# Patient Record
Sex: Female | Born: 1985
Health system: Southern US, Community
[De-identification: ages and names within clinical notes are randomized; demographics above are authoritative.]

## PROBLEM LIST (undated history)

## (undated) DIAGNOSIS — Z975 Presence of (intrauterine) contraceptive device: Secondary | ICD-10-CM

## (undated) DIAGNOSIS — F329 Major depressive disorder, single episode, unspecified: Secondary | ICD-10-CM

## (undated) DIAGNOSIS — M25561 Pain in right knee: Secondary | ICD-10-CM

## (undated) DIAGNOSIS — T7840XA Allergy, unspecified, initial encounter: Secondary | ICD-10-CM

## (undated) DIAGNOSIS — T4145XA Adverse effect of unspecified anesthetic, initial encounter: Secondary | ICD-10-CM

## (undated) DIAGNOSIS — K5120 Ulcerative (chronic) proctitis without complications: Secondary | ICD-10-CM

## (undated) DIAGNOSIS — T8859XA Other complications of anesthesia, initial encounter: Secondary | ICD-10-CM

## (undated) DIAGNOSIS — T8489XA Other specified complication of internal orthopedic prosthetic devices, implants and grafts, initial encounter: Secondary | ICD-10-CM

## (undated) DIAGNOSIS — Z8489 Family history of other specified conditions: Secondary | ICD-10-CM

## (undated) DIAGNOSIS — G43909 Migraine, unspecified, not intractable, without status migrainosus: Secondary | ICD-10-CM

## (undated) DIAGNOSIS — F32A Depression, unspecified: Secondary | ICD-10-CM

## (undated) DIAGNOSIS — F419 Anxiety disorder, unspecified: Secondary | ICD-10-CM

## (undated) DIAGNOSIS — T783XXA Angioneurotic edema, initial encounter: Secondary | ICD-10-CM

## (undated) DIAGNOSIS — G478 Other sleep disorders: Secondary | ICD-10-CM

## (undated) HISTORY — DX: Ulcerative (chronic) proctitis without complications: K51.20

## (undated) HISTORY — DX: Migraine, unspecified, not intractable, without status migrainosus: G43.909

## (undated) HISTORY — PX: OTHER SURGICAL HISTORY: SHX169

## (undated) HISTORY — DX: Major depressive disorder, single episode, unspecified: F32.9

## (undated) HISTORY — DX: Anxiety disorder, unspecified: F41.9

## (undated) HISTORY — DX: Angioneurotic edema, initial encounter: T78.3XXA

## (undated) HISTORY — PX: KNEE ARTHROSCOPY W/ ACL RECONSTRUCTION: SHX1858

## (undated) HISTORY — PX: SPINAL FUSION: SHX223

## (undated) HISTORY — DX: Depression, unspecified: F32.A

## (undated) HISTORY — DX: Allergy, unspecified, initial encounter: T78.40XA

---

## 2004-02-27 HISTORY — PX: KNEE ARTHROSCOPY W/ ACL RECONSTRUCTION: SHX1858

## 2005-05-06 ENCOUNTER — Emergency Department: Payer: Self-pay | Admitting: General Practice

## 2010-05-15 ENCOUNTER — Ambulatory Visit (INDEPENDENT_AMBULATORY_CARE_PROVIDER_SITE_OTHER): Payer: Self-pay | Admitting: Internal Medicine

## 2010-05-15 ENCOUNTER — Encounter: Payer: Self-pay | Admitting: Internal Medicine

## 2010-05-15 DIAGNOSIS — G43909 Migraine, unspecified, not intractable, without status migrainosus: Secondary | ICD-10-CM

## 2010-05-15 DIAGNOSIS — B3731 Acute candidiasis of vulva and vagina: Secondary | ICD-10-CM

## 2010-05-15 DIAGNOSIS — B373 Candidiasis of vulva and vagina: Secondary | ICD-10-CM

## 2010-05-15 DIAGNOSIS — J329 Chronic sinusitis, unspecified: Secondary | ICD-10-CM

## 2010-05-15 MED ORDER — METHYLPREDNISOLONE (PAK) 4 MG PO TABS: 4.0000 mg | ORAL_TABLET | Freq: Every day | ORAL | Status: DC

## 2010-05-15 MED ORDER — FLUCONAZOLE 100 MG PO TABS: 100.0000 mg | ORAL_TABLET | Freq: Every day | ORAL | Status: AC

## 2010-05-15 MED ORDER — BROMPHENIRAMINE-PSEUDOEPH 4-60 MG PO TABS: 1.0000 | ORAL_TABLET | Freq: Four times a day (QID) | ORAL | Status: AC | PRN

## 2010-05-15 MED ORDER — LEVOFLOXACIN 500 MG PO TABS: 500.0000 mg | ORAL_TABLET | Freq: Every day | ORAL | Status: AC

## 2010-05-15 NOTE — Progress Notes (Signed)
  Subjective:    Patient ID: [name], female    DOB: 1985/09/26, 25 y.o.   MRN: [id number]  Sinusitis This is a chronic problem. The current episode started more than 1 month ago. The problem has been gradually worsening since onset. The maximum temperature recorded prior to her arrival was 100 - 100.9 F. The fever has been present for 5 days or more. Her pain is at a severity of 8/10. The pain is moderate. Associated symptoms include congestion, coughing, ear pain, headaches, a hoarse voice, neck pain, shortness of breath, sinus pressure, a sore throat and swollen glands. Past treatments include antibiotics, oral decongestants and saline sprays. The treatment provided mild relief.      Review of Systems  Constitutional: Positive for fever and fatigue.  HENT: Positive for ear pain, congestion, sore throat, hoarse voice, neck pain, sinus pressure and tinnitus.   Eyes: Negative.   Respiratory: Positive for cough and shortness of breath.   Musculoskeletal: Negative for myalgias and arthralgias.  Neurological: Positive for headaches.  Hematological: Positive for adenopathy. Does not bruise/bleed easily.      Past Medical History  Diagnosis Date  . Headache   . Allergy   . Migraine headache    Past Surgical History  Procedure Date  . Knee arthroscopy w/ acl reconstruction     reports that she has never smoked. She does not have any smokeless tobacco history on file. She reports that she drinks alcohol. She reports that she does not use illicit drugs. family history includes Goiter in her father. No Known Allergies   Objective:   Physical Exam  Nursing note and vitals reviewed. HENT:  Head: Normocephalic and atraumatic.        The patient's turbinates are purple and swollen she has marked airflow obstruction bilaterally posterior pharynx shows redness and cobblestoning there is marked erythema or drainage occurs in the back of the throat is moderate adenopathy  Eyes:  Conjunctivae are normal. Pupils are equal, round, and reactive to light.  Neck: Normal range of motion.  Cardiovascular: Normal rate, regular rhythm and normal heart sounds.   Pulmonary/Chest: Effort normal and breath sounds normal.          Assessment & Plan:   I believe she has a complication of chronic sinusitis with also chronic allergic rhinitis we will have to treat this with triple therapy.  I believe she should be on an antibiotic for 14 days a different class and she's been on before apparently she has been on the penicillin class twice and should be on a broader spectrum so we will use Levaquin 500 mg by mouth daily for 14 days we will pare this with a steroid burst and taper she'll have a shot of Depo-Medrol today and we will repair this with a powerfull deconjestant tool

## 2010-05-16 ENCOUNTER — Telehealth: Payer: Self-pay | Admitting: Internal Medicine

## 2010-05-16 NOTE — Telephone Encounter (Signed)
Pharmacy called and said that Bromfed 4/60 mg is no longer made by manufacter, have the syrup but it is the DM.   Pls advise.

## 2010-05-16 NOTE — Assessment & Plan Note (Signed)
She in controlled on sumatriptan, she uses between 3-4 a month and the patter has been stable. She cannot related the pattern to her menses. The current flair may be related to the URI The medrol dose pack may aleiviate this flair

## 2010-05-16 NOTE — Telephone Encounter (Signed)
Use allegra d-pharmacy informed

## 2010-08-23 ENCOUNTER — Encounter: Payer: Self-pay | Admitting: Internal Medicine

## 2010-08-23 ENCOUNTER — Ambulatory Visit (INDEPENDENT_AMBULATORY_CARE_PROVIDER_SITE_OTHER): Payer: BC Managed Care – PPO | Admitting: Internal Medicine

## 2010-08-23 VITALS — BP 120/74 | HR 68 | Temp 98.2°F | Resp 14 | Ht 64.0 in | Wt 126.0 lb

## 2010-08-23 DIAGNOSIS — G5600 Carpal tunnel syndrome, unspecified upper limb: Secondary | ICD-10-CM

## 2010-08-23 DIAGNOSIS — G43909 Migraine, unspecified, not intractable, without status migrainosus: Secondary | ICD-10-CM

## 2010-08-23 DIAGNOSIS — G5603 Carpal tunnel syndrome, bilateral upper limbs: Secondary | ICD-10-CM

## 2010-08-23 DIAGNOSIS — R252 Cramp and spasm: Secondary | ICD-10-CM

## 2010-08-23 DIAGNOSIS — R5381 Other malaise: Secondary | ICD-10-CM

## 2010-08-23 DIAGNOSIS — R5383 Other fatigue: Secondary | ICD-10-CM

## 2010-08-23 DIAGNOSIS — M542 Cervicalgia: Secondary | ICD-10-CM

## 2010-08-23 LAB — CBC WITH DIFFERENTIAL/PLATELET
Basophils Absolute: 0 10*3/uL (ref 0.0–0.1)
Basophils Relative: 0.2 % (ref 0.0–3.0)
Eosinophils Absolute: 0.1 10*3/uL (ref 0.0–0.7)
Eosinophils Relative: 0.8 % (ref 0.0–5.0)
HCT: 37.5 % (ref 36.0–46.0)
Hemoglobin: 12.6 g/dL (ref 12.0–15.0)
Lymphocytes Relative: 19.2 % (ref 12.0–46.0)
Lymphs Abs: 1.7 10*3/uL (ref 0.7–4.0)
MCHC: 33.7 g/dL (ref 30.0–36.0)
MCV: 93.1 fl (ref 78.0–100.0)
Monocytes Absolute: 0.8 10*3/uL (ref 0.1–1.0)
Monocytes Relative: 9.3 % (ref 3.0–12.0)
Neutro Abs: 6.4 10*3/uL (ref 1.4–7.7)
Neutrophils Relative %: 70.5 % (ref 43.0–77.0)
Platelets: 300 10*3/uL (ref 150.0–400.0)
RBC: 4.03 Mil/uL (ref 3.87–5.11)
RDW: 12.6 % (ref 11.5–14.6)
WBC: 9.1 10*3/uL (ref 4.5–10.5)

## 2010-08-23 LAB — TSH: TSH: 1.01 u[IU]/mL (ref 0.35–5.50)

## 2010-08-23 MED ORDER — CYCLOBENZAPRINE HCL 10 MG PO TABS: 10.0000 mg | ORAL_TABLET | Freq: Three times a day (TID) | ORAL | Status: AC | PRN

## 2010-08-23 MED ORDER — SUMATRIPTAN SUCCINATE 100 MG PO TABS: 100.0000 mg | ORAL_TABLET | ORAL | Status: DC | PRN

## 2010-08-23 NOTE — Progress Notes (Signed)
Subjective:    Patient ID: [name], female    DOB: 1985-10-07, 25 y.o.   MRN: [id number]  HPI  The patient is a new patient who presented with acute migraines was treated with a prednisone Dosepak and did well her migraine pattern has been relatively stable on Imitrex.  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  A second unrelated problem is carpal tunnel on the right this has been a chronic problem for which she is seeing a chiropractor in the past his 1021 treatments to the wrist these treatments have not helped she has never worn a cockup wrist brace she's never been evaluated by EMG and nerve conduction velocity. A third problem involves extreme fatigue this may be related to her lack of exercise or working 2 jobs and stress but we will monitor a TSH and a CBC today she is demonstrating female it could have anemia  Review of Systems  Constitutional: Negative for activity change, appetite change and fatigue.  HENT: Negative for ear pain, congestion, neck pain, postnasal drip and sinus pressure.   Eyes: Negative for redness and visual disturbance.  Respiratory: Negative for cough, shortness of breath and wheezing.   Gastrointestinal: Negative for abdominal pain and abdominal distention.  Genitourinary: Negative for dysuria, frequency and menstrual problem.  Musculoskeletal: Negative for myalgias, joint swelling and arthralgias.  Skin: Negative for rash and wound.  Neurological: Negative for dizziness, weakness and headaches.  Hematological: Negative for adenopathy. Does not bruise/bleed easily.  Psychiatric/Behavioral: Negative for sleep disturbance and decreased concentration.   Past Medical  History  Diagnosis Date  . Headache   . Allergy   . Migraine headache    Past Surgical History  Procedure Date  . Knee arthroscopy w/ acl reconstruction     reports that she has never smoked. She does not have any smokeless tobacco history on file. She reports that she drinks alcohol. She reports that she does not use illicit drugs. family history includes Goiter in her father. No Known Allergies     Objective:   Physical Exam  Constitutional: She is oriented to person, place, and time. She appears well-developed and well-nourished. No distress.  HENT:  Head: Normocephalic and atraumatic.  Right Ear: External ear normal.  Left Ear: External ear normal.  Nose: Nose normal.  Mouth/Throat: Oropharynx is clear and moist.  Eyes: Conjunctivae and EOM are normal. Pupils are equal, round, and reactive to light.  Neck: Normal range of motion. Neck supple. No JVD present. No tracheal deviation present. No thyromegaly present.  Cardiovascular: Normal rate, regular rhythm, normal heart sounds and intact distal pulses.   No murmur heard. Pulmonary/Chest: Effort normal and breath sounds normal. She has no wheezes. She exhibits no tenderness.  Abdominal: Soft. Bowel sounds are normal.  Musculoskeletal: She exhibits no edema and no tenderness.       Tenderness in the right wrist area with positive Tinel's  Lymphadenopathy:    She has no cervical adenopathy.  Neurological: She is alert and oriented to person, place, and time. She has normal reflexes. No cranial nerve deficit.  Skin: Skin is warm and dry. She is not diaphoretic.  Psychiatric: She has a  normal mood and affect. Her behavior is normal.          Assessment & Plan:   Probable right carpal tunnel we'll refer for EMG and nerve conduction velocity have ordered a right cockup wrist brace.  Treatment options include wrist brace at night consideration for therapeutic injection and referral for surgical evaluation.  These were  discussed with the patient in detail the patient was given a muscle relaxant for her neck and prescribed exercises.  Patient's migraine medications were refilled a CBC and TSH will be drawn today for a workup of fatigue

## 2010-11-27 ENCOUNTER — Ambulatory Visit: Payer: BC Managed Care – PPO | Admitting: Internal Medicine

## 2011-01-30 ENCOUNTER — Telehealth: Payer: Self-pay | Admitting: *Deleted

## 2011-01-30 ENCOUNTER — Other Ambulatory Visit: Payer: Self-pay | Admitting: Internal Medicine

## 2011-01-30 MED ORDER — LEVOFLOXACIN 500 MG PO TABS: 500.0000 mg | ORAL_TABLET | Freq: Every day | ORAL | Status: AC

## 2011-01-30 NOTE — Telephone Encounter (Signed)
Pt is aware med call into pharm. Pt home # updated

## 2011-01-30 NOTE — Telephone Encounter (Signed)
Pt unable to come in due to sprain ankle. Pt was seen in march 2012. Pt has same symptoms chest congestion,cough and ear discomfort. Pt would like levaquin 580m call into [facility] [phone]

## 2011-01-30 NOTE — Telephone Encounter (Signed)
Per dr Lovell Sheehan- may have levaquin 500 qd for 7 days-meds sent in and pt informed

## 2011-01-30 NOTE — Telephone Encounter (Signed)
Unable to contact pt to tell her levaquil had been called in- number we have for her was the wrong number

## 2011-03-09 ENCOUNTER — Telehealth: Payer: Self-pay | Admitting: Internal Medicine

## 2011-03-09 NOTE — Telephone Encounter (Signed)
Pt stated that she had an issue at the pharmacy for her Imitrex that she picked up. She went on a trip and did not realized that her bottle was empty and was never filled. Please advise patient and return call to [phone]. Thanks.    [facility] ----[name].

## 2011-03-12 ENCOUNTER — Other Ambulatory Visit: Payer: Self-pay | Admitting: *Deleted

## 2011-03-12 MED ORDER — SUMATRIPTAN SUCCINATE 100 MG PO TABS: 100.0000 mg | ORAL_TABLET | ORAL | Status: DC | PRN

## 2011-03-12 NOTE — Telephone Encounter (Signed)
Needed refilll-sent in

## 2014-07-23 ENCOUNTER — Other Ambulatory Visit: Payer: Self-pay | Admitting: Orthopedic Surgery

## 2014-07-30 ENCOUNTER — Encounter (HOSPITAL_BASED_OUTPATIENT_CLINIC_OR_DEPARTMENT_OTHER): Payer: Self-pay | Admitting: *Deleted

## 2014-08-02 ENCOUNTER — Encounter (HOSPITAL_BASED_OUTPATIENT_CLINIC_OR_DEPARTMENT_OTHER): Payer: Self-pay | Admitting: *Deleted

## 2014-08-02 NOTE — Progress Notes (Signed)
Pt instructed npo pmn 6/13.  To [facility] 6/14 @ 0945.  Needs hgb, urine hcg on arrival.

## 2014-08-10 ENCOUNTER — Encounter (HOSPITAL_BASED_OUTPATIENT_CLINIC_OR_DEPARTMENT_OTHER)
Admission: RE | Disposition: A | Discharge status: Home / Self Care | Payer: Self-pay | Source: Ambulatory Visit | Attending: Specialist

## 2014-08-10 ENCOUNTER — Ambulatory Visit (HOSPITAL_BASED_OUTPATIENT_CLINIC_OR_DEPARTMENT_OTHER)
Admission: RE | Admit: 2014-08-10 | Discharge: 2014-08-10 | Disposition: A | Discharge status: Home / Self Care | Payer: 59 | Source: Ambulatory Visit | Attending: Specialist | Admitting: Specialist

## 2014-08-10 ENCOUNTER — Encounter (HOSPITAL_BASED_OUTPATIENT_CLINIC_OR_DEPARTMENT_OTHER): Payer: Self-pay | Admitting: Anesthesiology

## 2014-08-10 ENCOUNTER — Ambulatory Visit (HOSPITAL_BASED_OUTPATIENT_CLINIC_OR_DEPARTMENT_OTHER): Payer: 59 | Admitting: Anesthesiology

## 2014-08-10 DIAGNOSIS — T8589XA Other specified complication of internal prosthetic devices, implants and grafts, not elsewhere classified, initial encounter: Secondary | ICD-10-CM | POA: Diagnosis not present

## 2014-08-10 DIAGNOSIS — G43909 Migraine, unspecified, not intractable, without status migrainosus: Secondary | ICD-10-CM | POA: Diagnosis not present

## 2014-08-10 DIAGNOSIS — Y832 Surgical operation with anastomosis, bypass or graft as the cause of abnormal reaction of the patient, or of later complication, without mention of misadventure at the time of the procedure: Secondary | ICD-10-CM | POA: Diagnosis not present

## 2014-08-10 DIAGNOSIS — Y92234 Operating room of hospital as the place of occurrence of the external cause: Secondary | ICD-10-CM | POA: Diagnosis not present

## 2014-08-10 DIAGNOSIS — Z9889 Other specified postprocedural states: Secondary | ICD-10-CM

## 2014-08-10 DIAGNOSIS — T8489XA Other specified complication of internal orthopedic prosthetic devices, implants and grafts, initial encounter: Secondary | ICD-10-CM

## 2014-08-10 HISTORY — DX: Other complications of anesthesia, initial encounter: T88.59XA

## 2014-08-10 HISTORY — DX: Other specified complication of internal orthopedic prosthetic devices, implants and grafts, initial encounter: T84.89XA

## 2014-08-10 HISTORY — DX: Adverse effect of unspecified anesthetic, initial encounter: T41.45XA

## 2014-08-10 HISTORY — DX: Family history of other specified conditions: Z84.89

## 2014-08-10 HISTORY — DX: Other sleep disorders: G47.8

## 2014-08-10 HISTORY — PX: KNEE ARTHROSCOPY: SHX127

## 2014-08-10 HISTORY — DX: Pain in right knee: M25.561

## 2014-08-10 HISTORY — DX: Presence of (intrauterine) contraceptive device: Z97.5

## 2014-08-10 HISTORY — PX: ANTERIOR CRUCIATE LIGAMENT REPAIR: SHX115

## 2014-08-10 LAB — POCT HEMOGLOBIN-HEMACUE: Hemoglobin: 13 g/dL (ref 12.0–15.0)

## 2014-08-10 LAB — POCT PREGNANCY, URINE: Preg Test, Ur: NEGATIVE

## 2014-08-10 SURGERY — ARTHROSCOPY, KNEE
Anesthesia: Regional | Site: Knee | Laterality: Left

## 2014-08-10 MED ORDER — PROPOFOL 10 MG/ML IV BOLUS
INTRAVENOUS | Status: DC | PRN
  Administered 2014-08-10: 150 mg via INTRAVENOUS

## 2014-08-10 MED ORDER — ACETAMINOPHEN 10 MG/ML IV SOLN
INTRAVENOUS | Status: DC | PRN
  Administered 2014-08-10: 1000 mg via INTRAVENOUS

## 2014-08-10 MED ORDER — FENTANYL CITRATE (PF) 100 MCG/2ML IJ SOLN
INTRAMUSCULAR | Status: DC | PRN
  Administered 2014-08-10 (×8): 25 ug via INTRAVENOUS

## 2014-08-10 MED ORDER — MIDAZOLAM HCL 2 MG/2ML IJ SOLN
INTRAMUSCULAR | Status: AC
  Filled 2014-08-10: qty 2

## 2014-08-10 MED ORDER — ASPIRIN EC 325 MG PO TBEC: 325.0000 mg | DELAYED_RELEASE_TABLET | Freq: Two times a day (BID) | ORAL | Status: DC

## 2014-08-10 MED ORDER — OXYCODONE-ACETAMINOPHEN 5-325 MG PO TABS: 1.0000 | ORAL_TABLET | ORAL | Status: DC | PRN

## 2014-08-10 MED ORDER — HYDROMORPHONE HCL 1 MG/ML IJ SOLN
INTRAMUSCULAR | Status: AC
  Filled 2014-08-10: qty 1

## 2014-08-10 MED ORDER — FENTANYL CITRATE (PF) 100 MCG/2ML IJ SOLN
INTRAMUSCULAR | Status: AC
  Filled 2014-08-10: qty 6

## 2014-08-10 MED ORDER — MIDAZOLAM HCL 5 MG/5ML IJ SOLN
INTRAMUSCULAR | Status: DC | PRN
  Administered 2014-08-10 (×2): 1 mg via INTRAVENOUS

## 2014-08-10 MED ORDER — CEFAZOLIN SODIUM-DEXTROSE 2-3 GM-% IV SOLR
2.0000 g | INTRAVENOUS | Status: AC
  Administered 2014-08-10: 2 g via INTRAVENOUS
  Filled 2014-08-10: qty 50

## 2014-08-10 MED ORDER — FENTANYL CITRATE (PF) 100 MCG/2ML IJ SOLN
100.0000 ug | Freq: Once | INTRAMUSCULAR | Status: AC
  Administered 2014-08-10: 100 ug via INTRAVENOUS
  Filled 2014-08-10: qty 2

## 2014-08-10 MED ORDER — MIDAZOLAM HCL 2 MG/2ML IJ SOLN
INTRAMUSCULAR | Status: AC
  Filled 2014-08-10: qty 4

## 2014-08-10 MED ORDER — MORPHINE SULFATE 4 MG/ML IJ SOLN
INTRAMUSCULAR | Status: AC
  Filled 2014-08-10: qty 1

## 2014-08-10 MED ORDER — METHOCARBAMOL 500 MG PO TABS: 500.0000 mg | ORAL_TABLET | Freq: Four times a day (QID) | ORAL | Status: DC | PRN

## 2014-08-10 MED ORDER — FENTANYL CITRATE (PF) 100 MCG/2ML IJ SOLN
INTRAMUSCULAR | Status: AC
  Filled 2014-08-10: qty 2

## 2014-08-10 MED ORDER — SODIUM CHLORIDE 0.9 % IR SOLN
Status: DC | PRN
  Administered 2014-08-10: 6000 mL
  Administered 2014-08-10: 3000 mL
  Administered 2014-08-10: 6000 mL
  Administered 2014-08-10: 3000 mL
  Administered 2014-08-10: 6000 mL
  Administered 2014-08-10: 500 mL

## 2014-08-10 MED ORDER — POVIDONE-IODINE 7.5 % EX SOLN
Freq: Once | CUTANEOUS | Status: DC
  Filled 2014-08-10: qty 118

## 2014-08-10 MED ORDER — KETOROLAC TROMETHAMINE 30 MG/ML IJ SOLN
INTRAMUSCULAR | Status: DC | PRN
  Administered 2014-08-10: 30 mg via INTRAVENOUS

## 2014-08-10 MED ORDER — OXYCODONE HCL 5 MG PO TABS
ORAL_TABLET | ORAL | Status: AC
  Filled 2014-08-10: qty 1

## 2014-08-10 MED ORDER — CEFAZOLIN SODIUM-DEXTROSE 2-3 GM-% IV SOLR
INTRAVENOUS | Status: AC
  Filled 2014-08-10: qty 50

## 2014-08-10 MED ORDER — CEPHALEXIN 500 MG PO CAPS: 500.0000 mg | ORAL_CAPSULE | Freq: Three times a day (TID) | ORAL | Status: DC

## 2014-08-10 MED ORDER — LACTATED RINGERS IV SOLN
INTRAVENOUS | Status: DC
  Administered 2014-08-10 (×4): via INTRAVENOUS
  Filled 2014-08-10: qty 1000

## 2014-08-10 MED ORDER — BUPIVACAINE HCL 0.25 % IJ SOLN
INTRAMUSCULAR | Status: DC | PRN
  Administered 2014-08-10: 20 mL
  Administered 2014-08-10: 10 mL

## 2014-08-10 MED ORDER — DEXAMETHASONE SODIUM PHOSPHATE 10 MG/ML IJ SOLN
INTRAMUSCULAR | Status: DC | PRN
  Administered 2014-08-10: 10 mg via INTRAVENOUS

## 2014-08-10 MED ORDER — HYDROMORPHONE HCL 1 MG/ML IJ SOLN
0.2500 mg | INTRAMUSCULAR | Status: DC | PRN
  Administered 2014-08-10: 0.25 mg via INTRAVENOUS
  Administered 2014-08-10: 0.5 mg via INTRAVENOUS
  Administered 2014-08-10: 0.25 mg via INTRAVENOUS
  Filled 2014-08-10: qty 1

## 2014-08-10 MED ORDER — MIDAZOLAM HCL 2 MG/2ML IJ SOLN
2.0000 mg | Freq: Once | INTRAMUSCULAR | Status: AC
  Administered 2014-08-10: 2 mg via INTRAVENOUS
  Filled 2014-08-10: qty 2

## 2014-08-10 MED ORDER — ROPIVACAINE HCL 5 MG/ML IJ SOLN
INTRAMUSCULAR | Status: DC | PRN
  Administered 2014-08-10: 25 mL via PERINEURAL

## 2014-08-10 MED ORDER — LIDOCAINE HCL (CARDIAC) 20 MG/ML IV SOLN
INTRAVENOUS | Status: DC | PRN
  Administered 2014-08-10: 60 mg via INTRAVENOUS

## 2014-08-10 MED ORDER — MORPHINE SULFATE 4 MG/ML IJ SOLN
INTRAMUSCULAR | Status: DC | PRN
  Administered 2014-08-10: 4 mg via SUBCUTANEOUS

## 2014-08-10 MED ORDER — STERILE WATER FOR IRRIGATION IR SOLN
Status: DC | PRN
  Administered 2014-08-10: 500 mL

## 2014-08-10 MED ORDER — ONDANSETRON HCL 4 MG/2ML IJ SOLN
INTRAMUSCULAR | Status: DC | PRN
  Administered 2014-08-10: 4 mg via INTRAVENOUS

## 2014-08-10 MED ORDER — OXYCODONE HCL 5 MG PO TABS
5.0000 mg | ORAL_TABLET | Freq: Once | ORAL | Status: AC
Start: 2014-08-10 — End: 2014-08-10
  Administered 2014-08-10: 5 mg via ORAL
  Filled 2014-08-10: qty 1

## 2014-08-10 SURGICAL SUPPLY — 74 items
ALLOGRAFT GRAFTLINK CONVENIENCE PACK ×2 IMPLANT
ANCHOR BUTTON TIGHTROPE BTB (Anchor) ×2 IMPLANT
ANCHOR BUTTON TIGHTROPE RN 14 (Anchor) ×2 IMPLANT
ANCHOR PUSHLOCK PEEK 3.5X19.5 (Anchor) ×2 IMPLANT
BANDAGE ESMARK 6X9 LF (GAUZE/BANDAGES/DRESSINGS) ×1 IMPLANT
BLADE 4.2CUDA (BLADE) IMPLANT
BLADE CUDA 5.5 (BLADE) ×2 IMPLANT
BLADE CUDA GRT WHITE 3.5 (BLADE) ×2 IMPLANT
BLADE GREAT WHITE 4.2 (BLADE) IMPLANT
BLADE SURG 10 STRL SS (BLADE) ×2 IMPLANT
BLADE SURG 15 STRL LF DISP TIS (BLADE) ×1 IMPLANT
BLADE SURG 15 STRL SS (BLADE) ×2
BNDG ESMARK 6X9 LF (GAUZE/BANDAGES/DRESSINGS) ×2
BNDG GAUZE ELAST 4 BULKY (GAUZE/BANDAGES/DRESSINGS) ×2 IMPLANT
BONE MATRIX DEMINERALIZED 1CC (Bone Implant) ×8 IMPLANT
BUR OVAL 6.0 (BURR) IMPLANT
BUR VERTEX HOODED 4.5 (BURR) ×2 IMPLANT
CANISTER SUCT LVC 12 LTR MEDI- (MISCELLANEOUS) IMPLANT
CANISTER SUCTION 2500CC (MISCELLANEOUS) IMPLANT
CLOTH BEACON ORANGE TIMEOUT ST (SAFETY) ×2 IMPLANT
COVER BACK TABLE 60X90IN (DRAPES) ×2 IMPLANT
CUFF TOURNIQUET SINGLE 34IN LL (TOURNIQUET CUFF) ×2 IMPLANT
CUTTER FLIP 10.5 (CUTTER) ×2 IMPLANT
DRAPE ARTHROSCOPY W/POUCH 114 (DRAPES) ×2 IMPLANT
DRAPE INCISE IOBAN 66X45 STRL (DRAPES) ×2 IMPLANT
DRAPE LG THREE QUARTER DISP (DRAPES) ×4 IMPLANT
DRAPE OEC MINIVIEW 54X84 (DRAPES) ×2 IMPLANT
DRAPE U-SHAPE 47X51 STRL (DRAPES) ×2 IMPLANT
DRSG PAD ABDOMINAL 8X10 ST (GAUZE/BANDAGES/DRESSINGS) ×2 IMPLANT
DURAPREP 26ML APPLICATOR (WOUND CARE) ×2 IMPLANT
ELECT REM PT RETURN 9FT ADLT (ELECTROSURGICAL) ×2
ELECTRODE REM PT RTRN 9FT ADLT (ELECTROSURGICAL) ×1 IMPLANT
GAUZE XEROFORM 1X8 LF (GAUZE/BANDAGES/DRESSINGS) ×2 IMPLANT
GLOVE BIO SURGEON STRL SZ7.5 (GLOVE) ×2 IMPLANT
GLOVE BIO SURGEON STRL SZ8 (GLOVE) ×4 IMPLANT
GLOVE INDICATOR 8.0 STRL GRN (GLOVE) ×4 IMPLANT
GOWN STRL REUS W/ TWL LRG LVL3 (GOWN DISPOSABLE) IMPLANT
GOWN STRL REUS W/ TWL XL LVL3 (GOWN DISPOSABLE) ×2 IMPLANT
GOWN STRL REUS W/TWL LRG LVL3 (GOWN DISPOSABLE) ×2 IMPLANT
GOWN STRL REUS W/TWL XL LVL3 (GOWN DISPOSABLE) ×4
IV NS IRRIG 3000ML ARTHROMATIC (IV SOLUTION) ×16 IMPLANT
KIT BIOCARTILAGE DEL W/SYRINGE (KITS) ×2 IMPLANT
KNEE WRAP E Z 3 GEL PACK (MISCELLANEOUS) ×2 IMPLANT
MANIFOLD NEPTUNE II (INSTRUMENTS) ×2 IMPLANT
MIXING AND DELIVERY SYSTEM ×2 IMPLANT
NEEDLE HYPO 22GX1.5 SAFETY (NEEDLE) ×2 IMPLANT
PACK ARTHROSCOPY DSU (CUSTOM PROCEDURE TRAY) ×2 IMPLANT
PACK BASIN DAY SURGERY FS (CUSTOM PROCEDURE TRAY) ×2 IMPLANT
PAD ABD 8X10 STRL (GAUZE/BANDAGES/DRESSINGS) ×4 IMPLANT
PAD ARMBOARD 7.5X6 YLW CONV (MISCELLANEOUS) ×6 IMPLANT
PADDING CAST ABS 4INX4YD NS (CAST SUPPLIES)
PADDING CAST ABS COTTON 4X4 ST (CAST SUPPLIES) IMPLANT
PENCIL BUTTON HOLSTER BLD 10FT (ELECTRODE) ×2 IMPLANT
SET ARTHROSCOPY TUBING (MISCELLANEOUS) ×2
SET ARTHROSCOPY TUBING LN (MISCELLANEOUS) ×1 IMPLANT
SET PAD KNEE POSITIONER (MISCELLANEOUS) ×2 IMPLANT
SPONGE GAUZE 4X4 12PLY (GAUZE/BANDAGES/DRESSINGS) ×2 IMPLANT
SPONGE GAUZE 4X4 12PLY STER LF (GAUZE/BANDAGES/DRESSINGS) ×2 IMPLANT
SPONGE LAP 4X18 X RAY DECT (DISPOSABLE) ×2 IMPLANT
STRIP CLOSURE SKIN 1/2X4 (GAUZE/BANDAGES/DRESSINGS) IMPLANT
SUT ETHILON 4 0 PS 2 18 (SUTURE) ×4 IMPLANT
SUT MNCRL AB 3-0 PS2 18 (SUTURE) IMPLANT
SUT VIC AB 0 CT1 36 (SUTURE) IMPLANT
SUT VIC AB 0 CT2 27 (SUTURE) IMPLANT
SUT VIC AB 2-0 CT1 27 (SUTURE) ×2
SUT VIC AB 2-0 CT1 TAPERPNT 27 (SUTURE) ×1 IMPLANT
SUT VIC AB 2-0 PS2 27 (SUTURE) ×4 IMPLANT
SYR CONTROL 10ML LL (SYRINGE) ×2 IMPLANT
TISSUE GRAFTLINK FGL (Tissue) ×2 IMPLANT
TOWEL OR 17X24 6PK STRL BLUE (TOWEL DISPOSABLE) ×4 IMPLANT
TUBE CONNECTING 12X1/4 (SUCTIONS) ×2 IMPLANT
WAND 30 DEG SABER W/CORD (SURGICAL WAND) IMPLANT
WAND 90 DEG TURBOVAC W/CORD (SURGICAL WAND) IMPLANT
WATER STERILE IRR 500ML POUR (IV SOLUTION) ×2 IMPLANT

## 2014-08-10 NOTE — H&P (View-Only) (Signed)
Pt instructed npo pmn 6/13.  To [facility] 6/14 @ 0945.  Needs hgb, urine hcg on arrival.

## 2014-08-10 NOTE — Discharge Instructions (Signed)
° ° °  Regional Anesthesia Blocks ° °1. Numbness or the inability to move the "blocked" extremity may last from 3-48 hours after placement. The length of time depends on the medication injected and your individual response to the medication. If the numbness is not going away after 48 hours, call your surgeon. ° °2. The extremity that is blocked will need to be protected until the numbness is gone and the  Strength has returned. Because you cannot feel it, you will need to take extra care to avoid injury. Because it may be weak, you may have difficulty moving it or using it. You may not know what position it is in without looking at it while the block is in effect. ° °3. For blocks in the legs and feet, returning to weight bearing and walking needs to be done carefully. You will need to wait until the numbness is entirely gone and the strength has returned. You should be able to move your leg and foot normally before you try and bear weight or walk. You will need someone to be with you when you first try to ensure you do not fall and possibly risk injury. ° °4. Bruising and tenderness at the needle site are common side effects and will resolve in a few days. ° °5. Persistent numbness or new problems with movement should be communicated to the surgeon or the [facility] ([phone])/ [facility] ([phone]). ° ° ° °Post Anesthesia Home Care Instructions ° °Activity: °Get plenty of rest for the remainder of the day. A responsible adult should stay with you for 24 hours following the procedure.  °For the next 24 hours, DO NOT: °-Drive a car °-Operate machinery °-Drink alcoholic beverages °-Take any medication unless instructed by your physician °-Make any legal decisions or sign important papers. ° °Meals: °Start with liquid foods such as gelatin or soup. Progress to regular foods as tolerated. Avoid greasy, spicy, heavy foods. If nausea and/or vomiting occur, drink only clear liquids until  the nausea and/or vomiting subsides. Call your physician if vomiting continues. ° °Special Instructions/Symptoms: °Your throat may feel dry or sore from the anesthesia or the breathing tube placed in your throat during surgery. If this causes discomfort, gargle with warm salt water. The discomfort should disappear within 24 hours. ° °If you had a scopolamine patch placed behind your ear for the management of post- operative nausea and/or vomiting: ° °1. The medication in the patch is effective for 72 hours, after which it should be removed.  Wrap patch in a tissue and discard in the trash. Wash hands thoroughly with soap and water. °2. You may remove the patch earlier than 72 hours if you experience unpleasant side effects which may include dry mouth, dizziness or visual disturbances. °3. Avoid touching the patch. Wash your hands with soap and water after contact with the patch. °  ° °

## 2014-08-10 NOTE — Anesthesia Postprocedure Evaluation (Signed)
  Anesthesia Post-op Note  Patient: [name]  Procedure(s) Performed: Procedure(s) (LRB): ARTHROSCOPY KNEE DEBRIDMENT ALLOGRAFT ACL REVISION RECONSTRUCTION PARTIAL MENISCECTOMY VERSES REPAIR (Left) ANTERIOR CRUCIATE LIGAMENT (ACL) REPAIR (Left)  Patient Location: PACU  Anesthesia Type: General  Level of Consciousness: awake and alert   Airway and Oxygen Therapy: Patient Spontanous Breathing  Post-op Pain: mild  Post-op Assessment: Post-op Vital signs reviewed, Patient's Cardiovascular Status Stable, Respiratory Function Stable, Patent Airway and No signs of Nausea or vomiting  Last Vitals:  Filed Vitals:   08/10/14 1612  BP:   Pulse: 107  Temp:   Resp: 20    Post-op Vital Signs: stable   Complications: No apparent anesthesia complications

## 2014-08-10 NOTE — Anesthesia Procedure Notes (Addendum)
Anesthesia Regional Block:  Adductor canal block  Pre-Anesthetic Checklist: ,, timeout performed, Correct Patient, Correct Site, Correct Laterality, Correct Procedure, Correct Position, site marked, Risks and benefits discussed, pre-op evaluation,  At surgeon's request and post-op pain management  Laterality: Left  Prep: Maximum Sterile Barrier Precautions used and chloraprep       Needles:  Injection technique: Single-shot  Needle Type: Echogenic Stimulator Needle     Needle Length: 5cm 5 cm Needle Gauge: 22 and 22 G    Additional Needles:  Procedures: ultrasound guided (picture in chart) Adductor canal block  Nerve Stimulator or Paresthesia:  Response: Patellar respose,   Additional Responses:   Narrative:  Start time: 08/10/2014 12:49 PM End time: 08/10/2014 12:57 PM Injection made incrementally with aspirations every 5 mL. Anesthesiologist: Roderic Palau  Additional Notes: 2% Lidocaine skin wheel.    Procedure Name: LMA Insertion Date/Time: 08/10/2014 1:49 PM Performed by: Justice Rocher Pre-anesthesia Checklist: Patient identified, Emergency Drugs available, Suction available and Patient being monitored Patient Re-evaluated:Patient Re-evaluated prior to inductionOxygen Delivery Method: Circle System Utilized Preoxygenation: Pre-oxygenation with 100% oxygen Intubation Type: IV induction Ventilation: Mask ventilation without difficulty LMA: LMA inserted LMA Size: 4.0 Number of attempts: 1 Airway Equipment and Method: Bite block Placement Confirmation: positive ETCO2 Tube secured with: Tape Dental Injury: Teeth and Oropharynx as per pre-operative assessment

## 2014-08-10 NOTE — Op Note (Signed)
[phone]

## 2014-08-10 NOTE — Anesthesia Preprocedure Evaluation (Addendum)
Anesthesia Evaluation  Patient identified by MRN, date of birth, ID band Patient awake    Reviewed: Allergy & Precautions, H&P , NPO status , Patient's Chart, lab work & pertinent test results  Airway Mallampati: I  TM Distance: >3 FB Neck ROM: Full    Dental no notable dental hx. (+) Teeth Intact, Dental Advisory Given   Pulmonary neg pulmonary ROS,  breath sounds clear to auscultation  Pulmonary exam normal       Cardiovascular negative cardio ROS  Rhythm:Regular Rate:Normal     Neuro/Psych  Headaches, negative psych ROS   GI/Hepatic negative GI ROS, Neg liver ROS,   Endo/Other  negative endocrine ROS  Renal/GU negative Renal ROS  negative genitourinary   Musculoskeletal   Abdominal   Peds  Hematology negative hematology ROS (+)   Anesthesia Other Findings   Reproductive/Obstetrics negative OB ROS                            Anesthesia Physical Anesthesia Plan  ASA: II  Anesthesia Plan: General and Regional   Post-op Pain Management:    Induction: Intravenous  Airway Management Planned: LMA  Additional Equipment:   Intra-op Plan:   Post-operative Plan: Extubation in OR  Informed Consent: I have reviewed the patients History and Physical, chart, labs and discussed the procedure including the risks, benefits and alternatives for the proposed anesthesia with the patient or authorized representative who has indicated his/her understanding and acceptance.   Dental advisory given  Plan Discussed with: CRNA  Anesthesia Plan Comments:         Anesthesia Quick Evaluation

## 2014-08-10 NOTE — H&P (Signed)
[name] is an 29 y.o. female.   Chief Complaint: Left instability HPI: Patient presents with joint discomfort that had been persistent for several weeks now. Despite conservative treatments, her discomfort has not improved. Imaging was obtained. Other conservative and surgical treatments were discussed in detail. Patient wishes to proceed with surgery as consented. Denies SOB, CP, or calf pain. No Fever, chills, or nausea/ vomiting.   Past Medical History  Diagnosis Date  . Migraine headache   . ACL graft tear     left knee  . Complication of anesthesia     woke up during surgery  . Family history of adverse reaction to anesthesia      mom is difficult to put to sleep  . IUD (intrauterine device) in place   . Knee pain, right   . Sleep paralysis     Past Surgical History  Procedure Laterality Date  . Knee arthroscopy w/ acl reconstruction      Family History  Problem Relation Age of Onset  . Goiter Father    Social History:  reports that she has never smoked. She does not have any smokeless tobacco history on file. She reports that she drinks about 2.4 oz of alcohol per week. She reports that she does not use illicit drugs.  Allergies: No Known Allergies  Medications Prior to Admission  Medication Sig Dispense Refill  . beta carotene w/minerals (OCUVITE) tablet Take 1 tablet by mouth daily.    . clonazePAM (KLONOPIN) 0.5 MG tablet Take 0.5 mg by mouth 2 (two) times daily as needed for anxiety.    . cyclobenzaprine (FLEXERIL) 10 MG tablet Take 10 mg by mouth 3 (three) times daily as needed for muscle spasms.    . DULoxetine (CYMBALTA) 60 MG capsule Take 60 mg by mouth at bedtime.    . SUMAtriptan (IMITREX) 100 MG tablet Take 1 tablet (100 mg total) by mouth every 2 (two) hours as needed. 9 tablet 4  . feeding supplement (BOOST HIGH PROTEIN) LIQD Take 1 Container by mouth 3 (three) times daily.      Results for orders placed or performed during the hospital encounter of  08/10/14 (from the past 48 hour(s))  Pregnancy, urine POC     Status: None   Collection Time: 08/10/14 11:35 AM  Result Value Ref Range   Preg Test, Ur NEGATIVE NEGATIVE    Comment:        THE SENSITIVITY OF THIS METHODOLOGY IS >24 mIU/mL   Hemoglobin-hemacue, POC     Status: None   Collection Time: 08/10/14 12:12 PM  Result Value Ref Range   Hemoglobin 13.0 12.0 - 15.0 g/dL   No results found.  Review of Systems  Constitutional: Negative.   HENT: Negative.   Eyes: Negative.   Respiratory: Negative.   Gastrointestinal: Negative.   Genitourinary: Negative.   Musculoskeletal: Positive for joint pain.  Skin: Negative.   Neurological: Negative.   Endo/Heme/Allergies: Negative.   Psychiatric/Behavioral: Negative.     Blood pressure 131/87, pulse 79, temperature 98.7 F (37.1 C), temperature source Oral, resp. rate 16, height 5' 3.5" (1.613 m), weight 56.246 kg (124 lb), SpO2 100 %. Physical Exam  Constitutional: She is oriented to person, place, and time. She appears well-developed.  HENT:  Head: Normocephalic.  Eyes: EOM are normal.  Neck: Normal range of motion.  Cardiovascular: Normal rate, normal heart sounds and intact distal pulses.   Respiratory: Effort normal and breath sounds normal.  GI: Soft. Bowel sounds are normal.  Genitourinary:  Deferred  Musculoskeletal:  Left knee surgical scars well healed. Positive Lachmans. LLE grossly N/V intact.  Neurological: She is alert and oriented to person, place, and time.  Skin: Skin is warm and dry.  Psychiatric: Her behavior is normal.     Assessment/Plan Left knee torn ACL graft: Left ACL revision as consented D/c home today F/u in the office Follow instructions  Hermine Feria L 08/10/2014, 1:20 PM

## 2014-08-10 NOTE — Interval H&P Note (Signed)
History and Physical Interval Note:  08/10/2014 1:39 PM  [name]  has presented today for surgery, with the diagnosis of LEFT KNEE TORN ACL GRAFT  The various methods of treatment have been discussed with the patient and family. After consideration of risks, benefits and other options for treatment, the patient has consented to  Procedure(s): ARTHROSCOPY KNEE DEBRIDMENT ALLOGRAFT ACL REVISION RECONSTRUCTION PARTIAL MENISCECTOMY VERSES REPAIR (Left) ANTERIOR CRUCIATE LIGAMENT (ACL) REPAIR (Left) as a surgical intervention .  The patient's history has been reviewed, patient examined, no change in status, stable for surgery.  I have reviewed the patient's chart and labs.  Questions were answered to the patient's satisfaction.     Taylon Coole ANDREW

## 2014-08-10 NOTE — Transfer of Care (Signed)
Immediate Anesthesia Transfer of Care Note  Patient: [name]  Procedure(s) Performed: Procedure(s) (LRB): ARTHROSCOPY KNEE DEBRIDMENT ALLOGRAFT ACL REVISION RECONSTRUCTION PARTIAL MENISCECTOMY VERSES REPAIR (Left) ANTERIOR CRUCIATE LIGAMENT (ACL) REPAIR (Left)  Patient Location: PACU  Anesthesia Type: General  Level of Consciousness: awake, sedated, patient cooperative and responds to stimulation  Airway & Oxygen Therapy: Patient Spontanous Breathing and Patient connected to face mask oxygen  Post-op Assessment: Report given to PACU RN, Post -op Vital signs reviewed and stable and Patient moving all extremities  Post vital signs: Reviewed and stable  Complications: No apparent anesthesia complications

## 2014-08-11 NOTE — Op Note (Signed)
[name], [name]               ACCOUNT NO.:  [id number]  MEDICAL RECORD NO.:  [id number]  LOCATION:                                 FACILITY:  PHYSICIAN:  Cynda Familia, M.D.DATE OF BIRTH:  1986-01-25  DATE OF PROCEDURE:  08/10/2014 DATE OF DISCHARGE:  08/10/2014                              OPERATIVE REPORT   PREOPERATIVE DIAGNOSES:  Anterior cruciate ligament graft tear of left knee, anterior cruciate ligament deficiency, possible torn meniscus.  POSTOPERATIVE DIAGNOSES:  Complete rupture of left knee, anterior cruciate ligament graft, anterior cruciate ligament deficiency.  PROCEDURE:  Left knee arthroscopic allograft, revision anterior cruciate ligament reconstruction.  SURGEON:  Cynda Familia, M.D.  ASSISTANT:  Jerilynn Mages, PA-C.  ANESTHESIA:  Adductor canal block with general.  ESTIMATED BLOOD LOSS:  Minimal.  DRAINS:  None.  COMPLICATIONS:  None.  TOURNIQUET TIME:  95 minutes at 250 mmHg.  DISPOSITION:  To PACU, stable.  OPERATIVE DETAILS:  The patient was counseled in the holding area. Correct site was identified, marked, and signed appropriately.  Chart was reviewed.  On the way to the operating room, IV Ancef was given. Adductor canal block had been administered by the anesthesiologist.  In the OR, placed in supine position under general anesthesia.  All extremities were well padded and bumped.  Left lower extremity was examined, she had full range of motion.  PCL, posterolateral corner, and collateral ligaments were established.  She had a markedly positive Lachman's, anterior drawer, pivot shift.  She was then elevated.  She was prepped with DuraPrep and draped in a sterile fashion.  Time-out was done to confirm the left side.  Exsanguinated with Esmarch.  Tourniquet was inflated to 250 mmHg.  Arthroscopic portal was established, proximal medial, inferomedial and inferolateral diagnostic arthroscopy of the left ACL graft.  We made  the size appropriate Arthrex flap and allograft was chosen and prepared on the back table by Mr. Shannon Portela, PA-C.  The ACL fiber was debrided.  PCL was protected and notchplasty was performed in standard fashion.  The alternative was found to be superior and anterior.  We confirmed the overtop position on the posterior wall. Old sutures were removed from the previous graft.  Following notchplasty, the patellofemoral joint was inspected, articular cartilage was normal with normal tracking.  Suture passed and the latter gutter was unremarkable.  Medial side was inspected with normal articular cartilage and normal medial meniscus.  Lateral side was inspected, normal articular cartilage and normal lateral meniscus without frank tear.  With femoral guide to an overtop position, we have small stab wound laterally and Arthrex flip cutter with an anatomic ACL footprint, which was a different tunnel, socket, forehand and the retro-socket was performed and FiberWire suture was passed and placed.  The tunnel was then inspected and we had circumferential bony coverage, no compromise or problems.  Patella was rested and debris was removed.  Tibial guide was into the anatomic ACL footprint where ACL would normally be.  Small incision was made anteriorly, which was definitely in the previous incision.  The skin, subcutaneous tissue, periosteum and another retro socket was performed.  Debris was removed from the knee joint. FiberWire  suture was passed and placed on both tunnels.  I checked it and also the tunnel on both sides, and good bony coverage on both tunnels and good socket on both cortices.  At this point of time, tunnel was filled with Arthrex Simplex as an allograft bone filling matrix. The graft was then delivered into the knee joint.  We used size appropriate 10.5-mm graft with ACL tunnel on both sides.  The button was confirmed to be in overtop position both arthroscopically, visual  and palpable radiographically.  Following this, the graft was delivered to the knee joint.  We then put the graft to the tibial socket, 30 degrees of flexion and neutral rotation.  We securely fixed and tight both sides down simultaneously with a button on the tibial and PushLock anchor. Knee was put through range of motion and graft had excellent orientation and tension.  Knee was completely stable without complications or problems.  We confirmed that the buttons did not move.  Wounds were copiously irrigated.  Arthroscopic equipments were removed.  The lateral incision was closed with nylon.  Portals were closed with nylon. Anterior incision was closed with subcu Vicryl.  Skin was closed with nylon.  Another 10 mL of Sensorcaine was placed in the skin and 10 mL Sensorcaine and 4 mg of morphine sulfate was injected in the knee joint at the end of the case.  Sterile compressive dressing was applied along with TED hose, ice pack, and T-ROM brace in full extension.  She had no complications or problems.  She will be discharged to home and follow up in the office on Thursday or Friday.  To help the patient's positioning, prepping, draping, technical and surgical assistance throughout the entire case, wound closure, application of dressing and splint and also intraoperative graft preparation, Mr. Shannon Portela, PA-C's assistance was needed throughout the entire case.          ______________________________ Cynda Familia, M.D.     RAC/MEDQ  D:  08/10/2014  T:  08/11/2014  Job:  [id number]

## 2014-08-13 ENCOUNTER — Encounter (HOSPITAL_BASED_OUTPATIENT_CLINIC_OR_DEPARTMENT_OTHER): Payer: Self-pay | Admitting: Specialist

## 2014-09-17 ENCOUNTER — Ambulatory Visit (INDEPENDENT_AMBULATORY_CARE_PROVIDER_SITE_OTHER): Payer: 59 | Admitting: Internal Medicine

## 2014-09-17 VITALS — BP 127/70 | HR 89 | Temp 98.2°F | Resp 16 | Ht 64.0 in | Wt 127.4 lb

## 2014-09-17 DIAGNOSIS — F411 Generalized anxiety disorder: Secondary | ICD-10-CM | POA: Diagnosis not present

## 2014-09-17 MED ORDER — DULOXETINE HCL 60 MG PO CPEP: 60.0000 mg | ORAL_CAPSULE | Freq: Every day | ORAL | Status: DC

## 2014-09-17 NOTE — Progress Notes (Signed)
   Subjective:  This chart was scribed for Tami Lin, MD by Leandra Kern, Medical Scribe. This patient was seen in Room 13 and the patient's care was started at 3:49 PM.   Patient ID: [name], female    DOB: 1986/01/27, 29 y.o.   MRN: [id number]  HPI HPI Comments: [name] is a 29 y.o. female with a history of anxiety who presents to [facility] for a medication refill for cymbalta.  Pt reports that she ran out of her medication yesterday, therefore she has missed two dosages so far. She states that she starting having symptoms of diaphoresis and night sweats during the night time when she misses her medication. Pt notes that she has tried other medications such as paxil which she believes started her sleeping problems, and Effexor which she found no relief with. Pt is open to making changes to her medications. Pt notes that her anxiety has been consistent for the past few years. She also reports have depression after college as a result of not finding a job, however the symptoms have resolved. Pt reports the possibility of having a family history of anxiety.  Pt indicates that she has had 2 ACL totally replacements in the past, one from a a soccer injury, and the second while being intoxicated. The second surgery was done by Hillery Aldo.    Pt notes that she has recently moved from [place].     Health otherwise stable  Review of Systems  Constitutional: Positive for diaphoresis.  Psychiatric/Behavioral: The patient is nervous/anxious.    remainder of review systems noncontributory    Objective:   Physical Exam  Constitutional: She is oriented to person, place, and time. She appears well-developed and well-nourished. No distress.  HENT:  Head: Normocephalic and atraumatic.  Eyes: EOM are normal. Pupils are equal, round, and reactive to light.  Neck: Neck supple.  Cardiovascular: Normal rate.   Pulmonary/Chest: Effort normal.  Neurological: She  is alert and oriented to person, place, and time. No cranial nerve deficit.  Skin: Skin is warm and dry.  Psychiatric: She has a normal mood and affect. Her behavior is normal.  Nursing note and vitals reviewed.  BP 127/70 mmHg  Pulse 89  Temp(Src) 98.2 F (36.8 C) (Oral)  Resp 16  Ht 5' 4"  (1.626 m)  Wt 127 lb 6.4 oz (57.788 kg)  BMI 21.86 kg/m2  SpO2 98%     Assessment & Plan:  I have completed the patient encounter in its entirety as documented by the scribe, with editing by me where necessary. Robert P. Laney Pastor, M.D.  GAD (generalized anxiety disorder)  Meds ordered this encounter  Medications  . DULoxetine (CYMBALTA) 60 MG capsule    Sig: Take 1 capsule (60 mg total) by mouth daily.    Dispense:  30 capsule    Refill:  5   CBT Overcom anx for dummies F/u new PCP

## 2014-10-19 ENCOUNTER — Telehealth: Payer: Self-pay

## 2014-10-19 NOTE — Telephone Encounter (Signed)
Patient called to request a referral to continue seeing Dr. Theda Sers at [facility] for post-operative care following ACL replacement surgery.  She has [facility], and she listed Dr. Linna Darner as her PCP.  She has been seen here once with Dr. Laney Pastor, but it was for an unrelated issue.  Please advise.  Thank you.

## 2014-10-21 NOTE — Telephone Encounter (Signed)
Referrals: can you help?

## 2014-10-21 NOTE — Telephone Encounter (Signed)
I originally sent this to the clinical pool (as well as referrals) because she has not been seen for this, and I wouldn't have been able to submit the referral because she would not have a diagnosis code.

## 2014-10-23 NOTE — Telephone Encounter (Signed)
Dr Hopper 

## 2014-11-24 ENCOUNTER — Ambulatory Visit (INDEPENDENT_AMBULATORY_CARE_PROVIDER_SITE_OTHER): Payer: 59 | Admitting: Family Medicine

## 2014-11-24 VITALS — BP 112/72 | HR 74 | Temp 98.7°F | Resp 18 | Ht 64.0 in | Wt 133.0 lb

## 2014-11-24 DIAGNOSIS — F411 Generalized anxiety disorder: Secondary | ICD-10-CM | POA: Diagnosis not present

## 2014-11-24 DIAGNOSIS — L298 Other pruritus: Secondary | ICD-10-CM

## 2014-11-24 DIAGNOSIS — R519 Headache, unspecified: Secondary | ICD-10-CM

## 2014-11-24 DIAGNOSIS — J0110 Acute frontal sinusitis, unspecified: Secondary | ICD-10-CM

## 2014-11-24 DIAGNOSIS — R51 Headache: Secondary | ICD-10-CM

## 2014-11-24 DIAGNOSIS — G4700 Insomnia, unspecified: Secondary | ICD-10-CM | POA: Diagnosis not present

## 2014-11-24 DIAGNOSIS — N898 Other specified noninflammatory disorders of vagina: Secondary | ICD-10-CM

## 2014-11-24 LAB — POCT WET + KOH PREP
Trich by wet prep: ABSENT
Yeast by KOH: ABSENT
Yeast by wet prep: ABSENT

## 2014-11-24 MED ORDER — BUTALBITAL-APAP-CAFFEINE 50-325-40 MG PO TABS: 1.0000 | ORAL_TABLET | Freq: Four times a day (QID) | ORAL | Status: DC | PRN

## 2014-11-24 MED ORDER — AZITHROMYCIN 250 MG PO TABS: ORAL_TABLET | ORAL | Status: DC

## 2014-11-24 MED ORDER — DOXEPIN HCL 25 MG PO CAPS: ORAL_CAPSULE | ORAL | Status: DC

## 2014-11-24 NOTE — Progress Notes (Signed)
Patient ID: [name], female    DOB: 04/09/1985  Age: 29 y.o. MRN: [id number]  Chief Complaint  Patient presents with  . Nasal Congestion    allergies   . Referral    allergy testing   . Vaginal Itching    on going issue gyn finds no issue   . Depression    see screen    Subjective:  Patient is here with several complaints.  She has a history of persisting itching around the opening to the vagina.  No pain .  No discharge.  Gyn found nothing.  Has history of depression, takes meds.  Nasal allergies irritate, some sinus congestion, frontal headache and drainage.   Current allergies, medications, problem list, past/family and social histories reviewed.  Objective:  BP 112/72 mmHg  Pulse 74  Temp(Src) 98.7 F (37.1 C) (Oral)  Resp 18  Ht 5' 4"  (1.626 m)  Wt 133 lb (60.328 kg)  BMI 22.82 kg/m2  SpO2 98%  TM's normal.  Nose sl congestion.  Tenderness sinuses.  Headache frontal.  Chest CTA.  Abd. Soft. No external genital rash.  Minimal erythema.  Vagina looks normal.  Wet prep.  Normal bimanuel.   Assessment & Plan:   Assessment: 1. Vaginal itching   2. Subacute frontal sinusitis   3. Nonintractable episodic headache, unspecified headache type   4. Generalized anxiety disorder   5. Insomnia       Plan: Nonspecific itching.  Will try to rx with doxepin.  Otherwise may have to send back to GYN.  Orders Placed This Encounter  Procedures  . POCT Wet + KOH Prep ([facility])    Meds ordered this encounter  Medications  . doxepin (SINEQUAN) 25 MG capsule    Sig: Take 1-2 pills at bedtime for sleep and itching    Dispense:  30 capsule    Refill:  2  . azithromycin (ZITHROMAX) 250 MG tablet    Sig: Take 2 tabs PO x 1 dose, then 1 tab PO QD x 4 days    Dispense:  6 tablet    Refill:  0  . butalbital-acetaminophen-caffeine (FIORICET) 50-325-40 MG tablet    Sig: Take 1-2 tablets by mouth every 6 (six) hours as needed for headache.    Dispense:  20 tablet    Refill:  0         Patient Instructions  Take the doxepin 25 mg 1-2 pills at bedtime for sleep and itching  Take the azithromycin 2 pills initially, then 1 daily for 4 days for sinus infection  Take the generic Fioricet 1 or 2 pills every 6 hours as needed for headache. Cautioned that these contain a little bit of caffeine and I would not take them at bedtime.  Return if not improving  Continue other medications that you are on.  If itching is intolerable use a tiny amount of over-the-counter 1% hydrocortisone cream, but she should not use it for more than a couple of days consecutive     Return if symptoms worsen or fail to improve.   Shannon Wickes, MD 11/28/2014

## 2014-11-24 NOTE — Patient Instructions (Addendum)
Take the doxepin 25 mg 1-2 pills at bedtime for sleep and itching  Take the azithromycin 2 pills initially, then 1 daily for 4 days for sinus infection  Take the generic Fioricet 1 or 2 pills every 6 hours as needed for headache. Cautioned that these contain a little bit of caffeine and I would not take them at bedtime.  Return if not improving  Continue other medications that you are on.  If itching is intolerable use a tiny amount of over-the-counter 1% hydrocortisone cream, but she should not use it for more than a couple of days consecutive

## 2015-01-31 ENCOUNTER — Telehealth: Payer: Self-pay | Admitting: Family Medicine

## 2015-01-31 NOTE — Telephone Encounter (Signed)
Left a message for patient to return call to set up appointment for depression and discuss risk factors of duloxetine per.

## 2015-02-19 ENCOUNTER — Ambulatory Visit (INDEPENDENT_AMBULATORY_CARE_PROVIDER_SITE_OTHER): Payer: 59 | Admitting: Internal Medicine

## 2015-02-19 VITALS — BP 124/86 | HR 80 | Temp 98.0°F | Resp 16 | Ht 64.0 in | Wt 138.0 lb

## 2015-02-19 DIAGNOSIS — F411 Generalized anxiety disorder: Secondary | ICD-10-CM

## 2015-02-19 DIAGNOSIS — G4700 Insomnia, unspecified: Secondary | ICD-10-CM

## 2015-02-19 DIAGNOSIS — L299 Pruritus, unspecified: Secondary | ICD-10-CM | POA: Diagnosis not present

## 2015-02-19 MED ORDER — DOXEPIN HCL 50 MG PO CAPS: ORAL_CAPSULE | ORAL | Status: DC

## 2015-02-19 MED ORDER — SUMATRIPTAN SUCCINATE 100 MG PO TABS: 100.0000 mg | ORAL_TABLET | ORAL | Status: DC | PRN

## 2015-02-19 MED ORDER — CLONAZEPAM 0.5 MG PO TABS: 0.5000 mg | ORAL_TABLET | Freq: Two times a day (BID) | ORAL | Status: DC | PRN

## 2015-02-19 MED ORDER — TRIAMCINOLONE ACETONIDE 0.1 % EX CREA: 1.0000 "application " | TOPICAL_CREAM | Freq: Two times a day (BID) | CUTANEOUS | Status: DC

## 2015-02-19 MED ORDER — DULOXETINE HCL 60 MG PO CPEP: 60.0000 mg | ORAL_CAPSULE | Freq: Every day | ORAL | Status: DC

## 2015-02-19 NOTE — Progress Notes (Addendum)
Subjective:  By signing my name below, I, Rawaa Al Rifaie, attest that this documentation has been prepared under the direction and in the presence of Tami Lin, MD.  Royann Shivers Rifaie, Medical Scribe. 02/19/2015.  12:21 PM.  I have completed the patient encounter in its entirety as documented by the scribe, with editing by me where necessary. Robert P. Laney Pastor, M.D.    Patient ID: [name], female    DOB: Jun 08, 1985, 29 y.o.   MRN: [id number]  Chief Complaint  Patient presents with  . Medication Refill    cymbalta, imitrex, klonopin    HPI HPI Comments: [name] is a 29 y.o. female who presents to [facility] requesting a medication refill for Cymbalta, Imitrex, and klonopin.  Pt had an ACL replacement surgery, she indicates that she had an MRI yesterday that showed possible cartilage damage. She reports that a cortisone shot did not give her much relief.  Vaginal Itching: She indicates that she takes Doxypin for vaginal itching, she states that it has improved the symptoms, however never resolved them. She reports that the itching is only on one spot, and she has a similar invisible spot on her left upper arm that has been present for about 1.5 years. She denies pain to the area, or any urinary issues.   Rash: She states that she has hives on her hips and Buttocks area, gradual onset about 6 months ago, that have worsened within the past few weeks. She states that the hives are becoming increasingly itchy, but reports that today the area has improved in redness mildly. She applies lotion on the area daily. She also reports the presence of bumps on the area that could possibly be ingrown hair according to her.   Migraines: Pt states that she is able to predict when her migraines start due to experiencing pain behind her eyes prior to the onset of the episode, and that is when she takes Imitrex.   Anxiety: Pt takes Klonopin at night time for her  anxiety symptoms. She states that it helps her to sleep due to her inability to shut thoughts off from her brain. She reports having many nightmares and reports that she has never really experienced peaceful sleep since she has been on her medications. Pt states that she is compliant with taking the Cymbalta daily. She reports a family history of anxiety (mother and father).  Pt has recently started a new job, and is motivated to be successful in it. Pt denies obsessive compulsive behavior, however she does reports being very organized. Pt was never on Prozac or Zoloft.   Weight Gain: Pt reports that she has gained about 20 lbs since last year.  She states that she eats very healthy, and does not excessively drink alcohol.    Patient Active Problem List   Diagnosis Date Noted  . GAD (generalized anxiety disorder) 09/17/2014  . ACL graft tear ([place]) 08/10/2014  . S/P ACL reconstruction 08/10/2014  . Migraine headache 05/15/2010   Past Medical History  Diagnosis Date  . Migraine headache   . ACL graft tear ([facility])     left knee  . Complication of anesthesia     woke up during surgery  . Family history of adverse reaction to anesthesia      mom is difficult to put to sleep  . IUD (intrauterine device) in place   . Knee pain, right   . Sleep paralysis   . Anxiety  Past Surgical History  Procedure Laterality Date  . Knee arthroscopy w/ acl reconstruction    . Knee arthroscopy Left 08/10/2014    Procedure: ARTHROSCOPY KNEE DEBRIDEMENT ALLOGRAFT ACL REVISION RECONSTRUCTION;  Surgeon: Sydnee Cabal, MD;  Location: [facility];  Service: Orthopedics;  Laterality: Left;  . Anterior cruciate ligament repair Left 08/10/2014    Procedure: ANTERIOR CRUCIATE LIGAMENT (ACL) REPAIR;  Surgeon: Sydnee Cabal, MD;  Location: [facility];  Service: Orthopedics;  Laterality: Left;   No Known Allergies Prior to Admission medications   Medication Sig Start Date End Date  Taking? Authorizing Provider  clonazePAM (KLONOPIN) 0.5 MG tablet Take 0.5 mg by mouth 2 (two) times daily as needed for anxiety.   Yes Historical Provider, MD  DULoxetine (CYMBALTA) 60 MG capsule Take 1 capsule (60 mg total) by mouth daily. 09/17/14  Yes Leandrew Koyanagi, MD  SUMAtriptan (IMITREX) 100 MG tablet Take 1 tablet (100 mg total) by mouth every 2 (two) hours as needed. 03/12/11  Yes Ricard Dillon, MD  doxepin (SINEQUAN) 25 MG capsule Take 1-2 pills at bedtime for sleep and itching Patient not taking: Reported on 02/19/2015 11/24/14   Posey Boyer, MD  methocarbamol (ROBAXIN) 500 MG tablet Take 1 tablet (500 mg total) by mouth every 6 (six) hours as needed. Patient not taking: Reported on 09/17/2014 08/10/14   Sueanne Margarita Stilwell, PA-C  oxyCODONE-acetaminophen (ROXICET) 5-325 MG per tablet Take 1-2 tablets by mouth every 4 (four) hours as needed. Patient not taking: Reported on 09/17/2014 08/10/14   Lajean Manes, PA-C   Social History   Social History  . Marital Status: Single    Spouse Name: N/A  . Number of Children: N/A  . Years of Education: N/A   Occupational History  . Not on file.   Social History Main Topics  . Smoking status: Never Smoker   . Smokeless tobacco: Never Used  . Alcohol Use: 2.4 oz/week    4 Glasses of wine per week  . Drug Use: No  . Sexual Activity: Yes   Other Topics Concern  . Not on file   Social History Narrative    Review of Systems  Genitourinary: Negative for dysuria, frequency, vaginal discharge and vaginal pain.  Musculoskeletal: Positive for arthralgias.  Skin: Positive for rash.  Neurological: Positive for headaches.  Psychiatric/Behavioral: Positive for dysphoric mood. The patient is nervous/anxious.       Objective:   Physical Exam  Constitutional: She is oriented to person, place, and time. She appears well-developed and well-nourished. No distress.  HENT:  Head: Normocephalic and atraumatic.  Eyes: EOM are normal.  Pupils are equal, round, and reactive to light.  Neck: Neck supple.  Cardiovascular: Normal rate.   Pulmonary/Chest: Effort normal.  Neurological: She is alert and oriented to person, place, and time. No cranial nerve deficit.  Skin: Skin is warm and dry. Rash noted.  Few scattered area of heel folliculitis on the lateral thighs.    Psychiatric: She has a normal mood and affect. Her behavior is normal.  Nursing note and vitals reviewed.   BP 124/86 mmHg  Pulse 80  Temp(Src) 98 F (36.7 C)  Resp 16  Ht 5' 4"  (1.626 m)  Wt 138 lb (62.596 kg)  BMI 23.68 kg/m2  SpO2 98%     Assessment & Plan:  Generalized anxiety disorder --reinforced CBT/she is using OA for D//can't afford couns now --no clear obsess to indicate OCD complicating treatment Insomnia -worries and can't  fall asleep  Plan: doxepin (SINEQUAN) 50 MG capsulestarted by Dr Linna Darner for itch will be increased to help sleep and perhaps anxiety prozac might be a next alternative Uses clonaz already hs  Pruritic condition--without skin findings --also areas with folliculitis-friction type without dry skin changes -also recurrent hivs on lateral thighs ? etio -can't assoc this with new meds  Meds ordered this encounter  Medications  . doxepin (SINEQUAN) 50 MG capsule    Sig: Take 1-2 pills at bedtime for sleep and itching    Dispense:  60 capsule    Refill:  2  . triamcinolone cream (KENALOG) 0.1 %    Sig: Apply 1 application topically 2 (two) times daily.    Dispense:  30 g    Refill:  1  . clonazePAM (KLONOPIN) 0.5 MG tablet    Sig: Take 1 tablet (0.5 mg total) by mouth 2 (two) times daily as needed for anxiety.    Dispense:  30 tablet    Refill:  5  . SUMAtriptan (IMITREX) 100 MG tablet    Sig: Take 1 tablet (100 mg total) by mouth every 2 (two) hours as needed.    Dispense:  9 tablet    Refill:  4  . DULoxetine (CYMBALTA) 60 MG capsule    Sig: Take 1 capsule (60 mg total) by mouth daily.    Dispense:  30  capsule    Refill:  5   Fu 3-4 mos

## 2015-06-10 ENCOUNTER — Ambulatory Visit (INDEPENDENT_AMBULATORY_CARE_PROVIDER_SITE_OTHER): Payer: BLUE CROSS/BLUE SHIELD | Admitting: Internal Medicine

## 2015-06-10 VITALS — BP 132/82 | HR 84 | Temp 98.4°F | Resp 18 | Ht 64.0 in | Wt 137.0 lb

## 2015-06-10 DIAGNOSIS — L689 Hypertrichosis, unspecified: Secondary | ICD-10-CM | POA: Diagnosis not present

## 2015-06-10 DIAGNOSIS — Z3040 Encounter for surveillance of contraceptives, unspecified: Secondary | ICD-10-CM | POA: Diagnosis not present

## 2015-06-10 DIAGNOSIS — R5383 Other fatigue: Secondary | ICD-10-CM | POA: Diagnosis not present

## 2015-06-10 DIAGNOSIS — Z124 Encounter for screening for malignant neoplasm of cervix: Secondary | ICD-10-CM | POA: Diagnosis not present

## 2015-06-10 DIAGNOSIS — B029 Zoster without complications: Secondary | ICD-10-CM

## 2015-06-10 DIAGNOSIS — G4700 Insomnia, unspecified: Secondary | ICD-10-CM | POA: Diagnosis not present

## 2015-06-10 DIAGNOSIS — Z0000 Encounter for general adult medical examination without abnormal findings: Secondary | ICD-10-CM | POA: Diagnosis not present

## 2015-06-10 DIAGNOSIS — F411 Generalized anxiety disorder: Secondary | ICD-10-CM | POA: Diagnosis not present

## 2015-06-10 DIAGNOSIS — Z209 Contact with and (suspected) exposure to unspecified communicable disease: Secondary | ICD-10-CM | POA: Diagnosis not present

## 2015-06-10 LAB — COMPREHENSIVE METABOLIC PANEL
ALT: 13 U/L (ref 6–29)
AST: 16 U/L (ref 10–30)
Albumin: 4.2 g/dL (ref 3.6–5.1)
Alkaline Phosphatase: 76 U/L (ref 33–115)
BUN: 16 mg/dL (ref 7–25)
CO2: 26 mmol/L (ref 20–31)
Calcium: 9.1 mg/dL (ref 8.6–10.2)
Chloride: 104 mmol/L (ref 98–110)
Creat: 0.61 mg/dL (ref 0.50–1.10)
Glucose, Bld: 72 mg/dL (ref 65–99)
Potassium: 4.6 mmol/L (ref 3.5–5.3)
Sodium: 138 mmol/L (ref 135–146)
Total Bilirubin: 0.4 mg/dL (ref 0.2–1.2)
Total Protein: 6.6 g/dL (ref 6.1–8.1)

## 2015-06-10 LAB — POCT CBC
Granulocyte percent: 70.6 %G (ref 37–80)
HCT, POC: 40.6 % (ref 37.7–47.9)
Hemoglobin: 14.4 g/dL (ref 12.2–16.2)
Lymph, poc: 1.2 (ref 0.6–3.4)
MCH, POC: 32.5 pg — AB (ref 27–31.2)
MCHC: 35.4 g/dL (ref 31.8–35.4)
MCV: 92 fL (ref 80–97)
MID (cbc): 0.5 (ref 0–0.9)
MPV: 7.2 fL (ref 0–99.8)
POC Granulocyte: 4 (ref 2–6.9)
POC LYMPH PERCENT: 20.6 %L (ref 10–50)
POC MID %: 8.8 %M (ref 0–12)
Platelet Count, POC: 296 10*3/uL (ref 142–424)
RBC: 4.41 M/uL (ref 4.04–5.48)
RDW, POC: 12.5 %
WBC: 5.7 10*3/uL (ref 4.6–10.2)

## 2015-06-10 LAB — TSH: TSH: 0.88 mIU/L

## 2015-06-10 LAB — POCT URINALYSIS DIP (MANUAL ENTRY)
Bilirubin, UA: NEGATIVE
Blood, UA: NEGATIVE
Glucose, UA: NEGATIVE
Ketones, POC UA: NEGATIVE
Leukocytes, UA: NEGATIVE
Nitrite, UA: NEGATIVE
Protein Ur, POC: 30 — AB
Spec Grav, UA: 1.015
Urobilinogen, UA: 0.2
pH, UA: 7

## 2015-06-10 LAB — HIV ANTIBODY (ROUTINE TESTING W REFLEX): HIV 1&2 Ab, 4th Generation: NONREACTIVE

## 2015-06-10 LAB — LIPID PANEL
Cholesterol: 205 mg/dL — ABNORMAL HIGH (ref 125–200)
HDL: 71 mg/dL (ref >=46)
LDL Cholesterol: 119 mg/dL (ref <130)
Total CHOL/HDL Ratio: 2.9 Ratio (ref <=5.0)
Triglycerides: 75 mg/dL (ref <150)
VLDL: 15 mg/dL (ref <30)

## 2015-06-10 MED ORDER — DOXEPIN HCL 50 MG PO CAPS
ORAL_CAPSULE | ORAL | Status: DC
Start: 2015-06-10 — End: 2016-07-17

## 2015-06-10 MED ORDER — VALACYCLOVIR HCL 1 G PO TABS: 1000.0000 mg | ORAL_TABLET | Freq: Three times a day (TID) | ORAL | Status: DC

## 2015-06-10 MED ORDER — SUMATRIPTAN SUCCINATE 100 MG PO TABS: 100.0000 mg | ORAL_TABLET | ORAL | Status: DC | PRN

## 2015-06-10 MED ORDER — CLONAZEPAM 0.5 MG PO TABS: 0.5000 mg | ORAL_TABLET | Freq: Two times a day (BID) | ORAL | Status: DC | PRN

## 2015-06-10 MED ORDER — DULOXETINE HCL 60 MG PO CPEP: 60.0000 mg | ORAL_CAPSULE | Freq: Every day | ORAL | Status: DC

## 2015-06-10 NOTE — Patient Instructions (Addendum)
Philis Fendt PA-C Sarah Weber PA-C Chelle Jeffery PA-C [name] English PA-C    IF you received an x-ray today, you will receive an invoice from [facility]. Please contact [facility] at [phone] with questions or concerns regarding your invoice.   IF you received labwork today, you will receive an invoice from [employer]. Please contact [facility] at [phone] with questions or concerns regarding your invoice.   Our billing staff will not be able to assist you with questions regarding bills from these companies.  You will be contacted with the lab results as soon as they are available. The fastest way to get your results is to activate your My Chart account. Instructions are located on the last page of this paperwork. If you have not heard from [place] regarding the results in 2 weeks, please contact this office.

## 2015-06-10 NOTE — Progress Notes (Signed)
Subjective:    Patient ID: [name], female    DOB: 05-30-1985, 30 y.o.   MRN: [id number] By signing my name below, I, [name], attest that this documentation has been prepared under the direction and in the presence of Tami Lin, MD. Electronically Signed: Judithe Gonzalez, ER Scribe. 06/10/2015. 3:19 PM.  Chief Complaint  Patient presents with  . Annual Exam    with pap  . Insect Bite    on her left shouder, been there for a week  . STD screening    recently got out of a relationship requesting STD test      HPI HPI Comments: [name] is a 30 y.o. female with a hx of anxiety and migraines who presents to [facility] reporting for a full physical. She would like STI testing. She recently left a long term relationship.  She had surgery to repair her ACL 2nd time,repaired six months ago.    History of generalized anxiety disorder since the age of 7. She describes her childhood as having a "mother from [place]". Even tenderness today she will drink alcohol for visiting for Easter dinner, to be prepared. She states that her relationship with her mother is difficult. Her mother is never pleased by anything she does. She used to have stomach pain every time she was around her mother. Mother and father still married. Gets along well with father. She feels good about her medication. She has been taking less klonopin(now less than daily), has been taking her Cymbalta as prescribed.   She gets some spring allergies.  She is concerned about weight gain. Since her last surgery, She has gained 15-20 pounds since the surgery and cannot understand-She is a healthy eater. There has been less exercise. She has a family hx of thyroid disorder. She feels chronically hot. She has bouts of tachycardia. She is fatigued on a daily basis. She has gained 20 lbs in the last 8-10 months. She is working out less than she has in the past. She also notes that her skin tone has become more opaque.  Her hair has been slightly thinner over the last year.  She is currently being treated at a skin center for excessive hair growth on the face and thighs. They have asked for her to have a complete evaluation for metabolic causes of hirsutism. She never had polycystic ovary disease. Currently no menses because using IUDSkyla  She is also complaining of a spider bite to her left shoulder that initially popped up 5 days ago, and now she has several spots.  In the same area. She has a degree of pain up and down the area adjacent to this.   Patient Active Problem List   Diagnosis Date Noted  . GAD (generalized anxiety disorder) 09/17/2014  . ACL graft tear ([place]) 08/10/2014  . S/P ACL reconstruction 08/10/2014  . Migraine headache 05/15/2010    Past Medical History  Diagnosis Date  . Migraine headache   . ACL graft tear ([facility])     left knee  . Complication of anesthesia     woke up during surgery  . Family history of adverse reaction to anesthesia      mom is difficult to put to sleep  . IUD (intrauterine device) in place   . Knee pain, right   . Sleep paralysis   . Anxiety   . Allergy    No Known Allergies  Current Outpatient Prescriptions on File Prior to Visit  Medication Sig  Dispense Refill  . clonazePAM (KLONOPIN) 0.5 MG tablet Take 1 tablet (0.5 mg total) by mouth 2 (two) times daily as needed for anxiety. 30 tablet 5  . doxepin (SINEQUAN) 50 MG capsule Take 1-2 pills at bedtime for sleep and itching 60 capsule 2  . DULoxetine (CYMBALTA) 60 MG capsule Take 1 capsule (60 mg total) by mouth daily. 30 capsule 5  . SUMAtriptan (IMITREX) 100 MG tablet Take 1 tablet (100 mg total) by mouth every 2 (two) hours as needed. 9 tablet 4   No current facility-administered medications on file prior to visit.    Review of Systems  Constitutional: Positive for activity change, fatigue and unexpected weight change. Negative for fever, chills, diaphoresis and appetite change.  HENT:  Negative for congestion, postnasal drip, rhinorrhea and trouble swallowing.   Eyes: Negative for photophobia and visual disturbance.  Respiratory: Negative for cough, chest tightness and shortness of breath.   Cardiovascular: Negative for chest pain, palpitations and leg swelling.  Gastrointestinal: Negative for nausea, vomiting, diarrhea and constipation.       Feels fat/clothes too tight  Genitourinary: Negative for vaginal discharge, difficulty urinating, genital sores, menstrual problem, pelvic pain and dyspareunia.       Occasional bed wetting--on and off for several years. Occurs approximately once for 5 months with class clear indication why. She never has this during the daytime. No current urinary symptoms  Musculoskeletal: Negative for back pain and gait problem.  Neurological: Positive for headaches.       History of migraines since early childhood. She now manages this with intermittent use of triptan's  Psychiatric/Behavioral: Positive for sleep disturbance. Negative for behavioral problems, dysphoric mood and decreased concentration. The patient is nervous/anxious.        Her insomnia is intermittent and related to anxiety at bedtime but is not enough to affect her sleep cycle overall or cause daytime fatigue. Her fatigue is felt as the day moves on. In general her sleep responds to doxepin whenever needed      Objective:  BP 132/82 mmHg  Pulse 84  Temp(Src) 98.4 F (36.9 C) (Oral)  Resp 18  Ht 5' 4"  (1.626 m)  Wt 137 lb (62.143 kg)  BMI 23.50 kg/m2  SpO2 96%  Physical Exam  Constitutional: She is oriented to person, place, and time. She appears well-developed and well-nourished. No distress.  HENT:  Head: Normocephalic and atraumatic.  Right Ear: External ear normal.  Left Ear: External ear normal.  Nose: Nose normal.  Mouth/Throat: Oropharynx is clear and moist.  Eyes: Conjunctivae and EOM are normal. Pupils are equal, round, and reactive to light.  Neck: Normal  range of motion. Neck supple. No thyromegaly present.  Cardiovascular: Normal rate, regular rhythm, normal heart sounds and intact distal pulses.   No murmur heard. Pulmonary/Chest: Effort normal and breath sounds normal. No respiratory distress. She has no wheezes.  Abdominal: Soft. Bowel sounds are normal. She exhibits no distension and no mass. There is no tenderness. There is no rebound.  Genitourinary:  Introitus clear Os clear with IUD string visible Uterus mid position and nontender No adnexal masses  Musculoskeletal: Normal range of motion. She exhibits no edema or tenderness.  Lymphadenopathy:    She has no cervical adenopathy.  Neurological: She is alert and oriented to person, place, and time. She has normal reflexes. No cranial nerve deficit. Coordination normal.  Skin: Skin is warm and dry. No rash noted. She is not diaphoretic.  Cluster of red papules that on  left shoulder. Arthroscopy scars on right knee. Her skin around these lesions is hypersensitive to touch  Psychiatric: She has a normal mood and affect. Her behavior is normal. Judgment and thought content normal.  Nursing note and vitals reviewed. BP 132/82 mmHg  Pulse 84  Temp(Src) 98.4 F (36.9 C) (Oral)  Resp 18  Ht 5' 4"  (1.626 m)  Wt 137 lb (62.143 kg)  BMI 23.50 kg/m2  SpO2 96%      Assessment & Plan:  Annual physical exam - Plan: Lipid panel  Other fatigue - Plan: Comprehensive metabolic panel, TSH, POCT CBC,   GAD (generalized anxiety disorder) - Plan: No change in medications//she is making good progress in her control of this disorder which is likely both genetic and environmental  Encounter for surveillance of contraceptives - Plan: Pap IG, CT/NG w/ reflex HPV when ASC-U,  -Not currently sexually active and will consider removing or placing IUD as it is near its termination date  Exposure to communicable disease - Plan: RPR, HIV antibody, POCT urinalysis dipstick, ct/ng  Excessive hair growth -  Plan: Testosterone, Estrogens, Total, DHEA,   Insomnia - Plan: doxepin (SINEQUAN) 50 MG capsule,  Shingles--go ahead with Valtrex  Migraine headaches-refill Maxalt  Meds ordered this encounter  Medications  . valACYclovir (VALTREX) 1000 MG tablet    Sig: Take 1 tablet (1,000 mg total) by mouth 3 (three) times daily.    Dispense:  21 tablet    Refill:  0  . DULoxetine (CYMBALTA) 60 MG capsule    Sig: Take 1 capsule (60 mg total) by mouth daily.    Dispense:  90 capsule    Refill:  3  . doxepin (SINEQUAN) 50 MG capsule    Sig: Take 1-2 pills at bedtime for sleep and itching    Dispense:  180 capsule    Refill:  3  . clonazePAM (KLONOPIN) 0.5 MG tablet    Sig: Take 1 tablet (0.5 mg total) by mouth 2 (two) times daily as needed for anxiety.    Dispense:  30 tablet    Refill:  5  . SUMAtriptan (IMITREX) 100 MG tablet    Sig: Take 1 tablet (100 mg total) by mouth every 2 (two) hours as needed.    Dispense:  9 tablet    Refill:  4    We discussed follow-up here with PA-C after i retire///6 mo or sooner if needed   I have completed the patient encounter in its entirety as documented by the scribe, with editing by me where necessary. Skarlett Sedlacek P. Laney Pastor, M.D.

## 2015-06-10 NOTE — Progress Notes (Deleted)
yyy

## 2015-06-11 LAB — RPR

## 2015-06-11 LAB — TESTOSTERONE: Testosterone: 38 ng/dL

## 2015-06-14 ENCOUNTER — Encounter: Payer: Self-pay | Admitting: Internal Medicine

## 2015-06-14 LAB — PAP IG, CT-NG, RFX HPV ASCU
Chlamydia Probe Amp: NOT DETECTED
GC Probe Amp: NOT DETECTED

## 2015-06-14 LAB — DHEA: DHEA: 525 ng/dL (ref 102–1185)

## 2015-06-15 LAB — ESTROGENS, TOTAL: Estrogen: 117.6 pg/mL

## 2015-06-24 ENCOUNTER — Telehealth: Payer: Self-pay

## 2015-06-24 NOTE — Telephone Encounter (Signed)
Msg is for [name], pt states Valtrex doesn't seem to work. She is having new spots appear and wants to know if she can have a refill plus a topical cream to help with this.  Please advise  [phone]

## 2015-06-25 ENCOUNTER — Telehealth: Payer: Self-pay

## 2015-06-25 NOTE — Telephone Encounter (Signed)
IC pt with lab results per Dr. Laney Pastor - no VM available.  Dr. Keturah Barre sent results in mail.  Pt has new telephone message from 4/28 in pool.

## 2015-06-25 NOTE — Telephone Encounter (Signed)
-----   Message from Leandrew Koyanagi, MD sent at 06/19/2015  8:26 PM EDT ----- Reassure all labs normal--copy in the mail

## 2015-06-29 NOTE — Telephone Encounter (Signed)
Called and advised pt of Dr Doolittle's instructions. Pt agreed to RTC and will try to get here on Sat to see Dr Laney Pastor.

## 2015-06-29 NOTE — Telephone Encounter (Signed)
Dr Laney Pastor, I called pt who reported that she is still having new "bumps" of the shingles appear and now it is on her face (cheek) also. She felt like the Valtrex may have helped some why she was taking it, although she did get new outbreaks while still taking it. She doesn't know if she need another round of Valtrex called in, and /or a topical to help with the itching? Please advise. Pt stated that she would rather not have to come back in. Pt advised the rash is not too close to her eye, more on her cheek close to her nose. This pt could not be reached earlier, and her VM was full. She stated she will clear it out and we can LM if needed.

## 2015-06-29 NOTE — Telephone Encounter (Signed)
This rash should not spread from its original location and so these bumps would likely be something else---needs eval before putting anything on it

## 2016-05-11 ENCOUNTER — Telehealth: Payer: Self-pay | Admitting: General Practice

## 2016-05-11 NOTE — Telephone Encounter (Signed)
Pt is needing to talk to someone about her migraine meds she only gets 9 pills and she understands that doolittle has retired and a new provider needs to see patient buy she is planning on coming in April when insurance goes into effect but she is needing this medication   Best number [phone]

## 2016-06-14 ENCOUNTER — Ambulatory Visit (INDEPENDENT_AMBULATORY_CARE_PROVIDER_SITE_OTHER): Payer: BLUE CROSS/BLUE SHIELD

## 2016-06-15 ENCOUNTER — Ambulatory Visit: Payer: BLUE CROSS/BLUE SHIELD | Admitting: Physician Assistant

## 2016-06-15 VITALS — BP 133/82 | HR 80 | Temp 98.5°F | Resp 18 | Ht 64.0 in | Wt 143.8 lb

## 2016-06-15 DIAGNOSIS — F411 Generalized anxiety disorder: Secondary | ICD-10-CM | POA: Diagnosis not present

## 2016-06-15 DIAGNOSIS — R5383 Other fatigue: Secondary | ICD-10-CM | POA: Diagnosis not present

## 2016-06-15 MED ORDER — SUMATRIPTAN SUCCINATE 100 MG PO TABS: 100.0000 mg | ORAL_TABLET | ORAL | 4 refills | Status: DC | PRN

## 2016-06-15 MED ORDER — CLONAZEPAM 0.5 MG PO TABS: 0.5000 mg | ORAL_TABLET | Freq: Every day | ORAL | 2 refills | Status: DC | PRN

## 2016-06-15 MED ORDER — DULOXETINE HCL 60 MG PO CPEP: 60.0000 mg | ORAL_CAPSULE | Freq: Every day | ORAL | 3 refills | Status: DC

## 2016-06-15 NOTE — Patient Instructions (Addendum)
I will contact you with the lab results.  [facility] Policy for Prescribing Controlled Substances (Revised 12/2011) 1. Prescriptions for controlled substances will be filled by ONE provider at [facility] with whom you have established and developed a plan for your care, including follow-up. 2. You are encouraged to schedule an appointment with your prescriber at our appointment center for follow-up visits whenever possible. 3. If you request a prescription for the controlled substance while at [facility] for an acute problem (with someone other than your regular prescriber), you MAY be given a ONE-TIME prescription for a 30-day supply of the controlled substance, to allow time for you to return to see your regular prescriber for additional prescriptions.     IF you received an x-ray today, you will receive an invoice from [facility]. Please contact [facility] at [phone] with questions or concerns regarding your invoice.   IF you received labwork today, you will receive an invoice from [place]. Please contact [facility] at [phone] with questions or concerns regarding your invoice.   Our billing staff will not be able to assist you with questions regarding bills from these companies.  You will be contacted with the lab results as soon as they are available. The fastest way to get your results is to activate your My Chart account. Instructions are located on the last page of this paperwork. If you have not heard from [place] regarding the results in 2 weeks, please contact this office.

## 2016-06-15 NOTE — Progress Notes (Signed)
PRIMARY CARE AT [facility] [address], [place] [address] [phone]  Date:  06/15/2016   Name:  [name]   DOB:  02/08/1986   MRN:  [id number]  PCP:  No PCP Per Patient    History of Present Illness:  [name] is a 31 y.o. female patient who presents to PCP with  Chief Complaint  Patient presents with  . Medication Refill    Clonazepam, Duloxetine HCl, Sumatriptan, flexeril (for pinched nerve)  . Thyroid Problem     Anxiety: She has been followed by Dr. Laney Pastor in this practice until his retirement for many years.  She has noted anxiety and panic attacks, which she took the klonopin.  She can get super tense.  Hx of panic attacks, last flare was 3 weeks ago.  She remained up until 4am.   She has had a great deal of stressors with her work, and lost the house that she thought she was going to get.  Every other day. She currently is not seeing a therapist.   She has a good support system with boyfriend.  Exercises 3 times per week on average.  HIT and weight training.  She practices "clean eating".  1 glass per night of wine.    Migraines: she has had 10 migraines in the last month, which has progressively worsened.  Feels very stressed.  Auras, severe pain behind eyes, and back of neck.  No nausea or vomiting.  No nausea.  She would like a refill of the flexeril for her migraines.  She has tense muscles of her neck and shoulder pain.  10-15 tablets.  She uses this to hopefully ward off the muscle relaxer.   She would like to recheck thyroid.  For the last month, she has dramatic fatigue, weight gain.  She does not have menstrual cycle due to IUD.  She is concerned with family hx of her father having severe thyroid disease and surgery followed in a relatively acute situation.  She is fearful that this could happen to her.  No nausea, excessive thirst, polyuria, palpitations, diarrhea.  Wt Readings from Last 3 Encounters:  06/15/16 143 lb 12.8 oz (65.2 kg)  06/10/15 137 lb  (62.1 kg)  02/19/15 138 lb (62.6 kg)    Patient Active Problem List   Diagnosis Date Noted  . GAD (generalized anxiety disorder) 09/17/2014  . ACL graft tear ([place]) 08/10/2014  . S/P ACL reconstruction 08/10/2014  . Migraine headache 05/15/2010    Past Medical History:  Diagnosis Date  . ACL graft tear ([facility])    left knee  . Allergy   . Anxiety   . Complication of anesthesia    woke up during surgery  . Family history of adverse reaction to anesthesia     mom is difficult to put to sleep  . IUD (intrauterine device) in place   . Knee pain, right   . Migraine headache   . Sleep paralysis     Past Surgical History:  Procedure Laterality Date  . ANTERIOR CRUCIATE LIGAMENT REPAIR Left 08/10/2014   Procedure: ANTERIOR CRUCIATE LIGAMENT (ACL) REPAIR;  Surgeon: Sydnee Cabal, MD;  Location: [place];  Service: Orthopedics;  Laterality: Left;  . KNEE ARTHROSCOPY Left 08/10/2014   Procedure: ARTHROSCOPY KNEE DEBRIDEMENT ALLOGRAFT ACL REVISION RECONSTRUCTION;  Surgeon: Sydnee Cabal, MD;  [facility];  Service: Orthopedics;  Laterality: Left;  . KNEE ARTHROSCOPY W/ ACL RECONSTRUCTION      Social History  Substance  Use Topics  . Smoking status: Never Smoker  . Smokeless tobacco: Never Used  . Alcohol use 2.4 oz/week    4 Glasses of wine per week    Family History  Problem Relation Age of Onset  . Goiter Father   . Hyperlipidemia Father   . Hypertension Maternal Grandmother   . Stroke Maternal Grandfather   . Cancer Maternal Grandfather   . Stroke Paternal Grandmother     No Known Allergies  Medication list has been reviewed and updated.  Current Outpatient Prescriptions on File Prior to Visit  Medication Sig Dispense Refill  . clonazePAM (KLONOPIN) 0.5 MG tablet Take 1 tablet (0.5 mg total) by mouth 2 (two) times daily as needed for anxiety. 30 tablet 5  . DULoxetine (CYMBALTA) 60 MG capsule Take 1 capsule (60 mg total) by  mouth daily. 90 capsule 3  . SUMAtriptan (IMITREX) 100 MG tablet Take 1 tablet (100 mg total) by mouth every 2 (two) hours as needed. 9 tablet 4  . doxepin (SINEQUAN) 50 MG capsule Take 1-2 pills at bedtime for sleep and itching (Patient not taking: Reported on 06/15/2016) 180 capsule 3  . valACYclovir (VALTREX) 1000 MG tablet Take 1 tablet (1,000 mg total) by mouth 3 (three) times daily. (Patient not taking: Reported on 06/15/2016) 21 tablet 0   No current facility-administered medications on file prior to visit.     ROS ROS otherwise unremarkable unless listed above.  Physical Examination: BP 133/82   Pulse 80   Temp 98.5 F (36.9 C) (Oral)   Resp 18   Ht 5' 4" (1.626 m)   Wt 143 lb 12.8 oz (65.2 kg)   SpO2 99%   BMI 24.68 kg/m  Ideal Body Weight: Weight in (lb) to have BMI = 25: 145.3  Physical Exam  Constitutional: She is oriented to person, place, and time. She appears well-developed and well-nourished. No distress.  HENT:  Head: Normocephalic and atraumatic.  Right Ear: External ear normal.  Left Ear: External ear normal.  Eyes: Conjunctivae and EOM are normal. Pupils are equal, round, and reactive to light.  Neck: No thyroid mass and no thyromegaly present.  Cardiovascular: Normal rate and regular rhythm.  Exam reveals no gallop and no friction rub.   No murmur heard. Pulses:      Radial pulses are 2+ on the right side, and 2+ on the left side.       Dorsalis pedis pulses are 2+ on the right side, and 2+ on the left side.  Pulmonary/Chest: Effort normal. No respiratory distress.  Neurological: She is alert and oriented to person, place, and time.  Skin: She is not diaphoretic.  Psychiatric: She has a normal mood and affect. Her behavior is normal.     Assessment and Plan: [name] is a 31 y.o. female who is here today for cc of medication refill.   Other fatigue - Plan: Thyroid Panel With TSH, CMP14+EGFR, SUMAtriptan (IMITREX) 100 MG tablet, DULoxetine  (CYMBALTA) 60 MG capsule, clonazePAM (KLONOPIN) 0.5 MG tablet  Generalized anxiety disorder - Plan: SUMAtriptan (IMITREX) 100 MG tablet, DULoxetine (CYMBALTA) 60 MG capsule, clonazePAM (KLONOPIN) 0.5 MG tablet  Ivar Drape, PA-C [facility] 4/22/20187:52 AM

## 2016-06-16 DIAGNOSIS — R5383 Other fatigue: Secondary | ICD-10-CM

## 2016-06-16 DIAGNOSIS — F411 Generalized anxiety disorder: Secondary | ICD-10-CM

## 2016-06-16 LAB — CMP14+EGFR
ALT: 12 IU/L (ref 0–32)
AST: 13 IU/L (ref 0–40)
Albumin/Globulin Ratio: 1.7 (ref 1.2–2.2)
Albumin: 4.1 g/dL (ref 3.5–5.5)
Alkaline Phosphatase: 86 IU/L (ref 39–117)
BUN/Creatinine Ratio: 21 (ref 9–23)
BUN: 13 mg/dL (ref 6–20)
Bilirubin Total: 0.4 mg/dL (ref 0.0–1.2)
CO2: 25 mmol/L (ref 18–29)
Calcium: 9.1 mg/dL (ref 8.7–10.2)
Chloride: 99 mmol/L (ref 96–106)
Creatinine, Ser: 0.62 mg/dL (ref 0.57–1.00)
GFR calc Af Amer: 138 mL/min/{1.73_m2} (ref >59)
GFR calc non Af Amer: 120 mL/min/{1.73_m2} (ref >59)
Globulin, Total: 2.4 g/dL (ref 1.5–4.5)
Glucose: 94 mg/dL (ref 65–99)
Potassium: 4.3 mmol/L (ref 3.5–5.2)
Sodium: 138 mmol/L (ref 134–144)
Total Protein: 6.5 g/dL (ref 6.0–8.5)

## 2016-06-16 LAB — THYROID PANEL WITH TSH
Free Thyroxine Index: 1.3 (ref 1.2–4.9)
T3 Uptake Ratio: 25 % (ref 24–39)
T4, Total: 5.1 ug/dL (ref 4.5–12.0)
TSH: 2.13 u[IU]/mL (ref 0.450–4.500)

## 2016-07-03 ENCOUNTER — Other Ambulatory Visit: Payer: Self-pay

## 2016-07-03 DIAGNOSIS — R5383 Other fatigue: Secondary | ICD-10-CM

## 2016-07-03 DIAGNOSIS — F411 Generalized anxiety disorder: Secondary | ICD-10-CM

## 2016-07-03 NOTE — Telephone Encounter (Signed)
06/15/16 last rx with 1 refill   lmtcb at pharmacy to clarify and give hx of fills

## 2016-07-06 MED ORDER — CLONAZEPAM 0.5 MG PO TABS: 0.5000 mg | ORAL_TABLET | Freq: Every day | ORAL | 2 refills | Status: DC | PRN

## 2016-07-06 NOTE — Telephone Encounter (Signed)
I gave it to her with 2 refills.  Fine to fill

## 2016-07-06 NOTE — Telephone Encounter (Signed)
Refill req for Clonazepam Do you want pt to continue?

## 2016-07-06 NOTE — Telephone Encounter (Signed)
Pharmacy reminded of 4/25 refill with 2 additional

## 2016-07-16 ENCOUNTER — Telehealth: Payer: Self-pay | Admitting: Physician Assistant

## 2016-07-16 NOTE — Telephone Encounter (Signed)
Error patient is going to come in to be seen on 07/16/16

## 2016-07-17 ENCOUNTER — Ambulatory Visit (INDEPENDENT_AMBULATORY_CARE_PROVIDER_SITE_OTHER): Payer: BLUE CROSS/BLUE SHIELD | Admitting: Physician Assistant

## 2016-07-17 ENCOUNTER — Telehealth: Payer: Self-pay | Admitting: Physician Assistant

## 2016-07-17 ENCOUNTER — Encounter: Payer: Self-pay | Admitting: Physician Assistant

## 2016-07-17 VITALS — BP 138/85 | HR 90 | Temp 98.2°F | Resp 17 | Ht 64.0 in | Wt 143.0 lb

## 2016-07-17 DIAGNOSIS — J0140 Acute pansinusitis, unspecified: Secondary | ICD-10-CM | POA: Diagnosis not present

## 2016-07-17 DIAGNOSIS — M7660 Achilles tendinitis, unspecified leg: Secondary | ICD-10-CM | POA: Diagnosis not present

## 2016-07-17 MED ORDER — GUAIFENESIN ER 1200 MG PO TB12: 1.0000 | ORAL_TABLET | Freq: Two times a day (BID) | ORAL | 1 refills | Status: DC | PRN

## 2016-07-17 MED ORDER — AMOXICILLIN-POT CLAVULANATE 875-125 MG PO TABS: 1.0000 | ORAL_TABLET | Freq: Two times a day (BID) | ORAL | 0 refills | Status: AC

## 2016-07-17 NOTE — Patient Instructions (Addendum)
Please take the antibiotics as prescribed.   I would like you to take the claritin, zyrtec, or allegra that you have at home once per day. Make sure that you are hydrating well with 64 oz of water. I would like you to ice the foot three times per day for 15 minutes Take the mobic once daily.   Please get a heel cup from the pharmacy.  Sinusitis, Adult Sinusitis is soreness and inflammation of your sinuses. Sinuses are hollow spaces in the bones around your face. They are located:  Around your eyes.  In the middle of your forehead.  Behind your nose.  In your cheekbones. Your sinuses and nasal passages are lined with a stringy fluid (mucus). Mucus normally drains out of your sinuses. When your nasal tissues get inflamed or swollen, the mucus can get trapped or blocked so air cannot flow through your sinuses. This lets bacteria, viruses, and funguses grow, and that leads to infection. Follow these instructions at home: Medicines   Take, use, or apply over-the-counter and prescription medicines only as told by your doctor. These may include nasal sprays.  If you were prescribed an antibiotic medicine, take it as told by your doctor. Do not stop taking the antibiotic even if you start to feel better. Hydrate and Humidify   Drink enough water to keep your pee (urine) clear or pale yellow.  Use a cool mist humidifier to keep the humidity level in your home above 50%.  Breathe in steam for 10-15 minutes, 3-4 times a day or as told by your doctor. You can do this in the bathroom while a hot shower is running.  Try not to spend time in cool or dry air. Rest   Rest as much as possible.  Sleep with your head raised (elevated).  Make sure to get enough sleep each night. General instructions   Put a warm, moist washcloth on your face 3-4 times a day or as told by your doctor. This will help with discomfort.  Wash your hands often with soap and water. If there is no soap and water, use  hand sanitizer.  Do not smoke. Avoid being around people who are smoking (secondhand smoke).  Keep all follow-up visits as told by your doctor. This is important. Contact a doctor if:  You have a fever.  Your symptoms get worse.  Your symptoms do not get better within 10 days. Get help right away if:  You have a very bad headache.  You cannot stop throwing up (vomiting).  You have pain or swelling around your face or eyes.  You have trouble seeing.  You feel confused.  Your neck is stiff.  You have trouble breathing. This information is not intended to replace advice given to you by your health care provider. Make sure you discuss any questions you have with your health care provider. Document Released: 08/01/2007 Document Revised: 10/09/2015 Document Reviewed: 12/08/2014 Elsevier Interactive Patient Education  2017 [employer].     IF you received an x-ray today, you will receive an invoice from [facility]. Please contact [facility] at [phone] with questions or concerns regarding your invoice.   IF you received labwork today, you will receive an invoice from [place]. Please contact [facility] at [phone] with questions or concerns regarding your invoice.   Our billing staff will not be able to assist you with questions regarding bills from these companies.  You will be contacted with the lab results as soon as they are  available. The fastest way to get your results is to activate your My Chart account. Instructions are located on the last page of this paperwork. If you have not heard from us regarding the results in 2 weeks, please contact this office.      

## 2016-07-17 NOTE — Telephone Encounter (Signed)
ENGLISH - Pt was seen this morning and she says you discussed a sleep medication, but it was not called in.  She did get the augmentin and mucinex.  Please call her at [phone]

## 2016-07-18 MED ORDER — HYDROXYZINE HCL 25 MG PO TABS: 25.0000 mg | ORAL_TABLET | Freq: Every evening | ORAL | 1 refills | Status: DC | PRN

## 2016-07-18 NOTE — Progress Notes (Signed)
PRIMARY CARE AT [facility] [address], [place] [address] [phone]  Date:  07/17/2016   Name:  [name]   DOB:  Jan 13, 1986   MRN:  [id number]  PCP:  Patient, No Pcp Per    History of Present Illness:  [name] is a 31 y.o. female patient who presents to PCP with  Chief Complaint  Patient presents with  . Sinusitis     Patient states that she has significant sinus pain for over 1 week.  There is sinus pain along her cheeks and forehead.  She feels a general malaise, however no fever.  She has thick mucus.  She may have occasional cough, but more post nasal drip.  Some ear discomfort and mild sore thraot.   Complains of the back of ehr heel pain that has progressively worsened over several weeks.  She notes a swelling there that worsens throughout the day.  No redness, or warmth to the area.   Patient Active Problem List   Diagnosis Date Noted  . GAD (generalized anxiety disorder) 09/17/2014  . ACL graft tear ([place]) 08/10/2014  . S/P ACL reconstruction 08/10/2014  . Migraine headache 05/15/2010    Past Medical History:  Diagnosis Date  . ACL graft tear ([facility])    left knee  . Allergy   . Anxiety   . Complication of anesthesia    woke up during surgery  . Family history of adverse reaction to anesthesia     mom is difficult to put to sleep  . IUD (intrauterine device) in place   . Knee pain, right   . Migraine headache   . Sleep paralysis     Past Surgical History:  Procedure Laterality Date  . ANTERIOR CRUCIATE LIGAMENT REPAIR Left 08/10/2014   Procedure: ANTERIOR CRUCIATE LIGAMENT (ACL) REPAIR;  Surgeon: Sydnee Cabal, MD;  Location: [place];  Service: Orthopedics;  Laterality: Left;  . KNEE ARTHROSCOPY Left 08/10/2014   Procedure: ARTHROSCOPY KNEE DEBRIDEMENT ALLOGRAFT ACL REVISION RECONSTRUCTION;  Surgeon: Sydnee Cabal, MD;  Location: [facility];  Service: Orthopedics;  Laterality: Left;  . KNEE ARTHROSCOPY W/ ACL  RECONSTRUCTION      Social History  Substance Use Topics  . Smoking status: Never Smoker  . Smokeless tobacco: Never Used  . Alcohol use 2.4 oz/week    4 Glasses of wine per week    Family History  Problem Relation Age of Onset  . Goiter Father   . Hyperlipidemia Father   . Hypertension Maternal Grandmother   . Stroke Maternal Grandfather   . Cancer Maternal Grandfather   . Stroke Paternal Grandmother     No Known Allergies  Medication list has been reviewed and updated.  Current Outpatient Prescriptions on File Prior to Visit  Medication Sig Dispense Refill  . clonazePAM (KLONOPIN) 0.5 MG tablet Take 1 tablet (0.5 mg total) by mouth daily as needed for anxiety. 30 tablet 2  . DULoxetine (CYMBALTA) 60 MG capsule Take 1 capsule (60 mg total) by mouth daily. 90 capsule 3  . SUMAtriptan (IMITREX) 100 MG tablet Take 1 tablet (100 mg total) by mouth every 2 (two) hours as needed. Max 262m over 24 hours. 20 tablet 4   No current facility-administered medications on file prior to visit.     ROS ROS otherwise unremarkable unless listed above.  Physical Examination: BP 138/85   Pulse 90   Temp 98.2 F (36.8 C) (Oral)   Resp 17   Ht 5' 4"  (1.626  m)   Wt 143 lb (64.9 kg)   SpO2 98%   BMI 24.55 kg/m  Ideal Body Weight: Weight in (lb) to have BMI = 25: 145.3  Physical Exam  Constitutional: She is oriented to person, place, and time. She appears well-developed and well-nourished. No distress.  HENT:  Head: Normocephalic and atraumatic.  Right Ear: Tympanic membrane, external ear and ear canal normal.  Left Ear: Tympanic membrane, external ear and ear canal normal.  Nose: Mucosal edema and rhinorrhea present. Right sinus exhibits maxillary sinus tenderness. Right sinus exhibits no frontal sinus tenderness. Left sinus exhibits maxillary sinus tenderness. Left sinus exhibits no frontal sinus tenderness.  Mouth/Throat: No uvula swelling. No oropharyngeal exudate, posterior  oropharyngeal edema or posterior oropharyngeal erythema.  Eyes: Conjunctivae and EOM are normal. Pupils are equal, round, and reactive to light.  Cardiovascular: Normal rate and regular rhythm.  Exam reveals no gallop, no distant heart sounds and no friction rub.   No murmur heard. Pulses:      Radial pulses are 2+ on the right side, and 2+ on the left side.  Pulmonary/Chest: Effort normal. No respiratory distress. She has no decreased breath sounds. She has no wheezes. She has no rhonchi.  Musculoskeletal:  Achilles tenderness with prominent mobile swelling, that is mildly tender to touch.  Normal rom.  And normal resisted strength.  Thompson test normal.  Lymphadenopathy:       Head (right side): No submandibular, no tonsillar, no preauricular and no posterior auricular adenopathy present.       Head (left side): No submandibular, no tonsillar, no preauricular and no posterior auricular adenopathy present.  Neurological: She is alert and oriented to person, place, and time.  Skin: She is not diaphoretic.  Psychiatric: She has a normal mood and affect. Her behavior is normal.     Assessment and Plan: [name] is a 31 y.o. female who is here today for sinus and heel pain. Sinusitis treating for bacterial etioloty. Heel pain tendinopathy, vs, heel bursitis. Advised treating with anti-inflammatory, heel cups and icing three times per day.  If still painful, may image after 2 weeks.   Acute non-recurrent pansinusitis - Plan: amoxicillin-clavulanate (AUGMENTIN) 875-125 MG tablet, Guaifenesin (MUCINEX MAXIMUM STRENGTH) 1200 MG TB12  Achilles tendon pain  Shannon Drape, PA-C [facility] 5/24/20182:14 PM

## 2016-07-18 NOTE — Telephone Encounter (Signed)
Medication sent to pharmacy.  Please alert patient

## 2016-07-19 NOTE — Telephone Encounter (Signed)
Pt advised.

## 2016-09-22 ENCOUNTER — Other Ambulatory Visit: Payer: Self-pay | Admitting: Physician Assistant

## 2016-10-23 ENCOUNTER — Encounter: Payer: Self-pay | Admitting: Physician Assistant

## 2016-10-23 ENCOUNTER — Ambulatory Visit (INDEPENDENT_AMBULATORY_CARE_PROVIDER_SITE_OTHER): Payer: BLUE CROSS/BLUE SHIELD | Admitting: Physician Assistant

## 2016-10-23 VITALS — BP 128/88 | HR 78 | Temp 99.0°F | Resp 18 | Ht 64.0 in | Wt 143.0 lb

## 2016-10-23 DIAGNOSIS — F418 Other specified anxiety disorders: Secondary | ICD-10-CM

## 2016-10-23 DIAGNOSIS — M79671 Pain in right foot: Secondary | ICD-10-CM | POA: Diagnosis not present

## 2016-10-23 DIAGNOSIS — J0140 Acute pansinusitis, unspecified: Secondary | ICD-10-CM

## 2016-10-23 DIAGNOSIS — R51 Headache: Secondary | ICD-10-CM | POA: Diagnosis not present

## 2016-10-23 DIAGNOSIS — R519 Headache, unspecified: Secondary | ICD-10-CM

## 2016-10-23 MED ORDER — DULOXETINE HCL 30 MG PO CPEP: 30.0000 mg | ORAL_CAPSULE | Freq: Every day | ORAL | 0 refills | Status: DC

## 2016-10-23 MED ORDER — TIZANIDINE HCL 2 MG PO CAPS: 2.0000 mg | ORAL_CAPSULE | Freq: Three times a day (TID) | ORAL | 1 refills | Status: DC

## 2016-10-23 MED ORDER — PREDNISONE 20 MG PO TABS: ORAL_TABLET | ORAL | 0 refills | Status: AC

## 2016-10-23 MED ORDER — GUAIFENESIN ER 1200 MG PO TB12: 1.0000 | ORAL_TABLET | Freq: Two times a day (BID) | ORAL | 1 refills | Status: AC | PRN

## 2016-10-23 MED ORDER — ESCITALOPRAM OXALATE 10 MG PO TABS: 10.0000 mg | ORAL_TABLET | Freq: Every day | ORAL | 1 refills | Status: DC

## 2016-10-23 NOTE — Patient Instructions (Addendum)
  We are going to decrease the Cymbalta to 30 mg, while we start the lexapro at 10 mg daily.   We will do this for 2 weeks and stop the Cymbalta.  We will then just be on the lexapro.  We may increase this to 20 mg at 3 weeks, or your can stay on 10 mg until we meet in 4-6 weeks.   Take the medication for the sinus at that time.  Follow up with me if no improvement in 1 week.  Please also start taking Flonase  Sinus Headache A sinus headache happens when your sinuses become clogged or swollen. You may feel pain or pressure in your face, forehead, ears, or upper teeth. Sinus headaches can be mild or severe. Follow these instructions at home:  Take medicines only as told by your doctor.  If you were given an antibiotic medicine, finish all of it even if you start to feel better.  Use a nose spray if you feel stuffed up (congested).  If told, apply a warm, moist washcloth to your face to help lessen pain. Contact a doctor if:  You get headaches more than one time each week.  Light or sound bothers you.  You have a fever.  You feel sick to your stomach (nauseous) or you throw up (vomit).  Your headaches do not get better with treatment. Get help right away if:  You have trouble seeing.  You suddenly have very bad pain in your face or head.  You start to twitch or shake (seizure).  You are confused.  You have a stiff neck. This information is not intended to replace advice given to you by your health care provider. Make sure you discuss any questions you have with your health care provider. Document Released: 06/14/2010 Document Revised: 10/09/2015 Document Reviewed: 02/08/2014 Elsevier Interactive Patient Education  2018 [employer].    IF you received an x-ray today, you will receive an invoice from [facility]. Please contact [facility] at [phone] with questions or concerns regarding your invoice.   IF you received labwork today, you will receive  an invoice from [facility]. Please contact [facility] at [phone] with questions or concerns regarding your invoice.   Our billing staff will not be able to assist you with questions regarding bills from these companies.  You will be contacted with the lab results as soon as they are available. The fastest way to get your results is to activate your My Chart account. Instructions are located on the last page of this paperwork. If you have not heard from [place] regarding the results in 2 weeks, please contact this office.

## 2016-10-23 NOTE — Progress Notes (Signed)
PRIMARY CARE AT [facility] [address], [place] [address] [phone]  Date:  10/23/2016   Name:  [name]   DOB:  1985-05-03   MRN:  [id number]  PCP:  Patient, No Pcp Per    History of Present Illness:  [name] is a 31 y.o. female patient who presents to PCP with  Chief Complaint  Patient presents with  . Cough    x1week   . Facial Pain    having some pain in one spot on back of head  . Medication Problem    would like to discuss anxiety and depression meds   (screening was a 7)   . Headache    medication   . Foot Injury    spot on left foot has a bump and it hurts      Woke with pain 1 week ago, with improvement over the last 3 days, but it worsened today.  She is coughing up sputum.  Coughing fits make her urination.  Productive cough of green sputum.  The nasal drainage has been the same color.  Pressure on face, feels pressure at chest.  She has headache and lethargic.  No nose bleeds.  The mucus is somewhat thick.  She has some ear pain and are pruritic.  She has taken mucinex, and benadryl.  The benadryl made her very tired through the day.  No dyspnea and sob.    Anxiety/depression: taking Cymbalta.  She feels like the depression has been difficult.  There is no heavy stressors at this time.  Intermittent moments of sleeping and insomnia.  Racing thoughts.  She will take the klonopin which is helping with her sleep.  No LMP recorded. Patient is not currently having periods (Reason: IUD).  1 year IUD placed.  Has some weight gain.  She is not exercising at this time, because she feels tired.  No si/hi.  Headache: neck pain at the left side that radiates up the left side of here head.  She will fill this head pain that starts her migraine.    Foot: bump on back of feel.  Feels like a knot at the right foot.  Intermittently flares for months.  Hurts with walking down stairs.   She has not done anything for the pain.    Wt Readings from Last 3 Encounters:  10/23/16  143 lb (64.9 kg)  07/17/16 143 lb (64.9 kg)  06/15/16 143 lb 12.8 oz (65.2 kg)    Patient Active Problem List   Diagnosis Date Noted  . GAD (generalized anxiety disorder) 09/17/2014  . ACL graft tear ([place]) 08/10/2014  . S/P ACL reconstruction 08/10/2014  . Migraine headache 05/15/2010    Past Medical History:  Diagnosis Date  . ACL graft tear ([facility])    left knee  . Allergy   . Anxiety   . Complication of anesthesia    woke up during surgery  . Family history of adverse reaction to anesthesia     mom is difficult to put to sleep  . IUD (intrauterine device) in place   . Knee pain, right   . Migraine headache   . Sleep paralysis     Past Surgical History:  Procedure Laterality Date  . ANTERIOR CRUCIATE LIGAMENT REPAIR Left 08/10/2014   Procedure: ANTERIOR CRUCIATE LIGAMENT (ACL) REPAIR;  Surgeon: Sydnee Cabal, MD;  Location: [place];  Service: Orthopedics;  Laterality: Left;  . KNEE ARTHROSCOPY Left 08/10/2014   Procedure: ARTHROSCOPY KNEE DEBRIDEMENT ALLOGRAFT  ACL REVISION RECONSTRUCTION;  Surgeon: Sydnee Cabal, MD;  Location: [facility];  Service: Orthopedics;  Laterality: Left;  . KNEE ARTHROSCOPY W/ ACL RECONSTRUCTION      Social History  Substance Use Topics  . Smoking status: Never Smoker  . Smokeless tobacco: Never Used  . Alcohol use 2.4 oz/week    4 Glasses of wine per week    Family History  Problem Relation Age of Onset  . Goiter Father   . Hyperlipidemia Father   . Hypertension Maternal Grandmother   . Stroke Maternal Grandfather   . Cancer Maternal Grandfather   . Stroke Paternal Grandmother     No Known Allergies  Medication list has been reviewed and updated.  Current Outpatient Prescriptions on File Prior to Visit  Medication Sig Dispense Refill  . clonazePAM (KLONOPIN) 0.5 MG tablet Take 1 tablet (0.5 mg total) by mouth daily as needed for anxiety. 30 tablet 2  . DULoxetine (CYMBALTA) 60 MG capsule Take  1 capsule (60 mg total) by mouth daily. 90 capsule 3  . Guaifenesin (MUCINEX MAXIMUM STRENGTH) 1200 MG TB12 Take 1 tablet (1,200 mg total) by mouth every 12 (twelve) hours as needed. 14 tablet 1  . hydrOXYzine (ATARAX/VISTARIL) 25 MG tablet take 1 to 3 tablets by mouth at bedtime if needed for itching 30 tablet 0  . SUMAtriptan (IMITREX) 100 MG tablet Take 1 tablet (100 mg total) by mouth every 2 (two) hours as needed. Max 200mg  over 24 hours. 20 tablet 4   No current facility-administered medications on file prior to visit.     ROS ROS otherwise unremarkable unless listed above.  Physical Examination: BP 128/88   Pulse 78   Temp 99 F (37.2 C) (Oral)   Resp 18   Ht 5\' 4"  (1.626 m)   Wt 143 lb (64.9 kg)   SpO2 98%   BMI 24.55 kg/m  Ideal Body Weight: Weight in (lb) to have BMI = 25: 145.3  Physical Exam  Constitutional: She is oriented to person, place, and time. She appears well-developed and well-nourished. No distress.  HENT:  Head: Normocephalic and atraumatic.  Right Ear: External ear and ear canal normal. A middle ear effusion is present.  Left Ear: External ear and ear canal normal. A middle ear effusion is present.  Nose: No mucosal edema or rhinorrhea. Right sinus exhibits maxillary sinus tenderness. Right sinus exhibits no frontal sinus tenderness. Left sinus exhibits maxillary sinus tenderness. Left sinus exhibits no frontal sinus tenderness.  Mouth/Throat: No uvula swelling. Posterior oropharyngeal erythema (mild) present. No oropharyngeal exudate or posterior oropharyngeal edema. Tonsils are 0 on the right. Tonsils are 0 on the left.  Clear effusion behind tm. bilaterally  Eyes: Pupils are equal, round, and reactive to light. Conjunctivae and EOM are normal.  Neck:  Normal rom of head and neck.  Slight spasming of muscle palpated atht e lower trapezius area of the left side.  No scalp tenderness.  Cardiovascular: Normal rate and regular rhythm.  Exam reveals no  gallop, no distant heart sounds and no friction rub.   No murmur heard. Pulmonary/Chest: Effort normal. No accessory muscle usage. No apnea. No respiratory distress. She has no decreased breath sounds. She has no wheezes. She has no rhonchi.  Musculoskeletal:       Right ankle: Achilles tendon exhibits normal Thompson's test results.       Right foot: There is tenderness (slight tender nodule at the achilles area--upon palpation.  it is appreciable near where  the bursa may lie.  no erythema or obvious swelling.  normal rom. ). There is normal range of motion.  Lymphadenopathy:       Head (right side): No submandibular, no tonsillar, no preauricular and no posterior auricular adenopathy present.       Head (left side): No submandibular, no tonsillar, no preauricular and no posterior auricular adenopathy present.  Neurological: She is alert and oriented to person, place, and time.  Skin: She is not diaphoretic.  Psychiatric: She has a normal mood and affect. Her behavior is normal.     Assessment and Plan: [name] is a 31 y.o. female who is here today for  Chief Complaint  Patient presents with  . Cough    x1week   . Facial Pain    having some pain in one spot on back of head  . Medication Problem    would like to discuss anxiety and depression meds   (screening was a 7)   . Headache    medication   . Foot Injury    spot on left foot has a bump and it hurts    --we will attempt zanaflex to help in this pain reported, and may help avoid the onset of migrainous symptoms. --likely not infection but inflamed and causing eustachian tube dysfunction and effusion.  Short prednisone taper given.  Advised to also start Flonase at this time.  rtc in 1 week if sxs do not improve. --foot appears to be achilles tendon small calcification probable or bursitis.  Will ice and use NSAID with onsets.  Also advised to try heel cups when this starts to flare to see if this may help her  symptoms. --we will also try lexapro at this time.  Lower dose to Cymbalta 30 mg from 60 mg.  She will do this for 2 weeks, while starting the lexapro 10 mg.  She can do 10 mg only of lexapro for week 3 and may increase following this.  She will then return at 4-5 weeks.  Advised of concerning symptoms of serotonin syndrome.   Acute pansinusitis, recurrence not specified - Plan: predniSONE (DELTASONE) 20 MG tablet, Guaifenesin (MUCINEX MAXIMUM STRENGTH) 1200 MG TB12  Nonintractable episodic headache, unspecified headache type - Plan: tizanidine (ZANAFLEX) 2 MG capsule  Depression with anxiety - Plan: DULoxetine (CYMBALTA) 30 MG capsule, escitalopram (LEXAPRO) 10 MG tablet  Pain of right heel  Ivar Drape, PA-C [facility] 8/30/201810:13 AM  Ivar Drape, PA-C [facility] 8/30/201810:11 AM

## 2016-11-07 ENCOUNTER — Other Ambulatory Visit: Payer: Self-pay | Admitting: Physician Assistant

## 2016-11-13 ENCOUNTER — Telehealth: Payer: Self-pay | Admitting: *Deleted

## 2016-11-13 DIAGNOSIS — F411 Generalized anxiety disorder: Secondary | ICD-10-CM

## 2016-11-13 DIAGNOSIS — R5383 Other fatigue: Secondary | ICD-10-CM

## 2016-11-13 MED ORDER — CLONAZEPAM 0.5 MG PO TABS: 0.5000 mg | ORAL_TABLET | Freq: Every day | ORAL | 2 refills | Status: DC | PRN

## 2016-11-13 NOTE — Telephone Encounter (Signed)
Done

## 2016-11-13 NOTE — Telephone Encounter (Signed)
[facility] sent over a fax requesting Clonazepam 1 mg tablet

## 2016-11-13 NOTE — Telephone Encounter (Signed)
Ordered  please fax

## 2016-11-26 ENCOUNTER — Telehealth: Payer: Self-pay | Admitting: Physician Assistant

## 2016-11-26 DIAGNOSIS — G4459 Other complicated headache syndrome: Secondary | ICD-10-CM

## 2016-11-26 DIAGNOSIS — M542 Cervicalgia: Secondary | ICD-10-CM

## 2016-11-26 NOTE — Telephone Encounter (Signed)
Pt called requesting referral for Neurology to see Dr. Jaynee Eagles. Please advise. Pt can be reached at [phone]. Thanks!

## 2016-11-27 NOTE — Telephone Encounter (Signed)
If this is for migraines, I would recommend the headache clinic.  If not, she would need to come in.

## 2016-11-27 NOTE — Telephone Encounter (Signed)
Left message on voicemail to return office call. Dgaddy, CMA 

## 2016-11-28 NOTE — Telephone Encounter (Signed)
LMOM to call the ofc regarding request for referral.

## 2016-11-29 NOTE — Telephone Encounter (Signed)
Pt called back and said she is still having the pain on the top of her shoulder blade that goes into her head and her dad and uncle have had a history of this as well. She said she did discuss this at her last visit and was wanting a neuro referral to get this taken care of because her pain is getting worse. I told her I would put a message in letting [name] know this and that if we did still need her to come in to be seen someone would contact her. Thanks!

## 2016-11-30 NOTE — Telephone Encounter (Signed)
Yes, this is what may exacerbate her headache.  I will send her to neurology.  Please advise patient.  Referral placed.

## 2016-12-03 NOTE — Telephone Encounter (Signed)
Left pt message stating referral had been sent. Thanks!

## 2016-12-09 ENCOUNTER — Other Ambulatory Visit: Payer: Self-pay | Admitting: Family Medicine

## 2016-12-11 NOTE — Telephone Encounter (Signed)
Please ask If she needs this medication

## 2016-12-13 NOTE — Telephone Encounter (Signed)
Call placed to patient regarding rx request, phone number is not in service at this time. Per chart, rx filled 10/2016 by Dr Tamala Julian

## 2017-01-08 ENCOUNTER — Other Ambulatory Visit: Payer: Self-pay | Admitting: Physician Assistant

## 2017-01-08 DIAGNOSIS — F418 Other specified anxiety disorders: Secondary | ICD-10-CM

## 2017-01-08 NOTE — Telephone Encounter (Signed)
Copied from [place] [phone]. Topic: Inquiry >> Jan 08, 2017  3:27 PM Oliver Pila B wrote: Reason for CRM: PT stated that her pharmacy has sent over a Rx for refill, just wanted to make sure her pcp see's it so that it can get filled, have pharmacy contact pt when Rx is ready

## 2017-01-08 NOTE — Telephone Encounter (Signed)
LOV 10/23/16 with Ivar Drape / Patient asking for refill of Lexapro /

## 2017-02-06 ENCOUNTER — Other Ambulatory Visit: Payer: Self-pay | Admitting: Physician Assistant

## 2017-02-06 DIAGNOSIS — R5383 Other fatigue: Secondary | ICD-10-CM

## 2017-02-06 DIAGNOSIS — F411 Generalized anxiety disorder: Secondary | ICD-10-CM

## 2017-02-06 DIAGNOSIS — R51 Headache: Secondary | ICD-10-CM

## 2017-02-06 DIAGNOSIS — R519 Headache, unspecified: Secondary | ICD-10-CM

## 2017-02-08 NOTE — Telephone Encounter (Signed)
Medications need approval. Last OV 10/23/16.Thanks.

## 2017-03-05 ENCOUNTER — Other Ambulatory Visit: Payer: Self-pay | Admitting: Physician Assistant

## 2017-03-05 DIAGNOSIS — R51 Headache: Principal | ICD-10-CM

## 2017-03-05 DIAGNOSIS — F418 Other specified anxiety disorders: Secondary | ICD-10-CM

## 2017-03-05 DIAGNOSIS — F411 Generalized anxiety disorder: Secondary | ICD-10-CM

## 2017-03-05 DIAGNOSIS — R519 Headache, unspecified: Secondary | ICD-10-CM

## 2017-03-05 DIAGNOSIS — R5383 Other fatigue: Secondary | ICD-10-CM

## 2017-04-03 ENCOUNTER — Encounter: Payer: BLUE CROSS/BLUE SHIELD | Admitting: Physician Assistant

## 2017-04-03 ENCOUNTER — Ambulatory Visit (INDEPENDENT_AMBULATORY_CARE_PROVIDER_SITE_OTHER): Payer: BLUE CROSS/BLUE SHIELD | Admitting: Physician Assistant

## 2017-04-03 ENCOUNTER — Encounter: Payer: Self-pay | Admitting: Physician Assistant

## 2017-04-03 ENCOUNTER — Other Ambulatory Visit: Payer: Self-pay

## 2017-04-03 VITALS — BP 114/72 | HR 70 | Temp 98.2°F | Resp 18 | Ht 64.17 in | Wt 152.0 lb

## 2017-04-03 DIAGNOSIS — R635 Abnormal weight gain: Secondary | ICD-10-CM

## 2017-04-03 DIAGNOSIS — J0140 Acute pansinusitis, unspecified: Secondary | ICD-10-CM

## 2017-04-03 DIAGNOSIS — F3289 Other specified depressive episodes: Secondary | ICD-10-CM | POA: Diagnosis not present

## 2017-04-03 DIAGNOSIS — Z13228 Encounter for screening for other metabolic disorders: Secondary | ICD-10-CM | POA: Diagnosis not present

## 2017-04-03 DIAGNOSIS — Z130 Encounter for screening for diseases of the blood and blood-forming organs and certain disorders involving the immune mechanism: Secondary | ICD-10-CM | POA: Diagnosis not present

## 2017-04-03 DIAGNOSIS — F411 Generalized anxiety disorder: Secondary | ICD-10-CM

## 2017-04-03 DIAGNOSIS — Z1329 Encounter for screening for other suspected endocrine disorder: Secondary | ICD-10-CM

## 2017-04-03 DIAGNOSIS — Z0000 Encounter for general adult medical examination without abnormal findings: Secondary | ICD-10-CM

## 2017-04-03 LAB — POCT URINE PREGNANCY: Preg Test, Ur: NEGATIVE

## 2017-04-03 MED ORDER — IPRATROPIUM BROMIDE 0.03 % NA SOLN: 2.0000 | Freq: Two times a day (BID) | NASAL | 0 refills | Status: DC

## 2017-04-03 MED ORDER — DOXYCYCLINE HYCLATE 100 MG PO CAPS: 100.0000 mg | ORAL_CAPSULE | Freq: Two times a day (BID) | ORAL | 0 refills | Status: AC

## 2017-04-03 MED ORDER — BUPROPION HCL ER (SR) 150 MG PO TB12: 150.0000 mg | ORAL_TABLET | Freq: Two times a day (BID) | ORAL | 1 refills | Status: DC

## 2017-04-03 NOTE — Patient Instructions (Addendum)
Keeping You Healthy  Get These Tests 1. Blood Pressure- Have your blood pressure checked once a year by your health care provider.  Normal blood pressure is 120/80. 2. Weight- Have your body mass index (BMI) calculated to screen for obesity.  BMI is measure of body fat based on height and weight.  You can also calculate your own BMI at GravelBags.it. 3. Cholesterol- Have your cholesterol checked every 5 years starting at age 33 then yearly starting at age 39. 65. Chlamydia, HIV, and other sexually transmitted diseases- Get screened every year until age 55, then within three months of each new sexual provider. 5. Pap Test - Every 1-5 years; discuss with your health care provider. 6. Mammogram- Every 1-2 years starting at age 87--50  Take these medicines  Calcium with Vitamin D-Your body needs 1200 mg of Calcium each day and [phone] IU of Vitamin D daily.  Your body can only absorb 500 mg of Calcium at a time so Calcium must be taken in 2 or 3 divided doses throughout the day.  Multivitamin with folic acid- Once daily if it is possible for you to become pregnant.  Get these Immunizations  Gardasil-Series of three doses; prevents HPV related illness such as genital warts and cervical cancer.  Menactra-Single dose; prevents meningitis.  Tetanus shot- Every 10 years.  Flu shot-Every year.  Take these steps 1. Do not smoke-Your healthcare provider can help you quit.  For tips on how to quit go to www.smokefree.gov or call 1-800 QUITNOW. 2. Be physically active- Exercise 5 days a week for at least 30 minutes.  If you are not already physically active, start slow and gradually work up to 30 minutes of moderate physical activity.  Examples of moderate activity include walking briskly, dancing, swimming, bicycling, etc. 3. Breast Cancer- A self breast exam every month is important for early detection of breast cancer.  For more information and instruction on self breast exams, ask  your healthcare provider or https://www.patel.info/. 4. Eat a healthy diet- Eat a variety of healthy foods such as fruits, vegetables, whole grains, low fat milk, low fat cheeses, yogurt, lean meats, poultry and fish, beans, nuts, tofu, etc.  For more information go to www. Thenutritionsource.org 5. Drink alcohol in moderation- Limit alcohol intake to one drink or less per day. Never drink and drive. 6. Depression- Your emotional health is as important as your physical health.  If you're feeling down or losing interest in things you normally enjoy please talk to your healthcare provider about being screened for depression. 7. Dental visit- Brush and floss your teeth twice daily; visit your dentist twice a year. 8. Eye doctor- Get an eye exam at least every 2 years. 9. Helmet use- Always wear a helmet when riding a bicycle, motorcycle, rollerblading or skateboarding. 92. Safe sex- If you may be exposed to sexually transmitted infections, use a condom. 11. Seat belts- Seat belts can save your live; always wear one. 12. Smoke/Carbon Monoxide detectors- These detectors need to be installed on the appropriate level of your home. Replace batteries at least once a year. 13. Skin cancer- When out in the sun please cover up and use sunscreen 15 SPF or higher. 14. Violence- If anyone is threatening or hurting you, please tell your healthcare provider.   We are going to change the medication to wellbutrin.  Decrease to half tablet of the lexapro for 1 week, then stop this.   I would like to see you in 4 weeks.  If you are feeling added anxiety, you may come back sooner. You can take half tablet twice per day instead of the full tablet of the klonopin.  This should provide just as much anxiety relief.  If you do not use the second dosing, you can take it at bedtime.  I want to make sure this wellbutrin is working, so adding more klonopin is not advantageous at this time. Take the  doxycycline for sinus symptoms.  I would also like you to use a humidifier in your roon and nasal saline spray when you are not use the medicated nasal spray.   Keeping You Healthy  Get These Tests 7. Blood Pressure- Have your blood pressure checked once a year by your health care provider.  Normal blood pressure is 120/80. 8. Weight- Have your body mass index (BMI) calculated to screen for obesity.  BMI is measure of body fat based on height and weight.  You can also calculate your own BMI at GravelBags.it. 9. Cholesterol- Have your cholesterol checked every 5 years starting at age 6 then yearly starting at age 81. 77. Chlamydia, HIV, and other sexually transmitted diseases- Get screened every year until age 63, then within three months of each new sexual provider. 11. Pap Test - Every 1-5 years; discuss with your health care provider. 12. Mammogram- Every 1-2 years starting at age 102--50  Take these medicines  Calcium with Vitamin D-Your body needs 1200 mg of Calcium each day and [phone] IU of Vitamin D daily.  Your body can only absorb 500 mg of Calcium at a time so Calcium must be taken in 2 or 3 divided doses throughout the day.  Multivitamin with folic acid- Once daily if it is possible for you to become pregnant.  Get these Immunizations  Gardasil-Series of three doses; prevents HPV related illness such as genital warts and cervical cancer.  Menactra-Single dose; prevents meningitis.  Tetanus shot- Every 10 years.  Flu shot-Every year.  Take these steps 15. Do not smoke-Your healthcare provider can help you quit.  For tips on how to quit go to www.smokefree.gov or call 1-800 QUITNOW. 16. Be physically active- Exercise 5 days a week for at least 30 minutes.  If you are not already physically active, start slow and gradually work up to 30 minutes of moderate physical activity.  Examples of moderate activity include walking briskly, dancing, swimming, bicycling,  etc. 17. Breast Cancer- A self breast exam every month is important for early detection of breast cancer.  For more information and instruction on self breast exams, ask your healthcare provider or https://www.patel.info/. 18. Eat a healthy diet- Eat a variety of healthy foods such as fruits, vegetables, whole grains, low fat milk, low fat cheeses, yogurt, lean meats, poultry and fish, beans, nuts, tofu, etc.  For more information go to www. Thenutritionsource.org 19. Drink alcohol in moderation- Limit alcohol intake to one drink or less per day. Never drink and drive. 82. Depression- Your emotional health is as important as your physical health.  If you're feeling down or losing interest in things you normally enjoy please talk to your healthcare provider about being screened for depression. 21. Dental visit- Brush and floss your teeth twice daily; visit your dentist twice a year. 71. Eye doctor- Get an eye exam at least every 2 years. 23. Helmet use- Always wear a helmet when riding a bicycle, motorcycle, rollerblading or skateboarding. 24. Safe sex- If you may be exposed to sexually transmitted infections, use a condom.  25. Seat belts- Seat belts can save your live; always wear one. 26. Smoke/Carbon Monoxide detectors- These detectors need to be installed on the appropriate level of your home. Replace batteries at least once a year. 27. Skin cancer- When out in the sun please cover up and use sunscreen 15 SPF or higher. 28. Violence- If anyone is threatening or hurting you, please tell your healthcare provider.          IF you received an x-ray today, you will receive an invoice from [facility]. Please contact [facility] at [phone] with questions or concerns regarding your invoice.   IF you received labwork today, you will receive an invoice from [place]. Please contact [facility] at [phone] with questions or concerns regarding your  invoice.   Our billing staff will not be able to assist you with questions regarding bills from these companies.  You will be contacted with the lab results as soon as they are available. The fastest way to get your results is to activate your My Chart account. Instructions are located on the last page of this paperwork. If you have not heard from [place] regarding the results in 2 weeks, please contact this office.

## 2017-04-03 NOTE — Progress Notes (Signed)
PRIMARY CARE AT [facility] [address] [place] [address] [phone]  Date:  04/03/2017   Name:  [name]   DOB:  05/01/1985   MRN:  [id number]  PCP:  Patient, No Pcp Per    History of Present Illness:  [name] is a 32 y.o. female patient who presents to PCP with  Chief Complaint  Patient presents with  . Annual Exam      DIET: She is eating pretty healthy with chicken [place], vegetables.  Avoiding pastas and sweets.    BM: bowel movements have slowed down, to once per day from twice per day.  No black or bloody stool.    URINATION: no dysuria, hematuria, or frequency  SLEEP: takes a while to get to sleep.  She is taking sleep quil at this time to get to sleep.    SOCIAL ACTIVITY She is not leaving the house much.  She does not want to do much.  Working from home.    Decreased sexual activity which she feels is secondary to her the anti-depressant.   Depression --  Notices increase of anxiety.  She would like to change medication as she has also noticed weight gain, despite attempting to manage her diet.  Patient also complains of sinus pain for a couple weeks.  She has congestion and sinus pressure.  She has done otc medications to no avail.  Patient Active Problem List   Diagnosis Date Noted  . GAD (generalized anxiety disorder) 09/17/2014  . ACL graft tear ([place]) 08/10/2014  . S/P ACL reconstruction 08/10/2014  . Migraine headache 05/15/2010    Past Medical History:  Diagnosis Date  . ACL graft tear ([facility])    left knee  . Allergy   . Anxiety   . Complication of anesthesia    woke up during surgery  . Depression   . Family history of adverse reaction to anesthesia     mom is difficult to put to sleep  . IUD (intrauterine device) in place   . Knee pain, right   . Migraine headache   . Sleep paralysis     Past Surgical History:  Procedure Laterality Date  . ANTERIOR CRUCIATE LIGAMENT REPAIR Left 08/10/2014   Procedure: ANTERIOR CRUCIATE LIGAMENT  (ACL) REPAIR;  Surgeon: Sydnee Cabal, MD;  Location: [place];  Service: Orthopedics;  Laterality: Left;  . KNEE ARTHROSCOPY Left 08/10/2014   Procedure: ARTHROSCOPY KNEE DEBRIDEMENT ALLOGRAFT ACL REVISION RECONSTRUCTION;  Surgeon: Sydnee Cabal, MD;  Location: [facility];  Service: Orthopedics;  Laterality: Left;  . KNEE ARTHROSCOPY W/ ACL RECONSTRUCTION      Social History   Tobacco Use  . Smoking status: Never Smoker  . Smokeless tobacco: Never Used  Substance Use Topics  . Alcohol use: Yes    Alcohol/week: 2.4 oz    Types: 4 Glasses of wine per week  . Drug use: No    Family History  Problem Relation Age of Onset  . Goiter Father   . Hyperlipidemia Father   . Hypertension Maternal Grandmother   . Stroke Maternal Grandfather   . Cancer Maternal Grandfather   . Stroke Paternal Grandmother     No Known Allergies  Medication list has been reviewed and updated.  Current Outpatient Medications on File Prior to Visit  Medication Sig Dispense Refill  . clonazePAM (KLONOPIN) 0.5 MG tablet take 1 tablet by mouth once daily if needed for anxiety 30 tablet 2  . escitalopram (LEXAPRO) 10 MG  tablet take 1 tablet by mouth once daily 30 tablet 1  . hydrOXYzine (ATARAX/VISTARIL) 25 MG tablet take 1 to 3 tablets by mouth at bedtime if needed for itching 30 tablet 0  . SUMAtriptan (IMITREX) 100 MG tablet Take 1 tablet (100 mg total) by mouth every 2 (two) hours as needed for migraine. Office visit needed for refills 20 tablet 0  . tizanidine (ZANAFLEX) 2 MG capsule take 1 capsule by mouth three times a day 30 capsule 0  . DULoxetine (CYMBALTA) 30 MG capsule Take 1 capsule (30 mg total) by mouth daily. (Patient not taking: Reported on 04/03/2017) 14 capsule 0   No current facility-administered medications on file prior to visit.     Review of Systems  Constitutional: Negative for chills and fever.  HENT: Negative for ear discharge, ear pain and sore  throat.   Eyes: Negative for blurred vision and double vision.  Respiratory: Negative for cough, shortness of breath and wheezing.   Cardiovascular: Negative for chest pain, palpitations and leg swelling.  Gastrointestinal: Negative for diarrhea, nausea and vomiting.  Genitourinary: Negative for dysuria, frequency and hematuria.  Skin: Negative for itching and rash.  Neurological: Negative for dizziness and headaches.  Psychiatric/Behavioral: Positive for depression. The patient is nervous/anxious and has insomnia.    ROS otherwise unremarkable unless listed above.  Physical Examination: BP 114/72 (BP Location: Right Arm, Patient Position: Sitting, Cuff Size: Normal)   Pulse 70   Temp 98.2 F (36.8 C) (Oral)   Resp 18   Ht 5' 4.17" (1.63 m)   Wt 152 lb (68.9 kg)   SpO2 96%   BMI 25.95 kg/m  Ideal Body Weight: Weight in (lb) to have BMI = 25: 146.1  Physical Exam  Constitutional: She is oriented to person, place, and time. She appears well-developed and well-nourished. No distress.  HENT:  Head: Normocephalic and atraumatic.  Right Ear: Tympanic membrane, external ear and ear canal normal.  Left Ear: Tympanic membrane, external ear and ear canal normal.  Nose: Mucosal edema present. No rhinorrhea. Right sinus exhibits maxillary sinus tenderness. Right sinus exhibits no frontal sinus tenderness. Left sinus exhibits maxillary sinus tenderness. Left sinus exhibits no frontal sinus tenderness.  Mouth/Throat: Oropharynx is clear and moist. No uvula swelling. No oropharyngeal exudate, posterior oropharyngeal edema or posterior oropharyngeal erythema.  Eyes: Conjunctivae and EOM are normal. Pupils are equal, round, and reactive to light.  Neck: Normal range of motion. Neck supple. No thyromegaly present.  Cardiovascular: Normal rate, regular rhythm, normal heart sounds and intact distal pulses. Exam reveals no gallop, no distant heart sounds and no friction rub.  No murmur  heard. Pulmonary/Chest: Effort normal and breath sounds normal. No respiratory distress. She has no decreased breath sounds. She has no wheezes. She has no rhonchi.  Abdominal: Soft. Bowel sounds are normal. She exhibits no distension and no mass. There is no tenderness.  Musculoskeletal: Normal range of motion. She exhibits no edema or tenderness.  Lymphadenopathy:       Head (right side): No submandibular, no tonsillar, no preauricular and no posterior auricular adenopathy present.       Head (left side): No submandibular, no tonsillar, no preauricular and no posterior auricular adenopathy present.    She has no cervical adenopathy.  Neurological: She is alert and oriented to person, place, and time. No cranial nerve deficit. She exhibits normal muscle tone. Coordination normal.  Skin: Skin is warm and dry. She is not diaphoretic.  Psychiatric: She has a normal mood  and affect. Her behavior is normal.     Results for orders placed or performed in visit on 04/03/17  POCT urine pregnancy  Result Value Ref Range   Preg Test, Ur Negative Negative    Assessment and Plan: Janisse Ghan is a 32 y.o. female who is here today for cc of  Chief Complaint  Patient presents with  . Annual Exam  discussed anxiety, and she would like wellbutrin.  Discussed that this may cause anxiety to increase.  May consider effexor if this is not working.  Annual physical exam - Plan: CBC, CMP14+EGFR, TSH  Weight gain - Plan: POCT urine pregnancy  Screening for metabolic disorder - Plan: CMP14+EGFR  Screening for thyroid disorder - Plan: TSH  Screening for deficiency anemia - Plan: CBC  Acute non-recurrent pansinusitis - Plan: doxycycline (VIBRAMYCIN) 100 MG capsule  GAD (generalized anxiety disorder) - Plan: buPROPion (WELLBUTRIN SR) 150 MG 12 hr tablet  Other depression - Plan: buPROPion (WELLBUTRIN SR) 150 MG 12 hr tablet  Ivar Drape, PA-C [facility] 2/19/20199:07 AM

## 2017-04-04 ENCOUNTER — Other Ambulatory Visit: Payer: Self-pay | Admitting: Physician Assistant

## 2017-04-04 DIAGNOSIS — R5383 Other fatigue: Secondary | ICD-10-CM

## 2017-04-04 DIAGNOSIS — F411 Generalized anxiety disorder: Secondary | ICD-10-CM

## 2017-04-04 LAB — CMP14+EGFR
ALT: 18 IU/L (ref 0–32)
AST: 18 IU/L (ref 0–40)
Albumin/Globulin Ratio: 2.2 (ref 1.2–2.2)
Albumin: 5 g/dL (ref 3.5–5.5)
Alkaline Phosphatase: 96 IU/L (ref 39–117)
BUN/Creatinine Ratio: 19 (ref 9–23)
BUN: 10 mg/dL (ref 6–20)
Bilirubin Total: 0.3 mg/dL (ref 0.0–1.2)
CO2: 22 mmol/L (ref 20–29)
Calcium: 9.6 mg/dL (ref 8.7–10.2)
Chloride: 101 mmol/L (ref 96–106)
Creatinine, Ser: 0.54 mg/dL — ABNORMAL LOW (ref 0.57–1.00)
GFR calc Af Amer: 143 mL/min/{1.73_m2} (ref >59)
GFR calc non Af Amer: 124 mL/min/{1.73_m2} (ref >59)
Globulin, Total: 2.3 g/dL (ref 1.5–4.5)
Glucose: 80 mg/dL (ref 65–99)
Potassium: 4.2 mmol/L (ref 3.5–5.2)
Sodium: 140 mmol/L (ref 134–144)
Total Protein: 7.3 g/dL (ref 6.0–8.5)

## 2017-04-04 LAB — CBC
Hematocrit: 42.9 % (ref 34.0–46.6)
Hemoglobin: 14.6 g/dL (ref 11.1–15.9)
MCH: 30.5 pg (ref 26.6–33.0)
MCHC: 34 g/dL (ref 31.5–35.7)
MCV: 90 fL (ref 79–97)
Platelets: 386 10*3/uL — ABNORMAL HIGH (ref 150–379)
RBC: 4.78 x10E6/uL (ref 3.77–5.28)
RDW: 13.2 % (ref 12.3–15.4)
WBC: 9.1 10*3/uL (ref 3.4–10.8)

## 2017-04-04 LAB — TSH: TSH: 3.07 u[IU]/mL (ref 0.450–4.500)

## 2017-04-05 NOTE — Telephone Encounter (Signed)
Imitrex refill Last OV: 04/03/17 Last Refill:02/11/17 Pharmacy:[facility]

## 2017-04-08 ENCOUNTER — Ambulatory Visit: Payer: BLUE CROSS/BLUE SHIELD | Admitting: Physician Assistant

## 2017-04-09 ENCOUNTER — Encounter: Payer: Self-pay | Admitting: Radiology

## 2017-05-06 ENCOUNTER — Other Ambulatory Visit: Payer: Self-pay | Admitting: Physician Assistant

## 2017-05-06 DIAGNOSIS — R5383 Other fatigue: Secondary | ICD-10-CM

## 2017-05-06 DIAGNOSIS — F411 Generalized anxiety disorder: Secondary | ICD-10-CM

## 2017-05-07 ENCOUNTER — Telehealth: Payer: Self-pay | Admitting: Physician Assistant

## 2017-05-07 NOTE — Telephone Encounter (Signed)
°  Called pt- left VM - to remind them of their appt tomorrow. Advised them of the time, building number, early arrival and late policy.

## 2017-05-08 ENCOUNTER — Ambulatory Visit: Payer: BLUE CROSS/BLUE SHIELD | Admitting: Physician Assistant

## 2017-05-08 NOTE — Telephone Encounter (Signed)
Sumatriptan LOV: 04/03/17 PCP: Ivar Drape Pharmacy: [facility]

## 2017-05-09 NOTE — Telephone Encounter (Signed)
She had an appointment yesterday which she did not attend.  I filled this last month.  If she has used them all, then we likely need to get her on something more prophylactic and she should return.  If she is trying to get a refill just to make sure she has some at this time, it is fine to refill.  Can you return her response to me?

## 2017-05-09 NOTE — Telephone Encounter (Signed)
[name], patient was seen on 2/19 do not see mention of medication.   Can we refill? How many?

## 2017-05-10 NOTE — Telephone Encounter (Signed)
LMOVM for pt to call and make appt with [name] re: migraines and Imitrex refill.

## 2017-05-13 ENCOUNTER — Encounter: Payer: Self-pay | Admitting: Physician Assistant

## 2017-05-13 ENCOUNTER — Ambulatory Visit: Payer: BLUE CROSS/BLUE SHIELD | Admitting: Physician Assistant

## 2017-05-13 ENCOUNTER — Other Ambulatory Visit: Payer: Self-pay

## 2017-05-13 VITALS — BP 115/80 | HR 89 | Temp 98.1°F | Resp 16 | Ht 64.0 in | Wt 149.0 lb

## 2017-05-13 DIAGNOSIS — F411 Generalized anxiety disorder: Secondary | ICD-10-CM

## 2017-05-13 DIAGNOSIS — R5383 Other fatigue: Secondary | ICD-10-CM

## 2017-05-13 DIAGNOSIS — F3289 Other specified depressive episodes: Secondary | ICD-10-CM | POA: Diagnosis not present

## 2017-05-13 DIAGNOSIS — Z9109 Other allergy status, other than to drugs and biological substances: Secondary | ICD-10-CM

## 2017-05-13 MED ORDER — SUMATRIPTAN SUCCINATE 100 MG PO TABS: ORAL_TABLET | ORAL | 0 refills | Status: DC

## 2017-05-13 MED ORDER — BUPROPION HCL ER (SR) 150 MG PO TB12: 150.0000 mg | ORAL_TABLET | Freq: Two times a day (BID) | ORAL | 1 refills | Status: DC

## 2017-05-13 NOTE — Progress Notes (Signed)
PRIMARY CARE AT [facility] [address] [place] [address] [phone]  Date:  05/13/2017   Name:  [name]   DOB:  [birth year]   MRN:  [id number]  PCP:  Patient, No Pcp Per    History of Present Illness:  [name] is a 32 y.o. female patient who presents to PCP with  Chief Complaint  Patient presents with  . Medication Management    wellbutrin, follow up, pt states medication is working well  . Sinusitis    follow up, pt states it wasnt her sinuses that was the problem, mold was found in her house     She has just recognized mold in her household.  She notes that a mold inspector is coming today.  She believes this is the cause of her sinus symptoms.  She feels that her face is inflamed and congested.  No fever.  No coughing.  No dyspnea.  She has cut back on klonopin, which she is taking about three times per week.  She thinks the wellbutrin is working at this time.   She states her anxiety is better. She has need imitrex for headaches about 5 times in the last month.   Patient Active Problem List   Diagnosis Date Noted  . GAD (generalized anxiety disorder) 09/17/2014  . ACL graft tear ([place]) 08/10/2014  . S/P ACL reconstruction 08/10/2014  . Migraine headache 05/15/2010    Past Medical History:  Diagnosis Date  . ACL graft tear ([facility])    left knee  . Allergy   . Anxiety   . Complication of anesthesia    woke up during surgery  . Depression   . Family history of adverse reaction to anesthesia     mom is difficult to put to sleep  . IUD (intrauterine device) in place   . Knee pain, right   . Migraine headache   . Sleep paralysis     Past Surgical History:  Procedure Laterality Date  . ANTERIOR CRUCIATE LIGAMENT REPAIR Left 08/10/2014   Procedure: ANTERIOR CRUCIATE LIGAMENT (ACL) REPAIR;  Surgeon: Sydnee Cabal, MD;  Location: [place];  Service: Orthopedics;  Laterality: Left;  . KNEE ARTHROSCOPY Left 08/10/2014   Procedure:  ARTHROSCOPY KNEE DEBRIDEMENT ALLOGRAFT ACL REVISION RECONSTRUCTION;  Surgeon: Sydnee Cabal, MD;  Location: [facility];  Service: Orthopedics;  Laterality: Left;  . KNEE ARTHROSCOPY W/ ACL RECONSTRUCTION      Social History   Tobacco Use  . Smoking status: Never Smoker  . Smokeless tobacco: Never Used  Substance Use Topics  . Alcohol use: Yes    Alcohol/week: 2.4 oz    Types: 4 Glasses of wine per week  . Drug use: No    Family History  Problem Relation Age of Onset  . Goiter Father   . Hyperlipidemia Father   . Hypertension Maternal Grandmother   . Stroke Maternal Grandfather   . Cancer Maternal Grandfather   . Stroke Paternal Grandmother     Allergies  Allergen Reactions  . Molds & Smuts Swelling    Medication list has been reviewed and updated.  Current Outpatient Medications on File Prior to Visit  Medication Sig Dispense Refill  . buPROPion (WELLBUTRIN SR) 150 MG 12 hr tablet Take 1 tablet (150 mg total) by mouth 2 (two) times daily. Start one daily for 3 days, then increase to twice daily 60 tablet 1  . clonazePAM (KLONOPIN) 0.5 MG tablet take 1 tablet by mouth once daily if  needed for anxiety 30 tablet 2  . SUMAtriptan (IMITREX) 100 MG tablet take 1 tablet by mouth immediately may repeat after 2 hours if needed for MIGRAINE 20 tablet 0  . tizanidine (ZANAFLEX) 2 MG capsule take 1 capsule by mouth three times a day 30 capsule 0  . DULoxetine (CYMBALTA) 30 MG capsule Take 1 capsule (30 mg total) by mouth daily. (Patient not taking: Reported on 04/03/2017) 14 capsule 0  . escitalopram (LEXAPRO) 10 MG tablet take 1 tablet by mouth once daily (Patient not taking: Reported on 05/13/2017) 30 tablet 1  . hydrOXYzine (ATARAX/VISTARIL) 25 MG tablet take 1 to 3 tablets by mouth at bedtime if needed for itching (Patient not taking: Reported on 05/13/2017) 30 tablet 0  . ipratropium (ATROVENT) 0.03 % nasal spray Place 2 sprays into both nostrils 2 (two) times daily.  (Patient not taking: Reported on 05/13/2017) 30 mL 0   No current facility-administered medications on file prior to visit.     ROS ROS otherwise unremarkable unless listed above.  Physical Examination: BP 115/80   Pulse 89   Temp 98.1 F (36.7 C) (Oral)   Resp 16   Ht 5' 4"  (1.626 m)   Wt 149 lb (67.6 kg)   SpO2 97%   BMI 25.58 kg/m  Ideal Body Weight: Weight in (lb) to have BMI = 25: 145.3  Physical Exam  Constitutional: She is oriented to person, place, and time. She appears well-developed and well-nourished. No distress.  HENT:  Head: Normocephalic and atraumatic.  Right Ear: External ear normal.  Left Ear: External ear normal.  Eyes: Conjunctivae and EOM are normal. Pupils are equal, round, and reactive to light.  Cardiovascular: Normal rate.  Pulmonary/Chest: Effort normal. No respiratory distress.  Neurological: She is alert and oriented to person, place, and time.  Skin: She is not diaphoretic.  Psychiatric: She has a normal mood and affect. Her behavior is normal.     Assessment and Plan: [name] is a 32 y.o. female who is here today for cc of  Chief Complaint  Patient presents with  . Medication Management    wellbutrin, follow up, pt states medication is working well  . Sinusitis    follow up, pt states it wasnt her sinuses that was the problem, mold was found in her house  --refilled the wellbutrin.   --with inspection pending, advised her to get an air purifier.  She does not want to take an additional medication to calm her allergies.  She would let me know if she wishes for a referral within the month.   Environmental allergies  GAD (generalized anxiety disorder) - Plan: buPROPion (WELLBUTRIN SR) 150 MG 12 hr tablet  Other depression - Plan: buPROPion (WELLBUTRIN SR) 150 MG 12 hr tablet  Other fatigue - Plan: SUMAtriptan (IMITREX) 100 MG tablet  Generalized anxiety disorder - Plan: SUMAtriptan (IMITREX) 100 MG tablet  Ivar Drape,  PA-C [facility] 3/19/20194:05 PM

## 2017-05-13 NOTE — Patient Instructions (Addendum)
Please use air purifier at this time in the home.   I would like you to use this wherever you are in the house.   You can try nasal saline rinses.     IF you received an x-ray today, you will receive an invoice from [facility]. Please contact [facility] at [phone] with questions or concerns regarding your invoice.   IF you received labwork today, you will receive an invoice from [place]. Please contact [facility] at [phone] with questions or concerns regarding your invoice.   Our billing staff will not be able to assist you with questions regarding bills from these companies.  You will be contacted with the lab results as soon as they are available. The fastest way to get your results is to activate your My Chart account. Instructions are located on the last page of this paperwork. If you have not heard from [place] regarding the results in 2 weeks, please contact this office.

## 2017-05-28 ENCOUNTER — Encounter: Payer: Self-pay | Admitting: Physician Assistant

## 2017-06-05 ENCOUNTER — Other Ambulatory Visit: Payer: Self-pay | Admitting: Physician Assistant

## 2017-06-05 DIAGNOSIS — R5383 Other fatigue: Secondary | ICD-10-CM

## 2017-06-05 DIAGNOSIS — F411 Generalized anxiety disorder: Secondary | ICD-10-CM

## 2017-06-06 NOTE — Telephone Encounter (Signed)
Refill of klonopin  LOV 05/13/17  [address] [id number] [place], [place]

## 2017-06-25 ENCOUNTER — Telehealth: Payer: Self-pay | Admitting: Physician Assistant

## 2017-06-25 DIAGNOSIS — R5383 Other fatigue: Secondary | ICD-10-CM

## 2017-06-25 DIAGNOSIS — F411 Generalized anxiety disorder: Secondary | ICD-10-CM

## 2017-06-25 NOTE — Telephone Encounter (Signed)
Copied from [place] [phone]. Topic: Quick Communication - Rx Refill/Question >> Jun 25, 2017  2:37 PM Boyd Kerbs wrote: Medication:   SUMAtriptan (IMITREX) 100 MG tablet clonazePAM (KLONOPIN) 0.5 MG tablet buPROPion (WELLBUTRIN SR) 150 MG 12 hr tablet  She is wanting to see if there could have refills for several months so she does not have to come back in next month or so Please call pt. If can not do this  Has the patient contacted their pharmacy? Yes.   (Agent: If no, request that the patient contact the pharmacy for the refill.) Preferred Pharmacy (with phone number or street name):   [facility] [place] - [address] [place] [address] Phone: [phone] Fax: [phone]   Agent: Please be advised that RX refills may take up to 3 business days. We ask that you follow-up with your pharmacy.

## 2017-06-25 NOTE — Telephone Encounter (Signed)
Request for refill of Klonopin 0.5mg  tab, last filled on 06/07/17 #30 with one refill., Wellbutrin and Sumatiptan(Imitrex). Pt states when the prescription for Imitirex was filled on 05/13/17 she was only given 4 pills even though the dispense amount on the prescription was #20.    Pt stating  she doesn't want to come in and have to pay more money to be seen by another provider when [name] leaves especially since she has already had her physical. Pt notified that she has refills available at the pharmacy for Wellbutrin and Clonazepam. Pt states she is aware of the refills and wants to know if [name] could add on more refills to the current prescription since she is leaving the practice so that she would not have to come in for another visit for the next couple of months.  LOV: 05/13/17 [name]  [facility] in [place]

## 2017-06-26 NOTE — Telephone Encounter (Signed)
Clonazepam 3 months. Unfortunately, She will need to establish care with a new pcp.

## 2017-07-05 ENCOUNTER — Other Ambulatory Visit: Payer: Self-pay

## 2017-07-05 DIAGNOSIS — R5383 Other fatigue: Secondary | ICD-10-CM

## 2017-07-05 DIAGNOSIS — F411 Generalized anxiety disorder: Secondary | ICD-10-CM

## 2017-07-05 MED ORDER — SUMATRIPTAN SUCCINATE 100 MG PO TABS: ORAL_TABLET | ORAL | 0 refills | Status: DC

## 2017-07-05 NOTE — Telephone Encounter (Signed)
Pt called to office re refill.  States she did not receive any notification that she would need to establish with another PCP and has been waiting on refills.  Reviewed medications.  Will need refills for Klonopin, Wellbutrin and Imitrex but primary concern is Imitrex at this time.  Is leaving for business trip today.  Does not feel that she should need to continue coming to office for refills if she has already had CPE this year.  Reviewed policy for OVs to follow meds such as Klonopin and Welbutrin.  Confirmed pharmacy and phone number.  Advised Dr. Pamella Pert of situation. Refilled Imitrex per Dr. Pamella Pert.  Contacted pt to advise refill was sent.  Will need to establish with another PCP to continue being followed for other meds.  Pt will c/b after business trip to set up appt.

## 2017-07-12 ENCOUNTER — Encounter: Payer: Self-pay | Admitting: Family Medicine

## 2017-07-17 ENCOUNTER — Encounter: Payer: Self-pay | Admitting: Family Medicine

## 2017-08-13 ENCOUNTER — Ambulatory Visit: Payer: BLUE CROSS/BLUE SHIELD | Admitting: Family Medicine

## 2017-08-13 ENCOUNTER — Other Ambulatory Visit: Payer: Self-pay

## 2017-08-13 ENCOUNTER — Encounter: Payer: Self-pay | Admitting: Family Medicine

## 2017-08-13 VITALS — BP 102/70 | HR 91 | Temp 98.7°F | Ht 64.57 in | Wt 137.0 lb

## 2017-08-13 DIAGNOSIS — R5383 Other fatigue: Secondary | ICD-10-CM

## 2017-08-13 DIAGNOSIS — G43109 Migraine with aura, not intractable, without status migrainosus: Secondary | ICD-10-CM | POA: Diagnosis not present

## 2017-08-13 DIAGNOSIS — J3489 Other specified disorders of nose and nasal sinuses: Secondary | ICD-10-CM

## 2017-08-13 DIAGNOSIS — F411 Generalized anxiety disorder: Secondary | ICD-10-CM

## 2017-08-13 MED ORDER — AMITRIPTYLINE HCL 25 MG PO TABS: 25.0000 mg | ORAL_TABLET | Freq: Every day | ORAL | 2 refills | Status: DC

## 2017-08-13 MED ORDER — SUMATRIPTAN SUCCINATE 100 MG PO TABS: ORAL_TABLET | ORAL | 0 refills | Status: DC

## 2017-08-13 MED ORDER — FLUTICASONE PROPIONATE 50 MCG/ACT NA SUSP: 1.0000 | Freq: Two times a day (BID) | NASAL | 6 refills | Status: DC

## 2017-08-13 MED ORDER — BUPROPION HCL ER (SR) 150 MG PO TB12: 150.0000 mg | ORAL_TABLET | Freq: Two times a day (BID) | ORAL | 1 refills | Status: DC

## 2017-08-13 MED ORDER — CLONAZEPAM 0.5 MG PO TABS: 0.5000 mg | ORAL_TABLET | Freq: Every day | ORAL | 0 refills | Status: DC | PRN

## 2017-08-13 NOTE — Patient Instructions (Signed)
     IF you received an x-ray today, you will receive an invoice from [facility]. Please contact [facility] at [phone] with questions or concerns regarding your invoice.   IF you received labwork today, you will receive an invoice from [facility]. Please contact [facility] at [phone] with questions or concerns regarding your invoice.   Our billing staff will not be able to assist you with questions regarding bills from these companies.  You will be contacted with the lab results as soon as they are available. The fastest way to get your results is to activate your My Chart account. Instructions are located on the last page of this paperwork. If you have not heard from us regarding the results in 2 weeks, please contact this office.     

## 2017-08-13 NOTE — Progress Notes (Signed)
6/18/201912:00 PM  [name] [birth year], 32 y.o. female [id number]  Chief Complaint  Patient presents with  . Establish Care    PCP was Ivar Drape    HPI:   Patient is a 32 y.o. female with past medical history significant for anxiety and migrianes who presents today to establish care  Migraines - since age 86, has only been on imitrex. Has h/o occassional auras. Reports unilateral, behind eye, stabbing pain, radiates towards back of his head. + nausea, no vomiting. + photophobia. No vision changes. Triggers are sinus issues and neck pain. Recently sinus issues have been uncontrolled as she bought a house with a mold issue. This has been treated and resolved. She is currently getting about 4 migraines a week.  Sinus issues - became significant since she moved into new home. azelastine not helping. Has not tried anything else. Constant pressure. No drainage. No odd odor  Anxiety - ok on wellbutrin. Takes clonazepam prn, used to previously be on Cymbalta, did well for year and then seemed to stop working. Tried lexapro - made her very angry. TSH normal in feb 2019   csr reviewed, last rx 06/09/17 # 30  Fall Risk  08/13/2017 04/03/2017 10/23/2016 07/17/2016 06/15/2016  Falls in the past year? No No No No No     Depression screen [facility] 2/9 08/13/2017 04/03/2017 10/23/2016  Decreased Interest 0 3 1  Down, Depressed, Hopeless 0 3 1  PHQ - 2 Score 0 6 2  Altered sleeping - 3 3  Tired, decreased energy - 3 2  Change in appetite - 0 0  Feeling bad or failure about yourself  - 1 0  Trouble concentrating - 0 0  Moving slowly or fidgety/restless - 0 0  Suicidal thoughts - 0 0  PHQ-9 Score - 13 7  Difficult doing work/chores - Somewhat difficult -    Allergies  Allergen Reactions  . Molds & Smuts Swelling    Prior to Admission medications   Medication Sig Start Date End Date Taking? Authorizing Provider  buPROPion (WELLBUTRIN SR) 150 MG 12 hr tablet Take 1 tablet (150 mg total)  by mouth 2 (two) times daily. 05/13/17  Yes English, [name] D, PA  clonazePAM (KLONOPIN) 0.5 MG tablet TAKE 1 TABLET BY MOUTH ONCE DAILY FOR ANXIETY 06/07/17  Yes English, Stephanie D, PA  ipratropium (ATROVENT) 0.03 % nasal spray Place 2 sprays into both nostrils 2 (two) times daily. 04/03/17  Yes Ivar Drape D, PA  SUMAtriptan (IMITREX) 100 MG tablet take 1 tablet by mouth immediately may repeat after 2 hours if needed for MIGRAINE 07/05/17  Yes Rutherford Guys, MD    Past Medical History:  Diagnosis Date  . ACL graft tear (HCC)    left knee  . Allergy   . Anxiety   . Complication of anesthesia    woke up during surgery  . Depression   . Family history of adverse reaction to anesthesia     mom is difficult to put to sleep  . IUD (intrauterine device) in place   . Knee pain, right   . Migraine headache   . Sleep paralysis     Past Surgical History:  Procedure Laterality Date  . ANTERIOR CRUCIATE LIGAMENT REPAIR Left 08/10/2014   Procedure: ANTERIOR CRUCIATE LIGAMENT (ACL) REPAIR;  Surgeon: Sydnee Cabal, MD;  Location: [facility];  Service: Orthopedics;  Laterality: Left;  . KNEE ARTHROSCOPY Left 08/10/2014   Procedure: ARTHROSCOPY KNEE DEBRIDEMENT ALLOGRAFT ACL REVISION RECONSTRUCTION;  Surgeon: Sydnee Cabal, MD;  Location: [facility];  Service: Orthopedics;  Laterality: Left;  . KNEE ARTHROSCOPY W/ ACL RECONSTRUCTION      Social History   Tobacco Use  . Smoking status: Never Smoker  . Smokeless tobacco: Never Used  Substance Use Topics  . Alcohol use: Yes    Alcohol/week: 2.4 oz    Types: 4 Glasses of wine per week    Family History  Problem Relation Age of Onset  . Goiter Father   . Hyperlipidemia Father   . Hypertension Maternal Grandmother   . Stroke Maternal Grandfather   . Cancer Maternal Grandfather   . Stroke Paternal Grandmother     Review of Systems  Constitutional: Negative for chills and fever.  HENT: Positive  for congestion and sinus pain.   Eyes: Positive for photophobia.  Respiratory: Negative for cough and shortness of breath.   Gastrointestinal: Positive for nausea. Negative for vomiting.  Neurological: Positive for headaches. Negative for sensory change, speech change and focal weakness.   Per hpi  OBJECTIVE:  Blood pressure 102/70, pulse 91, temperature 98.7 F (37.1 C), temperature source Oral, height 5' 4.57" (1.64 m), weight 137 lb (62.1 kg), SpO2 99 %.  Physical Exam  Constitutional: She is oriented to person, place, and time. She appears well-developed and well-nourished.  HENT:  Head: Normocephalic and atraumatic.  Right Ear: Hearing, tympanic membrane, external ear and ear canal normal.  Left Ear: Hearing, tympanic membrane, external ear and ear canal normal.  Mouth/Throat: Oropharynx is clear and moist and mucous membranes are normal.  Nares pale and boggy, clear discharge noted  Eyes: Pupils are equal, round, and reactive to light. EOM are normal. No scleral icterus.  Neck: Neck supple.  Cardiovascular: Normal rate, regular rhythm and normal heart sounds. Exam reveals no gallop and no friction rub.  No murmur heard. Pulmonary/Chest: Effort normal and breath sounds normal. She has no wheezes. She has no rales.  Lymphadenopathy:    She has no cervical adenopathy.  Neurological: She is alert and oriented to person, place, and time.  Skin: Skin is warm and dry.  Psychiatric: She has a normal mood and affect.  Nursing note and vitals reviewed.   ASSESSMENT and PLAN  1. GAD (generalized anxiety disorder) Overall controlled. Continue current regime. - buPROPion (WELLBUTRIN SR) 150 MG 12 hr tablet; Take 1 tablet (150 mg total) by mouth 2 (two) times daily. - clonazePAM (KLONOPIN) 0.5 MG tablet; Take 1 tablet (0.5 mg total) by mouth daily as needed for anxiety.  2. Migraine with aura and without status migrainosus, not intractable Discussed treatment options. Discussed new  med r/se/b. Continue with imitrex for abortive procedures.  - amitriptyline (ELAVIL) 25 MG tablet; Take 1-2 tablets (25-50 mg total) by mouth at bedtime.   3. Sinus pressure Discussed supportive measures, uses a nasal saline washes. New med r/se/b reviewed.  - fluticasone (FLONASE) 50 MCG/ACT nasal spray; Place 1 spray into both nostrils 2 (two) times daily.   Return in about 1 month (around 09/10/2017).    Rutherford Guys, MD Primary Care at [facility] [place], [place] [address] Ph.  [phone] Fax [phone]

## 2017-09-10 ENCOUNTER — Ambulatory Visit: Payer: BLUE CROSS/BLUE SHIELD | Admitting: Family Medicine

## 2017-09-26 ENCOUNTER — Ambulatory Visit: Payer: Self-pay | Admitting: Family Medicine

## 2017-09-27 ENCOUNTER — Telehealth: Payer: Self-pay | Admitting: Family Medicine

## 2017-09-30 ENCOUNTER — Ambulatory Visit: Payer: Self-pay | Admitting: Family Medicine

## 2017-10-01 ENCOUNTER — Encounter: Payer: Self-pay | Admitting: Family Medicine

## 2017-10-01 ENCOUNTER — Ambulatory Visit (INDEPENDENT_AMBULATORY_CARE_PROVIDER_SITE_OTHER): Payer: BLUE CROSS/BLUE SHIELD

## 2017-10-01 ENCOUNTER — Other Ambulatory Visit: Payer: Self-pay

## 2017-10-01 ENCOUNTER — Ambulatory Visit: Payer: BLUE CROSS/BLUE SHIELD | Admitting: Family Medicine

## 2017-10-01 VITALS — BP 122/84 | HR 74 | Temp 98.9°F | Ht 63.39 in | Wt 135.0 lb

## 2017-10-01 DIAGNOSIS — Z9109 Other allergy status, other than to drugs and biological substances: Secondary | ICD-10-CM | POA: Diagnosis not present

## 2017-10-01 DIAGNOSIS — R202 Paresthesia of skin: Secondary | ICD-10-CM

## 2017-10-01 DIAGNOSIS — G43109 Migraine with aura, not intractable, without status migrainosus: Secondary | ICD-10-CM | POA: Diagnosis not present

## 2017-10-01 DIAGNOSIS — N870 Mild cervical dysplasia: Secondary | ICD-10-CM | POA: Insufficient documentation

## 2017-10-01 DIAGNOSIS — M542 Cervicalgia: Secondary | ICD-10-CM | POA: Diagnosis not present

## 2017-10-01 MED ORDER — CYCLOBENZAPRINE HCL 10 MG PO TABS: 10.0000 mg | ORAL_TABLET | Freq: Every day | ORAL | 0 refills | Status: DC

## 2017-10-01 MED ORDER — CETIRIZINE HCL 10 MG PO TABS: 10.0000 mg | ORAL_TABLET | Freq: Every day | ORAL | 11 refills | Status: DC

## 2017-10-01 MED ORDER — SUMATRIPTAN SUCCINATE 100 MG PO TABS: ORAL_TABLET | ORAL | 3 refills | Status: DC

## 2017-10-01 MED ORDER — TRIAMCINOLONE ACETONIDE 55 MCG/ACT NA AERO: 2.0000 | INHALATION_SPRAY | Freq: Every day | NASAL | 12 refills | Status: DC

## 2017-10-01 MED ORDER — TOPIRAMATE 25 MG PO CPSP: 25.0000 mg | ORAL_CAPSULE | Freq: Every day | ORAL | 1 refills | Status: DC

## 2017-10-01 NOTE — Patient Instructions (Signed)
     IF you received an x-ray today, you will receive an invoice from [facility]. Please contact [facility] at [phone] with questions or concerns regarding your invoice.   IF you received labwork today, you will receive an invoice from [facility]. Please contact [facility] at [phone] with questions or concerns regarding your invoice.   Our billing staff will not be able to assist you with questions regarding bills from these companies.  You will be contacted with the lab results as soon as they are available. The fastest way to get your results is to activate your My Chart account. Instructions are located on the last page of this paperwork. If you have not heard from us regarding the results in 2 weeks, please contact this office.     

## 2017-10-01 NOTE — Progress Notes (Signed)
8/6/20195:20 PM  [name] 1985/03/19, 32 y.o. female [id number]  Chief Complaint  Patient presents with  . Anxiety    Follow up    HPI:   Patient is a 32 y.o. female with past medical history significant for GAD, migrains and recent allergies in response to mold expsoure who presents today for followup.  1. Sinus/allergy issues - not well controlled. Problems started after mold exposure in new home. They have aggressively treated/removed mold. She continues to have constant nasal congestion and feeling of swelling of posterior oropharynx. She states that when she looking in the back of her throat she never sees it actually swollen nor red. Has tried azelastine and flonase. She states that has not helped. She denies any swelling of her lips, SOB or problems swallowing. She has not noticed any pain or growth of her neck.   2. Neck pain - chronic. Always at same level but shifts from right to left. No trauma. States that she is just like her father and her uncle and they both had similar neck issues and needed surgery. She cant afford deep tissue massage and chiropractors almost weekly. She prefers to not take medications. She is stressed and carries her shoulders high. She has never had imagining nor done pt. Sometime she gets shooting pain down her arm into her fingers. She denies any weakness. Requesting referral to ortho  3. Migraines - requesting refill of imitrex, recently having migraines almost every other day. Does respond to imitrex. Not taking amitryptyline - made her too tired the next day, very difficult to wake up  Fall Risk  10/01/2017 08/13/2017 04/03/2017 10/23/2016 07/17/2016  Falls in the past year? No No No No No     Depression screen [facility] 2/9 10/01/2017 08/13/2017 04/03/2017  Decreased Interest 0 0 3  Down, Depressed, Hopeless 0 0 3  PHQ - 2 Score 0 0 6  Altered sleeping - - 3  Tired, decreased energy - - 3  Change in appetite - - 0  Feeling bad or failure about yourself   - - 1  Trouble concentrating - - 0  Moving slowly or fidgety/restless - - 0  Suicidal thoughts - - 0  PHQ-9 Score - - 13  Difficult doing work/chores - - Somewhat difficult    Allergies  Allergen Reactions  . Molds & Smuts Swelling    Prior to Admission medications   Medication Sig Start Date End Date Taking? Authorizing Provider  buPROPion (WELLBUTRIN SR) 150 MG 12 hr tablet Take 1 tablet (150 mg total) by mouth 2 (two) times daily. 08/13/17  Yes Rutherford Guys, MD  clonazePAM (KLONOPIN) 0.5 MG tablet Take 1 tablet (0.5 mg total) by mouth daily as needed for anxiety. 08/13/17  Yes Rutherford Guys, MD  fluticasone (FLONASE) 50 MCG/ACT nasal spray Place 1 spray into both nostrils 2 (two) times daily. 08/13/17  Yes Rutherford Guys, MD  SUMAtriptan (IMITREX) 100 MG tablet take 1 tablet by mouth immediately may repeat after 2 hours if needed for MIGRAINE 08/13/17  Yes Rutherford Guys, MD    Past Medical History:  Diagnosis Date  . ACL graft tear (HCC)    left knee  . Allergy   . Anxiety   . Complication of anesthesia    woke up during surgery  . Depression   . Family history of adverse reaction to anesthesia     mom is difficult to put to sleep  . IUD (intrauterine device) in place   .  Knee pain, right   . Migraine headache   . Sleep paralysis     Past Surgical History:  Procedure Laterality Date  . ANTERIOR CRUCIATE LIGAMENT REPAIR Left 08/10/2014   Procedure: ANTERIOR CRUCIATE LIGAMENT (ACL) REPAIR;  Surgeon: Sydnee Cabal, MD;  Location: [facility];  Service: Orthopedics;  Laterality: Left;  . KNEE ARTHROSCOPY Left 08/10/2014   Procedure: ARTHROSCOPY KNEE DEBRIDEMENT ALLOGRAFT ACL REVISION RECONSTRUCTION;  Surgeon: Sydnee Cabal, MD;  [facility];  Service: Orthopedics;  Laterality: Left;  . KNEE ARTHROSCOPY W/ ACL RECONSTRUCTION      Social History   Tobacco Use  . Smoking status: Never Smoker  . Smokeless tobacco: Never  Used  Substance Use Topics  . Alcohol use: Yes    Alcohol/week: 2.4 oz    Types: 4 Glasses of wine per week    Family History  Problem Relation Age of Onset  . Goiter Father   . Hyperlipidemia Father   . Hypertension Maternal Grandmother   . Stroke Maternal Grandfather   . Cancer Maternal Grandfather   . Stroke Paternal Grandmother     ROS Per hpi  OBJECTIVE:  Blood pressure 122/84, pulse 74, temperature 98.9 F (37.2 C), temperature source Oral, height 5' 3.39" (1.61 m), weight 135 lb (61.2 kg), SpO2 98 %. Body mass index is 23.62 kg/m.   Physical Exam  Constitutional: She is oriented to person, place, and time. She appears well-developed and well-nourished.  HENT:  Head: Normocephalic and atraumatic.  Right Ear: Hearing, tympanic membrane, external ear and ear canal normal.  Left Ear: Hearing, tympanic membrane, external ear and ear canal normal.  Mouth/Throat: Oropharynx is clear and moist.  Eyes: Pupils are equal, round, and reactive to light. EOM are normal.  Neck: Normal range of motion. Neck supple. Muscular tenderness present. No spinous process tenderness present.  Cardiovascular: Normal rate, regular rhythm and normal heart sounds. Exam reveals no gallop and no friction rub.  No murmur heard. Pulmonary/Chest: Effort normal and breath sounds normal. She has no wheezes. She has no rales.  Lymphadenopathy:    She has no cervical adenopathy.  Neurological: She is alert and oriented to person, place, and time. She has normal strength and normal reflexes.  Skin: Skin is warm and dry.  Nursing note and vitals reviewed.    Dg Cervical Spine Complete  Result Date: 10/01/2017 CLINICAL DATA:  Neck pain with paresthesias. EXAM: CERVICAL SPINE - COMPLETE 4+ VIEW COMPARISON:  None. FINDINGS: Reversal of cervical lordosis that is usually positional. Borderline C5-6 disc narrowing. No noted facet spurring. No bony foraminal stenosis. The prevertebral soft tissues are  negative. Mild upper thoracic levocurvature. IMPRESSION: 1. No acute finding or bony foraminal stenosis. 2. Thoracic levocurvature. Electronically Signed   By: Monte Fantasia M.D.   On: 10/01/2017 18:49     ASSESSMENT and PLAN  1. Cervical pain (neck) 2. Paresthesias Not controlled. Xray negative. Referring to PT and ortho as requested. Trial of flexeril at bedtime given evidence of spasms today in clinic. New med r/se/b reviewed. - cyclobenzaprine (FLEXERIL) 10 MG tablet; Take 1 tablet (10 mg total) by mouth at bedtime. - Ambulatory referral to Allergy - Ambulatory referral to Physical Therapy - Ambulatory referral to Orthopedic Surgery - DG Cervical Spine Complete; Future  3. Environmental allergies Not controlled. Adding oral antihistamine and changing nasal steroid. Referring to allergist as patient believes all mold has been removed and therefore their should be no more triggers.  - cetirizine (ZYRTEC) 10  MG tablet; Take 1 tablet (10 mg total) by mouth daily. - triamcinolone (NASACORT) 55 MCG/ACT AERO nasal inhaler; Place 2 sprays into the nose daily. - Ambulatory referral to Allergy  4. Migraine with aura and without status migrainosus, not intractable Not controlled. Most likely being made worse by uncontroled neck pain and allergies. Adding topamax for prophylaxis. New med r/se/b reviewed. Refilled imitrex, consider referral to specialist.  - topiramate (TOPAMAX) 25 MG capsule; Take 1 capsule (25 mg total) by mouth at bedtime. - SUMAtriptan (IMITREX) 100 MG tablet; take 1 tablet by mouth immediately may repeat after 2 hours if needed for MIGRAINE  Return in about 6 weeks (around 11/12/2017).    Rutherford Guys, MD Primary Care at [facility] [place], [place] [address] Ph.  [phone] Fax [phone]

## 2017-10-06 ENCOUNTER — Encounter: Payer: Self-pay | Admitting: Family Medicine

## 2017-10-09 ENCOUNTER — Ambulatory Visit (INDEPENDENT_AMBULATORY_CARE_PROVIDER_SITE_OTHER): Payer: BLUE CROSS/BLUE SHIELD | Admitting: Orthopedic Surgery

## 2017-10-09 ENCOUNTER — Encounter (INDEPENDENT_AMBULATORY_CARE_PROVIDER_SITE_OTHER): Payer: Self-pay | Admitting: Orthopedic Surgery

## 2017-10-09 ENCOUNTER — Ambulatory Visit (INDEPENDENT_AMBULATORY_CARE_PROVIDER_SITE_OTHER): Payer: Self-pay

## 2017-10-09 VITALS — BP 106/76 | HR 71 | Resp 12 | Ht 63.0 in | Wt 135.0 lb

## 2017-10-09 DIAGNOSIS — G8929 Other chronic pain: Secondary | ICD-10-CM

## 2017-10-09 DIAGNOSIS — M545 Low back pain: Secondary | ICD-10-CM | POA: Diagnosis not present

## 2017-10-09 DIAGNOSIS — R1031 Right lower quadrant pain: Secondary | ICD-10-CM | POA: Diagnosis not present

## 2017-10-09 DIAGNOSIS — M533 Sacrococcygeal disorders, not elsewhere classified: Secondary | ICD-10-CM

## 2017-10-09 DIAGNOSIS — M542 Cervicalgia: Secondary | ICD-10-CM

## 2017-10-09 NOTE — Progress Notes (Signed)
Office Visit Note   Patient: [name]           Date of Birth: October 16, 1985           MRN: [id number] Visit Date: 10/09/2017              Requested by: Rutherford Guys, MD [address] [place], [place] [address] PCP: Rutherford Guys, MD   Assessment & Plan: Visit Diagnoses:  1. Chronic right-sided low back pain, with sciatica presence unspecified   2. Chronic right SI joint pain   3. Right groin pain   4. Spine pain, cervical   5. Cervicalgia     Plan:  #1: MRI arthrogram of the right hip to rule out labral tear.  Also coronal T2 fat sat of both SI joints to evaluate the right SI joint for posttraumatic osteoarthritis #2: MRI cervical spine to rule out HNP  Follow-Up Instructions: Return in about 2 weeks (around 10/23/2017).   Orders:  Orders Placed This Encounter  Procedures  . XR Lumbar Spine 2-3 Views  . XR HIP UNILAT W OR W/O PELVIS 2-3 VIEWS RIGHT  . Arthrogram  . MR Hip Right w/o contrast  . MR Cervical Spine w/o contrast   No orders of the defined types were placed in this encounter.     Procedures: No procedures performed   Clinical Data: No additional findings.   Subjective: Chief Complaint  Patient presents with  . Neck - Pain  . Right Hip - Pain  . Neck Pain    neck pain has progressivly become worse 1 year, neck pain 6 years. no injury to neck. pain switches from left to right side., mid neck pain to shoulder blades, with tingleing and numbness, extreme pain causes migraines. family history same problems followed by surgery. has recent x-rays in epic system.  . Hip Pain    right hip pain, right side low back pain with pain radiating to right groin. was dropped on concrete at 32 years old, continues to get worse.     HPI  [name] is a 32 year old white female who presents with 2 problems.  In regards to her cervical spine, she had been seen by her primary care doctor with a history of chronic right and left cervical spine pain with some  radicular type symptoms into the arms.  Apparently her father and her uncle had similar symptoms and had to have appropriate fusions.  She has had deep tissue massage which has been helpful at times.  Today she gives a history of about 6 to 7 years ago waking with pain in the right.. She apparently was moving about when she picked up a towel and started having a significant pain.  She has had pain which will radicular in the C 7 and C8 distribution.  She does also have medial scapular pain.  Planes of tingling down both arms also in the same radicular distribution.  She also has migraine secondary to this where she will actually have pain over the posterior aspect of the occiput and to her.  Flexeril is no help and Imitrex is what she uses for her migraines.  She has no relief with pain meds.  Her father and her uncle has been noted to have similar problems with her neck which required cervical fusions.  She has tried massage which can be helpful for her.  In regards to her right hip and low back pain she relates that when she was 32 years of  age she was the flyer on the cheerleading team and apparently had been dropped fallen onto her buttocks at that time.  She complains of pain in the lumbar spine but more into her right SI area deep in her buttocks pain in her groin to her inner area.  She has difficulty sleeping because of this also.   Review of Systems  Constitutional: Positive for activity change.  HENT: Negative for congestion.   Eyes: Negative for visual disturbance.  Respiratory: Positive for shortness of breath.   Cardiovascular: Negative for leg swelling.  Gastrointestinal: Negative for abdominal pain.  Endocrine: Positive for heat intolerance.  Genitourinary: Positive for pelvic pain.  Musculoskeletal: Negative for back pain and neck pain.  Skin: Negative for rash.  Allergic/Immunologic: Positive for environmental allergies.  Neurological: Negative for headaches.  Hematological:  Bruises/bleeds easily.  Psychiatric/Behavioral: Positive for sleep disturbance.     Objective: Vital Signs: BP 106/76 (BP Location: Left Arm, Patient Position: Sitting, Cuff Size: Normal)   Pulse 71   Resp 12   Physical Exam  Constitutional: She is oriented to person, place, and time. She appears well-developed and well-nourished.  HENT:  Mouth/Throat: Oropharynx is clear and moist.  Eyes: Pupils are equal, round, and reactive to light. EOM are normal.  Pulmonary/Chest: Effort normal.  Neurological: She is alert and oriented to person, place, and time.  Skin: Skin is warm and dry.  Psychiatric: She has a normal mood and affect. Her behavior is normal.    Ortho Exam  Cervical spine today reveals good range of motion.  I do not feel any crepitance.  It does cause her pain when she does a right and left.  Little bit of tenderness more around the see the 6-7 area.  She has excellent strength in the upper extremities.  Deep tendon reflexes are 1+ bilateral symmetric.  Lumbar spine does reveal tenderness more over the SI joint on the right to palpation.  She has really to forward flex to about to 5 inches off the floor.  Backward extension causes her pain and discomfort in the lower lumbar spine.  Her deep tendon reflexes were trace in the knees bilateral and symmetric.  Achilles deep tendon reflexes were 2+ bilateral and symmetric.  Negative straight leg raising.  She had good motion of both hips.  Sensation is intact to light touch.  Specialty Comments:  No specialty comments available.  Imaging: X-ray of the lumbar spine reveals some joint space narrowing at L5S1.  Some facet changes at that area also.  There appears to be sclerosing and degenerative changes in the right SI joint comparison the left.  There is inferior spurring of the SI joint on the right.  Incidental noted of ossicle at the superior lateral portion of the dome of the acetabulum.  She appears to have bilateral acetabular  insufficiency.  X-ray of the pelvis reveals right SI joint irregularity with the inferior spurring noted.  Different than the left.  There is an ossicle at the superior lateral aspect of the dome of the right hip.  Appears to have acetabular insufficiency bilaterally.   X-rays reviewed from PACS system of the cervical spine reveals C5-6 disc space narrowing.  There appears to be retrolisthesis of C5 on C6.  T1 vertebral body appears to have some sclerotic changes in the body itself.  May be some spurring posteriorly superiorly.    PMFS History: Current Outpatient Medications  Medication Sig Dispense Refill  . buPROPion (WELLBUTRIN SR) 150 MG 12 hr  tablet Take 1 tablet (150 mg total) by mouth 2 (two) times daily. 180 tablet 1  . cetirizine (ZYRTEC) 10 MG tablet Take 1 tablet (10 mg total) by mouth daily. 30 tablet 11  . clonazePAM (KLONOPIN) 0.5 MG tablet Take 1 tablet (0.5 mg total) by mouth daily as needed for anxiety. 30 tablet 0  . cyclobenzaprine (FLEXERIL) 10 MG tablet Take 1 tablet (10 mg total) by mouth at bedtime. 30 tablet 0  . SUMAtriptan (IMITREX) 100 MG tablet take 1 tablet by mouth immediately may repeat after 2 hours if needed for MIGRAINE 25 tablet 3  . topiramate (TOPAMAX) 25 MG capsule Take 1 capsule (25 mg total) by mouth at bedtime. 30 capsule 1  . triamcinolone (NASACORT) 55 MCG/ACT AERO nasal inhaler Place 2 sprays into the nose daily. 1 Inhaler 12   No current facility-administered medications for this visit.     Patient Active Problem List   Diagnosis Date Noted  . Cervical intraepithelial neoplasia grade 1 10/01/2017  . GAD (generalized anxiety disorder) 09/17/2014  . ACL graft tear ([facility]) 08/10/2014  . S/P ACL reconstruction 08/10/2014  . Migraine headache 05/15/2010   Past Medical History:  Diagnosis Date  . ACL graft tear ([facility])    left knee  . Allergy   . Anxiety   . Complication of anesthesia    woke up during surgery  . Depression   . Family history  of adverse reaction to anesthesia     mom is difficult to put to sleep  . IUD (intrauterine device) in place   . Knee pain, right   . Migraine headache   . Sleep paralysis     Family History  Problem Relation Age of Onset  . Goiter Father   . Hyperlipidemia Father   . Hypertension Maternal Grandmother   . Stroke Maternal Grandfather   . Cancer Maternal Grandfather   . Stroke Paternal Grandmother     Past Surgical History:  Procedure Laterality Date  . ANTERIOR CRUCIATE LIGAMENT REPAIR Left 08/10/2014   Procedure: ANTERIOR CRUCIATE LIGAMENT (ACL) REPAIR;  Surgeon: Sydnee Cabal, MD;  Location: [facility];  Service: Orthopedics;  Laterality: Left;  . KNEE ARTHROSCOPY Left 08/10/2014   Procedure: ARTHROSCOPY KNEE DEBRIDEMENT ALLOGRAFT ACL REVISION RECONSTRUCTION;  Surgeon: Sydnee Cabal, MD;  Location: [facility];  Service: Orthopedics;  Laterality: Left;  . KNEE ARTHROSCOPY W/ ACL RECONSTRUCTION     Social History   Occupational History  . Not on file  Tobacco Use  . Smoking status: Never Smoker  . Smokeless tobacco: Never Used  Substance and Sexual Activity  . Alcohol use: Yes    Alcohol/week: 4.0 standard drinks    Types: 4 Glasses of wine per week  . Drug use: No  . Sexual activity: Yes

## 2017-10-14 ENCOUNTER — Telehealth: Payer: Self-pay | Admitting: Family Medicine

## 2017-10-14 DIAGNOSIS — G43109 Migraine with aura, not intractable, without status migrainosus: Secondary | ICD-10-CM

## 2017-10-14 NOTE — Telephone Encounter (Signed)
Copied from [place] [id number]. Topic: Quick Communication - See Telephone Encounter >> Oct 14, 2017  2:28 PM [name], NT wrote: CRM for notification. See Telephone encounter for: 10/14/17. Pt states that the pharmacy only filled the SUMAtriptan (IMITREX) 100 MG tablet for 25 tablets every 3 months instead of every month. She is wanting to see if this can be fixed.

## 2017-10-15 NOTE — Telephone Encounter (Signed)
Is this how you wanted the prescription written?

## 2017-10-16 NOTE — Telephone Encounter (Signed)
Can you please call pharmacy and see if indeed insurance would cover 25 tabs every 30 days. It might be that it is not covered. Good rx should work if not covered. Please update patient. thanks

## 2017-10-17 NOTE — Telephone Encounter (Signed)
Spoke with pharmacist she is going to try to run it for 25 every 30 days

## 2017-10-22 ENCOUNTER — Ambulatory Visit (INDEPENDENT_AMBULATORY_CARE_PROVIDER_SITE_OTHER): Payer: BLUE CROSS/BLUE SHIELD | Admitting: Orthopaedic Surgery

## 2017-10-23 ENCOUNTER — Other Ambulatory Visit: Payer: Self-pay

## 2017-10-23 ENCOUNTER — Ambulatory Visit
Admission: RE | Admit: 2017-10-23 | Discharge: 2017-10-23 | Disposition: A | Discharge status: Home / Self Care | Payer: BLUE CROSS/BLUE SHIELD | Source: Ambulatory Visit | Attending: Orthopedic Surgery | Admitting: Orthopedic Surgery

## 2017-10-23 DIAGNOSIS — R1031 Right lower quadrant pain: Secondary | ICD-10-CM

## 2017-10-23 MED ORDER — IOPAMIDOL (ISOVUE-M 200) INJECTION 41%
12.0000 mL | Freq: Once | INTRAMUSCULAR | Status: AC
  Administered 2017-10-23: 12 mL via INTRA_ARTICULAR

## 2017-10-25 ENCOUNTER — Other Ambulatory Visit (INDEPENDENT_AMBULATORY_CARE_PROVIDER_SITE_OTHER): Payer: Self-pay | Admitting: Radiology

## 2017-10-25 DIAGNOSIS — M542 Cervicalgia: Secondary | ICD-10-CM

## 2017-11-07 ENCOUNTER — Other Ambulatory Visit: Payer: Self-pay | Admitting: Family Medicine

## 2017-11-07 DIAGNOSIS — F411 Generalized anxiety disorder: Secondary | ICD-10-CM

## 2017-11-12 ENCOUNTER — Ambulatory Visit: Payer: BLUE CROSS/BLUE SHIELD | Admitting: Family Medicine

## 2017-11-12 ENCOUNTER — Encounter: Payer: Self-pay | Admitting: Allergy and Immunology

## 2017-11-12 ENCOUNTER — Ambulatory Visit: Payer: BLUE CROSS/BLUE SHIELD | Admitting: Allergy and Immunology

## 2017-11-12 VITALS — BP 108/60 | HR 95 | Resp 16 | Ht 63.5 in | Wt 133.0 lb

## 2017-11-12 DIAGNOSIS — J321 Chronic frontal sinusitis: Secondary | ICD-10-CM | POA: Diagnosis not present

## 2017-11-12 DIAGNOSIS — T7840XD Allergy, unspecified, subsequent encounter: Secondary | ICD-10-CM

## 2017-11-12 DIAGNOSIS — J329 Chronic sinusitis, unspecified: Secondary | ICD-10-CM | POA: Insufficient documentation

## 2017-11-12 DIAGNOSIS — T783XXA Angioneurotic edema, initial encounter: Secondary | ICD-10-CM | POA: Insufficient documentation

## 2017-11-12 DIAGNOSIS — J310 Chronic rhinitis: Secondary | ICD-10-CM

## 2017-11-12 DIAGNOSIS — T783XXD Angioneurotic edema, subsequent encounter: Secondary | ICD-10-CM | POA: Diagnosis not present

## 2017-11-12 DIAGNOSIS — T7840XA Allergy, unspecified, initial encounter: Secondary | ICD-10-CM | POA: Insufficient documentation

## 2017-11-12 MED ORDER — AUVI-Q 0.3 MG/0.3ML IJ SOAJ: 0.3000 mg | Freq: Once | INTRAMUSCULAR | 1 refills | Status: AC

## 2017-11-12 MED ORDER — AUVI-Q 0.3 MG/0.3ML IJ SOAJ: 0.3000 mg | Freq: Once | INTRAMUSCULAR | 1 refills | Status: DC

## 2017-11-12 MED ORDER — AZELASTINE HCL 0.1 % NA SOLN: 2.0000 | Freq: Two times a day (BID) | NASAL | 5 refills | Status: DC

## 2017-11-12 NOTE — Assessment & Plan Note (Addendum)
Unclear etiology.  Food allergen skin testing was negative today despite a positive histamine control.  The patient is not taking an ACE inhibitor. NSAIDs may exacerbate angioedema but in this case are not the underlying etiology as demonstrated by the fact that the patient has experienced angioedema in the absence of NSAIDs. Will order labs to rule out hereditary angioedema, acquired angioedema, urticaria associated angioedema, and other potential etiologies.   The following labs have been ordered:  tryptase, C4, C1 esterase inhibitor (quantitative and functional), C1q, factor XII, as well as serum specific IgE against zone to environmental panel and alpha gal panel.    The patient will be notified with further recommendations after lab results have returned.  Should symptoms recur, a  journal is to be kept recording any foods eaten, beverages consumed, medications taken, activities performed, and environmental conditions within a 6 hour period prior to the onset of symptoms. For any symptoms concerning for anaphylaxis, epinephrine is to be administered and 911 is to be called immediately.  A prescription has been provided for epinephrine 0.3 mg autoinjector (Auvi-Q) 2 pack along with instructions for its proper administration.

## 2017-11-12 NOTE — Progress Notes (Signed)
New Patient Note  RE: [name] MRN: [id number] DOB: 1985/09/18 Date of Office Visit: 11/12/2017  Referring provider: Rutherford Guys, MD Primary care provider: Rutherford Guys, MD  Chief Complaint: Sinus Problem; Nasal Congestion; and Angioedema   History of present illness: [name] is a 32 y.o. female seen today in consultation requested by Grant Fontana, MD.  The patient reports that after having moved into a home with mold/water damage in July 2018 she began to experience nasal congestion, "constant" postnasal drainage, irritated throat, frontal sinus pressure/pain, and more frequent migraine headaches.  These symptoms have persisted despite 3 attempts at remediation of the mold/water damage in the home.  She has attempted to control the symptoms with fexofenadine/pseudoephedrine, cetirizine, and over-the-counter allergy/sinus medications without adequate symptom relief.  She has required antibiotics and/or steroids on 2 occasions over the past year for sinus infections.  She also believes that exposure to the mold has anxiety, depression, and symptoms of bipolar disorder.  She first recognized that she had a problem with mold approximately 5 years ago when she moved to home with mold damage.  At that time, she experienced nasal and sinus symptoms which seem to improve when she moved out of home. Finally, she states that over the past 8 months she has sensed mild swelling of the back of her tongue which can last from 10 minutes to a few hours.  It does not hinder her ability to swallow, speak, or breathe.  She denies concomitant urticaria, cardiopulmonary symptoms, or other GI symptoms.  No specific medication, food, skin care product, detergent, soap, or other environmental triggers have been identified.  However, the sensation of tongue swelling does seem to concur with nasal/sinus congestion approximately 80% of the time.    Assessment and plan: Chronic rhinitis All seasonal  and perennial aeroallergen skin tests are negative despite a positive histamine control.  Intranasal steroids, intranasal antihistamines, and first generation antihistamines are effective for symptoms associated with non-allergic rhinitis, whereas second generation antihistamines such as cetirizine (Zyrtec), loratadine (Claritin) and fexofenadine (Allegra) have been found to be ineffective for this condition.  A prescription has been provided for azelastine nasal spray, one spray per nostril 1-2 times daily as needed. Proper nasal spray technique has been discussed and demonstrated.  Nasal saline lavage (NeilMed) has been recommended as needed and prior to medicated nasal sprays along with instructions for proper administration.  For thick post nasal drainage, add guaifenesin 1200 mg (Mucinex Maximum Strength)  twice daily as needed with adequate hydration as discussed.  Chronic sinusitis  Treatment plan as outlined above for allergic rhinitis.  If this problem persists or progresses despite treatment plan as outlined above, otolaryngology evaluation may be warranted.  Angioedema Unclear etiology.  Food allergen skin testing was negative today despite a positive histamine control.  The patient is not taking an ACE inhibitor. NSAIDs may exacerbate angioedema but in this case are not the underlying etiology as demonstrated by the fact that the patient has experienced angioedema in the absence of NSAIDs. Will order labs to rule out hereditary angioedema, acquired angioedema, urticaria associated angioedema, and other potential etiologies.   The following labs have been ordered:  tryptase, C4, C1 esterase inhibitor (quantitative and functional), C1q, factor XII, as well as serum specific IgE against zone to environmental panel and alpha gal panel.    The patient will be notified with further recommendations after lab results have returned.  Should symptoms recur, a  journal is to be kept  recording  any foods eaten, beverages consumed, medications taken, activities performed, and environmental conditions within a 6 hour period prior to the onset of symptoms. For any symptoms concerning for anaphylaxis, epinephrine is to be administered and 911 is to be called immediately.  A prescription has been provided for epinephrine 0.3 mg autoinjector (Auvi-Q) 2 pack along with instructions for its proper administration.   Meds ordered this encounter  Medications  . azelastine (ASTELIN) 0.1 % nasal spray    Sig: Place 2 sprays into both nostrils 2 (two) times daily.    Dispense:  30 mL    Refill:  5  . AUVI-Q 0.3 MG/0.3ML SOAJ injection    Sig: Inject 0.3 mLs (0.3 mg total) into the muscle once for 1 dose.    Dispense:  1 Device    Refill:  1    Diagnostics: Epicutaneous testing: Negative despite a positive histamine control. Intradermal testing: Negative. Food allergen skin testing: Negative despite a positive histamine control.    Physical examination: Blood pressure 108/60, pulse 95, resp. rate 16, height 5' 3.5" (1.613 m), weight 133 lb (60.3 kg), SpO2 97 %.  General: Alert, interactive, in no acute distress. HEENT: TMs pearly gray, turbinates moderately edematous with thick discharge, post-pharynx moderately erythematous. Neck: Supple without lymphadenopathy. Lungs: Clear to auscultation without wheezing, rhonchi or rales. CV: Normal S1, S2 without murmurs. Abdomen: Nondistended, nontender. Skin: Warm and dry, without lesions or rashes. Extremities:  No clubbing, cyanosis or edema. Neuro:   Grossly intact.  Review of systems:  Review of systems negative except as noted in HPI / PMHx or noted below: Review of Systems  Constitutional: Negative.   HENT: Negative.   Eyes: Negative.   Respiratory: Negative.   Cardiovascular: Negative.   Gastrointestinal: Negative.   Genitourinary: Negative.   Musculoskeletal: Negative.   Skin: Negative.   Neurological: Negative.     Endo/Heme/Allergies: Negative.   Psychiatric/Behavioral: Negative.     Past medical history:  Past Medical History:  Diagnosis Date  . ACL graft tear ([facility])    left knee  . Allergy   . Anxiety   . Complication of anesthesia    woke up during surgery  . Depression   . Family history of adverse reaction to anesthesia     mom is difficult to put to sleep  . IUD (intrauterine device) in place   . Knee pain, right   . Migraine headache   . Sleep paralysis     Past surgical history:  Past Surgical History:  Procedure Laterality Date  . ANTERIOR CRUCIATE LIGAMENT REPAIR Left 08/10/2014   Procedure: ANTERIOR CRUCIATE LIGAMENT (ACL) REPAIR;  Surgeon: Sydnee Cabal, MD;  Location: [facility];  Service: Orthopedics;  Laterality: Left;  . KNEE ARTHROSCOPY Left 08/10/2014   Procedure: ARTHROSCOPY KNEE DEBRIDEMENT ALLOGRAFT ACL REVISION RECONSTRUCTION;  Surgeon: Sydnee Cabal, MD;  Location[facility];  Service: Orthopedics;  Laterality: Left;  . KNEE ARTHROSCOPY W/ ACL RECONSTRUCTION      Family history: Family History  Problem Relation Age of Onset  . Goiter Father   . Hyperlipidemia Father   . Hypertension Maternal Grandmother   . Stroke Maternal Grandfather   . Cancer Maternal Grandfather   . Stroke Paternal Grandmother     Social history: Social History   Socioeconomic History  . Marital status: Single    Spouse name: Not on file  . Number of children: 0  . Years of education: Not on file  . Highest education  level: Not on file  Occupational History  . Not on file  Social Needs  . Financial resource strain: Not on file  . Food insecurity:    Worry: Not on file    Inability: Not on file  . Transportation needs:    Medical: Not on file    Non-medical: Not on file  Tobacco Use  . Smoking status: Never Smoker  . Smokeless tobacco: Never Used  Substance and Sexual Activity  . Alcohol use: Yes    Alcohol/week: 4.0 standard drinks     Types: 4 Glasses of wine per week  . Drug use: No  . Sexual activity: Yes  Lifestyle  . Physical activity:    Days per week: Not on file    Minutes per session: Not on file  . Stress: Not on file  Relationships  . Social connections:    Talks on phone: Not on file    Gets together: Not on file    Attends religious service: Not on file    Active member of club or organization: Not on file    Attends meetings of clubs or organizations: Not on file    Relationship status: Not on file  . Intimate partner violence:    Fear of current or ex partner: Not on file    Emotionally abused: Not on file    Physically abused: Not on file    Forced sexual activity: Not on file  Other Topics Concern  . Not on file  Social History Narrative  . Not on file   Environmental History: The patient lives in a 33 year old house with hardwood floors throughout, gas heat, and central air.  The home has had remediation x3 since April 2019 for mold/water damage.  She is a non-smoker.  There are 2 cats in the home which have access to her bedroom.  Allergies as of 11/12/2017      Reactions   Molds & Smuts Swelling      Medication List        Accurate as of 11/12/17 12:56 PM. Always use your most recent med list.          AUVI-Q 0.3 mg/0.3 mL Soaj injection Generic drug:  EPINEPHrine Inject 0.3 mLs (0.3 mg total) into the muscle once for 1 dose.   azelastine 0.1 % nasal spray Commonly known as:  ASTELIN Place 2 sprays into both nostrils 2 (two) times daily.   buPROPion 150 MG 12 hr tablet Commonly known as:  WELLBUTRIN SR TAKE 1 TABLET(150 MG) BY MOUTH TWICE DAILY   cetirizine 10 MG tablet Commonly known as:  ZYRTEC Take 1 tablet (10 mg total) by mouth daily.   clonazePAM 0.5 MG tablet Commonly known as:  KLONOPIN Take 1 tablet (0.5 mg total) by mouth daily as needed for anxiety.   cyclobenzaprine 10 MG tablet Commonly known as:  FLEXERIL Take 1 tablet (10 mg total) by mouth at  bedtime.   SUMAtriptan 100 MG tablet Commonly known as:  IMITREX take 1 tablet by mouth immediately may repeat after 2 hours if needed for MIGRAINE   topiramate 25 MG capsule Commonly known as:  TOPAMAX Take 1 capsule (25 mg total) by mouth at bedtime.   triamcinolone 55 MCG/ACT Aero nasal inhaler Commonly known as:  NASACORT Place 2 sprays into the nose daily.       Known medication allergies: Allergies  Allergen Reactions  . Molds & Smuts Swelling    I appreciate the opportunity to take part  in [place] care. Please do not hesitate to contact me with questions.  Sincerely,   R. Edgar Frisk, MD

## 2017-11-12 NOTE — Assessment & Plan Note (Signed)
   Treatment plan as outlined above for allergic rhinitis.  If this problem persists or progresses despite treatment plan as outlined above, otolaryngology evaluation may be warranted.

## 2017-11-12 NOTE — Addendum Note (Signed)
Addended by: Valere Dross on: 11/12/2017 04:13 PM   Modules accepted: Orders

## 2017-11-12 NOTE — Assessment & Plan Note (Signed)
All seasonal and perennial aeroallergen skin tests are negative despite a positive histamine control.  Intranasal steroids, intranasal antihistamines, and first generation antihistamines are effective for symptoms associated with non-allergic rhinitis, whereas second generation antihistamines such as cetirizine (Zyrtec), loratadine (Claritin) and fexofenadine (Allegra) have been found to be ineffective for this condition.  A prescription has been provided for azelastine nasal spray, one spray per nostril 1-2 times daily as needed. Proper nasal spray technique has been discussed and demonstrated.  Nasal saline lavage (NeilMed) has been recommended as needed and prior to medicated nasal sprays along with instructions for proper administration.  For thick post nasal drainage, add guaifenesin 1200 mg (Mucinex Maximum Strength)  twice daily as needed with adequate hydration as discussed.

## 2017-11-12 NOTE — Patient Instructions (Addendum)
Chronic rhinitis All seasonal and perennial aeroallergen skin tests are negative despite a positive histamine control.  Intranasal steroids, intranasal antihistamines, and first generation antihistamines are effective for symptoms associated with non-allergic rhinitis, whereas second generation antihistamines such as cetirizine (Zyrtec), loratadine (Claritin) and fexofenadine (Allegra) have been found to be ineffective for this condition.  A prescription has been provided for azelastine nasal spray, one spray per nostril 1-2 times daily as needed. Proper nasal spray technique has been discussed and demonstrated.  Nasal saline lavage (NeilMed) has been recommended as needed and prior to medicated nasal sprays along with instructions for proper administration.  For thick post nasal drainage, add guaifenesin 1200 mg (Mucinex Maximum Strength)  twice daily as needed with adequate hydration as discussed.  Chronic sinusitis  Treatment plan as outlined above for allergic rhinitis.  If this problem persists or progresses despite treatment plan as outlined above, otolaryngology evaluation may be warranted.  Angioedema Unclear etiology.  Food allergen skin testing was negative today despite a positive histamine control.  The patient is not taking an ACE inhibitor. NSAIDs may exacerbate angioedema but in this case are not the underlying etiology as demonstrated by the fact that the patient has experienced angioedema in the absence of NSAIDs. Will order labs to rule out hereditary angioedema, acquired angioedema, urticaria associated angioedema, and other potential etiologies.   The following labs have been ordered:  tryptase, C4, C1 esterase inhibitor (quantitative and functional), C1q, factor XII, as well as serum specific IgE against zone to environmental panel and alpha gal panel.    The patient will be notified with further recommendations after lab results have returned.  Should symptoms recur, a   journal is to be kept recording any foods eaten, beverages consumed, medications taken, activities performed, and environmental conditions within a 6 hour period prior to the onset of symptoms. For any symptoms concerning for anaphylaxis, epinephrine is to be administered and 911 is to be called immediately.  A prescription has been provided for epinephrine 0.3 mg autoinjector (Auvi-Q) 2 pack along with instructions for its proper administration.   When lab results have returned the patient will be called with further recommendations and follow up instructions.

## 2017-11-12 NOTE — Assessment & Plan Note (Deleted)
Unclear etiology.  Food allergen skin testing was negative today despite a positive histamine control.  The patient is not taking an ACE inhibitor. NSAIDs may exacerbate angioedema but in this case are not the underlying etiology as demonstrated by the fact that the patient has experienced angioedema in the absence of NSAIDs. Will order labs to rule out hereditary angioedema, acquired angioedema, urticaria associated angioedema, and other potential etiologies.   The following labs have been ordered:  tryptase, C4, C1 esterase inhibitor (quantitative and functional), C1q, factor XII, as well as serum specific IgE against zone to environmental panel and alpha gal panel.    The patient will be notified with further recommendations after lab results have returned.  Should symptoms recur, a  journal is to be kept recording any foods eaten, beverages consumed, medications taken, activities performed, and environmental conditions within a 6 hour period prior to the onset of symptoms. For any symptoms concerning for anaphylaxis, 911 is to be called immediately.

## 2017-12-19 NOTE — Telephone Encounter (Signed)
done

## 2017-12-23 ENCOUNTER — Ambulatory Visit: Payer: BLUE CROSS/BLUE SHIELD | Admitting: Family Medicine

## 2017-12-23 ENCOUNTER — Other Ambulatory Visit: Payer: Self-pay

## 2017-12-23 ENCOUNTER — Encounter: Payer: Self-pay | Admitting: Family Medicine

## 2017-12-23 VITALS — BP 125/85 | HR 71 | Temp 98.5°F | Ht 63.5 in | Wt 134.4 lb

## 2017-12-23 DIAGNOSIS — D75839 Thrombocytosis, unspecified: Secondary | ICD-10-CM

## 2017-12-23 DIAGNOSIS — F411 Generalized anxiety disorder: Secondary | ICD-10-CM | POA: Diagnosis not present

## 2017-12-23 DIAGNOSIS — D473 Essential (hemorrhagic) thrombocythemia: Secondary | ICD-10-CM | POA: Diagnosis not present

## 2017-12-23 DIAGNOSIS — J310 Chronic rhinitis: Secondary | ICD-10-CM

## 2017-12-23 DIAGNOSIS — G43109 Migraine with aura, not intractable, without status migrainosus: Secondary | ICD-10-CM | POA: Diagnosis not present

## 2017-12-23 MED ORDER — SUMATRIPTAN SUCCINATE 6 MG/0.5ML ~~LOC~~ SOAJ: 0.5000 mL | Freq: Every day | SUBCUTANEOUS | 3 refills | Status: DC | PRN

## 2017-12-23 MED ORDER — AZELASTINE HCL 0.1 % NA SOLN: 2.0000 | Freq: Two times a day (BID) | NASAL | 5 refills | Status: DC

## 2017-12-23 MED ORDER — TRIAMCINOLONE ACETONIDE 55 MCG/ACT NA AERO: 2.0000 | INHALATION_SPRAY | Freq: Every day | NASAL | 12 refills | Status: DC

## 2017-12-23 MED ORDER — DULOXETINE HCL 60 MG PO CPEP: 60.0000 mg | ORAL_CAPSULE | Freq: Every day | ORAL | 5 refills | Status: DC

## 2017-12-23 MED ORDER — CLONAZEPAM 0.5 MG PO TABS: 0.5000 mg | ORAL_TABLET | Freq: Every day | ORAL | 0 refills | Status: DC | PRN

## 2017-12-23 MED ORDER — SUMATRIPTAN SUCCINATE 100 MG PO TABS: ORAL_TABLET | ORAL | 5 refills | Status: DC

## 2017-12-23 MED ORDER — DULOXETINE HCL 30 MG PO CPEP: 30.0000 mg | ORAL_CAPSULE | Freq: Every day | ORAL | 0 refills | Status: DC

## 2017-12-23 NOTE — Progress Notes (Signed)
10/28/20193:44 PM  [name] [birth year], 32 y.o. female [id number]  Chief Complaint  Patient presents with  . Migraine    reoccuring since the age of 65 yrs. Worst pain ever this past weekend. wanrs information on the imitrex injections  . Medication Refill    Zrytec, Klonopin, Imitrex    HPI:   Patient is a 32 y.o. female with past medical history significant for GAD, migraines who presents today for several concerns  GAD - takes klonopin prn, last rx June, pmp reviewed Wanting to get back on cymbalta 53m - anxiety was doing much better Anhedonia much improved, being much more active and engaged Now feels over stimulated on wellbutrin  Migraines - topomax, flexeril, sumatriptan. Having fewer migraines, but when she gets them they are worse. Having sinus and neck as trigger, unilateral.  topomax 25 making very very drowsy the next day, down to about 15 migraines a day. Overall sumatriptan PO resolves but this last migraine she did not respond. She has a friend that has IM sumitriptan and gave immediate relief. Requesting prescription.  maxalt has not worked in the past  Neck pain - saw ortho. Has had extensive imagining of her hip. Ortho worried about labral tear, has not had any imaging of neck due to finances Pain overall worsening. Takes flexeril prn  rhinitis - saw allergist, angioedema and chronic rhinitis, negative allergy testing, rx azelastine and epi pen. Azelastine twice a day, helping with drainage but not swelling, it was a bit drying. nasacort has been helping with swelling.   She will need referral for ortho spine and ent in January She will let me know which ortho spine she would like to see  Fall Risk  12/23/2017 10/01/2017 08/13/2017 04/03/2017 10/23/2016  Falls in the past year? No No No No No     [facility] 12/23/2017 10/01/2017 08/13/2017  Decreased Interest 0 0 0  Down, Depressed, Hopeless 0 0 0  PHQ - 2 Score 0 0 0  Altered sleeping - - -    Tired, decreased energy - - -  Change in appetite - - -  Feeling bad or failure about yourself  - - -  Trouble concentrating - - -  Moving slowly or fidgety/restless - - -  Suicidal thoughts - - -  PHQ-9 Score - - -  Difficult doing work/chores - - -    Allergies  Allergen Reactions  . Molds & Smuts Swelling    Prior to Admission medications   Medication Sig Start Date End Date Taking? Authorizing Provider  cetirizine (ZYRTEC) 10 MG tablet Take 1 tablet (10 mg total) by mouth daily. 10/01/17  Yes SRutherford Guys MD  clonazePAM (KLONOPIN) 0.5 MG tablet Take 1 tablet (0.5 mg total) by mouth daily as needed for anxiety. 08/13/17  Yes SRutherford Guys MD  cyclobenzaprine (FLEXERIL) 10 MG tablet Take 1 tablet (10 mg total) by mouth at bedtime. 10/01/17  Yes SRutherford Guys MD  SUMAtriptan (IMITREX) 100 MG tablet take 1 tablet by mouth immediately may repeat after 2 hours if needed for MIGRAINE 10/01/17  Yes SRutherford Guys MD  topiramate (TOPAMAX) 25 MG capsule Take 1 capsule (25 mg total) by mouth at bedtime. 10/01/17  Yes SRutherford Guys MD  azelastine (ASTELIN) 0.1 % nasal spray Place 2 sprays into both nostrils 2 (two) times daily. Patient not taking: Reported on 12/23/2017 11/12/17   BAdelina Mings MD  buPROPion ([facility]) 150 MG 12 hr  tablet TAKE 1 TABLET(150 MG) BY MOUTH TWICE DAILY Patient not taking: Reported on 12/23/2017 11/07/17   Rutherford Guys, MD  triamcinolone (NASACORT) 55 MCG/ACT AERO nasal inhaler Place 2 sprays into the nose daily. Patient not taking: Reported on 12/23/2017 10/01/17   Rutherford Guys, MD    Past Medical History:  Diagnosis Date  . ACL graft tear (HCC)    left knee  . Allergy   . Anxiety   . Complication of anesthesia    woke up during surgery  . Depression   . Family history of adverse reaction to anesthesia     mom is difficult to put to sleep  . IUD (intrauterine device) in place   . Knee pain, right   . Migraine headache   .  Sleep paralysis     Past Surgical History:  Procedure Laterality Date  . ANTERIOR CRUCIATE LIGAMENT REPAIR Left 08/10/2014   Procedure: ANTERIOR CRUCIATE LIGAMENT (ACL) REPAIR;  Surgeon: Sydnee Cabal, MD;  Location: [facility];  Service: Orthopedics;  Laterality: Left;  . KNEE ARTHROSCOPY Left 08/10/2014   Procedure: ARTHROSCOPY KNEE DEBRIDEMENT ALLOGRAFT ACL REVISION RECONSTRUCTION;  Surgeon: Sydnee Cabal, MD;  Location: [facility];  Service: Orthopedics;  Laterality: Left;  . KNEE ARTHROSCOPY W/ ACL RECONSTRUCTION      Social History   Tobacco Use  . Smoking status: Never Smoker  . Smokeless tobacco: Never Used  Substance Use Topics  . Alcohol use: Yes    Alcohol/week: 4.0 standard drinks    Types: 4 Glasses of wine per week    Family History  Problem Relation Age of Onset  . Goiter Father   . Hyperlipidemia Father   . Hypertension Maternal Grandmother   . Stroke Maternal Grandfather   . Cancer Maternal Grandfather   . Stroke Paternal Grandmother     ROS Per hpi  OBJECTIVE:  Blood pressure 125/85, pulse 71, temperature 98.5 F (36.9 C), temperature source Oral, height 5' 3.5" (1.613 m), weight 134 lb 6.4 oz (61 kg), SpO2 99 %. Body mass index is 23.43 kg/m.   Physical Exam  Constitutional: She is oriented to person, place, and time. She appears well-developed and well-nourished.  HENT:  Head: Normocephalic and atraumatic.  Mouth/Throat: Mucous membranes are normal.  Eyes: Pupils are equal, round, and reactive to light. Conjunctivae and EOM are normal. No scleral icterus.  Neck: Neck supple.  Pulmonary/Chest: Effort normal.  Neurological: She is alert and oriented to person, place, and time.  Skin: Skin is warm and dry.  Psychiatric: She has a normal mood and affect.  Nursing note and vitals reviewed.    ASSESSMENT and PLAN  1. Migraine with aura and without status migrainosus, not intractable Not well controlled. Unable  to increase topomax due to side effects. Will do rx for injectable imitrex. Will continue working on triggers. Consider referral to headache clinic - SUMAtriptan (IMITREX) 100 MG tablet; take 1 tablet by mouth immediately may repeat after 2 hours if needed for MIGRAINE  2. Chronic rhinitis Not well controlled. Dc zyrtec. Resume azelastine and flonase. Discussed nasal saline for dryness. ENT referral in January - triamcinolone (NASACORT) 55 MCG/ACT AERO nasal inhaler; Place 2 sprays into the nose daily.  3. GAD (generalized anxiety disorder) Not well controlled. Discussed cross taper from wellbutrin to cymbalta. Goal is to get back on 44m daily. Might also help with neck pain.  - clonazePAM (KLONOPIN) 0.5 MG tablet; Take 1 tablet (0.5 mg total) by mouth daily  as needed for anxiety.  4. Thrombocytosis ([place]) - CBC with Differential/Platelet  Patient will get back with [place] for referrals in January 2020 for ENT and spine surgeon   Other orders - SUMAtriptan 6 MG/0.5ML SOAJ; Inject 0.5 mLs into the skin daily as needed. - azelastine (ASTELIN) 0.1 % nasal spray; Place 2 sprays into both nostrils 2 (two) times daily. - DULoxetine (CYMBALTA) 30 MG capsule; Take 1 capsule (30 mg total) by mouth daily. - DULoxetine (CYMBALTA) 60 MG capsule; Take 1 capsule (60 mg total) by mouth daily.   Return in about 4 months (around 04/25/2018).    Rutherford Guys, MD Primary Care at [facility] [place], [place] [address] Ph.  [phone] Fax [phone]

## 2017-12-23 NOTE — Patient Instructions (Signed)
° ° ° °  If you have lab work done today you will be contacted with your lab results within the next 2 weeks.  If you have not heard from us then please contact us. The fastest way to get your results is to register for My Chart. ° ° °IF you received an x-ray today, you will receive an invoice from [facility]. Please contact  Radiology at [phone] with questions or concerns regarding your invoice.  ° °IF you received labwork today, you will receive an invoice from [facility]. Please contact [facility] at [phone] with questions or concerns regarding your invoice.  ° °Our billing staff will not be able to assist you with questions regarding bills from these companies. ° °You will be contacted with the lab results as soon as they are available. The fastest way to get your results is to activate your My Chart account. Instructions are located on the last page of this paperwork. If you have not heard from us regarding the results in 2 weeks, please contact this office. °  ° ° ° °

## 2017-12-24 LAB — CBC WITH DIFFERENTIAL/PLATELET
Basophils Absolute: 0 10*3/uL (ref 0.0–0.2)
Basos: 1 %
EOS (ABSOLUTE): 0.8 10*3/uL — ABNORMAL HIGH (ref 0.0–0.4)
Eos: 11 %
Hematocrit: 40.7 % (ref 34.0–46.6)
Hemoglobin: 13.7 g/dL (ref 11.1–15.9)
Immature Grans (Abs): 0 10*3/uL (ref 0.0–0.1)
Immature Granulocytes: 0 %
Lymphocytes Absolute: 2 10*3/uL (ref 0.7–3.1)
Lymphs: 29 %
MCH: 30.7 pg (ref 26.6–33.0)
MCHC: 33.7 g/dL (ref 31.5–35.7)
MCV: 91 fL (ref 79–97)
Monocytes Absolute: 0.6 10*3/uL (ref 0.1–0.9)
Monocytes: 8 %
Neutrophils Absolute: 3.4 10*3/uL (ref 1.4–7.0)
Neutrophils: 51 %
Platelets: 387 10*3/uL (ref 150–450)
RBC: 4.46 x10E6/uL (ref 3.77–5.28)
RDW: 11.3 % — ABNORMAL LOW (ref 12.3–15.4)
WBC: 6.8 10*3/uL (ref 3.4–10.8)

## 2017-12-25 ENCOUNTER — Telehealth: Payer: Self-pay

## 2017-12-25 NOTE — Telephone Encounter (Signed)
Copied from [place] [id number]. Topic: General - Inquiry >> Dec 25, 2017  4:44 PM Virl Axe D wrote: Reason for CRM: Pt had OV with Dr. Pamella Pert on 10/28. She is still experiencing extreme drainage and sinus pressure. Would like to have a script for sinus relief sent to her pharmacy. Please advise  [facility] [phone] - [place], [place] - [place] [phone] (Phone) [phone] (Fax)  Message sent to Dr. Pamella Pert

## 2017-12-25 NOTE — Telephone Encounter (Signed)
I did send in a prescription for nasacort. Meds take a couple of weeks for full effect. She is to continue with azelastine and nasacort twice a day. thanks

## 2017-12-26 MED ORDER — AMOXICILLIN-POT CLAVULANATE 875-125 MG PO TABS: 1.0000 | ORAL_TABLET | Freq: Two times a day (BID) | ORAL | 0 refills | Status: DC

## 2017-12-26 NOTE — Telephone Encounter (Signed)
Pt called back in to follow up on message. Pt says that she is getting married tomorrow and would like to be advised / have assistance as soon as possible.

## 2017-12-26 NOTE — Telephone Encounter (Signed)
Please let patient know that I sent a prescription for augmentin twice a day x 10 days. She needs to take probiotics while on this as it can give diarrhea. thanks

## 2017-12-26 NOTE — Addendum Note (Signed)
Addended by: Rutherford Guys on: 12/26/2017 04:17 PM   Modules accepted: Orders

## 2017-12-26 NOTE — Telephone Encounter (Signed)
Spoke with pt.  She states she is using both nasal sprays 2xper day She questioned why she wasn;t prescribed an antibiotic.  Attempted to explain, but pt states that is why she called in.  Symptoms seems worse and she feels she has antibiotic.  Offered to make appt.  Pt wanted phone call put in to Dr. Pamella Pert.

## 2018-01-10 ENCOUNTER — Ambulatory Visit: Payer: Self-pay | Admitting: *Deleted

## 2018-01-10 NOTE — Telephone Encounter (Signed)
Summary: Acute - Clinical Advice   Relation to [id number]  Call back number: [phone] Pharmacy: [facility] [place], [place] [phone] (Phone) [phone] (Fax)    Reason for call:  Patient was seen by Rutherford Guys, MD ([place]) 12/23/17 and states PCP advised if symptoms didn't improve to give her a call. Patient currently experiencing sinus cavity pressure and pain, extreme mucus and drainage. Patient states amoxicillin-clavulanate (AUGMENTIN) 875-125 MG tablet did not help and she has been using the nasal spray everyday as directed, please advise     Patient reports she was recently treated for URI- sinus infection. Patient reports she finished antibiotic 5 days ago and she is using her nasal sprays as directed. Her sinus pain, pressure and drainage has returned. She is waiting until January for referral due to insurance and would like to know if she need retreatment or prednisone with retreatment. Reason for Disposition . [1] Finished taking antibiotics AND [2] symptoms are BETTER but [3] not completely gone    Patient has finished antibiotics 5 days ago- her symptoms are back- sinus pressure, pain and drainage. ( drainage reported as "really bad")  Answer Assessment - Initial Assessment Questions 1. INFECTION: "What infection is the antibiotic being given for?"     URI- sinus infection 2. ANTIBIOTIC: "What antibiotic are you taking" "How many times per day?"     Augmentin- patient has finished treatment- patient did improve-but now getting worse 3. DURATION: "When was the antibiotic started?"     10 day treatment 4. MAIN CONCERN OR SYMPTOM:  "What is your main concern right now?"     Pressure and pain with drainage- patient is using 2 nasal sprays as prescribed 5. BETTER-SAME-WORSE: "Are you getting better, staying the same, or getting worse compared to when you first started the antibiotics?" If  getting worse, ask: "In what way?"      Getting worse now- drainage and pressure 6. FEVER: "Do you have a fever?" If so, ask: "What is your temperature, how was it measured, and when did it start?"     No fever 7. SYMPTOMS: "Are there any other symptoms you're concerned about?" If so, ask: "When did it start?"     Pressure, pain, drainage- alot 8. FOLLOW-UP APPOINTMENT: "Do you have a follow-up appointment with your doctor?"     Referral to be done in January due to insurance  Protocols used: INFECTION ON ANTIBIOTIC FOLLOW-UP CALL-A-AH

## 2018-01-16 NOTE — Telephone Encounter (Signed)
Left message for pt to call the office, may need follow up to re-evaluate symptoms.

## 2018-01-17 ENCOUNTER — Ambulatory Visit: Payer: Self-pay

## 2018-01-17 NOTE — Telephone Encounter (Signed)
Pt. Reports she was told by Dr. Pamella Pert that it was possible she could develop a UTI while on Augmentin for her sinus infection. States this week she has developed burning,frequency and foul smelling urine. Requests Dr. Pamella Pert call "something infor me since she knows this likely to happen." Refuses an afternoon appointment. Please advise pt.  Reason for Disposition . Urinating more frequently than usual (i.e., frequency)  Answer Assessment - Initial Assessment Questions 1. SYMPTOM: "What's the main symptom you're concerned about?" (e.g., frequency, incontinence)     Burning, frequency , foul-smelling urine 2. ONSET: "When did the  Burning  start?"     Started this past week 3. PAIN: "Is there any pain?" If so, ask: "How bad is it?" (Scale: 1-10; mild, moderate, severe)     Stomach - 4 4. CAUSE: "What do you think is causing the symptoms?"     UTI 5. OTHER SYMPTOMS: "Do you have any other symptoms?" (e.g., fever, flank pain, blood in urine, pain with urination)     Small amount of blood 6. PREGNANCY: "Is there any chance you are pregnant?" "When was your last menstrual period?"     No  Protocols used: URINARY [facility]

## 2018-01-19 NOTE — Telephone Encounter (Signed)
Pt message sent to Dr. Pamella Pert re: abx prescribed for UTI

## 2018-01-20 NOTE — Telephone Encounter (Signed)
Please clarify with patient..taking antibiotics is sometimes associated with women developing a vaginal yeast infection NOT a UTI. Yeast infection - vaginal discharge, thick white, itchy. Her symptoms are different. She needs to be evaluated in clinic. Thanks

## 2018-01-24 NOTE — Telephone Encounter (Signed)
L/m for pt that abx may increase likelihood for yeast infection.  Her reported s/s are not consistent with yeast infection but do sound like UTI, which would not be related to abx use.  Advised pt to c/b to schedule appt for urinary symptoms.

## 2018-02-17 ENCOUNTER — Other Ambulatory Visit: Payer: Self-pay | Admitting: Family Medicine

## 2018-02-17 DIAGNOSIS — F411 Generalized anxiety disorder: Secondary | ICD-10-CM

## 2018-02-18 NOTE — Telephone Encounter (Signed)
equested medication (s) are due for refill today: yes  Requested medication (s) are on the active medication list: yes  Last refill:  12/23/17  Future visit scheduled: yes  Notes to clinic:  not delegated    Requested Prescriptions  Pending Prescriptions Disp Refills   clonazePAM (KLONOPIN) 0.5 MG tablet [Pharmacy Med Name: CLONAZEPAM 0.5MG  TABLETS] 30 tablet     Sig: TAKE 1 TABLET(0.5 MG) BY MOUTH DAILY AS NEEDED FOR ANXIETY     Not Delegated - Psychiatry:  Anxiolytics/Hypnotics Failed - 02/17/2018  8:19 PM      Failed - This refill cannot be delegated      Failed - Urine Drug Screen completed in last 360 days.      Failed - Valid encounter within last 6 months    Recent Outpatient Visits          1 month ago Thrombocytosis [facility])   [facility], Lilia Argue, MD   4 months ago Cervical pain (neck)   [facility] at [facility], Lilia Argue, MD   6 months ago GAD (generalized anxiety disorder)   [facility] at [facility], Lilia Argue, MD   9 months ago GAD (generalized anxiety disorder)   Primary Care at [place], [place], [place]   10 months ago Annual physical exam   Primary Care at [place], [place], [place]      Future Appointments            In 2 months Pamella Pert, Lilia Argue, MD Primary Care at [place], [facility]

## 2018-02-20 NOTE — Telephone Encounter (Signed)
pmp reviewed Last rx oct 2019 Med refilled

## 2018-04-22 ENCOUNTER — Ambulatory Visit: Payer: BLUE CROSS/BLUE SHIELD | Admitting: Family Medicine

## 2018-04-22 ENCOUNTER — Telehealth: Payer: Self-pay | Admitting: Family Medicine

## 2018-04-22 DIAGNOSIS — G43109 Migraine with aura, not intractable, without status migrainosus: Secondary | ICD-10-CM

## 2018-04-22 DIAGNOSIS — N870 Mild cervical dysplasia: Secondary | ICD-10-CM

## 2018-04-22 NOTE — Telephone Encounter (Signed)
Copied from [place] [id number]. Topic: Referral - Question >> Apr 22, 2018  8:07 AM Rayann Heman wrote: Reason for CRM: pt called and stated that she discussed having a referral sent to an ENT and someone to help with migraines. Also stated that she would need a referral to Ortho. Pt would like a call back regarding. Please advise

## 2018-04-23 ENCOUNTER — Other Ambulatory Visit: Payer: Self-pay | Admitting: Family Medicine

## 2018-04-23 DIAGNOSIS — F411 Generalized anxiety disorder: Secondary | ICD-10-CM

## 2018-04-23 NOTE — Telephone Encounter (Signed)
Requested medication (s) are due for refill today: yes  Requested medication (s) are on the active medication list: yes    Last refill: 02/20/2018  #30  0 refills  Future visit scheduled yes 05/05/2018 Dr. Pamella Pert  Notes to clinic: not delegated  Requested Prescriptions  Pending Prescriptions Disp Refills   clonazePAM (KLONOPIN) 0.5 MG tablet [Pharmacy Med Name: CLONAZEPAM 0.5MG  TABLETS] 30 tablet     Sig: TAKE 1 TABLET(0.5 MG) BY MOUTH DAILY AS NEEDED FOR ANXIETY     Not Delegated - Psychiatry:  Anxiolytics/Hypnotics Failed - 04/23/2018  4:52 PM      Failed - This refill cannot be delegated      Failed - Urine Drug Screen completed in last 360 days.      Failed - Valid encounter within last 6 months    Recent Outpatient Visits          4 months ago Thrombocytosis [facility])   [facility], Lilia Argue, MD   6 months ago Cervical pain (neck)   [facility] at [facility], Lilia Argue, MD   8 months ago GAD (generalized anxiety disorder)   [facility] at [facility], Lilia Argue, MD   11 months ago GAD (generalized anxiety disorder)   Primary Care at [place], [place], [place]   1 year ago Annual physical exam   Primary Care at [place], [place], [place]      Future Appointments            In 1 week Rutherford Guys, MD Primary Care at [place], [facility]

## 2018-04-27 NOTE — Telephone Encounter (Signed)
pmp reviewed Takes clonazepam prn Has OV with me on 05/05/2018

## 2018-04-30 NOTE — Telephone Encounter (Signed)
Put in referrals but called patient and number was out of service.

## 2018-05-05 ENCOUNTER — Other Ambulatory Visit: Payer: Self-pay

## 2018-05-05 ENCOUNTER — Ambulatory Visit (INDEPENDENT_AMBULATORY_CARE_PROVIDER_SITE_OTHER): Payer: 59 | Admitting: Family Medicine

## 2018-05-05 ENCOUNTER — Encounter: Payer: Self-pay | Admitting: Family Medicine

## 2018-05-05 ENCOUNTER — Ambulatory Visit (INDEPENDENT_AMBULATORY_CARE_PROVIDER_SITE_OTHER): Payer: 59

## 2018-05-05 VITALS — BP 125/88 | HR 63 | Temp 97.7°F | Ht 63.5 in | Wt 133.2 lb

## 2018-05-05 DIAGNOSIS — M25572 Pain in left ankle and joints of left foot: Secondary | ICD-10-CM

## 2018-05-05 DIAGNOSIS — S99912A Unspecified injury of left ankle, initial encounter: Secondary | ICD-10-CM | POA: Diagnosis not present

## 2018-05-05 DIAGNOSIS — F418 Other specified anxiety disorders: Secondary | ICD-10-CM

## 2018-05-05 DIAGNOSIS — M25472 Effusion, left ankle: Secondary | ICD-10-CM

## 2018-05-05 DIAGNOSIS — K921 Melena: Secondary | ICD-10-CM | POA: Diagnosis not present

## 2018-05-05 DIAGNOSIS — K644 Residual hemorrhoidal skin tags: Secondary | ICD-10-CM | POA: Diagnosis not present

## 2018-05-05 DIAGNOSIS — M7989 Other specified soft tissue disorders: Secondary | ICD-10-CM | POA: Diagnosis not present

## 2018-05-05 MED ORDER — HYDROCORTISONE ACETATE 25 MG RE SUPP: 25.0000 mg | Freq: Two times a day (BID) | RECTAL | 0 refills | Status: DC

## 2018-05-05 MED ORDER — HYDROCORTISONE 2.5 % RE CREA: 1.0000 "application " | TOPICAL_CREAM | Freq: Two times a day (BID) | RECTAL | 0 refills | Status: DC

## 2018-05-05 NOTE — Progress Notes (Signed)
3/9/20208:50 AM  [name] 09/05/1985, 33 y.o. female [id number]  Chief Complaint  Patient presents with  . Follow-up    did do mri on hip, has ortho appt for follow up as well.   . Fall    left ankle, slipped on water last month, bruise the ankle with popping noise  . Rectal Bleeding    since January     HPI:   Patient is a 33 y.o. female with past medical history significant for GAD, migraines, DDD of neck, chronic Right hip pain, angioedema, chronic rhinitis who presents today for routine follow up  Last OV oct 2019 Started on cymbalta 54m Will be seeing ENT and ortho MRI shows labral tear of right hip  A month ago slipped in water spraining her left ankle Still having sign pain along both lateral and medial aspects H/o recurrent sprains on this ankle Stills swells, icing and wrapping daily Ortho has been notiified  Anxiety has increased past several weeks  She thinks that is all compounded stress from all ortho issues and still having to plan for weeding ceremony Does not want to change meds at this time  Has been having bright red blood in stool for past several months Goes daily but more pebbly, smaller, mucous and bright red blood Denies any pain with BM No frank abd pain, having more stress related feeling in upper abdomen Denies any known hemorrhoids Does not known about fhx of GI issues Appetite ok, no vomiting  Wt Readings from Last 3 Encounters:  05/05/18 133 lb 3.2 oz (60.4 kg)  12/23/17 134 lb 6.4 oz (61 kg)  11/12/17 133 lb (60.3 kg)       GAD 7 : Generalized Anxiety Score 05/05/2018 04/03/2017  Nervous, Anxious, on Edge 3 3  Control/stop worrying 3 2  Worry too much - different things 3 2  Trouble relaxing 3 3  Restless 1 0  Easily annoyed or irritable 2 3  Afraid - awful might happen 0 0  Total GAD 7 Score 15 13  Anxiety Difficulty Very difficult Not difficult at all     Fall Risk  05/05/2018 12/23/2017 10/01/2017 08/13/2017 04/03/2017    Falls in the past year? 1 No No No No  Number falls in past yr: 0 - - - -  Injury with Fall? 0 - - - -     [facility] 05/05/2018 12/23/2017 10/01/2017  Decreased Interest 0 0 0  Down, Depressed, Hopeless 0 0 0  PHQ - 2 Score 0 0 0  Altered sleeping - - -  Tired, decreased energy - - -  Change in appetite - - -  Feeling bad or failure about yourself  - - -  Trouble concentrating - - -  Moving slowly or fidgety/restless - - -  Suicidal thoughts - - -  PHQ-9 Score - - -  Difficult doing work/chores - - -    Allergies  Allergen Reactions  . Molds & Smuts Swelling    Prior to Admission medications   Medication Sig Start Date End Date Taking? Authorizing Provider  azelastine (ASTELIN) 0.1 % nasal spray Place 2 sprays into both nostrils 2 (two) times daily. 12/23/17  Yes SRutherford Guys MD  clonazePAM (KLONOPIN) 0.5 MG tablet TAKE 1 TABLET(0.5 MG) BY MOUTH DAILY AS NEEDED FOR ANXIETY 04/27/18  Yes SRutherford Guys MD  cyclobenzaprine (FLEXERIL) 10 MG tablet Take 1 tablet (10 mg total) by mouth at bedtime. 10/01/17  Yes SPamella Pert  Lilia Argue, MD  DULoxetine (CYMBALTA) 60 MG capsule Take 1 capsule (60 mg total) by mouth daily. 01/23/18  Yes Rutherford Guys, MD  SUMAtriptan (IMITREX) 100 MG tablet take 1 tablet by mouth immediately may repeat after 2 hours if needed for MIGRAINE 12/23/17  Yes Rutherford Guys, MD  SUMAtriptan 6 MG/0.5ML SOAJ Inject 0.5 mLs into the skin daily as needed. 12/23/17  Yes Rutherford Guys, MD    Past Medical History:  Diagnosis Date  . ACL graft tear (HCC)    left knee  . Allergy   . Anxiety   . Complication of anesthesia    woke up during surgery  . Depression   . Family history of adverse reaction to anesthesia     mom is difficult to put to sleep  . IUD (intrauterine device) in place   . Knee pain, right   . Migraine headache   . Sleep paralysis     Past Surgical History:  Procedure Laterality Date  . ANTERIOR CRUCIATE LIGAMENT  REPAIR Left 08/10/2014   Procedure: ANTERIOR CRUCIATE LIGAMENT (ACL) REPAIR;  Surgeon: Sydnee Cabal, MD;  Location: [place];  Service: Orthopedics;  Laterality: Left;  . KNEE ARTHROSCOPY Left 08/10/2014   Procedure: ARTHROSCOPY KNEE DEBRIDEMENT ALLOGRAFT ACL REVISION RECONSTRUCTION;  Surgeon: Sydnee Cabal, MD;  [facility];  Service: Orthopedics;  Laterality: Left;  . KNEE ARTHROSCOPY W/ ACL RECONSTRUCTION      Social History   Tobacco Use  . Smoking status: Never Smoker  . Smokeless tobacco: Never Used  Substance Use Topics  . Alcohol use: Yes    Alcohol/week: 4.0 standard drinks    Types: 4 Glasses of wine per week    Family History  Problem Relation Age of Onset  . Goiter Father   . Hyperlipidemia Father   . Hypertension Maternal Grandmother   . Stroke Maternal Grandfather   . Cancer Maternal Grandfather   . Stroke Paternal Grandmother     ROS Per hpi  OBJECTIVE:  Blood pressure 125/88, pulse 63, temperature 97.7 F (36.5 C), height 5' 3.5" (1.613 m), weight 133 lb 3.2 oz (60.4 kg), SpO2 98 %. Body mass index is 23.23 kg/m.   Physical Exam Vitals signs and nursing note reviewed. Exam conducted with a chaperone present.  Constitutional:      Appearance: She is well-developed.  HENT:     Head: Normocephalic and atraumatic.  Eyes:     General: No scleral icterus.    Conjunctiva/sclera: Conjunctivae normal.     Pupils: Pupils are equal, round, and reactive to light.  Neck:     Musculoskeletal: Neck supple.  Pulmonary:     Effort: Pulmonary effort is normal.  Genitourinary:    Rectum: External hemorrhoid present. No anal fissure or internal hemorrhoid. Normal anal tone.  Musculoskeletal:     Left ankle: She exhibits swelling. She exhibits normal range of motion and normal pulse. Tenderness.  Skin:    General: Skin is warm and dry.  Neurological:     Mental Status: She is alert and oriented to person, place, and  time.     Dg Ankle Complete Left  Result Date: 05/05/2018 CLINICAL DATA:  Fall and water a month ago with persistent swelling EXAM: LEFT ANKLE COMPLETE - 3+ VIEW COMPARISON:  None. FINDINGS: There is no evidence of fracture, dislocation, or joint effusion. There is no evidence of arthropathy or other focal bone abnormality. Soft tissues are unremarkable. IMPRESSION: Negative. Electronically Signed  By: Monte Fantasia M.D.   On: 05/05/2018 09:19     ASSESSMENT and PLAN  1. External hemorrhoid 2. Bloody stool No red flag signs. Discussed supportive measures and treatment. Consider GI referral if not resolving as expected.   3. Pain and swelling of left ankle Ortho aware, does not need referral. Continue with RICE therapy. - DG Ankle Complete Left; Future  4. Depression with anxiety Not well controlled. Discussed treatment options. For now, she wants to leave meds as is, feels very situational at this time  Other orders - hydrocortisone (ANUSOL-HC) 2.5 % rectal cream; Place 1 application rectally 2 (two) times daily. - hydrocortisone (ANUSOL-HC) 25 MG suppository; Place 1 suppository (25 mg total) rectally 2 (two) times daily.  Return in about 4 weeks (around 06/02/2018).    Rutherford Guys, MD Primary Care at [place], [place] [address] Ph.  [phone] Fax [phone]

## 2018-05-09 ENCOUNTER — Ambulatory Visit (INDEPENDENT_AMBULATORY_CARE_PROVIDER_SITE_OTHER): Payer: 59

## 2018-05-09 ENCOUNTER — Encounter (INDEPENDENT_AMBULATORY_CARE_PROVIDER_SITE_OTHER): Payer: Self-pay | Admitting: Orthopaedic Surgery

## 2018-05-09 ENCOUNTER — Ambulatory Visit (INDEPENDENT_AMBULATORY_CARE_PROVIDER_SITE_OTHER): Payer: 59 | Admitting: Orthopaedic Surgery

## 2018-05-09 ENCOUNTER — Other Ambulatory Visit: Payer: Self-pay

## 2018-05-09 VITALS — BP 126/86 | HR 80 | Ht 63.5 in | Wt 130.0 lb

## 2018-05-09 DIAGNOSIS — M25572 Pain in left ankle and joints of left foot: Secondary | ICD-10-CM

## 2018-05-09 DIAGNOSIS — M542 Cervicalgia: Secondary | ICD-10-CM | POA: Insufficient documentation

## 2018-05-09 DIAGNOSIS — M25552 Pain in left hip: Secondary | ICD-10-CM | POA: Insufficient documentation

## 2018-05-09 DIAGNOSIS — M25551 Pain in right hip: Secondary | ICD-10-CM | POA: Diagnosis not present

## 2018-05-09 NOTE — Progress Notes (Signed)
Office Visit Note   Patient: [name]           Date of Birth: [birth year]           MRN: [id number] Visit Date: 05/09/2018              Requested by: Rutherford Guys, MD [address] [place], [place] [address] PCP: Rutherford Guys, MD   Assessment & Plan: Visit Diagnoses:  1. Pain in left ankle and joints of left foot   2. Pain in right hip   3. Neck pain     Plan: And over nearly 45 minutes regarding all of her problems.  She has had a prior MRI scan of her right hip demonstrating a labral tear.  She still having symptoms in her groin and I think it is worth trying an intra-articular cortisone injection.  We will schedule.  [name] also had a chronic problem with her cervical spine over many years.  She is considerable chiropractor treatments and over-the-counter medicines with little if any permanent relief.  On occasion she does have some numbness and tingling into 1 of the other upper extremity predominantly in the C8 nerve distribution.  I think at this point is worth obtaining an MRI scan.    Also has had recurrent left ankle sprains.  Recent films were negative after an injury with symptoms localized to the lateral ankle.  She has had prior therapy and does have an ankle support.  She was not wearing the support at the time of her injury.  I did obtain stress views of her left ankle did not see a significant tilt of the talus.  I want her to wear the ankle support and work on her exercises.  At this point I would not entertain surgical reconstruction of the lateral ligaments  Follow-Up Instructions: No follow-ups on file.   Orders:  Orders Placed This Encounter  Procedures  . XR Ankle 2 Views Left  . MR Cervical Spine w/o contrast  . Ambulatory referral to Physical Medicine Rehab   No orders of the defined types were placed in this encounter.     Procedures: No procedures performed   Clinical Data: No additional findings.   Subjective: Chief Complaint    Patient presents with  . Right Hip - Follow-up  . Left Ankle - Pain  . Neck Pain    Follow up  Patient presents today for follow up. She was here last August for her neck and right hip pain. She had an MRI in August of last year for her hip. She continues to hurt in her groin and gets worse with movement. Her neck pain switches from side to side. She said that she has radiation down both upper extremities and into her middle fingers. She said that the pain in her neck triggers migraines. Her motion in her neck is limited due to her pain. She also presents today for an ankle sprain. She sprained it 5 weeks ago and has sprained it multiple times before that. She saw her PCP last week and had x-rays. She wraps it daily and ices as needed.  Prior MRI scan of her right hip demonstrated tear of the labrum.  Still having symptoms in the area of her groin with certain motions.  She has been feeling a pop and a click.  Has chronic problems with the cervical spine as previously noted in her office notes.  She has had multiple chiropractic treatments and over-the-counter medicines  with persistent pain in her neck and on occasion referred pain and radiculopathy to 1 of the other upper extremity.  Prior films revealed straightening of the normal lordotic curve with areas of degenerative change.  [name] has a history of recurrent ankle sprains on the left.  Has had prior therapy and does wear an ankle support on occasion.  She had another ankle sprain recently.  Films were negative for fracture  HPI  Review of Systems   Objective: Vital Signs: BP 126/86   Pulse 80   Ht 5' 3.5" (1.613 m)   Wt 130 lb (59 kg)   BMI 22.67 kg/m   Physical Exam Constitutional:      Appearance: She is well-developed.  Eyes:     Pupils: Pupils are equal, round, and reactive to light.  Pulmonary:     Effort: Pulmonary effort is normal.  Skin:    General: Skin is warm and dry.  Neurological:     Mental Status: She  is alert and oriented to person, place, and time.  Psychiatric:        Behavior: Behavior normal.     Ortho Exam awake alert and oriented x3.  Comfortable sitting.  Does have some pain in the center of her right groin with certain motions I could not reproduce a pop or click.  Straight leg raise was negative.  No back pain.  Excellent range of motion of the cervical spine with some discomfort referable to the posterior cervical musculature with certain motions.  I thought she had very minimal loss of movement.  She did experience some tingling into the ulnar 2 digits of her left hand with neck extension. Left ankle with some tenderness over the anterior talofibular fibulocalcaneal ligaments.  I was not sure she had an increased talar tilt but thought she had little bit increased anterior drawer sign compared to the opposite ankle.  No swelling.  No ecchymosis.  Neurologically intact.  Specialty Comments:  No specialty comments available.  Imaging: Xr Ankle 2 Views Left  Result Date: 05/09/2018 Stress views of the left ankle were obtained with very minimal tilting of less than 5 degrees no acute changes    PMFS History: Patient Active Problem List   Diagnosis Date Noted  . Pain in left ankle and joints of left foot 05/09/2018  . Pain in right hip 05/09/2018  . Neck pain 05/09/2018  . Chronic rhinitis 11/12/2017  . Angioedema 11/12/2017  . Allergic reaction 11/12/2017  . Chronic sinusitis 11/12/2017  . Cervical intraepithelial neoplasia grade 1 10/01/2017  . GAD (generalized anxiety disorder) 09/17/2014  . ACL graft tear ([place]) 08/10/2014  . S/P ACL reconstruction 08/10/2014  . Migraine headache 05/15/2010   Past Medical History:  Diagnosis Date  . ACL graft tear (HCC)    left knee  . Allergy   . Anxiety   . Complication of anesthesia    woke up during surgery  . Depression   . Family history of adverse reaction to anesthesia     mom is difficult to put to sleep  . IUD  (intrauterine device) in place   . Knee pain, right   . Migraine headache   . Sleep paralysis     Family History  Problem Relation Age of Onset  . Goiter Father   . Hyperlipidemia Father   . Hypertension Maternal Grandmother   . Stroke Maternal Grandfather   . Cancer Maternal Grandfather   . Stroke Paternal Grandmother     Past  Surgical History:  Procedure Laterality Date  . ANTERIOR CRUCIATE LIGAMENT REPAIR Left 08/10/2014   Procedure: ANTERIOR CRUCIATE LIGAMENT (ACL) REPAIR;  Surgeon: Sydnee Cabal, MD;  Location: [place];  Service: Orthopedics;  Laterality: Left;  . KNEE ARTHROSCOPY Left 08/10/2014   Procedure: ARTHROSCOPY KNEE DEBRIDEMENT ALLOGRAFT ACL REVISION RECONSTRUCTION;  Surgeon: Sydnee Cabal, MD;  [facility];  Service: Orthopedics;  Laterality: Left;  . KNEE ARTHROSCOPY W/ ACL RECONSTRUCTION     Social History   Occupational History  . Not on file  Tobacco Use  . Smoking status: Never Smoker  . Smokeless tobacco: Never Used  Substance and Sexual Activity  . Alcohol use: Yes    Alcohol/week: 4.0 standard drinks    Types: 4 Glasses of wine per week  . Drug use: No  . Sexual activity: Yes

## 2018-05-12 ENCOUNTER — Other Ambulatory Visit: Payer: Self-pay

## 2018-05-12 ENCOUNTER — Encounter (INDEPENDENT_AMBULATORY_CARE_PROVIDER_SITE_OTHER): Payer: Self-pay | Admitting: Physical Medicine and Rehabilitation

## 2018-05-12 ENCOUNTER — Ambulatory Visit (INDEPENDENT_AMBULATORY_CARE_PROVIDER_SITE_OTHER): Payer: 59 | Admitting: Physical Medicine and Rehabilitation

## 2018-05-12 ENCOUNTER — Ambulatory Visit (INDEPENDENT_AMBULATORY_CARE_PROVIDER_SITE_OTHER): Payer: Self-pay

## 2018-05-12 VITALS — Temp 98.4°F

## 2018-05-12 DIAGNOSIS — M25551 Pain in right hip: Secondary | ICD-10-CM | POA: Diagnosis not present

## 2018-05-12 NOTE — Progress Notes (Signed)
Pt states pain in right hip (groin pain). Pt sates pain started a year ago. Pt states certain workouts and squatting makes pain worse, nothing helps with pain.   .Numeric Pain Rating Scale and Functional Assessment Average Pain 8   In the last MONTH (on 0-10 scale) has pain interfered with the following?  1. General activity like being  able to carry out your everyday physical activities such as walking, climbing stairs, carrying groceries, or moving a chair?  Rating(7)    -Dye Allergies.

## 2018-05-12 NOTE — Progress Notes (Signed)
[name] - 33 y.o. female MRN [id number]  Date of birth: 1985-06-21  Office Visit Note: Visit Date: 05/12/2018 PCP: Rutherford Guys, MD Referred by: Rutherford Guys, MD  Subjective: Chief Complaint  Patient presents with  . Right Hip - Pain   HPI: [name] is a 33 y.o. female who comes in today At the request of Joni Fears, MD for diagnostic hopefully therapeutic anesthetic hip arthrogram on the right.  Patient's had prior MRI arthrogram showing annular tear.  She is getting some pain in the right groin particularly when she goes from a deep squat to standing.  She gets a mechanical pinching sensation.  She does demonstrate this for [place] today.  ROS Otherwise per HPI.  Assessment & Plan: Visit Diagnoses:  1. Pain in right hip     Plan: Findings:  Patient did not get much relief during the anesthetic phase of the injection still had this pinching sensation when she went from deep squat to standing.  She is going to write down her symptoms and symptom relief over the next few hours to give to Dr. Durward Fortes.    Meds & Orders: No orders of the defined types were placed in this encounter.   Orders Placed This Encounter  Procedures  . Large Joint Inj: R hip joint  . XR C-ARM NO REPORT    Follow-up: No follow-ups on file.   Procedures: Large Joint Inj: R hip joint on 05/12/2018 2:58 PM Indications: pain and diagnostic evaluation Details: 22 G needle, anterior approach  Arthrogram: Yes  Medications: 3 mL bupivacaine 0.5 %; 80 mg triamcinolone acetonide 40 MG/ML Outcome: tolerated well, no immediate complications  Arthrogram demonstrated excellent flow of contrast throughout the joint surface without extravasation or obvious defect.  The patient did not have relief of symptoms during the anesthetic phase of the injection.  Procedure, treatment alternatives, risks and benefits explained, specific risks discussed. Consent was given by the patient. Immediately prior  to procedure a time out was called to verify the correct patient, procedure, equipment, support staff and site/side marked as required. Patient was prepped and draped in the usual sterile fashion.      No notes on file   Clinical History: MRI OF THE RIGHT HIP WITH CONTRAST (MR ARTHROGRAM)  TECHNIQUE: Multiplanar, multisequence MR imaging was performed following the administration of intra-articular contrast.  CONTRAST:  See injection documentation.  COMPARISON:  Right hip x-rays dated October 09, 2017.  FINDINGS: Bones: There is no evidence of acute fracture, dislocation or avascular necrosis. The visualized bony pelvis appears normal. The visualized sacroiliac joints and symphysis pubis appear normal.  Articular cartilage and labrum  Articular cartilage: No focal chondral defect or subchondral signal abnormality identified.  Labrum: Abnormal contrast signal extending into the anterior labrum. Degeneration and fraying of the anterior superior and superior labrum. No paralabral cyst.  Joint or bursal effusion  Joint effusion: The right hip joint is distended with intra-articular contrast. No left hip joint effusion.  Bursae: No focal periarticular fluid collection.  Muscles and tendons  Muscles and tendons: The visualized sartorius, rectus femoris, gluteus, hamstring and iliopsoas tendons appear normal. No muscle edema or atrophy.  Other findings  Miscellaneous: IUD within the endometrial canal. The visualized internal pelvic contents appear unremarkable.  IMPRESSION: 1. Anterior labral tear. Degeneration and fraying of the anterior superior and superior labrum.   Electronically Signed   By: Titus Dubin M.D.   On: 10/24/2017 09:51   She reports that she has  never smoked. She has never used smokeless tobacco. No results for input(s): HGBA1C, LABURIC in the last 8760 hours.  Objective:  VS:  HT:    WT:   BMI:     BP:   HR: bpm   TEMP:98.4 F (36.9 C)(Oral)  RESP:  Physical Exam  Ortho Exam Imaging: No results found.  Past Medical/Family/Surgical/Social History: Medications & Allergies reviewed per EMR, new medications updated. Patient Active Problem List   Diagnosis Date Noted  . Pain in left ankle and joints of left foot 05/09/2018  . Pain in right hip 05/09/2018  . Neck pain 05/09/2018  . Chronic rhinitis 11/12/2017  . Angioedema 11/12/2017  . Allergic reaction 11/12/2017  . Chronic sinusitis 11/12/2017  . Cervical intraepithelial neoplasia grade 1 10/01/2017  . GAD (generalized anxiety disorder) 09/17/2014  . ACL graft tear ([place]) 08/10/2014  . S/P ACL reconstruction 08/10/2014  . Migraine headache 05/15/2010   Past Medical History:  Diagnosis Date  . ACL graft tear (HCC)    left knee  . Allergy   . Anxiety   . Complication of anesthesia    woke up during surgery  . Depression   . Family history of adverse reaction to anesthesia     mom is difficult to put to sleep  . IUD (intrauterine device) in place   . Knee pain, right   . Migraine headache   . Sleep paralysis    Family History  Problem Relation Age of Onset  . Goiter Father   . Hyperlipidemia Father   . Hypertension Maternal Grandmother   . Stroke Maternal Grandfather   . Cancer Maternal Grandfather   . Stroke Paternal Grandmother    Past Surgical History:  Procedure Laterality Date  . ANTERIOR CRUCIATE LIGAMENT REPAIR Left 08/10/2014   Procedure: ANTERIOR CRUCIATE LIGAMENT (ACL) REPAIR;  Surgeon: Sydnee Cabal, MD;  Location: [place];  Service: Orthopedics;  Laterality: Left;  . KNEE ARTHROSCOPY Left 08/10/2014   Procedure: ARTHROSCOPY KNEE DEBRIDEMENT ALLOGRAFT ACL REVISION RECONSTRUCTION;  Surgeon: Sydnee Cabal, MD;  [facility];  Service: Orthopedics;  Laterality: Left;  . KNEE ARTHROSCOPY W/ ACL RECONSTRUCTION     Social History   Occupational History  . Not on file    Tobacco Use  . Smoking status: Never Smoker  . Smokeless tobacco: Never Used  Substance and Sexual Activity  . Alcohol use: Yes    Alcohol/week: 4.0 standard drinks    Types: 4 Glasses of wine per week  . Drug use: No  . Sexual activity: Yes

## 2018-05-14 MED ORDER — TRIAMCINOLONE ACETONIDE 40 MG/ML IJ SUSP
80.0000 mg | INTRAMUSCULAR | Status: AC | PRN
  Administered 2018-05-12: 80 mg via INTRA_ARTICULAR

## 2018-05-14 MED ORDER — BUPIVACAINE HCL 0.5 % IJ SOLN
3.0000 mL | INTRAMUSCULAR | Status: AC | PRN
  Administered 2018-05-12: 3 mL via INTRA_ARTICULAR

## 2018-05-20 ENCOUNTER — Telehealth (INDEPENDENT_AMBULATORY_CARE_PROVIDER_SITE_OTHER): Payer: Self-pay | Admitting: Orthopaedic Surgery

## 2018-05-20 NOTE — Telephone Encounter (Signed)
Patient called requesting a return call to schedule her MRI at another facility.  Patient states she had a difficult time scheduling an appointment with the office and the person she spoke with on the phone told her she needed to bring $1,000 to her appointment.  Patient was "very unhappy with how she was treated and doesn't want to give them her business."

## 2018-05-20 NOTE — Telephone Encounter (Signed)
Please help.

## 2018-05-23 ENCOUNTER — Ambulatory Visit (INDEPENDENT_AMBULATORY_CARE_PROVIDER_SITE_OTHER): Payer: 59 | Admitting: Orthopaedic Surgery

## 2018-05-23 ENCOUNTER — Other Ambulatory Visit: Payer: Self-pay | Admitting: Family Medicine

## 2018-05-23 DIAGNOSIS — F411 Generalized anxiety disorder: Secondary | ICD-10-CM

## 2018-05-23 NOTE — Telephone Encounter (Signed)
Requested medication (s) are due for refill today: yes  Requested medication (s) are on the active medication list: yes  Last refill:  04/27/18  Future visit scheduled: yes  Notes to clinic:  Medication refill not delegated to NT to refill   Requested Prescriptions  Pending Prescriptions Disp Refills   clonazePAM (KLONOPIN) 0.5 MG tablet [Pharmacy Med Name: CLONAZEPAM 0.5MG  TABLETS] 30 tablet     Sig: TAKE 1 TABLET(0.5 MG) BY MOUTH DAILY AS NEEDED FOR ANXIETY     Not Delegated - Psychiatry:  Anxiolytics/Hypnotics Failed - 05/23/2018  5:21 PM      Failed - This refill cannot be delegated      Failed - Urine Drug Screen completed in last 360 days.      Passed - Valid encounter within last 6 months    Recent Outpatient Visits          2 weeks ago External hemorrhoid   [facility], Lilia Argue, MD   5 months ago Thrombocytosis [facility])   [facility], Lilia Argue, MD   7 months ago Cervical pain (neck)   [facility], Lilia Argue, MD   9 months ago GAD (generalized anxiety disorder)   [facility], Lilia Argue, MD   1 year ago GAD (generalized anxiety disorder)   [facility] at [facility], [place]      Future Appointments            In 1 week Rutherford Guys, MD Primary Care at [place], [facility]

## 2018-05-24 NOTE — Telephone Encounter (Signed)
Please advise on clonazepam refill. Last filled for 30 day supply and last seen 05/05/2018

## 2018-05-29 ENCOUNTER — Telehealth: Payer: Self-pay | Admitting: Family Medicine

## 2018-05-29 NOTE — Telephone Encounter (Signed)
05/29/2018 - PATIENT HAS AN APPOINTMENT TO SEE DR. IRMA ON Thursday 06/05/2018 AT 3:20pm. I LEFT HER A VOICE MAIL TO GET HER CONVERTED TO A TELEMED OR WEBEX VISIT. [facility]

## 2018-06-05 ENCOUNTER — Telehealth (INDEPENDENT_AMBULATORY_CARE_PROVIDER_SITE_OTHER): Payer: 59 | Admitting: Family Medicine

## 2018-06-05 ENCOUNTER — Other Ambulatory Visit: Payer: Self-pay

## 2018-06-05 DIAGNOSIS — M25551 Pain in right hip: Secondary | ICD-10-CM

## 2018-06-05 DIAGNOSIS — K644 Residual hemorrhoidal skin tags: Secondary | ICD-10-CM

## 2018-06-05 DIAGNOSIS — M25572 Pain in left ankle and joints of left foot: Secondary | ICD-10-CM

## 2018-06-05 DIAGNOSIS — F418 Other specified anxiety disorders: Secondary | ICD-10-CM

## 2018-06-05 DIAGNOSIS — M25472 Effusion, left ankle: Secondary | ICD-10-CM

## 2018-06-05 MED ORDER — HYDROCORTISONE 2.5 % RE CREA: 1.0000 "application " | TOPICAL_CREAM | Freq: Two times a day (BID) | RECTAL | 2 refills | Status: DC

## 2018-06-05 NOTE — Progress Notes (Signed)
Chief Complaint : f/u hemorrhoid depression and anxiety screening done PHQ 9 score 13  And GAD7 score 14  1.  Feeling nervous, anxious, or on edge  3 2.  Not being able to stop or control worrying  3 3.  Worrying too much about different things  3 4.  Trouble relaxing  3 5.  Being so restless that it's hard to sit still  0 6.  Becoming easily annoyed or irritable  2 7.  Feeling afraid as if something awful might happen  0

## 2018-06-05 NOTE — Progress Notes (Signed)
Virtual Visit via telephone Note  I connected with patient on 06/05/18 at 430pm by telephone and verified that I am speaking with the correct person using two identifiers. [name] is currently located at home and patient is currently with her during visit. The provider, Rutherford Guys, MD is located in their office at time of visit.  I discussed the limitations, risks, security and privacy concerns of performing an evaluation and management service by telephone and the availability of in person appointments. I also discussed with the patient that there may be a patient responsible charge related to this service. The patient expressed understanding and agreed to proceed.   HPI ? 33 yo F with past medical history GAD, migraines, DDD of neck, chronic Right hip pain from labral tear, angioedema, chronic rhinitis who presents today for routine follow up  Last OV march 2020 Treated for external hemorrhoids, bleeding improved, but not resolved. Still using external hydrocortisone not daily Reports normal BMs  Had hip arteriogram, confirmed labral tear Patient has not gotten much pain relief Next steps would be surgery Stress xrays of ankle was negative Recommended strengthening of ankle  ENT and neck MRI appts have been postponed Left shoulder also having pinched nerve feeling, with radiation down into her axilla down to her 4th and 5th fingers Will be going to a different imaging place for MRI  Anxiety is being challenged given current situations But overall she is doing ok   Fall Risk  06/05/2018 05/05/2018 12/23/2017 10/01/2017 08/13/2017  Falls in the past year? 1 1 No No No  Number falls in past yr: 0 0 - - -  Injury with Fall? 1 0 - - -  Comment L .ankle around Feb 2020 - - - -  Follow up Falls evaluation completed - - - -     Depression screen [facility] 2/9 06/05/2018 05/05/2018 05/05/2018  Decreased Interest 3 1 0  Down, Depressed, Hopeless 3 2 0  PHQ - 2 Score 6 3 0  Altered  sleeping 3 3 -  Tired, decreased energy 3 3 -  Change in appetite 0 1 -  Feeling bad or failure about yourself  0 1 -  Trouble concentrating 1 2 -  Moving slowly or fidgety/restless 0 0 -  Suicidal thoughts 0 0 -  PHQ-9 Score 13 13 -  Difficult doing work/chores Very difficult Extremely dIfficult -  Some recent data might be hidden    Allergies  Allergen Reactions  . Molds & Smuts Swelling    Prior to Admission medications   Medication Sig Start Date End Date Taking? Authorizing Provider  clonazePAM (KLONOPIN) 0.5 MG tablet TAKE 1 TABLET(0.5 MG) BY MOUTH DAILY AS NEEDED FOR ANXIETY 05/24/18  Yes Rutherford Guys, MD  cyclobenzaprine (FLEXERIL) 10 MG tablet Take 1 tablet (10 mg total) by mouth at bedtime. 10/01/17  Yes Rutherford Guys, MD  DULoxetine (CYMBALTA) 60 MG capsule Take 1 capsule (60 mg total) by mouth daily. 01/23/18  Yes Rutherford Guys, MD  hydrocortisone (ANUSOL-HC) 2.5 % rectal cream Place 1 application rectally 2 (two) times daily. 05/05/18  Yes Rutherford Guys, MD  SUMAtriptan (IMITREX) 100 MG tablet take 1 tablet by mouth immediately may repeat after 2 hours if needed for MIGRAINE 12/23/17  Yes Rutherford Guys, MD  SUMAtriptan 6 MG/0.5ML SOAJ Inject 0.5 mLs into the skin daily as needed. 12/23/17  Yes Rutherford Guys, MD  azelastine (ASTELIN) 0.1 % nasal spray Place 2 sprays into  both nostrils 2 (two) times daily. Patient not taking: Reported on 06/05/2018 12/23/17   Rutherford Guys, MD  hydrocortisone (ANUSOL-HC) 25 MG suppository Place 1 suppository (25 mg total) rectally 2 (two) times daily. 05/05/18   Rutherford Guys, MD    Past Medical History:  Diagnosis Date  . ACL graft tear (HCC)    left knee  . Allergy   . Anxiety   . Complication of anesthesia    woke up during surgery  . Depression   . Family history of adverse reaction to anesthesia     mom is difficult to put to sleep  . IUD (intrauterine device) in place   . Knee pain, right   . Migraine  headache   . Sleep paralysis     Past Surgical History:  Procedure Laterality Date  . ANTERIOR CRUCIATE LIGAMENT REPAIR Left 08/10/2014   Procedure: ANTERIOR CRUCIATE LIGAMENT (ACL) REPAIR;  Surgeon: Sydnee Cabal, MD;  Location: [place];  Service: Orthopedics;  Laterality: Left;  . KNEE ARTHROSCOPY Left 08/10/2014   Procedure: ARTHROSCOPY KNEE DEBRIDEMENT ALLOGRAFT ACL REVISION RECONSTRUCTION;  Surgeon: Sydnee Cabal, MD;  Location: [facility];  Service: Orthopedics;  Laterality: Left;  . KNEE ARTHROSCOPY W/ ACL RECONSTRUCTION      Social History   Tobacco Use  . Smoking status: Never Smoker  . Smokeless tobacco: Never Used  Substance Use Topics  . Alcohol use: Yes    Alcohol/week: 4.0 standard drinks    Types: 4 Glasses of wine per week    Family History  Problem Relation Age of Onset  . Goiter Father   . Hyperlipidemia Father   . Hypertension Maternal Grandmother   . Stroke Maternal Grandfather   . Cancer Maternal Grandfather   . Stroke Paternal Grandmother     ROS Per hpi  Objective  Vitals as reported by the patient: none  There were no vitals filed for this visit.  ASSESSMENT and PLAN  1. External hemorrhoid Improved but not resolved. Another 2 weeks of topical hydrocortisone with sitz baths. If not resolved, she will call and I will refer to gen surg  2. Depression with anxiety Controlled. Continue current regime.  She will call when refills needed  3. Pain and swelling of left ankle 4. Pain in right hip Managed by ortho  Other orders - hydrocortisone (ANUSOL-HC) 2.5 % rectal cream; Place 1 application rectally 2 (two) times daily.  FOLLOW-UP: patient will schedule after she gets ortho and ENT workups done   The above assessment and management plan was discussed with the patient. The patient verbalized understanding of and has agreed to the management plan. Patient is aware to call the clinic if symptoms persist or  worsen. Patient is aware when to return to the clinic for a follow-up visit. Patient educated on when it is appropriate to go to the emergency department.    I provided 18 minutes of non-face-to-face time during this encounter.  Rutherford Guys, MD Primary Care at [place], [place] [address] Ph.  [phone] Fax [phone]

## 2018-06-24 ENCOUNTER — Encounter: Payer: Self-pay | Admitting: Neurology

## 2018-07-08 ENCOUNTER — Ambulatory Visit: Payer: 59 | Admitting: Neurology

## 2018-07-14 ENCOUNTER — Other Ambulatory Visit: Payer: Self-pay

## 2018-07-14 ENCOUNTER — Ambulatory Visit: Payer: 59 | Admitting: Neurology

## 2018-07-14 NOTE — Progress Notes (Signed)
No show

## 2018-07-17 ENCOUNTER — Telehealth (INDEPENDENT_AMBULATORY_CARE_PROVIDER_SITE_OTHER): Payer: 59 | Admitting: Family Medicine

## 2018-07-17 DIAGNOSIS — K5900 Constipation, unspecified: Secondary | ICD-10-CM

## 2018-07-17 DIAGNOSIS — K625 Hemorrhage of anus and rectum: Secondary | ICD-10-CM

## 2018-07-17 DIAGNOSIS — M25552 Pain in left hip: Secondary | ICD-10-CM

## 2018-07-17 NOTE — Progress Notes (Signed)
Still having trouble with the stomach and blood in stool with feelings of constipation. Also having trouble with both hips, Did imaging has the tear in the hip. Did cortisone shot which did not help. Needing refills on medication. Anxiety medication is working, no issues there.

## 2018-07-17 NOTE — Progress Notes (Signed)
Virtual Visit Note  I connected with patient on 07/17/18 at 417pm by phone and verified that I am speaking with the correct person using two identifiers. [name] is currently located at home and patient is currently with them during visit. The provider, Rutherford Guys, MD is located in their office at time of visit.  I discussed the limitations, risks, security and privacy concerns of performing an evaluation and management service by telephone and the availability of in person appointments. I also discussed with the patient that there may be a patient responsible charge related to this service. The patient expressed understanding and agreed to proceed.   CC: constipation, red blood in stool  HPI ? 33 yo F with past medical history GAD, migraines, DDD of neck, chronic Right hip pain from labral tear, angioedema, chronic rhinitiswho presents today forfollowup on constipation and hemorrhoids  Continues to suffer from constipation Having bloating, passing lots of air Still having bright blood with stool, hemorrhoid has gone down, no pain with BM Has not been taking anything for constipation She stopped taking vitamins to see if it would help Has occ mild nausea but no vomiting Having occ mild stool urgency, results lots of passing of air, blood and scant stool flecks Eating less but weight stable Having night sweats Denies any abnormal vaginal bleeding or discharge  Had arthrogram and cortisone injection of right hip Now having issues with left hip for past 6 weeks Requesting a referral   Allergies  Allergen Reactions  . Molds & Smuts Swelling    Prior to Admission medications   Medication Sig Start Date End Date Taking? Authorizing Provider  azelastine (ASTELIN) 0.1 % nasal spray Place 2 sprays into both nostrils 2 (two) times daily. Patient not taking: Reported on 06/05/2018 12/23/17   Rutherford Guys, MD  clonazePAM (KLONOPIN) 0.5 MG tablet TAKE 1 TABLET(0.5 MG) BY  MOUTH DAILY AS NEEDED FOR ANXIETY 05/24/18   Rutherford Guys, MD  cyclobenzaprine (FLEXERIL) 10 MG tablet Take 1 tablet (10 mg total) by mouth at bedtime. 10/01/17   Rutherford Guys, MD  DULoxetine (CYMBALTA) 60 MG capsule Take 1 capsule (60 mg total) by mouth daily. 01/23/18   Rutherford Guys, MD  hydrocortisone (ANUSOL-HC) 2.5 % rectal cream Place 1 application rectally 2 (two) times daily. 06/05/18   Rutherford Guys, MD  SUMAtriptan (IMITREX) 100 MG tablet take 1 tablet by mouth immediately may repeat after 2 hours if needed for MIGRAINE 12/23/17   Rutherford Guys, MD  SUMAtriptan 6 MG/0.5ML SOAJ Inject 0.5 mLs into the skin daily as needed. 12/23/17   Rutherford Guys, MD    Past Medical History:  Diagnosis Date  . ACL graft tear (HCC)    left knee  . Allergy   . Anxiety   . Complication of anesthesia    woke up during surgery  . Depression   . Family history of adverse reaction to anesthesia     mom is difficult to put to sleep  . IUD (intrauterine device) in place   . Knee pain, right   . Migraine headache   . Sleep paralysis     Past Surgical History:  Procedure Laterality Date  . ANTERIOR CRUCIATE LIGAMENT REPAIR Left 08/10/2014   Procedure: ANTERIOR CRUCIATE LIGAMENT (ACL) REPAIR;  Surgeon: Sydnee Cabal, MD;  Location: [place];  Service: Orthopedics;  Laterality: Left;  . KNEE ARTHROSCOPY Left 08/10/2014   Procedure: ARTHROSCOPY KNEE DEBRIDEMENT ALLOGRAFT ACL REVISION  RECONSTRUCTION;  Surgeon: Sydnee Cabal, MD;  Location: [facility];  Service: Orthopedics;  Laterality: Left;  . KNEE ARTHROSCOPY W/ ACL RECONSTRUCTION      Social History   Tobacco Use  . Smoking status: Never Smoker  . Smokeless tobacco: Never Used  Substance Use Topics  . Alcohol use: Yes    Alcohol/week: 4.0 standard drinks    Types: 4 Glasses of wine per week    Family History  Problem Relation Age of Onset  . Goiter Father   . Hyperlipidemia Father   .  Hypertension Maternal Grandmother   . Stroke Maternal Grandfather   . Cancer Maternal Grandfather   . Stroke Paternal Grandmother     ROS Per hpi  Objective  Vitals as reported by the patient: none   ASSESSMENT and PLAN  1. Bright red blood per rectum - Ambulatory referral to Gastroenterology  2. Constipation, unspecified constipation type Discussed miralax, high dose. - Ambulatory referral to Gastroenterology  3. Left hip pain  - referral made to [facility], Dr Garnette Czech  FOLLOW-UP: after GI   The above assessment and management plan was discussed with the patient. The patient verbalized understanding of and has agreed to the management plan. Patient is aware to call the clinic if symptoms persist or worsen. Patient is aware when to return to the clinic for a follow-up visit. Patient educated on when it is appropriate to go to the emergency department.    I provided 27 minutes of non-face-to-face time during this encounter.  Rutherford Guys, MD Primary Care at [place], [place] [address] Ph.  [phone] Fax [phone]

## 2018-07-22 ENCOUNTER — Other Ambulatory Visit: Payer: Self-pay

## 2018-07-22 ENCOUNTER — Ambulatory Visit
Admission: RE | Admit: 2018-07-22 | Discharge: 2018-07-22 | Disposition: A | Discharge status: Home / Self Care | Payer: 59 | Source: Ambulatory Visit | Attending: Orthopaedic Surgery | Admitting: Orthopaedic Surgery

## 2018-07-22 DIAGNOSIS — M542 Cervicalgia: Secondary | ICD-10-CM | POA: Diagnosis not present

## 2018-07-23 ENCOUNTER — Telehealth: Payer: Self-pay | Admitting: Neurology

## 2018-07-23 NOTE — Telephone Encounter (Signed)
°  Due to current COVID 19 pandemic, our office is severely reducing in office visits until further notice, in order to minimize the risk to our patients and healthcare providers.    Patient called in to reschedule her Video Visit with Dr. Jaynee Eagles. Patient verbalized understanding of the [email or web address] process and I have sent an e-mail to [email or web address] with link and instructions as well as my name and our office number. Patient understands that they will receive a call from RN to update chart.   Pt understands that although there may be some limitations with this type of visit, we will take all precautions to reduce any security or privacy concerns.  Pt understands that this will be treated like an in office visit and we will file with pt's insurance, and there may be a patient responsible charge related to this service.

## 2018-07-28 ENCOUNTER — Telehealth: Payer: Self-pay | Admitting: Orthopaedic Surgery

## 2018-07-28 NOTE — Telephone Encounter (Signed)
Patient called and scheduled her MRI follow-up appointment on 08/06/18.  Patient is requesting Dr. Durward Fortes also evaluate her left hip at that appointment due to having pain for approximately 2-3 months and is worried she might have a tear.  Patient states her pain is similar to her right hip.

## 2018-08-01 ENCOUNTER — Encounter: Payer: Self-pay | Admitting: Gastroenterology

## 2018-08-01 ENCOUNTER — Other Ambulatory Visit: Payer: Self-pay

## 2018-08-01 ENCOUNTER — Ambulatory Visit (INDEPENDENT_AMBULATORY_CARE_PROVIDER_SITE_OTHER): Payer: 59 | Admitting: Gastroenterology

## 2018-08-01 VITALS — Ht 63.5 in | Wt 128.0 lb

## 2018-08-01 DIAGNOSIS — K625 Hemorrhage of anus and rectum: Secondary | ICD-10-CM | POA: Diagnosis not present

## 2018-08-01 NOTE — Progress Notes (Signed)
TELEHEALTH VISIT  Referring Provider: Rutherford Guys, MD Primary Care Physician:  Rutherford Guys, MD   Tele-visit due to COVID-19 pandemic Patient requested visit virtually, consented to the virtual encounter via video enabled telemedicine application (Zoom) Contact made at: 11:04 08/01/18 Patient verified by name and date of birth Location of patient: Home Location provider: [facility] Names of persons participating: Me, patient, [name] CMA Time spent on telehealth visit: 27 minutes I discussed the limitations of evaluation and management by telemedicine. The patient expressed understanding and agreed to proceed.  Reason for Consultation:  Rectal bleeding   IMPRESSION:  Rectal bleeding not responding to hemorrhoid treatment Change in bowel habits No known family history of colon cancer or polyps  The differential for rectal bleeding is broad.  It includes outlet sources such as fissure or hemorrhoids, as well as polyps, mass, ulcers, and colitis.  Given this differential I am recommending a colonoscopy.  PLAN: High fiber diet recommended, drink at least 1.5-2 liters of water Daily stool bulking agent with psyllium or methylcellulose recommended if you are not using Miralax Colonoscopy  I consented the patient discussing the risks, benefits, and alternatives to endoscopic evaluation. In particular, we discussed the risks that include, but are not limited to, reaction to medication, cardiopulmonary compromise, bleeding requiring blood transfusion, aspiration resulting in pneumonia, perforation requiring surgery, lack of diagnosis, severe illness requiring hospitalization, and even death. We reviewed the risk of missed lesion including polyps or even cancer. The patient acknowledges these risks and asks that we proceed.    PLAN: Miralax as recommended by Dr. Pamella Pert Colonoscopy  I consented the patient discussing the risks, benefits, and alternatives to  endoscopic evaluation. In particular, we discussed the risks that include, but are not limited to, reaction to medication, cardiopulmonary compromise, bleeding requiring blood transfusion, aspiration resulting in pneumonia, perforation requiring surgery, lack of diagnosis, severe illness requiring hospitalization, and even death. We reviewed the risk of missed lesion including polyps or even cancer. The patient acknowledges these risks and asks that we proceed.   HPI: [name] is a 33 y.o. female referred by Dr. Pamella Pert for further evaluation of rectal bleeding. The history is obtained through the patient and her electronic health record. She has depression, migraines, DDD of neck, chronic right hip painfrom labral tear, angioedema, and chronic rhinitis.   Rectal bleeding and altered bowel habits since January. Diagnosed with external hemorrhoids. Associated abdominal discomfort and constipation.  Feels bloated and passing lots of air with defecation. No change in symptoms. No rectal pain. No tear. Feels like there is a fist inside pushing out.  No change in bowel habits. No straining. No sense of incomplete evacuation. No foreign body or rectal trauma. Anusol HC suppositories and external hydrocortisone rectal cream made the hemorrhoids smaller but she is still having bright red blood per rectum. Weight stable. No other associated symptoms. No identified exacerbating or relieving features.   Dr. Ardyth Gal last note from 06/05/18 recommends surgical referral if she is not responding to hydrocortisone and Sitz Baths.   Disrupting sex life. Feels a pressure and her husband hits this area during sex. No vaginal discharge or vaginal bleeding.   No known family history of colon cancer or polyps. No family history of uterine/endometrial cancer, pancreatic cancer or gastric/stomach cancer.  Past Medical History:  Diagnosis Date  . ACL graft tear (HCC)    left knee  . Allergy   . Anxiety   .  Complication of anesthesia  woke up during surgery  . Depression   . Family history of adverse reaction to anesthesia     mom is difficult to put to sleep  . IUD (intrauterine device) in place   . Knee pain, right   . Migraine headache   . Sleep paralysis     Past Surgical History:  Procedure Laterality Date  . ANTERIOR CRUCIATE LIGAMENT REPAIR Left 08/10/2014   Procedure: ANTERIOR CRUCIATE LIGAMENT (ACL) REPAIR;  Surgeon: Sydnee Cabal, MD;  Location: [facility];  Service: Orthopedics;  Laterality: Left;  . KNEE ARTHROSCOPY Left 08/10/2014   Procedure: ARTHROSCOPY KNEE DEBRIDEMENT ALLOGRAFT ACL REVISION RECONSTRUCTION;  Surgeon: Sydnee Cabal, MD;  Location: [facility];  Service: Orthopedics;  Laterality: Left;  . KNEE ARTHROSCOPY W/ ACL RECONSTRUCTION      Current Outpatient Medications  Medication Sig Dispense Refill  . azelastine (ASTELIN) 0.1 % nasal spray Place 2 sprays into both nostrils 2 (two) times daily. 30 mL 5  . clonazePAM (KLONOPIN) 0.5 MG tablet TAKE 1 TABLET(0.5 MG) BY MOUTH DAILY AS NEEDED FOR ANXIETY 30 tablet 2  . cyclobenzaprine (FLEXERIL) 10 MG tablet Take 1 tablet (10 mg total) by mouth at bedtime. 30 tablet 0  . DULoxetine (CYMBALTA) 60 MG capsule Take 1 capsule (60 mg total) by mouth daily. 30 capsule 5  . hydrocortisone (ANUSOL-HC) 2.5 % rectal cream Place 1 application rectally 2 (two) times daily. 30 g 2  . SUMAtriptan (IMITREX) 100 MG tablet take 1 tablet by mouth immediately may repeat after 2 hours if needed for MIGRAINE 25 tablet 5  . SUMAtriptan 6 MG/0.5ML SOAJ Inject 0.5 mLs into the skin daily as needed. 0.5 mL 3   No current facility-administered medications for this visit.     Allergies as of 08/01/2018 - Review Complete 08/01/2018  Allergen Reaction Noted  . Molds & smuts Swelling 05/13/2017    Family History  Problem Relation Age of Onset  . Goiter Father   . Hyperlipidemia Father   . Hypertension  Maternal Grandmother   . Stroke Maternal Grandfather   . Cancer Maternal Grandfather   . Stroke Paternal Grandmother     Social History   Socioeconomic History  . Marital status: Single    Spouse name: Not on file  . Number of children: 0  . Years of education: Not on file  . Highest education level: Not on file  Occupational History  . Not on file  Social Needs  . Financial resource strain: Not on file  . Food insecurity:    Worry: Not on file    Inability: Not on file  . Transportation needs:    Medical: Not on file    Non-medical: Not on file  Tobacco Use  . Smoking status: Never Smoker  . Smokeless tobacco: Never Used  Substance and Sexual Activity  . Alcohol use: Yes    Alcohol/week: 4.0 standard drinks    Types: 4 Glasses of wine per week  . Drug use: No  . Sexual activity: Yes  Lifestyle  . Physical activity:    Days per week: Not on file    Minutes per session: Not on file  . Stress: Not on file  Relationships  . Social connections:    Talks on phone: Not on file    Gets together: Not on file    Attends religious service: Not on file    Active member of club or organization: Not on file    Attends meetings of  clubs or organizations: Not on file    Relationship status: Not on file  . Intimate partner violence:    Fear of current or ex partner: Not on file    Emotionally abused: Not on file    Physically abused: Not on file    Forced sexual activity: Not on file  Other Topics Concern  . Not on file  Social History Narrative  . Not on file    Review of Systems: ALL ROS discussed and all others negative except listed in HPI.  Physical Exam: General: in no acute distress Neuro: Alert and appropriate Psych: Normal affect and normal insight Unable to perform a rectal exam given the telehealth encounter.    Pennye Beeghly L. Tarri Glenn, MD, MPH [facility] 08/01/2018, 11:07 AM

## 2018-08-01 NOTE — Patient Instructions (Signed)
I have recommended a colonoscopy.   I agree with using the Miralax between now and then.  Thank you for your patience with me and our technology today! Please stay home, safe, and healthy. I look forward to meeting you in person in the future.

## 2018-08-06 ENCOUNTER — Ambulatory Visit: Payer: 59 | Admitting: Orthopaedic Surgery

## 2018-08-08 ENCOUNTER — Telehealth: Payer: Self-pay | Admitting: Gastroenterology

## 2018-08-08 MED ORDER — NA SULFATE-K SULFATE-MG SULF 17.5-3.13-1.6 GM/177ML PO SOLN: 1.0000 | ORAL | 0 refills | Status: AC

## 2018-08-08 NOTE — Telephone Encounter (Signed)
Spoke to patient and scheduled her for colonoscopy per 08-03-2018 office note.

## 2018-08-08 NOTE — Telephone Encounter (Signed)
Patient said she is returning your call

## 2018-08-11 ENCOUNTER — Encounter: Payer: Self-pay | Admitting: Gastroenterology

## 2018-08-14 ENCOUNTER — Ambulatory Visit: Payer: 59 | Admitting: Orthopaedic Surgery

## 2018-08-18 ENCOUNTER — Telehealth: Payer: Self-pay | Admitting: *Deleted

## 2018-08-18 ENCOUNTER — Encounter: Payer: Self-pay | Admitting: *Deleted

## 2018-08-18 NOTE — Telephone Encounter (Signed)
Spoke with pt and confirmed pt using 2 identifiers. Chart updated including medications. No changes to history. She understands she will receive a call from the office tomorrow to check-in at 9 AM. She verbalized appreciation for the call.

## 2018-08-19 ENCOUNTER — Other Ambulatory Visit: Payer: Self-pay | Admitting: Family Medicine

## 2018-08-19 ENCOUNTER — Encounter: Payer: Self-pay | Admitting: Orthopaedic Surgery

## 2018-08-19 ENCOUNTER — Ambulatory Visit: Payer: Self-pay

## 2018-08-19 ENCOUNTER — Encounter: Payer: Self-pay | Admitting: Neurology

## 2018-08-19 ENCOUNTER — Ambulatory Visit (INDEPENDENT_AMBULATORY_CARE_PROVIDER_SITE_OTHER): Payer: 59 | Admitting: Orthopaedic Surgery

## 2018-08-19 ENCOUNTER — Other Ambulatory Visit: Payer: Self-pay

## 2018-08-19 ENCOUNTER — Ambulatory Visit (INDEPENDENT_AMBULATORY_CARE_PROVIDER_SITE_OTHER): Payer: 59 | Admitting: Neurology

## 2018-08-19 VITALS — BP 139/89 | HR 78 | Ht 63.5 in | Wt 129.0 lb

## 2018-08-19 DIAGNOSIS — G4484 Primary exertional headache: Secondary | ICD-10-CM

## 2018-08-19 DIAGNOSIS — G4483 Primary cough headache: Secondary | ICD-10-CM | POA: Diagnosis not present

## 2018-08-19 DIAGNOSIS — G8929 Other chronic pain: Secondary | ICD-10-CM | POA: Diagnosis not present

## 2018-08-19 DIAGNOSIS — M542 Cervicalgia: Secondary | ICD-10-CM

## 2018-08-19 DIAGNOSIS — R51 Headache with orthostatic component, not elsewhere classified: Secondary | ICD-10-CM

## 2018-08-19 DIAGNOSIS — H547 Unspecified visual loss: Secondary | ICD-10-CM

## 2018-08-19 DIAGNOSIS — M25552 Pain in left hip: Secondary | ICD-10-CM | POA: Diagnosis not present

## 2018-08-19 DIAGNOSIS — G43709 Chronic migraine without aura, not intractable, without status migrainosus: Secondary | ICD-10-CM | POA: Diagnosis not present

## 2018-08-19 DIAGNOSIS — R519 Headache, unspecified: Secondary | ICD-10-CM

## 2018-08-19 DIAGNOSIS — G43101 Migraine with aura, not intractable, with status migrainosus: Secondary | ICD-10-CM

## 2018-08-19 DIAGNOSIS — M25512 Pain in left shoulder: Secondary | ICD-10-CM | POA: Diagnosis not present

## 2018-08-19 MED ORDER — AJOVY 225 MG/1.5ML ~~LOC~~ SOSY: 225.0000 mg | PREFILLED_SYRINGE | SUBCUTANEOUS | 11 refills | Status: DC

## 2018-08-19 NOTE — Progress Notes (Signed)
[facility]    Provider:  Dr Jaynee Eagles Requesting Provider: Rutherford Guys, MD Primary Care Provider:  Rutherford Guys, MD  CC:  Migraines  Virtual Visit via Video Note  I connected with@ on 08/19/18 at  9:30 AM EDT by a video enabled telemedicine application and verified that I am speaking with the correct person using two identifiers. Patient is at home and physician is in the office.   I discussed the limitations of evaluation and management by telemedicine and the availability of in person appointments. The patient expressed understanding and agreed to proceed.  Melvenia Beam, MD  HPI:  [name] is a 33 y.o. female here as requested by Rutherford Guys, MD for migraines. Her first migraine was in church at the age of 70, she started seeing dark spots. Progressed within 30 minutes to a headache. Father's side of the family has migraines but father does not. She remembers crying it was so severe. She has never vomited from a migraine. She started taking a ton of ibuprofen, she kept taking ibuprofen. Around 16 she was diagnosed and prescribed imitrex. Imitrex works well when she has the migraines, she has tried Recruitment consultant and another triptan and she always goes back to imitrex. It takes about 30 minutes to an hour to kick. She loves the injections.Migraines improved in college. But in the past 3 years since she turned 29 they are worsening. She is seeing an ENT to evaluate her sinuses and look at her sinus cavities. She has a lot of facial pain and pain behind the eyes. She does not always have an aura. Can be unilateral, starts behind one eye, spreads to the face and back to the back of the head and it burns. Throbbing, severe, can't even touch her head, +photo/phonophobia,nausea,movement makes it worse. Pushing on trigger points may help. She has 15 migraine days a month, they can last up to 24 hours untreated but imitrex helps. She has been waking up with headaches for  the last 4 months, feeling very bad every single day, they had their house checked for mold and have tried to figure out why. Her headaches are positional and exertional and can hurt with bending over or valsalva, she is lethargic, not feeling well, sinus and allergies can trigger a migraines. She has chronic neck pain. She has an IUD, not planning on children in the next 3 years, discussed. No other focal neurologic deficits, associated symptoms, inciting events or modifiable factors.  Reviewed notes, labs and imaging from outside physicians, which showed:  TSH nml, cmp/cbc unremarkable  Personally reviewed MRI cervical spine images and agree with the following:  C5-6 shows shallow protrusion of the disc slightly more prominent towards the right. There is narrowing of the ventral subarachnoid space but no compression of the cord. There is mild encroachment upon the proximal foramina, right more than left. There is potential for irritation of either C6 nerve, though distinct compression is not demonstrated. Morphologic findings are slightly worse on the right.  I reviewed Dr. Ardyth Gal notes.  Patient suffers from constipation, bloating, gas.  Last being seen she had bright blood with stool, likely due to hemorrhoid.  Having occasional mild nausea but no vomiting, eating less but weight stable.  Having night sweats.  She had an arthrogram and cortisone injection of the right hip now having problems with the left hip.  She was given Imitrex for acute management.  ENT and MRI appointments were postponed but she did  eventually get the MRI still awaiting the ENT appointment.  Radiation down into her axilla down to her fourth and fifth fingers in the left shoulder.  She has anxiety and has been being treated with Cymbalta.  Review of Systems: Patient complains of symptoms per HPI as well as the following symptoms: headache. Pertinent negatives and positives per HPI. All others negative.   Social History    Socioeconomic History   Marital status: Married    Spouse name: Alex    Number of children: 0   Years of education: Not on file   Highest education level: Not on file  Occupational History   Not on file  Social Needs   Financial resource strain: Not on file   Food insecurity    Worry: Not on file    Inability: Not on file   Transportation needs    Medical: Not on file    Non-medical: Not on file  Tobacco Use   Smoking status: Never Smoker   Smokeless tobacco: Never Used  Substance and Sexual Activity   Alcohol use: Yes    Alcohol/week: 4.0 standard drinks    Types: 4 Glasses of wine per week   Drug use: No   Sexual activity: Yes  Lifestyle   Physical activity    Days per week: Not on file    Minutes per session: Not on file   Stress: Not on file  Relationships   Social connections    Talks on phone: Not on file    Gets together: Not on file    Attends religious service: Not on file    Active member of club or organization: Not on file    Attends meetings of clubs or organizations: Not on file    Relationship status: Not on file   Intimate partner violence    Fear of current or ex partner: Not on file    Emotionally abused: Not on file    Physically abused: Not on file    Forced sexual activity: Not on file  Other Topics Concern   Not on file  Social History Narrative   Lives at home with her husband and pets   Right handed   Caffeine: 1-2 cups daily    Family History  Problem Relation Age of Onset   Goiter Father    Hyperlipidemia Father    Hypertension Maternal Grandmother    Stroke Maternal Grandfather    Cancer Maternal Grandfather    Stroke Paternal Grandmother    Migraines Other        on her father's side    Past Medical History:  Diagnosis Date   ACL graft tear ([place])    left knee   Allergy    Anxiety    Complication of anesthesia    woke up during surgery   Depression    Family history of adverse  reaction to anesthesia     mom is difficult to put to sleep   IUD (intrauterine device) in place    Knee pain, right    Migraine headache    Sleep paralysis     Patient Active Problem List   Diagnosis Date Noted   Chronic migraine without aura without status migrainosus, not intractable 08/19/2018   Migraine with aura and with status migrainosus, not intractable 08/19/2018   Pain in left ankle and joints of left foot 05/09/2018   Pain in right hip 05/09/2018   Neck pain 05/09/2018   Chronic rhinitis 11/12/2017   Angioedema 11/12/2017  Allergic reaction 11/12/2017   Chronic sinusitis 11/12/2017   Cervical intraepithelial neoplasia grade 1 10/01/2017   GAD (generalized anxiety disorder) 09/17/2014   ACL graft tear ([place]) 08/10/2014   S/P ACL reconstruction 08/10/2014   Migraine headache 05/15/2010    Past Surgical History:  Procedure Laterality Date   ANTERIOR CRUCIATE LIGAMENT REPAIR Left 08/10/2014   Procedure: ANTERIOR CRUCIATE LIGAMENT (ACL) REPAIR;  Surgeon: Sydnee Cabal, MD;  Location: [facility];  Service: Orthopedics;  Laterality: Left;   KNEE ARTHROSCOPY Left 08/10/2014   Procedure: ARTHROSCOPY KNEE DEBRIDEMENT ALLOGRAFT ACL REVISION RECONSTRUCTION;  Surgeon: Sydnee Cabal, MD;  Location: [facility];  Service: Orthopedics;  Laterality: Left;   KNEE ARTHROSCOPY W/ ACL RECONSTRUCTION      Current Outpatient Medications  Medication Sig Dispense Refill   azelastine (ASTELIN) 0.1 % nasal spray Place 2 sprays into both nostrils 2 (two) times daily. (Patient not taking: Reported on 08/18/2018) 30 mL 5   clonazePAM (KLONOPIN) 0.5 MG tablet TAKE 1 TABLET(0.5 MG) BY MOUTH DAILY AS NEEDED FOR ANXIETY 30 tablet 2   cyclobenzaprine (FLEXERIL) 10 MG tablet Take 1 tablet (10 mg total) by mouth at bedtime. 30 tablet 0   DULoxetine (CYMBALTA) 60 MG capsule Take 1 capsule (60 mg total) by mouth daily. 30 capsule 5    Fremanezumab-vfrm (AJOVY) 225 MG/1.5ML SOSY Inject 225 mg into the skin every 30 (thirty) days. 1 mL 11   hydrocortisone (ANUSOL-HC) 2.5 % rectal cream Place 1 application rectally 2 (two) times daily. 30 g 2   Na Sulfate-K Sulfate-Mg Sulf 17.5-3.13-1.6 GM/177ML SOLN Take 1 kit by mouth as directed for 30 days. 354 mL 0   SUMAtriptan (IMITREX) 100 MG tablet take 1 tablet by mouth immediately may repeat after 2 hours if needed for MIGRAINE 25 tablet 5   SUMAtriptan 6 MG/0.5ML SOAJ Inject 0.5 mLs into the skin daily as needed. 0.5 mL 3   No current facility-administered medications for this visit.     Allergies as of 08/19/2018 - Review Complete 08/18/2018  Allergen Reaction Noted   Molds & smuts Swelling 05/13/2017    Vitals: There were no vitals taken for this visit. Last Weight:  Wt Readings from Last 1 Encounters:  08/01/18 128 lb (58.1 kg)   Last Height:   Ht Readings from Last 1 Encounters:  08/01/18 5' 3.5" (1.613 m)     Physical exam: Exam: Gen: NAD, conversant      CV: attempted, Could not perform over Web Video. Denies palpitations or chest pain or SOB. VS: Breathing at a normal rate. Weight appears within normal limits. Not febrile. Eyes: Conjunctivae clear without exudates or hemorrhage  Neuro: Detailed Neurologic Exam  Speech:    Speech is normal; fluent and spontaneous with normal comprehension.  Cognition:    The patient is oriented to person, place, and time;     recent and remote memory intact;     language fluent;     normal attention, concentration,     fund of knowledge Cranial Nerves:    The pupils are equal, round, and reactive to light. Attempted, Cannot perform fundoscopic exam. Visual fields are full to finger confrontation. Extraocular movements are intact.  The face is symmetric with normal sensation. The palate elevates in the midline. Hearing intact. Voice is normal. Shoulder shrug is normal. The tongue has normal motion without  fasciculations.   Coordination:    Normal finger to nose  Gait:    Normal native gait  Motor Observation:  no involuntary movements noted. Tone:    Appears normal  Posture:    Posture is normal. normal erect    Strength:    Strength is anti-gravity and symmetric in the upper and lower limbs.      Sensation: intact to LT     Reflex Exam:  DTR's:    Attempted, Could not perform over Web Video   Toes: Attempted Could not perform over Web Video  Clonus:   Attempted, Could not perform over Web Video     Assessment/Plan:  33 year old with chronic migraines with and without aura but given concerning symptoms without ever having any brain imaging, she needs a thorough examination.  MRI brain due to concerning symptoms of morning headaches, positional headaches,vision loss to look for space occupying mass, chiari or intracranial hypertension (pseudotumor), demyelination and other.  Discussed: There is increased risk for stroke in women with migraine with aura and a contraindication for the combined contraceptive pill for use by women who have migraine with aura. The risk for women with migraine without aura is lower. However other risk factors like smoking are far more likely to increase stroke risk than migraine. There is a recommendation for no smoking and for the use of OCPs without estrogen such as progestogen only pills particularly for women with migraine with aura.[name] People who have migraine headaches with auras may be 3 times more likely to have a stroke caused by a blood clot, compared to migraine patients who don't see auras. Women who take hormone-replacement therapy may be 30 percent more likely to suffer a clot-based stroke than women not taking medication containing estrogen. Other risk factors like smoking and high blood pressure may be  much more important."  Acute: Sumatriptan Preventative; Discussed orals such as Topiramate and the new injectables. She would like Ajovy  and will let me know about topiramate if needed.  Discussed: To prevent or relieve headaches, try the following: Cool Compress. Lie down and place a cool compress on your head.  Avoid headache triggers. If certain foods or odors seem to have triggered your migraines in the past, avoid them. A headache diary might help you identify triggers.  Include physical activity in your daily routine. Try a daily walk or other moderate aerobic exercise.  Manage stress. Find healthy ways to cope with the stressors, such as delegating tasks on your to-do list.  Practice relaxation techniques. Try deep breathing, yoga, massage and visualization.  Eat regularly. Eating regularly scheduled meals and maintaining a healthy diet might help prevent headaches. Also, drink plenty of fluids.  Follow a regular sleep schedule. Sleep deprivation might contribute to headaches Consider biofeedback. With this mind-body technique, you learn to control certain bodily functions -- such as muscle tension, heart rate and blood pressure -- to prevent headaches or reduce headache pain.    Proceed to emergency room if you experience new or worsening symptoms or symptoms do not resolve, if you have new neurologic symptoms or if headache is severe, or for any concerning symptom.   Provided education and documentation from American headache Society toolbox including articles on: chronic migraine medication overuse headache, chronic migraines, prevention of migraines, behavioral and other nonpharmacologic treatments for headache.   Follow Up Instructions:    I discussed the assessment and treatment plan with the patient. The patient was provided an opportunity to ask questions and all were answered. The patient agreed with the plan and demonstrated an understanding of the instructions.   The patient was advised  to call back or seek an in-person evaluation if the symptoms worsen or if the condition fails to improve as  anticipated.   Orders Placed This Encounter  Procedures   MR BRAIN W WO CONTRAST   Meds ordered this encounter  Medications   Fremanezumab-vfrm (AJOVY) 225 MG/1.5ML SOSY    Sig: Inject 225 mg into the skin every 30 (thirty) days.    Dispense:  1 mL    Refill:  11    Patient has copay card; she can have medication for $5 regardless of insurance approval or copay amount.    Cc: Rutherford Guys, MD,    Sarina Ill, MD  [facility] [address] [place], [place] [address]  Phone [phone] Fax [phone]

## 2018-08-19 NOTE — Progress Notes (Signed)
Office Visit Note   Patient: [name]           Date of Birth: 10/31/1985           MRN: [id number] Visit Date: 08/19/2018              Requested by: Rutherford Guys, MD [address] [place],  [place] [address] PCP: Rutherford Guys, MD   Assessment & Plan: Visit Diagnoses:  1. Pain in left hip   2. Acute pain of left shoulder   3. Chronic left shoulder pain     Plan: History of chronic pain cervical spine with possible radiculopathy.  MRI scan was performed demonstrating a shallow disc protrusion slightly more prominent the right at C5-6.  Some potential to affect either C6 nerve root but more likely on the right.  Small central disc bulge at C6-7 without stenosis.  Long discussion regarding the above.  I think it is worth having Dr. Ernestina Patches consider a cervical epidural steroid injection.  Also discussed referral to spine surgeon should she continue to have a problem.  Has had therapy in the past  Also seen today for evaluation of left hip pain.  In August 2019 had an MR arthrogram of the right hip with evidence of a labral tear superiorly.  Had a intra-articular cortisone injection with good relief.  Having similar symptoms on the left.  Will order MRI arthrogram left hip.  Has been experiencing some left shoulder pain that may or may not be related to primary shoulder issue or referred from her cervical spine.  Has a remote history of possible left shoulder subluxation about 3 years ago when her friend "had to push it back in place".  No problems since.  Does have impingement symptoms.  Could have possible labral tear.  We will try a course of physical therapy consider MR arthrogram in time.  Follow-Up Instructions: No follow-ups on file.   Orders:  Orders Placed This Encounter  Procedures  . DL FLUORO GUIDED NEEDLE PLC ASPIRATION / INJECTTION/LOC  . MR Hip Left w/ contrast  . XR Shoulder Left   No orders of the defined types were placed in this encounter.      Procedures: No procedures performed   Clinical Data: No additional findings.   Subjective: Chief Complaint  Patient presents with  . Neck - Pain, Follow-up  Patient presents today for a follow up on her neck. She was last seen three months ago. She had an MRI on 07/22/2018 and is here today for those results. She said that her neck is the same as before. No changes.  MRI scan findings are as above.  Has also been experiencing some left groin pain similar to that which she was experiencing on the right.  Had evidence of a superior labral tear in the right hip with an MR arthrogram last year with a successful intra-articular cortisone injection.  No back pain or referred pain to either lower extremity.  Also wanted me to evaluate her left shoulder with recent onset of pain.  Relates one episode of a sensation of her left shoulder "coming out of place" about 3 years ago.  Her friend hers had to "push it back in place".  No similar issues since that time.  Does experience some pain deep in the shoulder but without popping or clicking.  No loss of motion.  She is not sure if this is referred from her cervical spine or her primary problem with the  left shoulder. some pain referred to the left arm  HPI  Review of Systems   Objective: Vital Signs: BP 139/89   Pulse 78   Ht 5' 3.5" (1.613 m)   Wt 129 lb (58.5 kg)   BMI 22.49 kg/m   Physical Exam Constitutional:      Appearance: She is well-developed.  Eyes:     Pupils: Pupils are equal, round, and reactive to light.  Pulmonary:     Effort: Pulmonary effort is normal.  Skin:    General: Skin is warm and dry.  Neurological:     Mental Status: She is alert and oriented to person, place, and time.  Psychiatric:        Behavior: Behavior normal.     Ortho Exam full range of motion of the cervical spine with some tenderness along the posterior cervical chain.  No nodes identified.  Some referred pain to both scapulae but not to either  upper extremity with active flexion extension of the cervical spine.  Some very minimal discomfort to the left shoulder with rotation of her neck to the left.  Has experienced some pain in the left groin with internal and external rotation of her hip.  Had full range of motion no popping or clicking but mild discomfort at the mid groin.  Left shoulder with some pain at rest which might be referred from her cervical spine.  Also had positive impingement on the extreme of rotation and a positive empty can test.  Good strength.  Skin intact.  No localized areas of tenderness along the subacromial region but did have some discomfort in the area of the glenohumeral joint with rotation.  Specialty Comments:  No specialty comments available.  Imaging: No results found.   PMFS History: Patient Active Problem List   Diagnosis Date Noted  . Chronic migraine without aura without status migrainosus, not intractable 08/19/2018  . Migraine with aura and with status migrainosus, not intractable 08/19/2018  . Pain in left shoulder 08/19/2018  . Pain in left ankle and joints of left foot 05/09/2018  . Pain in left hip 05/09/2018  . Cervicalgia 05/09/2018  . Chronic rhinitis 11/12/2017  . Angioedema 11/12/2017  . Allergic reaction 11/12/2017  . Chronic sinusitis 11/12/2017  . Cervical intraepithelial neoplasia grade 1 10/01/2017  . GAD (generalized anxiety disorder) 09/17/2014  . ACL graft tear ([place]) 08/10/2014  . S/P ACL reconstruction 08/10/2014  . Migraine headache 05/15/2010   Past Medical History:  Diagnosis Date  . ACL graft tear ([facility])    left knee  . Allergy   . Anxiety   . Complication of anesthesia    woke up during surgery  . Depression   . Family history of adverse reaction to anesthesia     mom is difficult to put to sleep  . IUD (intrauterine device) in place   . Knee pain, right   . Migraine headache   . Sleep paralysis     Family History  Problem Relation Age of Onset   . Goiter Father   . Hyperlipidemia Father   . Hypertension Maternal Grandmother   . Stroke Maternal Grandfather   . Cancer Maternal Grandfather   . Stroke Paternal Grandmother   . Migraines Other        on her father's side    Past Surgical History:  Procedure Laterality Date  . ANTERIOR CRUCIATE LIGAMENT REPAIR Left 08/10/2014   Procedure: ANTERIOR CRUCIATE LIGAMENT (ACL) REPAIR;  Surgeon: Sydnee Cabal, MD;  Location:  [facility];  Service: Orthopedics;  Laterality: Left;  . KNEE ARTHROSCOPY Left 08/10/2014   Procedure: ARTHROSCOPY KNEE DEBRIDEMENT ALLOGRAFT ACL REVISION RECONSTRUCTION;  Surgeon: Sydnee Cabal, MD;  Location[facility];  Service: Orthopedics;  Laterality: Left;  . KNEE ARTHROSCOPY W/ ACL RECONSTRUCTION     Social History   Occupational History  . Not on file  Tobacco Use  . Smoking status: Never Smoker  . Smokeless tobacco: Never Used  Substance and Sexual Activity  . Alcohol use: Yes    Alcohol/week: 4.0 standard drinks    Types: 4 Glasses of wine per week  . Drug use: No  . Sexual activity: Yes

## 2018-08-19 NOTE — Telephone Encounter (Signed)
Please advise 

## 2018-08-21 ENCOUNTER — Other Ambulatory Visit: Payer: Self-pay | Admitting: Family Medicine

## 2018-08-21 ENCOUNTER — Telehealth: Payer: Self-pay | Admitting: Neurology

## 2018-08-21 DIAGNOSIS — M542 Cervicalgia: Secondary | ICD-10-CM

## 2018-08-21 NOTE — Telephone Encounter (Signed)
Forwarding medication refill to PCP for review. 

## 2018-08-21 NOTE — Telephone Encounter (Signed)
LVM for pt to call back about scheduling mri  BC[employer] Auth: [id number] (exp. 08/20/18 to 10/04/18)

## 2018-08-28 ENCOUNTER — Telehealth: Payer: Self-pay | Admitting: *Deleted

## 2018-08-28 NOTE — Telephone Encounter (Signed)
Called pharmacy and obtained rx benefit information.   [facility] RXBIN: [id number] PCN: [id number] RxGroup: [id number] ID: [id number]  Attempted to complete PA on CMM for Ajovy. KEY: [id number]. Received this message: Please advise the dispensing pharmacy to contact the [facility] at [phone] for assistance.   I called [facility] back and spoke with Alena, pharmacist. She will reach out to the pharmacy help desk to see if this can be resolved.

## 2018-09-01 ENCOUNTER — Ambulatory Visit: Payer: 59

## 2018-09-04 NOTE — Telephone Encounter (Signed)
Paperwork has been signed by MD and faxed back to [facility]. Confirmation fax has been received.

## 2018-09-08 ENCOUNTER — Encounter: Payer: 59 | Admitting: Physical Medicine and Rehabilitation

## 2018-09-11 ENCOUNTER — Telehealth: Payer: Self-pay | Admitting: Gastroenterology

## 2018-09-11 NOTE — Telephone Encounter (Signed)
Spoke with pt. Advised her of Ajovy denial. Discussed she can download and activate the savings card from http://santos.net/. She verbalized appreciation and understanding. She will call us back if this doesn't work.

## 2018-09-11 NOTE — Telephone Encounter (Signed)
Received denial of Ajovy from [facility] d/t not meeting the requirements of tried/failed medication for 8 weeks (divalproex, topiramate, valproate sodium, beta-adrenergic blocking agents such as metoprolol, propranolol, timolol, atenolol, nadolol; antidepressants such as amitriptyline or venlafaxine.  If we should choose to appeal, fax to [phone].  Pt should be able to use the savings card for $5/month copay.

## 2018-09-11 NOTE — Telephone Encounter (Signed)

## 2018-09-12 ENCOUNTER — Encounter: Payer: 59 | Admitting: Gastroenterology

## 2018-09-22 ENCOUNTER — Ambulatory Visit
Admission: RE | Admit: 2018-09-22 | Discharge: 2018-09-22 | Disposition: A | Discharge status: Home / Self Care | Payer: 59 | Source: Ambulatory Visit | Attending: Neurology | Admitting: Neurology

## 2018-09-22 ENCOUNTER — Other Ambulatory Visit: Payer: Self-pay

## 2018-09-22 DIAGNOSIS — H547 Unspecified visual loss: Secondary | ICD-10-CM

## 2018-09-22 DIAGNOSIS — R51 Headache with orthostatic component, not elsewhere classified: Secondary | ICD-10-CM

## 2018-09-22 DIAGNOSIS — R519 Headache, unspecified: Secondary | ICD-10-CM

## 2018-09-22 DIAGNOSIS — G4484 Primary exertional headache: Secondary | ICD-10-CM

## 2018-09-22 MED ORDER — GADOBENATE DIMEGLUMINE 529 MG/ML IV SOLN
12.0000 mL | Freq: Once | INTRAVENOUS | Status: AC | PRN
  Administered 2018-09-22: 12 mL via INTRAVENOUS

## 2018-09-23 ENCOUNTER — Telehealth: Payer: Self-pay

## 2018-09-23 NOTE — Telephone Encounter (Signed)
-----   Message from Melvenia Beam, MD sent at 09/22/2018  4:02 PM EDT ----- MRI of the brain is normal for age thanks

## 2018-09-23 NOTE — Telephone Encounter (Signed)
Unable to get in contact with the patient. I left a voicmail (DPR verified) with her MRI results. Office number was provided in case she has any other questions or concerns.

## 2018-09-30 ENCOUNTER — Telehealth: Payer: Self-pay | Admitting: Gastroenterology

## 2018-09-30 NOTE — Telephone Encounter (Signed)

## 2018-09-30 NOTE — Telephone Encounter (Signed)
I returned pt call and she states all her questions were answered previously by another called from the hospital.

## 2018-10-01 ENCOUNTER — Encounter: Payer: 59 | Admitting: Gastroenterology

## 2018-10-07 ENCOUNTER — Telehealth: Payer: Self-pay | Admitting: Gastroenterology

## 2018-10-07 NOTE — Telephone Encounter (Signed)
Left message on vmail regarding Covid-19 screening questions. Covid-19 Screening Questions:   Do you now or have you had a fever in the last 14 days?    Do you have any respiratory symptoms of shortness of breath or cough now or in the last 14 days?    Do you have any family members or close contacts with diagnosed  or suspected Covid-19 in the past 14 days?    Have you been tested for Covid-19 and found to be positive?

## 2018-10-08 ENCOUNTER — Encounter: Payer: Self-pay | Admitting: Gastroenterology

## 2018-10-08 ENCOUNTER — Ambulatory Visit: Payer: 59 | Admitting: Gastroenterology

## 2018-10-08 ENCOUNTER — Other Ambulatory Visit: Payer: Self-pay

## 2018-10-08 VITALS — BP 125/82 | HR 96 | Temp 98.4°F | Ht 63.5 in | Wt 128.0 lb

## 2018-10-08 DIAGNOSIS — K625 Hemorrhage of anus and rectum: Secondary | ICD-10-CM

## 2018-10-08 MED ORDER — FLEET ENEMA 7-19 GM/118ML RE ENEM
1.0000 | ENEMA | Freq: Once | RECTAL | Status: AC
  Administered 2018-10-08: 1 via RECTAL

## 2018-10-10 ENCOUNTER — Ambulatory Visit
Admission: RE | Admit: 2018-10-10 | Discharge: 2018-10-10 | Disposition: A | Discharge status: Home / Self Care | Payer: 59 | Source: Ambulatory Visit | Attending: Orthopaedic Surgery | Admitting: Orthopaedic Surgery

## 2018-10-10 ENCOUNTER — Other Ambulatory Visit: Payer: Self-pay

## 2018-10-10 DIAGNOSIS — M25552 Pain in left hip: Secondary | ICD-10-CM

## 2018-10-10 MED ORDER — IOPAMIDOL (ISOVUE-M 200) INJECTION 41%
12.0000 mL | Freq: Once | INTRAMUSCULAR | Status: AC
  Administered 2018-10-10: 12 mL via INTRA_ARTICULAR

## 2018-10-13 ENCOUNTER — Other Ambulatory Visit: Payer: Self-pay

## 2018-10-17 ENCOUNTER — Other Ambulatory Visit: Payer: Self-pay | Admitting: Family Medicine

## 2018-10-17 DIAGNOSIS — F411 Generalized anxiety disorder: Secondary | ICD-10-CM

## 2018-10-17 NOTE — Telephone Encounter (Signed)
Requested medication (s) are due for refill today:yes  Requested medication (s) are on the active medication list: yes  Last refill: 08/26/2018  Future visit scheduled: No  Notes to clinic:  Med not delegated    Requested Prescriptions  Pending Prescriptions Disp Refills   clonazePAM (KLONOPIN) 0.5 MG tablet [Pharmacy Med Name: CLONAZEPAM 0.5MG  TABLETS] 30 tablet     Sig: TAKE 1 TABLET(0.5 MG) BY MOUTH DAILY AS NEEDED FOR ANXIETY     Not Delegated - Psychiatry:  Anxiolytics/Hypnotics Failed - 10/17/2018  2:25 PM      Failed - This refill cannot be delegated      Failed - Urine Drug Screen completed in last 360 days.      Passed - Valid encounter within last 6 months    Recent Outpatient Visits          5 months ago External hemorrhoid   [facility], Lilia Argue, MD   9 months ago Thrombocytosis [facility])   [facility], Lilia Argue, MD   1 year ago Cervical pain (neck)   [facility], Lilia Argue, MD   1 year ago GAD (generalized anxiety disorder)   [facility], Lilia Argue, MD   1 year ago GAD (generalized anxiety disorder)   [facility] [place], [place], [place]

## 2018-10-17 NOTE — Telephone Encounter (Signed)
pmp reviewd, appropriate meds refilled

## 2018-10-17 NOTE — Telephone Encounter (Signed)
Please advise on refill  Controlled  Last seen 07/17/18 Tel-med for external hemrroids and bright red blood in rectum  Last Filled 05/24/18 #30 with 2 refills

## 2018-10-22 ENCOUNTER — Ambulatory Visit (AMBULATORY_SURGERY_CENTER): Payer: 59 | Admitting: Gastroenterology

## 2018-10-22 ENCOUNTER — Telehealth: Payer: Self-pay | Admitting: Gastroenterology

## 2018-10-22 ENCOUNTER — Other Ambulatory Visit: Payer: Self-pay

## 2018-10-22 ENCOUNTER — Other Ambulatory Visit: Payer: Self-pay | Admitting: *Deleted

## 2018-10-22 ENCOUNTER — Encounter: Payer: Self-pay | Admitting: Gastroenterology

## 2018-10-22 ENCOUNTER — Encounter: Payer: 59 | Admitting: Gastroenterology

## 2018-10-22 ENCOUNTER — Telehealth: Payer: Self-pay | Admitting: *Deleted

## 2018-10-22 ENCOUNTER — Other Ambulatory Visit (INDEPENDENT_AMBULATORY_CARE_PROVIDER_SITE_OTHER): Payer: 59

## 2018-10-22 VITALS — BP 92/62 | HR 75 | Temp 98.6°F | Resp 16 | Ht 63.5 in | Wt 129.0 lb

## 2018-10-22 DIAGNOSIS — K6289 Other specified diseases of anus and rectum: Secondary | ICD-10-CM

## 2018-10-22 DIAGNOSIS — K5010 Crohn's disease of large intestine without complications: Secondary | ICD-10-CM

## 2018-10-22 DIAGNOSIS — K51219 Ulcerative (chronic) proctitis with unspecified complications: Secondary | ICD-10-CM

## 2018-10-22 DIAGNOSIS — K625 Hemorrhage of anus and rectum: Secondary | ICD-10-CM

## 2018-10-22 LAB — CBC WITH DIFFERENTIAL/PLATELET
Basophils Absolute: 0.1 10*3/uL (ref 0.0–0.1)
Basophils Relative: 1 % (ref 0.0–3.0)
Eosinophils Absolute: 0.2 10*3/uL (ref 0.0–0.7)
Eosinophils Relative: 2.2 % (ref 0.0–5.0)
HCT: 39 % (ref 36.0–46.0)
Hemoglobin: 13.2 g/dL (ref 12.0–15.0)
Lymphocytes Relative: 14.7 % (ref 12.0–46.0)
Lymphs Abs: 1.6 10*3/uL (ref 0.7–4.0)
MCHC: 34 g/dL (ref 30.0–36.0)
MCV: 92.9 fl (ref 78.0–100.0)
Monocytes Absolute: 0.8 10*3/uL (ref 0.1–1.0)
Monocytes Relative: 7.1 % (ref 3.0–12.0)
Neutro Abs: 8 10*3/uL — ABNORMAL HIGH (ref 1.4–7.7)
Neutrophils Relative %: 75 % (ref 43.0–77.0)
Platelets: 338 10*3/uL (ref 150.0–400.0)
RBC: 4.2 Mil/uL (ref 3.87–5.11)
RDW: 11.9 % (ref 11.5–15.5)
WBC: 10.7 10*3/uL — ABNORMAL HIGH (ref 4.0–10.5)

## 2018-10-22 LAB — SEDIMENTATION RATE: Sed Rate: 5 mm/hr (ref 0–20)

## 2018-10-22 LAB — FERRITIN: Ferritin: 45.7 ng/mL (ref 10.0–291.0)

## 2018-10-22 LAB — IRON: Iron: 97 ug/dL (ref 42–145)

## 2018-10-22 LAB — C-REACTIVE PROTEIN: CRP: <1 mg/dL (ref 0.5–20.0)

## 2018-10-22 MED ORDER — MESALAMINE 4 G RE ENEM: 4.0000 g | ENEMA | Freq: Two times a day (BID) | RECTAL | 0 refills | Status: DC

## 2018-10-22 MED ORDER — SODIUM CHLORIDE 0.9 % IV SOLN: 500.0000 mL | Freq: Once | INTRAVENOUS | Status: DC

## 2018-10-22 NOTE — Progress Notes (Signed)
Called to room to assist during endoscopic procedure.  Patient ID and intended procedure confirmed with present staff. Received instructions for my participation in the procedure from the performing physician.  

## 2018-10-22 NOTE — Patient Instructions (Signed)
Continue present medications. Use Rowasa suppositories  1  per rectum twice daily for 8 weeks. Await pathology results. Avoid all NSAIDS. Follow-up appointment in 2-4 weeks with Dr. Tarri Glenn.        YOU HAD AN ENDOSCOPIC PROCEDURE TODAY AT [facility]:   Refer to the procedure report that was given to you for any specific questions about what was found during the examination.  If the procedure report does not answer your questions, please call your gastroenterologist to clarify.  If you requested that your care partner not be given the details of your procedure findings, then the procedure report has been included in a sealed envelope for you to review at your convenience later.  YOU SHOULD EXPECT: Some feelings of bloating in the abdomen. Passage of more gas than usual.  Walking can help get rid of the air that was put into your GI tract during the procedure and reduce the bloating. If you had a lower endoscopy (such as a colonoscopy or flexible sigmoidoscopy) you may notice spotting of blood in your stool or on the toilet paper. If you underwent a bowel prep for your procedure, you may not have a normal bowel movement for a few days.  Please Note:  You might notice some irritation and congestion in your nose or some drainage.  This is from the oxygen used during your procedure.  There is no need for concern and it should clear up in a day or so.  SYMPTOMS TO REPORT IMMEDIATELY:   Following lower endoscopy (colonoscopy or flexible sigmoidoscopy):  Excessive amounts of blood in the stool  Significant tenderness or worsening of abdominal pains  Swelling of the abdomen that is new, acute  Fever of 100F or higher    For urgent or emergent issues, a gastroenterologist can be reached at any hour by calling [phone].   DIET:  We do recommend a small meal at first, but then you may proceed to your regular diet.  Drink plenty of fluids but you should avoid alcoholic  beverages for 24 hours.  ACTIVITY:  You should plan to take it easy for the rest of today and you should NOT DRIVE or use heavy machinery until tomorrow (because of the sedation medicines used during the test).    FOLLOW UP: Our staff will call the number listed on your records 48-72 hours following your procedure to check on you and address any questions or concerns that you may have regarding the information given to you following your procedure. If we do not reach you, we will leave a message.  We will attempt to reach you two times.  During this call, we will ask if you have developed any symptoms of COVID 19. If you develop any symptoms (ie: fever, flu-like symptoms, shortness of breath, cough etc.) before then, please call [phone].  If you test positive for Covid 19 in the 2 weeks post procedure, please call and report this information to [place].    If any biopsies were taken you will be contacted by phone or by letter within the next 1-3 weeks.  Please call us at [phone] if you have not heard about the biopsies in 3 weeks.    SIGNATURES/CONFIDENTIALITY: You and/or your care partner have signed paperwork which will be entered into your electronic medical record.  These signatures attest to the fact that that the information above on your After Visit Summary has been reviewed and is understood.  Full responsibility of the  confidentiality of this discharge information lies with you and/or your care-partner.

## 2018-10-22 NOTE — Progress Notes (Signed)
Report to PACU, RN, vss, BBS= Clear.  

## 2018-10-22 NOTE — Op Note (Addendum)
[facility] Patient Name: [name] Procedure Date: 10/22/2018 1:36 PM MRN: [id number] Endoscopist: Thornton Park MD, MD Age: 33 Referring MD:  Date of Birth: 21-Mar-1985 Gender: Female Account #: [id number] Procedure:                Colonoscopy Indications:              Rectal bleeding, Change in bowel habits Medicines:                See the Anesthesia note for documentation of the                            administered medications Procedure:                Pre-Anesthesia Assessment:                           - Prior to the procedure, a History and Physical                            was performed, and patient medications and                            allergies were reviewed. The patient's tolerance of                            previous anesthesia was also reviewed. The risks                            and benefits of the procedure and the sedation                            options and risks were discussed with the patient.                            All questions were answered, and informed consent                            was obtained. Prior Anticoagulants: The patient has                            taken no previous anticoagulant or antiplatelet                            agents. ASA Grade Assessment: II - A patient with                            mild systemic disease. After reviewing the risks                            and benefits, the patient was deemed in                            satisfactory condition to undergo the procedure.  After obtaining informed consent, the colonoscope                            was passed under direct vision. Throughout the                            procedure, the patient's blood pressure, pulse, and                            oxygen saturations were monitored continuously. The                            Colonoscope was introduced through the anus and                            advanced to the the  terminal ileum, with                            identification of the appendiceal orifice and IC                            valve. A second forward view of the right colon was                            performed. The colonoscopy was performed without                            difficulty. The patient tolerated the procedure                            well. The quality of the bowel preparation was                            adequate to identify polyps 6 mm and larger in                            size. The terminal ileum, ileocecal valve,                            appendiceal orifice, and rectum were photographed. Scope In: 1:43:39 PM Scope Out: 2:00:07 PM Scope Withdrawal Time: 0 hours 12 minutes 35 seconds  Total Procedure Duration: 0 hours 16 minutes 28 seconds  Findings:                 The perianal and digital rectal examinations were                            normal.                           Diffuse mild inflammation characterized by                            erythema, friability and granularity was found in  the rectum extending to 16cm from the anal verge.                            Biopsies were taken with a cold forceps for                            histology. Estimated blood loss was minimal.                           The remainder of the examined colon appeared                            normal. Biopsies were taken from the left colon,                            transverse colon, and right colon with a cold                            forceps for histology. Estimated blood loss was                            minimal.                           The terminal ileum appeared normal. Biopsies were                            taken with a cold forceps for histology. Estimated                            blood loss was minimal.                           The exam was otherwise without abnormality on                            direct and retroflexion  views. Complications:            No immediate complications. Estimated blood loss:                            Minimal. Estimated Blood Loss:     Estimated blood loss was minimal. Impression:               - Diffuse mild inflammation was found in the rectum                            likely due to proctitis ulcerative colitis.                            Biopsied.                           - The entire examined colon is normal. Biopsied.                           -  The examined portion of the ileum was normal.                            Biopsied.                           - The examination was otherwise normal on direct                            and retroflexion views.                           - The quality of this prep was inadequate to                            evaluate for small polyps and this procedure should                            not be used as a substitute for a screening                            colonoscopy. Recommendation:           - Patient has a contact number available for                            emergencies. The signs and symptoms of potential                            delayed complications were discussed with the                            patient. Return to normal activities tomorrow.                            Written discharge instructions were provided to the                            patient.                           - Resume previous diet today.                           - Continue present medications.                           - Use Rowasa suppositories 1 per rectum BID for 8                            weeks.                           - Await pathology results.                           - Avoid all NSAIDs.                           -  Please go to the lab for the following studies:                            fecal calprotectin, GI pathogen panel. ESR, CRP,                            CBC, Ferritin, and iron                           - Follow-up  appointment with me in 2-4 weeks.                           - Use a different colon purgative for future                            colonoscopies. Thornton Park MD, MD 10/22/2018 2:08:01 PM This report has been signed electronically.

## 2018-10-22 NOTE — Telephone Encounter (Signed)
Returned pts call.  Explained to her that procedure was cancelled because she did not complete previsit.  She had completed the plenvu sample she was given that day following the instructions that came with it.  She states that she is completely cleaned out with only clear liquid stools.  I encouraged her to drink clear liquids until 1030. Spoke with Dr. Tarri Glenn and we will schedule her at 130 today.

## 2018-10-22 NOTE — Addendum Note (Signed)
Addended by: Levonne Spiller on: 10/22/2018 11:29 AM   Modules accepted: Orders

## 2018-10-22 NOTE — Progress Notes (Signed)
[name] - temp [name] - VS   

## 2018-10-22 NOTE — Telephone Encounter (Signed)
Order for Rowasa enemas BID x 8 weeks placed in Epic per Dr. Tarri Glenn verbal order by this RN.  Orders for fecal calprotectin, GI path panel, ESR, CRP, CBC, Ferritin, Iron already placed in Epic by Marlon Pel CMA.

## 2018-10-22 NOTE — Progress Notes (Signed)
Per Mliss Sax in [facility] Dr. Tarri Glenn wants lab work done with Dx of ulcerative proctitis.

## 2018-10-24 ENCOUNTER — Telehealth: Payer: Self-pay

## 2018-10-24 NOTE — Telephone Encounter (Signed)
  Follow up Call-  Call back number 10/22/2018 10/08/2018  Post procedure Call Back phone  # [phone] cell [phone]  Permission to leave phone message Yes Yes  Some recent data might be hidden     Patient questions:  Do you have a fever, pain , or abdominal swelling? Yes.   Pain Score  2 *  Have you tolerated food without any problems? Yes.    Have you been able to return to your normal activities? Yes.    Do you have any questions about your discharge instructions: Diet   No. Medications  No. Follow up visit  No.  Do you have questions or concerns about your Care? No.  Actions: * If pain score is 4 or above: No action needed, pain <4.  Pt said she feels bloated and rated abdominal discomfort 2/10.  She is still having the same amount of rectal bleeding.  Pt said she has not had a real BM yet.  I advised her hopefully, when she has a BM it will help with the bloating and abdominal discomfort.  Pt has not started the ROWASA supp yet either.  She is picking them up today and will begin with them.  If pt's sx are getting worse or no improvement, she is to call us back. Maw   1. Have you developed a fever since your procedure? no  2.   Have you had an respiratory symptoms (SOB or cough) since your procedure? no  3.   Have you tested positive for COVID 19 since your procedure no  4.   Have you had any family members/close contacts diagnosed with the COVID 19 since your procedure?  no   If yes to any of these questions please route to Joylene John, RN and Alphonsa Gin, Therapist, sports.

## 2018-10-28 ENCOUNTER — Encounter: Payer: Self-pay | Admitting: *Deleted

## 2018-10-30 ENCOUNTER — Encounter: Payer: Self-pay | Admitting: *Deleted

## 2018-11-11 ENCOUNTER — Other Ambulatory Visit: Payer: 59

## 2018-11-11 DIAGNOSIS — K51219 Ulcerative (chronic) proctitis with unspecified complications: Secondary | ICD-10-CM

## 2018-11-12 LAB — GASTROINTESTINAL PATHOGEN PANEL PCR
C. difficile Tox A/B, PCR: NOT DETECTED
Campylobacter, PCR: NOT DETECTED
Cryptosporidium, PCR: NOT DETECTED
E coli (ETEC) LT/ST PCR: NOT DETECTED
E coli (STEC) stx1/stx2, PCR: NOT DETECTED
E coli 0157, PCR: NOT DETECTED
Giardia lamblia, PCR: NOT DETECTED
Norovirus, PCR: NOT DETECTED
Rotavirus A, PCR: NOT DETECTED
Salmonella, PCR: NOT DETECTED
Shigella, PCR: NOT DETECTED

## 2018-11-18 LAB — CALPROTECTIN, FECAL: Calprotectin, Fecal: 340 ug/g — ABNORMAL HIGH (ref 0–120)

## 2018-11-28 ENCOUNTER — Ambulatory Visit (INDEPENDENT_AMBULATORY_CARE_PROVIDER_SITE_OTHER): Payer: 59 | Admitting: Gastroenterology

## 2018-11-28 ENCOUNTER — Encounter: Payer: Self-pay | Admitting: Gastroenterology

## 2018-11-28 VITALS — BP 106/68 | HR 84 | Temp 98.1°F | Ht 63.5 in | Wt 134.2 lb

## 2018-11-28 DIAGNOSIS — K51211 Ulcerative (chronic) proctitis with rectal bleeding: Secondary | ICD-10-CM | POA: Diagnosis not present

## 2018-11-28 MED ORDER — MESALAMINE 4 G RE ENEM: 4.0000 g | ENEMA | Freq: Two times a day (BID) | RECTAL | 0 refills | Status: DC

## 2018-11-28 NOTE — Patient Instructions (Signed)
I have recommended some labs today.  I recommend that you use sunscreen and sun-protective clothing whenever you are outside.  Please see your gynecologist annually for cervical cancer screening  Keeping your bones healthy is important. I recommend that you take daily calcium and Vitamin D supplements. Weight bearing exercise is also good for your bones.   I recommend that you go to the [employer] website for additional information about achieving good health.   Let's plan to check in 6 weeks from now.  I would also like to repeat your fecal calprotectin one week before your next visit.   Please call with any questions or concerns prior to that time.

## 2018-11-28 NOTE — Progress Notes (Signed)
[place]   Referring Provider: Rutherford Guys, MD Primary Care Physician:  Rutherford Guys, MD  Reason for Consultation:  Rectal bleeding   IMPRESSION:  Distal ulcerative colitis presenting with rectal bleeding    - diagnosed on colonoscopy 10/22/18    - fecal calprotectin 340 11/11/18 No known family history of colon cancer or polyps  New diagnosis of ulcerative proctitis. Diagnosis, natural history, and treatment reviewed.  I recommend mesalamine suppositories 1 gram daily. If not improving after two weeks, increase to 1 gram PR BID for at least 4 weeks and until remission. When remission is achieved, will reduce the dose to daily.  Will use high dose, once daily oral 5-ASA if local therapy is not tolerated.  I recommend avoiding all  nonsteroidal anti-inflammatory drugs should be avoided due to their potential to exacerbate inflammatory bowel disease.  Annual seasonal flu vaccine recommended.    It is unclear if there is an increased risk of colon cancer in the setting of ulcerative proctitis. I recommend initial screening colonoscopy at eight years after disease onset in the setting of isolated proctitis.     PLAN: Vit D, B6, hepatic function  Fecal protectin one week before next office week Rowasa 4g PR BID x 8 weeks Seasonal flu vaccine recommended Return in 6 weeks, or earlier as needed Encouraged Crohn's and Binger website for further information   HPI: [name] is a 33 y.o. female referred by Dr. Pamella Pert for further evaluation of rectal bleeding.  She was seen in initial consultation 08/01/2018.  She had a colonoscopy 10/22/2018.  She returns in follow-up.  The interval history is obtained through the patient and her electronic health record. She has depression, migraines, DDD of neck, chronic right hip painfrom labral tear, angioedema, and chronic rhinitis.   Developed rectal bleeding and altered bowel habits with associated abdominal discomfort, bloating,  and constipation in January 2020. Diagnosed with external hemorrhoids. Anusol HC suppositories, external hydrocortisone rectal cream, and sitz bath's made the hemorrhoids smaller but she continued to have bright red blood per rectum.  Disrupting sex life. Feels a pressure and her husband hits this area during sex. No vaginal discharge or vaginal bleeding.   Colonoscopy 10/22/18: - The perianal and digital rectal examinations were normal. - Diffuse mild inflammation characterized by erythema, friability and granularity was found in the rectum extending to 16cm from the anal verge. Biopsies were taken with a cold forceps for histology. Estimated blood loss was minimal. - The remainder of the examined colon appeared normal. Biopsies were taken from the left colon, transverse colon, and right colon with a cold forceps for histology. Estimated blood loss was minimal. - The terminal ileum appeared normal. Biopsies were taken with a cold forceps for histology. Estimated blood loss was minimal. - The exam was otherwise without abnormality on direct and retroflexion views.  1. Surgical [P], small bowel, terminal ileum - ILEAL MUCOSA WITH BENIGN LYMPHOID AGGREGATE. - NO ACTIVE INFLAMMATION, CHRONIC CHANGES OR GRANULOMAS. 2. Surgical [P], right colon BXs - COLONIC MUCOSA WITH BENIGN LYMPHOID AGGREGATES. - NEGATIVE FOR DYSPLASIA. - NO ACTIVE INFLAMMATION. 3. Surgical [P], colon, transverse - COLONIC MUCOSA WITH NO SIGNIFICANT PATHOLOGIC CHANGES. - NEGATIVE FOR DYSPLASIA. - NO ACTIVE INFLAMMATION. 4. Surgical [P], left colon BXs - COLONIC MUCOSA WITH NO SIGNIFICANT PATHOLOGIC CHANGES. - NEGATIVE FOR DYSPLASIA. - NO ACTIVE INFLAMMATION. 5. Surgical [P], colon, rectum - MODERATELY ACTIVE CHRONIC COLITIS CONSISTENT WITH INFLAMMATORY BOWEL DISEASE. - NEGATIVE FOR DYSPLASIA.  Labs 04/03/17: ALT 18,  AST 18, alk phos 96, TB 0.3 Labs 10/22/18: iron 97, ferritin 45.7, WBC 10.7, hgb 13.2, platelets 338 Labs  11/11/18: fecal calprotectin 340, GI pathogen panel negative  Using Rowasa for 8 days. Did not have more than that.  Not feeling much different. Symptoms are essentially unchanged. No extra-GI manifestations of IBD.   Health Maintenance:  - DEXA: N/A - Vaccinations:      - Annual Flu Vaccine  recommend, has not had one the last 2 years      - Pneumococcal Vaccine: N/A  - Micronutrient eval:       - Annual Vit D, B6, iron panel: Ordered today - Annual Pap (if immunosuppressed): By Dr. Philis Pique 02/11/2018 - Surveillance labs for immunomodulators: N/A - Annual depression screening: None  Past Medical History:  Diagnosis Date  . ACL graft tear (HCC)    left knee  . Allergy   . Anxiety   . Complication of anesthesia    woke up during surgery  . Depression   . Family history of adverse reaction to anesthesia     mom is difficult to put to sleep  . IUD (intrauterine device) in place   . Knee pain, right   . Migraine headache   . Sleep paralysis     Past Surgical History:  Procedure Laterality Date  . ANTERIOR CRUCIATE LIGAMENT REPAIR Left 08/10/2014   Procedure: ANTERIOR CRUCIATE LIGAMENT (ACL) REPAIR;  Surgeon: Sydnee Cabal, MD;  Location: [facility];  Service: Orthopedics;  Laterality: Left;  . KNEE ARTHROSCOPY Left 08/10/2014   Procedure: ARTHROSCOPY KNEE DEBRIDEMENT ALLOGRAFT ACL REVISION RECONSTRUCTION;  Surgeon: Sydnee Cabal, MD;  Location: [facility];  Service: Orthopedics;  Laterality: Left;  . KNEE ARTHROSCOPY W/ ACL RECONSTRUCTION    . widom tooth extraction     only had 1 wisdom tooth     Current Outpatient Medications  Medication Sig Dispense Refill  . azelastine (ASTELIN) 0.1 % nasal spray Place 2 sprays into both nostrils 2 (two) times daily. 30 mL 5  . clonazePAM (KLONOPIN) 0.5 MG tablet TAKE 1 TABLET(0.5 MG) BY MOUTH DAILY AS NEEDED FOR ANXIETY 30 tablet 2  . cyclobenzaprine (FLEXERIL) 10 MG tablet TAKE 1 TABLET(10 MG) BY MOUTH  AT BEDTIME 30 tablet 3  . DULoxetine (CYMBALTA) 60 MG capsule TAKE 1 CAPSULE(60 MG) BY MOUTH DAILY 30 capsule 5  . Fremanezumab-vfrm (AJOVY) 225 MG/1.5ML SOSY Inject 225 mg into the skin every 30 (thirty) days. (Patient not taking: Reported on 10/08/2018) 1 mL 11  . hydrocortisone (ANUSOL-HC) 2.5 % rectal cream Place 1 application rectally 2 (two) times daily. 30 g 2  . mesalamine (ROWASA) 4 g enema Place 60 mLs (4 g total) rectally 2 (two) times daily. 3600 mL 0  . SUMAtriptan (IMITREX) 100 MG tablet take 1 tablet by mouth immediately may repeat after 2 hours if needed for MIGRAINE 25 tablet 5  . SUMAtriptan 6 MG/0.5ML SOAJ Inject 0.5 mLs into the skin daily as needed. (Patient not taking: Reported on 10/08/2018) 0.5 mL 3   No current facility-administered medications for this visit.     Allergies as of 11/28/2018 - Review Complete 10/22/2018  Allergen Reaction Noted  . Molds & smuts Swelling 05/13/2017    Family History  Problem Relation Age of Onset  . Goiter Father   . Hyperlipidemia Father   . Hypertension Maternal Grandmother   . Stroke Maternal Grandfather   . Cancer Maternal Grandfather   . Stroke Paternal Grandmother   .  Migraines Other        on her father's side  . Colon cancer Neg Hx   . Esophageal cancer Neg Hx   . Rectal cancer Neg Hx   . Stomach cancer Neg Hx     Social History   Socioeconomic History  . Marital status: Married    Spouse name: Cristie Hem   . Number of children: 0  . Years of education: Not on file  . Highest education level: Not on file  Occupational History  . Not on file  Social Needs  . Financial resource strain: Not on file  . Food insecurity    Worry: Not on file    Inability: Not on file  . Transportation needs    Medical: Not on file    Non-medical: Not on file  Tobacco Use  . Smoking status: Never Smoker  . Smokeless tobacco: Never Used  Substance and Sexual Activity  . Alcohol use: Yes    Alcohol/week: 4.0 standard drinks     Types: 4 Glasses of wine per week  . Drug use: No  . Sexual activity: Yes    Birth control/protection: I.U.D.  Lifestyle  . Physical activity    Days per week: Not on file    Minutes per session: Not on file  . Stress: Not on file  Relationships  . Social Herbalist on phone: Not on file    Gets together: Not on file    Attends religious service: Not on file    Active member of club or organization: Not on file    Attends meetings of clubs or organizations: Not on file    Relationship status: Not on file  . Intimate partner violence    Fear of current or ex partner: Not on file    Emotionally abused: Not on file    Physically abused: Not on file    Forced sexual activity: Not on file  Other Topics Concern  . Not on file  Social History Narrative   Lives at home with her husband and pets   Right handed   Caffeine: 1-2 cups daily    Physical Exam: General:   Alert,  well-nourished, pleasant and cooperative in NAD.  Eyes are open. Head:  Normocephalic and atraumatic. Eyes:  Sclera clear, no icterus.   Conjunctiva pink. Ears:  Normal auditory acuity. Nose:  No deformity, discharge,  or lesions. Mouth:  No deformity or lesions.   Neck:  Supple; no masses or thyromegaly. Lungs:  Clear throughout to auscultation.   No wheezes. Heart:  Regular rate and rhythm; no murmurs. Abdomen:  Soft,nontender, nondistended, normal bowel sounds, no rebound or guarding. No hepatosplenomegaly.  No obvious ascites.  No abdominal wall hernias.  No succession splash. Rectal:  Deferred  Msk:  Symmetrical. No boney deformities LAD: No inguinal or umbilical LAD Extremities:  No clubbing or edema. Neurologic:  Alert and  oriented x4;  grossly nonfocal Skin:  Intact without significant lesions or rashes. Psych:  Alert and cooperative. Normal mood and affect.    Dymin Dingledine L. Tarri Glenn, MD, MPH [facility] 11/28/2018, 3:52 PM

## 2018-11-30 ENCOUNTER — Other Ambulatory Visit: Payer: Self-pay | Admitting: Gastroenterology

## 2019-01-06 ENCOUNTER — Telehealth: Payer: Self-pay | Admitting: Orthopaedic Surgery

## 2019-01-06 NOTE — Telephone Encounter (Signed)
Received vm from patient requesting records be sent to outside office. IC,lmvm advised pt she need to complete and sign records release form.

## 2019-01-16 ENCOUNTER — Other Ambulatory Visit: Payer: Self-pay | Admitting: Gastroenterology

## 2019-01-19 ENCOUNTER — Ambulatory Visit: Payer: 59 | Admitting: Gastroenterology

## 2019-02-16 ENCOUNTER — Other Ambulatory Visit: Payer: Self-pay | Admitting: Family Medicine

## 2019-02-16 MED ORDER — DULOXETINE HCL 60 MG PO CPEP: ORAL_CAPSULE | ORAL | 0 refills | Status: DC

## 2019-02-16 NOTE — Telephone Encounter (Signed)
Medication Refill - Medication: DULoxetine (CYMBALTA) 60 MG capsule    Preferred Pharmacy (with phone number or street name):  [id number] - [place], [place] Phone:  [phone]  Fax:  [phone]       Agent: Please be advised that RX refills may take up to 3 business days. We ask that you follow-up with your pharmacy.

## 2019-03-07 ENCOUNTER — Other Ambulatory Visit: Payer: Self-pay | Admitting: Family Medicine

## 2019-03-07 DIAGNOSIS — F411 Generalized anxiety disorder: Secondary | ICD-10-CM

## 2019-03-07 DIAGNOSIS — G43109 Migraine with aura, not intractable, without status migrainosus: Secondary | ICD-10-CM

## 2019-03-07 NOTE — Telephone Encounter (Signed)
Requested medication (s) are due for refill today: yes  Requested medication (s) are on the active medication list:  yes  Last refill:  02/16/2019  Future visit scheduled:no  Notes to clinic:  no valid encounter within last 6 months Review for refill   Requested Prescriptions  Pending Prescriptions Disp Refills   DULoxetine (CYMBALTA) 60 MG capsule [Pharmacy Med Name: DULOXETINE DR 60MG CAPSULES] 30 capsule 0    Sig: TAKE 1 CAPSULE(60 MG) BY MOUTH DAILY      Psychiatry: Antidepressants - SNRI Failed - 03/07/2019  3:27 PM      Failed - Valid encounter within last 6 months    Recent Outpatient Visits           7 months ago Bright red blood per rectum   [facility], Lilia Argue, MD   9 months ago External hemorrhoid   [facility], Lilia Argue, MD   10 months ago External hemorrhoid   [facility], Lilia Argue, MD   1 year ago Thrombocytosis [facility])   [facility], Lilia Argue, MD   1 year ago Cervical pain (neck)   [facility], Lilia Argue, MD              Passed - Last BP in normal range    BP Readings from Last 1 Encounters:  11/28/18 106/68            clonazePAM (KLONOPIN) 0.5 MG tablet [Pharmacy Med Name: CLONAZEPAM 0.5MG TABLETS] 30 tablet     Sig: TAKE 1 TABLET(0.5 MG) BY MOUTH DAILY AS NEEDED FOR ANXIETY      Not Delegated - Psychiatry:  Anxiolytics/Hypnotics Failed - 03/07/2019  3:27 PM      Failed - This refill cannot be delegated      Failed - Urine Drug Screen completed in last 360 days.      Failed - Valid encounter within last 6 months    Recent Outpatient Visits           7 months ago Bright red blood per rectum   [facility], Lilia Argue, MD   9 months ago External hemorrhoid   [facility], Lilia Argue, MD   10 months ago External hemorrhoid   [facility], Lilia Argue, MD   1 year ago Thrombocytosis [facility])   [facility], Lilia Argue, MD   1 year ago Cervical pain (neck)   [facility], Lilia Argue, MD                SUMAtriptan (IMITREX) 100 MG tablet [Pharmacy Med Name: SUMATRIPTAN 100MG TABLETS] 25 tablet 5    Sig: TAKE 1 TABLET BY MOUTH IMMEDIATELY MAY REPEAT AFTER 2 HOURS AS NEEDED FOR MIGRAINE      Neurology:  Migraine Therapy - Triptan Passed - 03/07/2019  3:27 PM      Passed - Last BP in normal range    BP Readings from Last 1 Encounters:  11/28/18 106/68          Passed - Valid encounter within last 12 months    Recent Outpatient Visits           7 months ago Bright red blood per rectum   [facility], Lilia Argue, MD   9 months ago External hemorrhoid   [facility],  Lilia Argue, MD   10 months ago External hemorrhoid   [facility], Lilia Argue, MD   1 year ago Thrombocytosis [facility])   [facility], Lilia Argue, MD   1 year ago Cervical pain (neck)   [facility], Lilia Argue, MD

## 2019-03-09 NOTE — Telephone Encounter (Signed)
Please schedule appt to be seen before any further refills

## 2019-03-09 NOTE — Telephone Encounter (Signed)
Pt scheduled  

## 2019-03-12 ENCOUNTER — Telehealth (INDEPENDENT_AMBULATORY_CARE_PROVIDER_SITE_OTHER): Payer: 59 | Admitting: Family Medicine

## 2019-03-12 DIAGNOSIS — K51211 Ulcerative (chronic) proctitis with rectal bleeding: Secondary | ICD-10-CM | POA: Diagnosis not present

## 2019-03-12 DIAGNOSIS — G43109 Migraine with aura, not intractable, without status migrainosus: Secondary | ICD-10-CM

## 2019-03-12 DIAGNOSIS — F411 Generalized anxiety disorder: Secondary | ICD-10-CM

## 2019-03-12 DIAGNOSIS — S73191S Other sprain of right hip, sequela: Secondary | ICD-10-CM

## 2019-03-12 DIAGNOSIS — M542 Cervicalgia: Secondary | ICD-10-CM | POA: Diagnosis not present

## 2019-03-12 DIAGNOSIS — S73191A Other sprain of right hip, initial encounter: Secondary | ICD-10-CM

## 2019-03-12 MED ORDER — DULOXETINE HCL 60 MG PO CPEP: ORAL_CAPSULE | ORAL | 1 refills | Status: DC

## 2019-03-12 MED ORDER — CYCLOBENZAPRINE HCL 10 MG PO TABS: ORAL_TABLET | ORAL | 3 refills | Status: DC

## 2019-03-12 MED ORDER — CLONAZEPAM 0.5 MG PO TABS: ORAL_TABLET | ORAL | 2 refills | Status: DC

## 2019-03-12 MED ORDER — SUMATRIPTAN SUCCINATE 100 MG PO TABS: ORAL_TABLET | ORAL | 5 refills | Status: DC

## 2019-03-12 NOTE — Progress Notes (Signed)
refills needed on pended medication, no other medical concerns day. Gad 7 completed score 10

## 2019-03-12 NOTE — Progress Notes (Signed)
Virtual Visit Note  I connected with patient on 03/12/19 at 521pm by video doximity and verified that I am speaking with the correct person using two identifiers. [name] is currently located at home and patient is currently with them during visit. The provider, Rutherford Guys, MD is located in their office at time of visit.  I discussed the limitations, risks, security and privacy concerns of performing an evaluation and management service by telephone and the availability of in person appointments. I also discussed with the patient that there may be a patient responsible charge related to this service. The patient expressed understanding and agreed to proceed.   CC: routine followup  HPI ? Since last OV: 1. Diagnosed with UC - cont to see GI, overall doing better, did have minor flare up as had to stop meds for surgery, but now back on them and getting better 2. Had cervical fusion, dec 2020, ant approach, migraines sign improved in intensity and frequency, imitrex for abortive, down to about 4 a month 3. Has been recommended to see specialist at [facility] for right hip labral tear, daily hip/sciatica pain, uses flexeril prn 4. Started seeing a therapist, several deaths this year, overall stable on current meds, duloxetine daily, clonazepam prn 5. Got married  Allergies  Allergen Reactions  . Molds & Smuts Swelling    Prior to Admission medications   Medication Sig Start Date End Date Taking? Authorizing Provider  azelastine (ASTELIN) 0.1 % nasal spray Place 2 sprays into both nostrils 2 (two) times daily. 12/23/17  Yes Rutherford Guys, MD  clonazePAM (KLONOPIN) 0.5 MG tablet TAKE 1 TABLET(0.5 MG) BY MOUTH DAILY AS NEEDED FOR ANXIETY 10/17/18  Yes Rutherford Guys, MD  cyclobenzaprine (FLEXERIL) 10 MG tablet TAKE 1 TABLET(10 MG) BY MOUTH AT BEDTIME 08/22/18  Yes Rutherford Guys, MD  DULoxetine (CYMBALTA) 60 MG capsule TAKE 1 CAPSULE(60 MG) BY MOUTH DAILY 02/16/19  Yes Rutherford Guys, MD  Fremanezumab-vfrm (AJOVY) 225 MG/1.5ML SOSY Inject 225 mg into the skin every 30 (thirty) days. 08/19/18  Yes Melvenia Beam, MD  hydrocortisone (ANUSOL-HC) 2.5 % rectal cream Place 1 application rectally 2 (two) times daily. 06/05/18  Yes Rutherford Guys, MD  mesalamine (ROWASA) 4 g enema PLACE 1 ENEMA RECTALLY TWICE DAILY 01/16/19  Yes [place], MD  SUMAtriptan (IMITREX) 100 MG tablet take 1 tablet by mouth immediately may repeat after 2 hours if needed for MIGRAINE 12/23/17  Yes Rutherford Guys, MD  SUMAtriptan 6 MG/0.5ML SOAJ Inject 0.5 mLs into the skin daily as needed. 12/23/17  Yes Rutherford Guys, MD    Past Medical History:  Diagnosis Date  . ACL graft tear (HCC)    left knee  . Allergy   . Anxiety   . Complication of anesthesia    woke up during surgery  . Depression   . Family history of adverse reaction to anesthesia     mom is difficult to put to sleep  . IUD (intrauterine device) in place   . Knee pain, right   . Migraine headache   . Sleep paralysis   . Ulcerative proctitis [facility])     Past Surgical History:  Procedure Laterality Date  . ANTERIOR CRUCIATE LIGAMENT REPAIR Left 08/10/2014   Procedure: ANTERIOR CRUCIATE LIGAMENT (ACL) REPAIR;  Surgeon: Sydnee Cabal, MD;  Location: [facility];  Service: Orthopedics;  Laterality: Left;  . KNEE ARTHROSCOPY Left 08/10/2014   Procedure: ARTHROSCOPY KNEE DEBRIDEMENT ALLOGRAFT  ACL REVISION RECONSTRUCTION;  Surgeon: Sydnee Cabal, MD;  Location: [facility];  Service: Orthopedics;  Laterality: Left;  . KNEE ARTHROSCOPY W/ ACL RECONSTRUCTION Left 2006  . widom tooth extraction     only had 1 wisdom tooth     Social History   Tobacco Use  . Smoking status: Never Smoker  . Smokeless tobacco: Never Used  Substance Use Topics  . Alcohol use: Yes    Alcohol/week: 4.0 standard drinks    Types: 4 Glasses of wine per week    Family History  Problem Relation Age of Onset    . Goiter Father   . Hyperlipidemia Father   . Arthritis Mother   . Hypertension Maternal Grandmother   . Alzheimer's disease Maternal Grandmother   . Arthritis Maternal Grandmother   . Stroke Maternal Grandfather   . Hypertension Maternal Grandfather   . Stroke Paternal Grandmother   . Breast cancer Paternal Grandmother   . Migraines Other        on her father's side  . Colon cancer Neg Hx   . Esophageal cancer Neg Hx   . Rectal cancer Neg Hx   . Stomach cancer Neg Hx     ROS Per hpi  Objective  Vitals as reported by the patient: none Gen: AAOx3, NAD Breathing comfortably, speaking in full sentences   ASSESSMENT and PLAN  1. GAD (generalized anxiety disorder) Controlled. Continue current regime. pmp reviewed - clonazePAM (KLONOPIN) 0.5 MG tablet; TAKE 1 TABLET(0.5 MG) BY MOUTH DAILY AS NEEDED FOR ANXIETY  2. Cervical pain (neck) S/p fusion, under neurosurg care - cyclobenzaprine (FLEXERIL) 10 MG tablet; TAKE 1 TABLET(10 MG) BY MOUTH AT BEDTIME  3. Migraine with aura and without status migrainosus, not intractable Much improved since cervical spine fusion  - SUMAtriptan (IMITREX) 100 MG tablet; take 1 tablet by mouth immediately may repeat after 2 hours if needed for MIGRAINE  4. Ulcerative proctitis with rectal bleeding (HCC) Under GI care, overall stable  5. Tear of right acetabular labrum, sequela Will be following with specialist  Other orders - DULoxetine (CYMBALTA) 60 MG capsule; TAKE 1 CAPSULE(60 MG) BY MOUTH DAILY  FOLLOW-UP: after she sees specialist at [facility]   The above assessment and management plan was discussed with the patient. The patient verbalized understanding of and has agreed to the management plan. Patient is aware to call the clinic if symptoms persist or worsen. Patient is aware when to return to the clinic for a follow-up visit. Patient educated on when it is appropriate to go to the emergency department.    I provided 21 minutes of  non-face-to-face time during this encounter.  Rutherford Guys, MD Primary Care at [facility] [place], [place] [address] Ph.  [phone] Fax [phone]

## 2019-03-27 ENCOUNTER — Telehealth: Payer: Self-pay | Admitting: Family Medicine

## 2019-03-27 NOTE — Telephone Encounter (Signed)
Pt called and is requesting to have her depression medication changed. The pt states that the cymbalta is not helping and she is requesting to be changed back to the medication she was on two years ago (wellbutrin). Pt is requesting to speak with a nurse regarding the medications that did not work in the past.Please advise.     [facility] [phone] - [place], [place] - [address]  [place] [address]  Phone: [phone] Fax: [phone]  Not a 24 hour pharmacy; exact hours not known.

## 2019-03-30 MED ORDER — BUPROPION HCL ER (XL) 150 MG PO TB24: 150.0000 mg | ORAL_TABLET | Freq: Every day | ORAL | 1 refills | Status: DC

## 2019-03-30 MED ORDER — DULOXETINE HCL 30 MG PO CPEP: ORAL_CAPSULE | ORAL | 1 refills | Status: DC

## 2019-03-30 NOTE — Telephone Encounter (Signed)
Left VM explaining need to wean off duloxetine meds sent followup in 4 weeks

## 2019-03-30 NOTE — Telephone Encounter (Signed)
Please advise 

## 2019-04-10 ENCOUNTER — Ambulatory Visit: Payer: 59 | Attending: Internal Medicine

## 2019-04-10 DIAGNOSIS — Z20822 Contact with and (suspected) exposure to covid-19: Secondary | ICD-10-CM

## 2019-04-11 LAB — NOVEL CORONAVIRUS, NAA: SARS-CoV-2, NAA: NOT DETECTED

## 2019-04-17 ENCOUNTER — Ambulatory Visit (INDEPENDENT_AMBULATORY_CARE_PROVIDER_SITE_OTHER): Payer: 59 | Admitting: Gastroenterology

## 2019-04-17 DIAGNOSIS — K529 Noninfective gastroenteritis and colitis, unspecified: Secondary | ICD-10-CM

## 2019-04-17 DIAGNOSIS — G8929 Other chronic pain: Secondary | ICD-10-CM | POA: Diagnosis not present

## 2019-04-17 DIAGNOSIS — K51211 Ulcerative (chronic) proctitis with rectal bleeding: Secondary | ICD-10-CM

## 2019-04-17 DIAGNOSIS — R1031 Right lower quadrant pain: Secondary | ICD-10-CM | POA: Diagnosis not present

## 2019-04-17 MED ORDER — MESALAMINE 4 G RE ENEM: ENEMA | RECTAL | 1 refills | Status: DC

## 2019-04-17 NOTE — Patient Instructions (Signed)
Your provider has requested that you go to the basement level for lab work before leaving today. Press "B" on the elevator. The lab is located at the first door on the left as you exit the elevator.   Repeat stool test one week before your next appointment.   We have sent the following medications to your pharmacy for you to pick up at your convenience: Rowasa 4 gram twice a day  Your provider recommends you get a flu vaccine.   You have been scheduled for a CT scan of the abdomen and pelvis at [facility], 1st floor Radiology.  You are scheduled on 04-23-19 at 8:00 am. You should arrive 15 minutes prior to your appointment time for registration. Please follow the written instructions below on the day of your exam:  WARNING: IF YOU ARE ALLERGIC TO IODINE/X-RAY DYE, PLEASE NOTIFY RADIOLOGY IMMEDIATELY AT [phone]! YOU WILL BE GIVEN A 13 HOUR PREMEDICATION PREP.  1) Do not eat anything after 4:00 am (4 hours prior to your test) 2) You have been given 2 bottles of oral contrast to drink. The solution may taste better if refrigerated, but do NOT add ice or any other liquid to this solution. Shake well before drinking.    Drink 1 bottle of contrast @ 6:00 am (2 hours prior to your exam)  Drink 1 bottle of contrast @ 7:00 am (1 hour prior to your exam)  You may take any medications as prescribed with a small amount of water, if necessary. If you take any of the following medications: METFORMIN, GLUCOPHAGE, GLUCOVANCE, AVANDAMET, RIOMET, FORTAMET, [place] MET, JANUMET, GLUMETZA or METAGLIP, you MAY be asked to HOLD this medication 48 hours AFTER the exam.  The purpose of you drinking the oral contrast is to aid in the visualization of your intestinal tract. The contrast solution may cause some diarrhea. Depending on your individual set of symptoms, you may also receive an intravenous injection of x-ray contrast/dye. Plan on being at [facility] for 30 minutes or longer, depending on the  type of exam you are having performed.  This test typically takes 30-45 minutes to complete.  If you have any questions regarding your exam or if you need to reschedule, you may call [phone] between the hours of 8:00 am and 5:00 pm, Monday-Friday.  ________________________________________________________________________

## 2019-04-17 NOTE — Progress Notes (Signed)
[place]   TELEHEALTH VISIT  Referring Provider: Rutherford Guys, MD Primary Care Physician:  Rutherford Guys, MD  Tele-visit due to COVID-19 pandemic Patient requested visit virtually, consented to the virtual encounter via video enabled telemedicine application (Doximity) Contact made at: 04/17/19 09:10 Patient verified by name and date of birth Location of patient: Home Location provider: Home office Names of persons participating: Me, patient, [name] CMA Time spent on telehealth visit: 28 minutes on the video visit, additional time spent performing the visit I discussed the limitations of evaluation and management by telemedicine. The patient expressed understanding and agreed to proceed.  Chief complaint:  Ulcerative colitis   IMPRESSION:  Right lower quadrant with concerns for appendicitis Distal ulcerative colitis presenting with rectal bleeding    - diagnosed on colonoscopy 10/22/18    - fecal calprotectin 340 11/11/18 Psoas/sciatica pain No known family history of colon cancer or polyps  New right lower quadrant pain may be related to psoas injury.  Given patient concern for appendicitis we'll proceed with a CT of the abdomen and pelvis with contrast.  This will also give [place] some additional information about colitis activity.  He was instructed to go to the emergency room with any escalating symptoms or those associated with systemic complaints such as fevers, chills, or night sweats.  Recent back pain may be an extra GI manifestation of inflammatory bowel disease.  In such cases symptoms are generally unrelated to IBD activity.  Would have a low threshold to consider referral to a rheumatologist.  She has a relatively new diagnosis of ulcerative proctitis and stopped therapy for the last 2 months with her recent cervical spine fusion.  In addition, she did not have the screening labs recommended at the time of her initial consultation.  Will obtain those now including fecal  calprotectin.  We discussed using oral 5 ASA therapy to control her proctitis particularly as she has been unable to use her suppositories during this time.  However, she prefers to use suppositories and declined a trial of oral mesalamine.  Therefore, will use Rowasa enemas 1 gram PR BID for at least 4 weeks and until remission. When remission is achieved, will reduce the dose to daily.  Avoiding NSAIDs remains important.  No iron deficiency noted on labs from the fall.  She should obtain the Covid vaccine when available.    PLAN: CT abd/pelvis with contrast: evaluate for appendicitis and colitis Vit D, B6, hepatic function, ESR, CRP, CBC, BMP (preparation for CT)  Fecal calprotectin now and one week before next office week Avoid all NSAIDs Rowasa 4g PR BID x 8 weeks Covid vaccine recommended when available Return in 6 weeks, or earlier as needed   I spent 40 minutes of time, including in depth chart review, independent review of results as outlined above, communicating results with the patient directly, face-to-face time with the patient, coordinating care, ordering studies and medications as appropriate, and documentation.  HPI: [name] is a 34 y.o. female with a new diagnosis of ulcerative colitis.  She was seen in initial consultation 08/01/2018 after a 10-monthhistory of rectal bleeding and altered bowel habits.  She had a colonoscopy 10/22/2018 that showed proctitis involving the distal 16 cm of the rectum.  Biopsies confirmed inflammatory bowel disease in the rectum.  Biopsies throughout the remainder of the colon were normal.  She was started on Rowasa enemas.    She is seen today in routine follow-up.  The interval history is obtained through  the patient and her electronic health record. She has depression, migraines, DDD of neck, chronic right hip painfrom labral tear, angioedema, and chronic rhinitis. She had cervical fusion 01/2019 with improvement in her migraines.   Didn't  use the enemas for 2 months after her spinal fusion. Her bleeding returned. Having a bowel movement twice weekly, otherwise it's blood or diarrhea blobs.  Appetite is good.  Weight is stable.  She is very bothered by sciatica and psoas pain. Pain in her right back that radiates around to the right stomach. She is concerned this is related to her appendix. Some intermittent but severe right abdominal pain. No nausea, fevers, or chills. Going to [facility] treatment of the psoas injury.   Endoscopic history: Colonoscopy 10/22/18: - Diffuse mild inflammation characterized by erythema, friability and granularity was found in the rectum extending to 16cm from the anal verge.  The remainder of the colon appeared normal. -Rectal biopsies showed moderately active chronic colitis consistent with inflammatory bowel disease.  Other biopsies taken throughout the colon and terminal ileum were normal.  Labs 04/03/17: ALT 18, AST 18, alk phos 96, TB 0.3 Labs 10/22/18: iron 97, ferritin 45.7, WBC 10.7, hgb 13.2, platelets 338 Labs 11/11/18: fecal calprotectin 340, GI pathogen panel negative  Health Maintenance:  - DEXA: N/A - Vaccinations:      - Annual Flu Vaccine  recommend, has not had one the last 2 years      - Pneumococcal Vaccine: N/A  - Micronutrient eval:       - Annual Vit D, B6, iron panel: Ordered on last visit, not obtained - Annual Pap (if immunosuppressed): By Dr. Philis Pique 02/11/2018 - Surveillance labs for immunomodulators: N/A - Annual depression screening: None  Past Medical History:  Diagnosis Date  . ACL graft tear (HCC)    left knee  . Allergy   . Anxiety   . Complication of anesthesia    woke up during surgery  . Depression   . Family history of adverse reaction to anesthesia     mom is difficult to put to sleep  . IUD (intrauterine device) in place   . Knee pain, right   . Migraine headache   . Sleep paralysis   . Ulcerative proctitis [facility])     Past Surgical  History:  Procedure Laterality Date  . ANTERIOR CRUCIATE LIGAMENT REPAIR Left 08/10/2014   Procedure: ANTERIOR CRUCIATE LIGAMENT (ACL) REPAIR;  Surgeon: Sydnee Cabal, MD;  Location: [place];  Service: Orthopedics;  Laterality: Left;  . KNEE ARTHROSCOPY Left 08/10/2014   Procedure: ARTHROSCOPY KNEE DEBRIDEMENT ALLOGRAFT ACL REVISION RECONSTRUCTION;  Surgeon: Sydnee Cabal, MD;  Location: [facility];  Service: Orthopedics;  Laterality: Left;  . KNEE ARTHROSCOPY W/ ACL RECONSTRUCTION Left 2006  . widom tooth extraction     only had 1 wisdom tooth     Current Outpatient Medications  Medication Sig Dispense Refill  . azelastine (ASTELIN) 0.1 % nasal spray Place 2 sprays into both nostrils 2 (two) times daily. 30 mL 5  . buPROPion (WELLBUTRIN XL) 150 MG 24 hr tablet Take 1 tablet (150 mg total) by mouth daily. 30 tablet 1  . clonazePAM (KLONOPIN) 0.5 MG tablet TAKE 1 TABLET(0.5 MG) BY MOUTH DAILY AS NEEDED FOR ANXIETY 30 tablet 2  . cyclobenzaprine (FLEXERIL) 10 MG tablet TAKE 1 TABLET(10 MG) BY MOUTH AT BEDTIME 30 tablet 3  . DULoxetine (CYMBALTA) 30 MG capsule TAKE 1 CAPSULE(60 MG) BY MOUTH DAILY 30 capsule 1  .  Fremanezumab-vfrm (AJOVY) 225 MG/1.5ML SOSY Inject 225 mg into the skin every 30 (thirty) days. 1 mL 11  . hydrocortisone (ANUSOL-HC) 2.5 % rectal cream Place 1 application rectally 2 (two) times daily. 30 g 2  . mesalamine (ROWASA) 4 g enema PLACE 1 ENEMA RECTALLY TWICE DAILY 60 enema 1  . SUMAtriptan (IMITREX) 100 MG tablet take 1 tablet by mouth immediately may repeat after 2 hours if needed for MIGRAINE 25 tablet 5  . SUMAtriptan 6 MG/0.5ML SOAJ Inject 0.5 mLs into the skin daily as needed. 0.5 mL 3   No current facility-administered medications for this visit.    Allergies as of 04/17/2019 - Review Complete 03/12/2019  Allergen Reaction Noted  . Molds & smuts Swelling 05/13/2017    Family History  Problem Relation Age of Onset  . Goiter  Father   . Hyperlipidemia Father   . Arthritis Mother   . Hypertension Maternal Grandmother   . Alzheimer's disease Maternal Grandmother   . Arthritis Maternal Grandmother   . Stroke Maternal Grandfather   . Hypertension Maternal Grandfather   . Stroke Paternal Grandmother   . Breast cancer Paternal Grandmother   . Migraines Other        on her father's side  . Colon cancer Neg Hx   . Esophageal cancer Neg Hx   . Rectal cancer Neg Hx   . Stomach cancer Neg Hx     Social History   Socioeconomic History  . Marital status: Married    Spouse name: Cristie Hem   . Number of children: 0  . Years of education: Not on file  . Highest education level: Not on file  Occupational History  . Not on file  Tobacco Use  . Smoking status: Never Smoker  . Smokeless tobacco: Never Used  Substance and Sexual Activity  . Alcohol use: Yes    Alcohol/week: 4.0 standard drinks    Types: 4 Glasses of wine per week  . Drug use: No  . Sexual activity: Yes    Birth control/protection: I.U.D.  Other Topics Concern  . Not on file  Social History Narrative   Lives at home with her husband and pets   Right handed   Caffeine: 1-2 cups daily   Social Determinants of Health   Financial Resource Strain:   . Difficulty of Paying Living Expenses: Not on file  Food Insecurity:   . Worried About Charity fundraiser in the Last Year: Not on file  . Ran Out of Food in the Last Year: Not on file  Transportation Needs:   . Lack of Transportation (Medical): Not on file  . Lack of Transportation (Non-Medical): Not on file  Physical Activity:   . Days of Exercise per Week: Not on file  . Minutes of Exercise per Session: Not on file  Stress:   . Feeling of Stress : Not on file  Social Connections:   . Frequency of Communication with Friends and Family: Not on file  . Frequency of Social Gatherings with Friends and Family: Not on file  . Attends Religious Services: Not on file  . Active Member of Clubs or  Organizations: Not on file  . Attends Archivist Meetings: Not on file  . Marital Status: Not on file  Intimate Partner Violence:   . Fear of Current or Ex-Partner: Not on file  . Emotionally Abused: Not on file  . Physically Abused: Not on file  . Sexually Abused: Not on file   PHYSICAL  EXAM: Complete physical exam not performed due to the limits inherent in a telehealth encounter.  Gen: Awake, alert, and oriented, and well communicative. HEENT: EOMI, non-icteric sclera, NCAT, MMM  Neck: Normal movement of head and neck  Pulm: No labored breathing, speaking in full sentences without conversational dyspnea  Derm: No apparent lesions or bruising in visible field  MS: Moves all visible extremities without noticeable abnormality  Psych: Pleasant, cooperative, normal speech, thought processing seemingly intact     Dax Murguia L. Tarri Glenn, MD, MPH [facility] 04/17/2019, 8:31 AM

## 2019-04-20 NOTE — Addendum Note (Signed)
Addended by: Wyline Beady on: 04/20/2019 08:29 AM   Modules accepted: Orders

## 2019-04-23 ENCOUNTER — Ambulatory Visit (HOSPITAL_COMMUNITY): Admission: RE | Admit: 2019-04-23 | Payer: 59 | Source: Ambulatory Visit

## 2019-04-24 ENCOUNTER — Other Ambulatory Visit: Payer: Self-pay

## 2019-04-24 ENCOUNTER — Ambulatory Visit (HOSPITAL_COMMUNITY)
Admission: RE | Admit: 2019-04-24 | Discharge: 2019-04-24 | Disposition: A | Discharge status: Home / Self Care | Payer: 59 | Source: Ambulatory Visit | Attending: Gastroenterology | Admitting: Gastroenterology

## 2019-04-24 DIAGNOSIS — K529 Noninfective gastroenteritis and colitis, unspecified: Secondary | ICD-10-CM | POA: Diagnosis present

## 2019-04-24 MED ORDER — SODIUM CHLORIDE (PF) 0.9 % IJ SOLN
INTRAMUSCULAR | Status: AC
  Filled 2019-04-24: qty 50

## 2019-04-24 MED ORDER — IOHEXOL 300 MG/ML  SOLN
100.0000 mL | Freq: Once | INTRAMUSCULAR | Status: AC | PRN
  Administered 2019-04-24: 100 mL via INTRAVENOUS

## 2019-04-27 ENCOUNTER — Encounter: Payer: Self-pay | Admitting: *Deleted

## 2019-04-28 ENCOUNTER — Other Ambulatory Visit (HOSPITAL_COMMUNITY): Payer: 59

## 2019-05-08 ENCOUNTER — Telehealth: Payer: Self-pay

## 2019-05-08 NOTE — Telephone Encounter (Signed)
Pt called, phone line is busy

## 2019-06-02 ENCOUNTER — Other Ambulatory Visit: Payer: Self-pay | Admitting: Family Medicine

## 2019-06-02 NOTE — Telephone Encounter (Signed)
Requested Prescriptions  Pending Prescriptions Disp Refills  . buPROPion (WELLBUTRIN XL) 150 MG 24 hr tablet [Pharmacy Med Name: BUPROPION XL 150MG  TABLETS (24 H)] 90 tablet 1    Sig: TAKE 1 TABLET(150 MG) BY MOUTH DAILY     Psychiatry: Antidepressants - bupropion Passed - 06/02/2019  4:05 AM      Passed - Last BP in normal range    BP Readings from Last 1 Encounters:  11/28/18 106/68         Passed - Valid encounter within last 6 months    Recent Outpatient Visits          2 months ago GAD (generalized anxiety disorder)   [facility] Lilia Argue, MD   10 months ago Bright red blood per rectum   [facility], Lilia Argue, MD   12 months ago External hemorrhoid   [facility], Lilia Argue, MD   1 year ago External hemorrhoid   [facility], Lilia Argue, MD   1 year ago Thrombocytosis [facility])   [facility], Lilia Argue, MD

## 2019-06-02 NOTE — Telephone Encounter (Signed)
Requested Prescriptions  Pending Prescriptions Disp Refills  . DULoxetine (CYMBALTA) 30 MG capsule [Pharmacy Med Name: DULOXETINE DR 30MG CAPSULES] 30 capsule 1    Sig: TAKE 1 CAPSULE(60MG) BY MOUTH DAILY.     Psychiatry: Antidepressants - SNRI Passed - 06/02/2019  9:04 AM      Passed - Last BP in normal range    BP Readings from Last 1 Encounters:  11/28/18 106/68         Passed - Valid encounter within last 6 months    Recent Outpatient Visits          2 months ago GAD (generalized anxiety disorder)   [facility], Lilia Argue, MD   10 months ago Bright red blood per rectum   [facility], Lilia Argue, MD   12 months ago External hemorrhoid   [facility], Lilia Argue, MD   1 year ago External hemorrhoid   [facility], Lilia Argue, MD   1 year ago Thrombocytosis [facility])   [facility] Lilia Argue, MD

## 2019-06-09 ENCOUNTER — Other Ambulatory Visit (INDEPENDENT_AMBULATORY_CARE_PROVIDER_SITE_OTHER): Payer: 59

## 2019-06-09 DIAGNOSIS — R1031 Right lower quadrant pain: Secondary | ICD-10-CM | POA: Diagnosis not present

## 2019-06-09 DIAGNOSIS — G8929 Other chronic pain: Secondary | ICD-10-CM | POA: Diagnosis not present

## 2019-06-09 DIAGNOSIS — K51211 Ulcerative (chronic) proctitis with rectal bleeding: Secondary | ICD-10-CM | POA: Diagnosis not present

## 2019-06-09 DIAGNOSIS — K529 Noninfective gastroenteritis and colitis, unspecified: Secondary | ICD-10-CM | POA: Diagnosis not present

## 2019-06-09 LAB — HEPATIC FUNCTION PANEL
ALT: 18 U/L (ref 0–35)
AST: 17 U/L (ref 0–37)
Albumin: 4.6 g/dL (ref 3.5–5.2)
Alkaline Phosphatase: 81 U/L (ref 39–117)
Bilirubin, Direct: 0.1 mg/dL (ref 0.0–0.3)
Total Bilirubin: 0.5 mg/dL (ref 0.2–1.2)
Total Protein: 7.1 g/dL (ref 6.0–8.3)

## 2019-06-09 LAB — BASIC METABOLIC PANEL
BUN: 13 mg/dL (ref 6–23)
CO2: 28 mEq/L (ref 19–32)
Calcium: 9.3 mg/dL (ref 8.4–10.5)
Chloride: 102 mEq/L (ref 96–112)
Creatinine, Ser: 0.62 mg/dL (ref 0.40–1.20)
GFR: 109.41 mL/min (ref >60.00)
Glucose, Bld: 87 mg/dL (ref 70–99)
Potassium: 3.8 mEq/L (ref 3.5–5.1)
Sodium: 137 mEq/L (ref 135–145)

## 2019-06-09 LAB — CBC
HCT: 39.1 % (ref 36.0–46.0)
Hemoglobin: 13.5 g/dL (ref 12.0–15.0)
MCHC: 34.4 g/dL (ref 30.0–36.0)
MCV: 91.8 fl (ref 78.0–100.0)
Platelets: 396 10*3/uL (ref 150.0–400.0)
RBC: 4.26 Mil/uL (ref 3.87–5.11)
RDW: 12.2 % (ref 11.5–15.5)
WBC: 6 10*3/uL (ref 4.0–10.5)

## 2019-06-09 LAB — SEDIMENTATION RATE: Sed Rate: 6 mm/h (ref 0–20)

## 2019-06-09 LAB — C-REACTIVE PROTEIN: CRP: <1 mg/dL (ref 0.5–20.0)

## 2019-06-11 ENCOUNTER — Ambulatory Visit (INDEPENDENT_AMBULATORY_CARE_PROVIDER_SITE_OTHER): Payer: 59 | Admitting: Gastroenterology

## 2019-06-11 DIAGNOSIS — K51211 Ulcerative (chronic) proctitis with rectal bleeding: Secondary | ICD-10-CM | POA: Diagnosis not present

## 2019-06-11 DIAGNOSIS — R1031 Right lower quadrant pain: Secondary | ICD-10-CM

## 2019-06-11 NOTE — Progress Notes (Signed)
[place]   TELEHEALTH VISIT  Referring Provider: Rutherford Guys, MD Primary Care Physician:  Rutherford Guys, MD  Tele-visit due to COVID-19 pandemic Patient requested visit virtually, consented to the virtual encounter via video enabled telemedicine application (Doximity) Contact made at: 06/11/19 11:45 Patient verified by name and date of birth Location of patient: Home Location provider: Home office Names of persons participating: Me, patient, [name] CMA Time spent on telehealth visit: 30 minutes on the video visit, additional time spent performing the visit I discussed the limitations of evaluation and management by telemedicine. The patient expressed understanding and agreed to proceed.  Chief complaint:  Ulcerative colitis   IMPRESSION:  Distal ulcerative colitis presenting with rectal bleeding    - diagnosed on colonoscopy 10/22/18    - fecal calprotectin 340 11/11/18 Right lower quadrant with concerns for appendicitis, now resolved    - no source identified on CTAP 04/24/19 Psoas/sciatica pain No known family history of colon cancer or polyps  Clinical response to resumption of Rowasa enemas although she does not seem to be in clinical remission given the intermittent bleedinging. Reviewed the need to use treatment BID every day.  Screening labs reviewed with the patient as noted below.  B6, Vit D, and fecal calprotectin are pending.   Consider treatment with oral 5 ASA therapy if she is not responding to Rowasa.  When remission is achieved, will reduce the dose to daily.  Avoiding NSAIDs remains important.  She should obtain the Covid vaccine when available.    PLAN: Fecal calprotectin now and one week before next office week Avoid all NSAIDs Continue Rowasa 4g PR BID (refilled today) Covid vaccine recommended Return in 6 weeks, or earlier as needed   HPI: [name] is a 34 y.o. female with distal ulcerative colitis.  She was seen in initial consultation  08/01/2018 after a 35-monthhistory of rectal bleeding and altered bowel habits.  She had a colonoscopy 10/22/2018 that showed proctitis involving the distal 16 cm of the rectum.  Biopsies confirmed inflammatory bowel disease in the rectum.  Biopsies throughout the remainder of the colon were normal.  She was started on Rowasa enemas with improved in her bleeding and normalization of her bowel frequency.  She was seen in follow-up 04/17/19 when she had stopped Rowasa for 2 months after her spinal fusion when she had difficulty inserting the enemas. She had recurrent bleding and altered bowel habits as well as some pain that had her concerned about appendicitis.  Fecal calprotectin was recommended but not performed. CT abd/pelvis with contrast 04/24/19 was normal except for moderate stool throughout the colon.  She resumed Rowasa enemas. She is seen today in scheduled follow-up.   The interval history is obtained through the patient and review of her electronic health record. She has depression, migraines, DDD of neck, chronic right hip painfrom labral tear, angioedema, and chronic rhinitis. She had cervical fusion 01/2019 with improvement in her migraines. [facility]  Has recently gained 15 pounds due to immobility.   Has resumed Rowasa BID.  Less bleeding with enemas. Some increase in bowel frequency. Normally 2-3 BM daily. Now having QD but has increased since her symptoms when we first met. She is satisfied with the response.  No longer having abdominal pain.   Significant stress in her life that may be exacerbating her symptoms. Good appetite. Unintentional weight gain. No extra-GI manifestations of IBD except for possible hip pain.   Going on  vacation to [place] with her girlfriend next week. She plans to work remotely while she is there.  She has not had the Covid vaccine. She is waiting but plans to get it in the future.    No new complaints or concerns.    Endoscopic history: Colonoscopy 10/22/18: - Diffuse mild inflammation characterized by erythema, friability and granularity was found in the rectum extending to 16cm from the anal verge.  The remainder of the colon appeared normal. -Rectal biopsies showed moderately active chronic colitis consistent with inflammatory bowel disease.  Other biopsies taken throughout the colon and terminal ileum were normal.  Labs 04/03/17: ALT 18, AST 18, alk phos 96, TB 0.3 Labs 10/22/18: iron 97, ferritin 45.7, WBC 10.7, hgb 13.2, platelets 338 Labs 11/11/18: fecal calprotectin 340, GI pathogen panel negative Labs 06/09/19: normal CMP including ALT 18, AST 17, CRP <1, normal CBC, ESR 6  Health Maintenance:  - DEXA: N/A - Vaccinations:      - Annual Flu Vaccine  recommend, has not had one the last 2 years      - Pneumococcal Vaccine: N/A      - Covid Vaccine: declines   - Micronutrient eval:       - Annual Vit D, B6, iron panel: Ordered on last visit, not obtained - Annual Pap (if immunosuppressed): By Dr. Philis Pique 02/11/2018 - Surveillance labs for immunomodulators: N/A - Annual depression screening: None  Past Medical History:  Diagnosis Date  . ACL graft tear (HCC)    left knee  . Allergy   . Anxiety   . Complication of anesthesia    woke up during surgery  . Depression   . Family history of adverse reaction to anesthesia     mom is difficult to put to sleep  . IUD (intrauterine device) in place   . Knee pain, right   . Migraine headache   . Sleep paralysis   . Ulcerative proctitis [facility])     Past Surgical History:  Procedure Laterality Date  . ANTERIOR CRUCIATE LIGAMENT REPAIR Left 08/10/2014   Procedure: ANTERIOR CRUCIATE LIGAMENT (ACL) REPAIR;  Surgeon: Sydnee Cabal, MD;  Location: [place];  Service: Orthopedics;  Laterality: Left;  . KNEE ARTHROSCOPY Left 08/10/2014   Procedure: ARTHROSCOPY KNEE DEBRIDEMENT ALLOGRAFT ACL REVISION RECONSTRUCTION;  Surgeon: Sydnee Cabal, MD;  [facility];  Service: Orthopedics;  Laterality: Left;  . KNEE ARTHROSCOPY W/ ACL RECONSTRUCTION Left 2006  . widom tooth extraction     only had 1 wisdom tooth     Current Outpatient Medications  Medication Sig Dispense Refill  . azelastine (ASTELIN) 0.1 % nasal spray Place 2 sprays into both nostrils 2 (two) times daily. 30 mL 5  . buPROPion (WELLBUTRIN XL) 150 MG 24 hr tablet TAKE 1 TABLET(150 MG) BY MOUTH DAILY 90 tablet 1  . clonazePAM (KLONOPIN) 0.5 MG tablet TAKE 1 TABLET(0.5 MG) BY MOUTH DAILY AS NEEDED FOR ANXIETY 30 tablet 2  . cyclobenzaprine (FLEXERIL) 10 MG tablet TAKE 1 TABLET(10 MG) BY MOUTH AT BEDTIME 30 tablet 3  . DULoxetine (CYMBALTA) 30 MG capsule TAKE 1 CAPSULE(60MG) BY MOUTH DAILY. 30 capsule 1  . Fremanezumab-vfrm (AJOVY) 225 MG/1.5ML SOSY Inject 225 mg into the skin every 30 (thirty) days. 1 mL 11  . hydrocortisone (ANUSOL-HC) 2.5 % rectal cream Place 1 application rectally 2 (two) times daily. 30 g 2  . mesalamine (ROWASA) 4 g enema PLACE 1 ENEMA RECTALLY TWICE DAILY 60 enema 1  .  SUMAtriptan (IMITREX) 100 MG tablet take 1 tablet by mouth immediately may repeat after 2 hours if needed for MIGRAINE 25 tablet 5  . SUMAtriptan 6 MG/0.5ML SOAJ Inject 0.5 mLs into the skin daily as needed. 0.5 mL 3   No current facility-administered medications for this visit.    Allergies as of 06/11/2019 - Review Complete 03/12/2019  Allergen Reaction Noted  . Molds & smuts Swelling 05/13/2017    Family History  Problem Relation Age of Onset  . Goiter Father   . Hyperlipidemia Father   . Arthritis Mother   . Hypertension Maternal Grandmother   . Alzheimer's disease Maternal Grandmother   . Arthritis Maternal Grandmother   . Stroke Maternal Grandfather   . Hypertension Maternal Grandfather   . Stroke Paternal Grandmother   . Breast cancer Paternal Grandmother   . Migraines Other        on her father's side  . Colon cancer Neg Hx   .  Esophageal cancer Neg Hx   . Rectal cancer Neg Hx   . Stomach cancer Neg Hx     Social History   Socioeconomic History  . Marital status: Married    Spouse name: Cristie Hem   . Number of children: 0  . Years of education: Not on file  . Highest education level: Not on file  Occupational History  . Not on file  Tobacco Use  . Smoking status: Never Smoker  . Smokeless tobacco: Never Used  Substance and Sexual Activity  . Alcohol use: Yes    Alcohol/week: 4.0 standard drinks    Types: 4 Glasses of wine per week  . Drug use: No  . Sexual activity: Yes    Birth control/protection: I.U.D.  Other Topics Concern  . Not on file  Social History Narrative   Lives at home with her husband and pets   Right handed   Caffeine: 1-2 cups daily   Social Determinants of Health   Financial Resource Strain:   . Difficulty of Paying Living Expenses:   Food Insecurity:   . Worried About Charity fundraiser in the Last Year:   . Arboriculturist in the Last Year:   Transportation Needs:   . Film/video editor (Medical):   [name] Lack of Transportation (Non-Medical):   Physical Activity:   . Days of Exercise per Week:   . Minutes of Exercise per Session:   Stress:   . Feeling of Stress :   Social Connections:   . Frequency of Communication with Friends and Family:   . Frequency of Social Gatherings with Friends and Family:   . Attends Religious Services:   . Active Member of Clubs or Organizations:   . Attends Archivist Meetings:   [name] Marital Status:   Intimate Partner Violence:   . Fear of Current or Ex-Partner:   . Emotionally Abused:   [name] Physically Abused:   . Sexually Abused:    PHYSICAL EXAM: Complete physical exam not performed due to the limits inherent in a telehealth encounter.  Gen: Awake, alert, and oriented, and well communicative. HEENT: EOMI, non-icteric sclera, NCAT, MMM  Neck: Normal movement of head and neck  Pulm: No labored breathing, speaking in full  sentences without conversational dyspnea  Derm: No apparent lesions or bruising in visible field  MS: Moves all visible extremities without noticeable abnormality  Psych: Pleasant, cooperative, normal speech, thought processing seemingly intact     Janique Hoefer L. Tarri Glenn, MD, MPH [facility] 06/11/2019,  11:42 AM

## 2019-06-12 ENCOUNTER — Other Ambulatory Visit: Payer: 59

## 2019-06-13 LAB — VITAMIN D 1,25 DIHYDROXY
Vitamin D 1, 25 (OH)2 Total: 72 pg/mL (ref 18–72)
Vitamin D2 1, 25 (OH)2: <8 pg/mL
Vitamin D3 1, 25 (OH)2: 72 pg/mL

## 2019-06-13 LAB — VITAMIN B6: Vitamin B6: 16.1 ng/mL (ref 2.1–21.7)

## 2019-08-04 ENCOUNTER — Other Ambulatory Visit: Payer: Self-pay | Admitting: Family Medicine

## 2019-08-05 ENCOUNTER — Other Ambulatory Visit: Payer: 59

## 2019-08-05 DIAGNOSIS — R1031 Right lower quadrant pain: Secondary | ICD-10-CM

## 2019-08-08 LAB — CALPROTECTIN, FECAL: Calprotectin, Fecal: 246 ug/g — ABNORMAL HIGH (ref 0–120)

## 2019-08-12 ENCOUNTER — Telehealth: Payer: Self-pay | Admitting: Gastroenterology

## 2019-08-12 ENCOUNTER — Ambulatory Visit: Payer: 59 | Admitting: Gastroenterology

## 2019-08-12 NOTE — Telephone Encounter (Signed)
That is fine with me as long as her insurance will cover the virtual visit.

## 2019-08-12 NOTE — Telephone Encounter (Signed)
Dr. Tarri Glenn,  This patient came in late to her appointment today. She thought her appointment was at 2:10pm(she apologized for being late). I rescheduled her appointment to 09/22/19. She is being seen for f/u on abd pain. Patient states that she will be having major hip surgery in a week and is wanting to know if that follow up appointment with you can be virtual? She says that she has not been having any abd pain.  Thanks, [name]

## 2019-08-12 NOTE — Telephone Encounter (Signed)
I spoke to patient and she states that she will contact her insurance company to verify if they cover virtual visits.

## 2019-08-19 ENCOUNTER — Telehealth (INDEPENDENT_AMBULATORY_CARE_PROVIDER_SITE_OTHER): Payer: 59 | Admitting: Registered Nurse

## 2019-08-19 ENCOUNTER — Other Ambulatory Visit: Payer: Self-pay

## 2019-08-19 DIAGNOSIS — J069 Acute upper respiratory infection, unspecified: Secondary | ICD-10-CM | POA: Diagnosis not present

## 2019-08-19 MED ORDER — FLUTICASONE PROPIONATE 50 MCG/ACT NA SUSP: 2.0000 | Freq: Every day | NASAL | 6 refills | Status: DC

## 2019-08-19 MED ORDER — AMOXICILLIN-POT CLAVULANATE 875-125 MG PO TABS: 1.0000 | ORAL_TABLET | Freq: Two times a day (BID) | ORAL | 0 refills | Status: DC

## 2019-08-19 NOTE — Progress Notes (Signed)
Telemedicine Encounter- SOAP NOTE Established Patient  This telephone encounter was conducted with the patient's (or proxy's) verbal consent via audio telecommunications: yes  Patient was instructed to have this encounter in a suitably private space; and to only have persons present to whom they give permission to participate. In addition, patient identity was confirmed by use of name plus two identifiers (DOB and address).  I discussed the limitations, risks, security and privacy concerns of performing an evaluation and management service by telephone and the availability of in person appointments. I also discussed with the patient that there may be a patient responsible charge related to this service. The patient expressed understanding and agreed to proceed.  I spent a total of 11 minutes talking with the patient or their proxy.  Chief Complaint  Patient presents with  . sinus congestion    x 3 days , has been taking mucinex and dayquil     Subjective   [name] is a 34 y.o. established patient. Telephone visit today for upper respiratory infection  HPI Has been ongoing around one week. Noted sinus congestion around 3-4 days ago. Having Pnd, mild headache, slight cough, rhinorrhea, nasal congestion, sinus pressure and pain.  Her largest concern is an upcoming hip surgery on Monday, wants to be treated before then  Denies shob, chest pain, nvd, fevers, chills, fatigue. No sick contacts. Does note that she has an upcoming COVID test for preop  Patient Active Problem List   Diagnosis Date Noted  . Chronic migraine without aura without status migrainosus, not intractable 08/19/2018  . Migraine with aura and with status migrainosus, not intractable 08/19/2018  . Pain in left shoulder 08/19/2018  . Pain in left ankle and joints of left foot 05/09/2018  . Pain in left hip 05/09/2018  . Cervicalgia 05/09/2018  . Chronic rhinitis 11/12/2017  . Angioedema 11/12/2017  . Allergic  reaction 11/12/2017  . Chronic sinusitis 11/12/2017  . Cervical intraepithelial neoplasia grade 1 10/01/2017  . GAD (generalized anxiety disorder) 09/17/2014  . ACL graft tear ([place]) 08/10/2014  . S/P ACL reconstruction 08/10/2014  . Migraine headache 05/15/2010    Past Medical History:  Diagnosis Date  . ACL graft tear (H[facility])    left knee  . Allergy   . Anxiety   . Complication of anesthesia    woke up during surgery  . Depression   . Family history of adverse reaction to anesthesia     mom is difficult to put to sleep  . IUD (intrauterine device) in place   . Knee pain, right   . Migraine headache   . Sleep paralysis   . Ulcerative proctitis ([facility])     Current Outpatient Medications  Medication Sig Dispense Refill  . azelastine (ASTELIN) 0.1 % nasal spray Place 2 sprays into both nostrils 2 (two) times daily. 30 mL 5  . buPROPion (WELLBUTRIN XL) 150 MG 24 hr tablet TAKE 1 TABLET(150 MG) BY MOUTH DAILY 90 tablet 1  . clonazePAM (KLONOPIN) 0.5 MG tablet TAKE 1 TABLET(0.5 MG) BY MOUTH DAILY AS NEEDED FOR ANXIETY 30 tablet 2  . cyclobenzaprine (FLEXERIL) 10 MG tablet TAKE 1 TABLET(10 MG) BY MOUTH AT BEDTIME 30 tablet 3  . DULoxetine (CYMBALTA) 30 MG capsule TAKE 1 CAPSULE(60MG) BY MOUTH DAILY. 30 capsule 1  . Fremanezumab-vfrm (AJOVY) 225 MG/1.5ML SOSY Inject 225 mg into the skin every 30 (thirty) days. 1 mL 11  . guaiFENesin (MUCINEX) 600 MG 12 hr tablet Take by mouth 2 (two)  times daily.    . hydrocortisone (ANUSOL-HC) 2.5 % rectal cream Place 1 application rectally 2 (two) times daily. 30 g 2  . mesalamine (ROWASA) 4 g enema PLACE 1 ENEMA RECTALLY TWICE DAILY 60 enema 1  . Pseudoephedrine-APAP-DM (DAYQUIL PO) Take by mouth.    . SUMAtriptan (IMITREX) 100 MG tablet take 1 tablet by mouth immediately may repeat after 2 hours if needed for MIGRAINE 25 tablet 5  . SUMAtriptan 6 MG/0.5ML SOAJ Inject 0.5 mLs into the skin daily as needed. 0.5 mL 3  . amoxicillin-clavulanate  (AUGMENTIN) 875-125 MG tablet Take 1 tablet by mouth 2 (two) times daily. 14 tablet 0  . fluticasone (FLONASE) 50 MCG/ACT nasal spray Place 2 sprays into both nostrils daily. 16 g 6   No current facility-administered medications for this visit.    Allergies  Allergen Reactions  . Molds & Smuts Swelling    Social History   Socioeconomic History  . Marital status: Married    Spouse name: Cristie Hem   . Number of children: 0  . Years of education: Not on file  . Highest education level: Not on file  Occupational History  . Not on file  Tobacco Use  . Smoking status: Never Smoker  . Smokeless tobacco: Never Used  Vaping Use  . Vaping Use: Never used  Substance and Sexual Activity  . Alcohol use: Yes    Alcohol/week: 4.0 standard drinks    Types: 4 Glasses of wine per week  . Drug use: No  . Sexual activity: Yes    Birth control/protection: I.U.D.  Other Topics Concern  . Not on file  Social History Narrative   Lives at home with her husband and pets   Right handed   Caffeine: 1-2 cups daily   Social Determinants of Health   Financial Resource Strain:   . Difficulty of Paying Living Expenses:   Food Insecurity:   . Worried About Charity fundraiser in the Last Year:   . Arboriculturist in the Last Year:   Transportation Needs:   . Film/video editor (Medical):   [name] Lack of Transportation (Non-Medical):   Physical Activity:   . Days of Exercise per Week:   . Minutes of Exercise per Session:   Stress:   . Feeling of Stress :   Social Connections:   . Frequency of Communication with Friends and Family:   . Frequency of Social Gatherings with Friends and Family:   . Attends Religious Services:   . Active Member of Clubs or Organizations:   . Attends Archivist Meetings:   [name] Marital Status:   Intimate Partner Violence:   . Fear of Current or Ex-Partner:   . Emotionally Abused:   [name] Physically Abused:   . Sexually Abused:     ROS Per hpi  Objective     Vitals as reported by the patient: There were no vitals filed for this visit.  [name] was seen today for sinus congestion.  Diagnoses and all orders for this visit:  Acute upper respiratory infection -     amoxicillin-clavulanate (AUGMENTIN) 875-125 MG tablet; Take 1 tablet by mouth 2 (two) times daily. -     fluticasone (FLONASE) 50 MCG/ACT nasal spray; Place 2 sprays into both nostrils daily.   PLAN  augmentin po bid for 7 days  flonase 2 sprays each nostril once daily  If no improvement by Friday, call  Patient encouraged to call clinic with any questions, comments, or  concerns.  I discussed the assessment and treatment plan with the patient. The patient was provided an opportunity to ask questions and all were answered. The patient agreed with the plan and demonstrated an understanding of the instructions.   The patient was advised to call back or seek an in-person evaluation if the symptoms worsen or if the condition fails to improve as anticipated.  I provided 11 minutes of non-face-to-face time during this encounter.  Maximiano Coss, NP  Primary [facility] at [facility]

## 2019-08-19 NOTE — Patient Instructions (Signed)
° ° ° °  If you have lab work done today you will be contacted with your lab results within the next 2 weeks.  If you have not heard from us then please contact us. The fastest way to get your results is to register for My Chart. ° ° °IF you received an x-ray today, you will receive an invoice from [facility]. Please contact [facility] at [phone] with questions or concerns regarding your invoice.  ° °IF you received labwork today, you will receive an invoice from [facility]. Please contact [facility] at [phone] with questions or concerns regarding your invoice.  ° °Our billing staff will not be able to assist you with questions regarding bills from these companies. ° °You will be contacted with the lab results as soon as they are available. The fastest way to get your results is to activate your My Chart account. Instructions are located on the last page of this paperwork. If you have not heard from us regarding the results in 2 weeks, please contact this office. °  ° ° ° °

## 2019-08-25 HISTORY — PX: HIP SURGERY: SHX245

## 2019-09-04 ENCOUNTER — Telehealth: Payer: Self-pay | Admitting: Family Medicine

## 2019-09-04 NOTE — Telephone Encounter (Signed)
OT called for a Verbal order for 1X1 for 2 weeks OT for more inf called susan @ [phone] please Advice

## 2019-09-04 NOTE — Telephone Encounter (Signed)
This number is not in service call could not be completed unsure what this message is regarding.

## 2019-09-10 NOTE — Telephone Encounter (Signed)
Occupational Therapy called and is needing a verbal order for 1 a week for 2 weeks. Call back number is [phone] this number had a confidentiall vm. Please advise.

## 2019-09-10 NOTE — Telephone Encounter (Signed)
[name] with Occupational Therapy was given Verbal.

## 2019-09-22 ENCOUNTER — Encounter: Payer: Self-pay | Admitting: Gastroenterology

## 2019-09-22 ENCOUNTER — Ambulatory Visit (INDEPENDENT_AMBULATORY_CARE_PROVIDER_SITE_OTHER): Payer: 59 | Admitting: Gastroenterology

## 2019-09-22 ENCOUNTER — Other Ambulatory Visit: Payer: Self-pay

## 2019-09-22 ENCOUNTER — Other Ambulatory Visit (INDEPENDENT_AMBULATORY_CARE_PROVIDER_SITE_OTHER): Payer: 59

## 2019-09-22 VITALS — BP 106/68 | HR 82 | Ht 63.5 in | Wt 148.1 lb

## 2019-09-22 DIAGNOSIS — K921 Melena: Secondary | ICD-10-CM | POA: Diagnosis not present

## 2019-09-22 DIAGNOSIS — K51211 Ulcerative (chronic) proctitis with rectal bleeding: Secondary | ICD-10-CM

## 2019-09-22 LAB — CBC WITH DIFFERENTIAL/PLATELET
Basophils Absolute: 0.1 10*3/uL (ref 0.0–0.1)
Basophils Relative: 0.9 % (ref 0.0–3.0)
Eosinophils Absolute: 0.4 10*3/uL (ref 0.0–0.7)
Eosinophils Relative: 6.6 % — ABNORMAL HIGH (ref 0.0–5.0)
HCT: 37 % (ref 36.0–46.0)
Hemoglobin: 12.6 g/dL (ref 12.0–15.0)
Lymphocytes Relative: 27.8 % (ref 12.0–46.0)
Lymphs Abs: 1.6 10*3/uL (ref 0.7–4.0)
MCHC: 34.1 g/dL (ref 30.0–36.0)
MCV: 94.1 fl (ref 78.0–100.0)
Monocytes Absolute: 0.6 10*3/uL (ref 0.1–1.0)
Monocytes Relative: 10 % (ref 3.0–12.0)
Neutro Abs: 3.2 10*3/uL (ref 1.4–7.7)
Neutrophils Relative %: 54.7 % (ref 43.0–77.0)
Platelets: 401 10*3/uL — ABNORMAL HIGH (ref 150.0–400.0)
RBC: 3.93 Mil/uL (ref 3.87–5.11)
RDW: 13.1 % (ref 11.5–15.5)
WBC: 5.9 10*3/uL (ref 4.0–10.5)

## 2019-09-22 LAB — FERRITIN: Ferritin: 49.7 ng/mL (ref 10.0–291.0)

## 2019-09-22 LAB — IRON: Iron: 120 ug/dL (ref 42–145)

## 2019-09-22 MED ORDER — MESALAMINE 1.2 G PO TBEC
4.8000 g | DELAYED_RELEASE_TABLET | Freq: Every day | ORAL | 3 refills | Status: DC
Start: 2019-09-22 — End: 2020-10-14

## 2019-09-22 NOTE — Patient Instructions (Addendum)
We need to control your colitis! Please start Lialda 4.8 grams daily. You may take all four pills together each day.  You may use the Rowasa enemas as needed for additional symptoms.  Please stop by the lab today so that we can check your iron levels given your ongoing bleeding.   I recommend that you use sunscreen and sun-protective clothing whenever you are outside.  Please see your gynecologist annually for cervical cancer screening.   Keeping your bones healthy is important. I recommend that you take daily calcium and Vitamin D supplements. Weight bearing exercise is also good for your bones.   I recommend that you go to the [facility] for additional information about colitis and achieving good health. It can be a Microbiologist. And also provide information about other medications that we may need to use in the future if the Lialda doesn't get you into remission.   Dr Tarri Glenn recommends you get the Covid vaccine.  Call us to set up a follow up appointment.  I appreciate the opportunity to care for you. [place], MD, MPH

## 2019-09-22 NOTE — Progress Notes (Signed)
[facility]   Referring Provider: Rutherford Guys, MD Primary Care Physician:  Rutherford Guys, MD  Chief complaint:  Ulcerative colitis   IMPRESSION:  Distal ulcerative colitis presenting with rectal bleeding    - diagnosed on colonoscopy 10/22/18    - fecal calprotectin 340 11/11/18, 246 08/05/19 Right lower quadrant with concerns for appendicitis, now resolved    - no source identified on CTAP 04/24/19 Psoas/sciatica pain No known family history of colon cancer or polyps  Ongoing symptoms despite Rowasa enemas. She has clearly not achieved clinical remission given the intermittent bleedinging. Fecal calprotectin has improved but remains elevated at 246. Discussed treatment options. Will start oral 5ASA therapy. Avoiding NSAIDs remains important. Covid vaccine recommended. I stressed the importance for close follow-up.   Screening labs reviewed with the patient as noted below including labs for iron deficiency given the ongoing bleeding.     PLAN: - Fecal calprotectin one week before next office week - Avoid all NSAIDs because these can trigger your colitis - Lialda 4.8g daily (3 month supply with 3 refills) - Rowasa enema PR BID PRN as needed while awaiting clinical response to oral mesalamine - Iron, ferritin, CBC - Crohns and Colitis Foundation website encouraged for additional information - Covid vaccine recommended - Return in 6 weeks, or earlier as needed - Plan colonoscopy when symptoms have controlled  HPI: [name] is a 34 y.o. female with distal ulcerative colitis. Her husband accompanies her to this appointment. She was seen in initial consultation 08/01/2018 after a 40-monthhistory of rectal bleeding and altered bowel habits.  She had a colonoscopy 10/22/2018 that showed proctitis involving the distal 16 cm of the rectum.  Biopsies confirmed inflammatory bowel disease in the rectum.  Biopsies throughout the remainder of the colon were normal.  She was started on Rowasa enemas  with improved in her bleeding and normalization of her bowel frequency.  She was seen in follow-up 04/17/19 when she had stopped Rowasa for 2 months after her spinal fusion when she had difficulty inserting the enemas. She had recurrent bleding and altered bowel habits as well as some pain that had her concerned about appendicitis.  Fecal calprotectin was recommended but not performed. CT abd/pelvis with contrast 04/24/19 was normal except for moderate stool throughout the colon.  She resumed Rowasa enemas. She had cervical fusion 01/2019 with improvement in her migraines. Had difficulty using enemas postoperatively.  [facility]  Has resumed Rowasa BID.  Having 2 good bowel movements daily. Blood in every bowel movement. The volume has decreased but it is in the stool and on the toilet paper with every bowel movement. Appetite good. Unintentional weight gain.  No extra-GI manifestations of IBD except for possible hip pain. Normally bowel frequency is 2-3 BM daily.   She was given a steroid taper after her hip surgery, but didn't notice any change in GI symptoms while on treatment. She is finishing Diclofenac and may then switch to Mobic.   Endoscopic history: Colonoscopy 10/22/18: - Diffuse mild inflammation characterized by erythema, friability and granularity was found in the rectum extending to 16cm from the anal verge.  The remainder of the colon appeared normal. -Rectal biopsies showed moderately active chronic colitis consistent with inflammatory bowel disease.  Other biopsies taken throughout the colon and terminal ileum were normal.  Labs 04/03/17: ALT 18, AST 18, alk phos 96, TB 0.3 Labs 10/22/18: iron 97, ferritin 45.7, WBC 10.7, hgb 13.2, platelets 338  Labs 11/11/18: fecal calprotectin 340, GI pathogen panel negative Labs 06/09/19: normal CMP including ALT 18, AST 17, CRP <1, normal CBC, ESR 6  Health Maintenance:  - DEXA: N/A - Vaccinations:      -  Annual Flu Vaccine  recommend, has not had one the last 2 years      - Pneumococcal Vaccine: N/A      - Covid Vaccine: declines   - Micronutrient eval:       - Annual Vit D, B6, iron panel: Ordered on last visit, not obtained - Annual Pap (if immunosuppressed): By Dr. Philis Pique 02/11/2018 - Surveillance labs for immunomodulators: N/A - Annual depression screening: None  Past Medical History:  Diagnosis Date  . ACL graft tear ([facility]CC)    left knee  . Allergy   . Anxiety   . Complication of anesthesia    woke up during surgery  . Depression   . Family history of adverse reaction to anesthesia     mom is difficult to put to sleep  . IUD (intrauterine device) in place   . Knee pain, right   . Migraine headache   . Sleep paralysis   . Ulcerative proctitis [facility])     Past Surgical History:  Procedure Laterality Date  . ANTERIOR CRUCIATE LIGAMENT REPAIR Left 08/10/2014   Procedure: ANTERIOR CRUCIATE LIGAMENT (ACL) REPAIR;  Surgeon: Sydnee Cabal, MD;  Location: [place];  Service: Orthopedics;  Laterality: Left;  . HIP SURGERY Right 08/25/2019   PAO surgery [facility]  . KNEE ARTHROSCOPY Left 08/10/2014   Procedure: ARTHROSCOPY KNEE DEBRIDEMENT ALLOGRAFT ACL REVISION RECONSTRUCTION;  Surgeon: Sydnee Cabal, MD;  [facility];  Service: Orthopedics;  Laterality: Left;  . KNEE ARTHROSCOPY W/ ACL RECONSTRUCTION Left 2006  . widom tooth extraction     only had 1 wisdom tooth     Current Outpatient Medications  Medication Sig Dispense Refill  . buPROPion (WELLBUTRIN XL) 150 MG 24 hr tablet TAKE 1 TABLET(150 MG) BY MOUTH DAILY 90 tablet 1  . clonazePAM (KLONOPIN) 0.5 MG tablet TAKE 1 TABLET(0.5 MG) BY MOUTH DAILY AS NEEDED FOR ANXIETY 30 tablet 2  . cyclobenzaprine (FLEXERIL) 10 MG tablet TAKE 1 TABLET(10 MG) BY MOUTH AT BEDTIME 30 tablet 3  . DULoxetine (CYMBALTA) 30 MG capsule TAKE 1 CAPSULE(60MG) BY MOUTH DAILY. 30 capsule 1  .  gabapentin (NEURONTIN) 600 MG tablet Take 600 mg by mouth 3 (three) times daily as needed.    . mesalamine (ROWASA) 4 g enema PLACE 1 ENEMA RECTALLY TWICE DAILY 60 enema 1  . SUMAtriptan (IMITREX) 100 MG tablet take 1 tablet by mouth immediately may repeat after 2 hours if needed for MIGRAINE 25 tablet 5  . tiZANidine (ZANAFLEX) 4 MG tablet Take 4 mg by mouth 3 (three) times daily as needed.     No current facility-administered medications for this visit.    Allergies as of 09/22/2019 - Review Complete 09/22/2019  Allergen Reaction Noted  . Molds & smuts Swelling 05/13/2017    Family History  Problem Relation Age of Onset  . Goiter Father   . Hyperlipidemia Father   . Arthritis Mother   . Hypertension Maternal Grandmother   . Alzheimer's disease Maternal Grandmother   . Arthritis Maternal Grandmother   . Stroke Maternal Grandfather   . Hypertension Maternal Grandfather   . Stroke Paternal Grandmother   . Breast cancer Paternal Grandmother   . Migraines Other  on her father's side  . Colon cancer Neg Hx   . Esophageal cancer Neg Hx   . Rectal cancer Neg Hx   . Stomach cancer Neg Hx     Social History   Socioeconomic History  . Marital status: Married    Spouse name: Cristie Hem   . Number of children: 0  . Years of education: Not on file  . Highest education level: Not on file  Occupational History  . Not on file  Tobacco Use  . Smoking status: Never Smoker  . Smokeless tobacco: Never Used  Vaping Use  . Vaping Use: Never used  Substance and Sexual Activity  . Alcohol use: Yes    Alcohol/week: 4.0 standard drinks    Types: 4 Glasses of wine per week  . Drug use: No  . Sexual activity: Yes    Birth control/protection: I.U.D.  Other Topics Concern  . Not on file  Social History Narrative   Lives at home with her husband and pets   Right handed   Caffeine: 1-2 cups daily   Social Determinants of Health   Financial Resource Strain:   . Difficulty of Paying  Living Expenses:   Food Insecurity:   . Worried About Charity fundraiser in the Last Year:   . Arboriculturist in the Last Year:   Transportation Needs:   . Film/video editor (Medical):   [name] Lack of Transportation (Non-Medical):   Physical Activity:   . Days of Exercise per Week:   . Minutes of Exercise per Session:   Stress:   . Feeling of Stress :   Social Connections:   . Frequency of Communication with Friends and Family:   . Frequency of Social Gatherings with Friends and Family:   . Attends Religious Services:   . Active Member of Clubs or Organizations:   . Attends Archivist Meetings:   [name] Marital Status:   Intimate Partner Violence:   . Fear of Current or Ex-Partner:   . Emotionally Abused:   [name] Physically Abused:   . Sexually Abused:    PHYSICAL EXAM: Gen: Awake, alert, and oriented, and well communicative. HEENT: EOMI, non-icteric sclera, NCAT, MMM  Neck: Normal movement of head and neck  Abd: Soft, NT, non-distended, no rebound or guarding, no tympany, normal bowel sounds Pulm: No labored breathing, speaking in full sentences without conversational dyspnea  Derm: No apparent lesions or bruising in visible field  MS: Left leg in a brace. Psych: Pleasant, cooperative, normal speech, thought processing seemingly intact     Arlo Butt L. Tarri Glenn, MD, MPH [facility] 09/22/2019, 11:35 AM

## 2019-09-24 ENCOUNTER — Encounter: Payer: Self-pay | Admitting: Gastroenterology

## 2019-10-07 ENCOUNTER — Other Ambulatory Visit: Payer: Self-pay | Admitting: Family Medicine

## 2019-10-12 ENCOUNTER — Other Ambulatory Visit: Payer: Self-pay | Admitting: Family Medicine

## 2019-10-12 DIAGNOSIS — F411 Generalized anxiety disorder: Secondary | ICD-10-CM

## 2019-10-12 NOTE — Telephone Encounter (Signed)
Requested medication (s) are due for refill today: yes  Requested medication (s) are on the active medication list: yes  Last refill:  03/12/19  Future visit scheduled: no  Notes to clinic:  med not delegated to NT to RF   Requested Prescriptions  Pending Prescriptions Disp Refills   clonazePAM (KLONOPIN) 0.5 MG tablet [Pharmacy Med Name: CLONAZEPAM 0.5MG TABLETS] 30 tablet     Sig: TAKE 1 TABLET(0.5 MG) BY MOUTH DAILY AS NEEDED FOR ANXIETY      Not Delegated - Psychiatry:  Anxiolytics/Hypnotics Failed - 10/12/2019  9:10 AM      Failed - This refill cannot be delegated      Failed - Urine Drug Screen completed in last 360 days.      Passed - Valid encounter within last 6 months    Recent Outpatient Visits           1 month ago Acute upper respiratory infection   [facility] [place], NP   7 months ago GAD (generalized anxiety disorder)   [facility], Lilia Argue, MD   1 year ago Bright red blood per rectum   [facility], Lilia Argue, MD   1 year ago External hemorrhoid   [facility], Lilia Argue, MD   1 year ago External hemorrhoid   [facility], Lilia Argue, MD

## 2019-10-12 NOTE — Telephone Encounter (Signed)
Please schedule appt for f/u on anxiety have not been seen since 03/12/19

## 2019-10-13 NOTE — Telephone Encounter (Signed)
LVMTCB to sch med refill for anxiety

## 2019-10-23 ENCOUNTER — Telehealth: Payer: Self-pay | Admitting: Gastroenterology

## 2019-10-23 ENCOUNTER — Other Ambulatory Visit: Payer: Self-pay | Admitting: Family Medicine

## 2019-10-23 DIAGNOSIS — F411 Generalized anxiety disorder: Secondary | ICD-10-CM

## 2019-10-23 NOTE — Telephone Encounter (Signed)
Requested medication (s) are due for refill today: yes  Requested medication (s) are on the active medication list:yes  Last refill: 03/12/19  Future visit scheduled:No last OV 03/12/19  Notes to clinic:  Not delegated patient needs appointment    Requested Prescriptions  Pending Prescriptions Disp Refills   clonazePAM (KLONOPIN) 0.5 MG tablet [Pharmacy Med Name: CLONAZEPAM 0.5MG TABLETS] 30 tablet     Sig: TAKE 1 TABLET(0.5 MG) BY MOUTH DAILY AS NEEDED FOR ANXIETY      Not Delegated - Psychiatry:  Anxiolytics/Hypnotics Failed - 10/23/2019  4:44 PM      Failed - This refill cannot be delegated      Failed - Urine Drug Screen completed in last 360 days.      Passed - Valid encounter within last 6 months    Recent Outpatient Visits           2 months ago Acute upper respiratory infection   [facility] [place], NP   7 months ago GAD (generalized anxiety disorder)   [facility], Lilia Argue, MD   1 year ago Bright red blood per rectum   [facility], Lilia Argue, MD   1 year ago External hemorrhoid   [facility], Lilia Argue, MD   1 year ago External hemorrhoid   [facility], Lilia Argue, MD

## 2019-10-23 NOTE — Telephone Encounter (Signed)
Patient is requesting a refill of the following medications: Requested Prescriptions   Pending Prescriptions Disp Refills   clonazePAM (KLONOPIN) 0.5 MG tablet [Pharmacy Med Name: CLONAZEPAM 0.5MG TABLETS] 30 tablet     Sig: TAKE 1 TABLET(0.5 MG) BY MOUTH DAILY AS NEEDED FOR ANXIETY    Date of patient request: 10/23/2019 Last office visit: 08/19/2019 Date of last refill: 03/12/2019 Last refill amount: 30

## 2019-10-23 NOTE — Telephone Encounter (Signed)
See note below and advise. 

## 2019-10-23 NOTE — Telephone Encounter (Signed)
Getting the vaccine is recommended. However, we do not have any data to support one vaccine over the other in the setting of ulcerative colitis. Thank you.

## 2019-10-26 NOTE — Telephone Encounter (Signed)
PT needs to have appt for additional refills.

## 2019-10-26 NOTE — Telephone Encounter (Signed)
pmp reviewd, appropriate meds refilled

## 2019-10-26 NOTE — Telephone Encounter (Signed)
Patient last seen in June 2021

## 2019-10-26 NOTE — Telephone Encounter (Signed)
Called pt. W/VM to call back for appt necessary for refill

## 2019-10-26 NOTE — Telephone Encounter (Signed)
Left detailed message for pt on voicemail.

## 2019-11-10 ENCOUNTER — Other Ambulatory Visit: Payer: Self-pay | Admitting: Family Medicine

## 2020-01-13 IMAGING — MR MR HIP*R* W/CM
6 series · 38 of 40 positions shown · IV contrast (agent unspecified)
Comparison: Right hip x-rays dated October 09, 2017.

CLINICAL DATA: Chronic right groin pain.  Evaluate for labral tear.

EXAM:
MRI OF THE RIGHT HIP WITH CONTRAST (MR ARTHROGRAM)
TECHNIQUE: Multiplanar, multisequence MR imaging was performed following the
administration of intra-articular contrast.
CONTRAST:  See injection documentation.

[Series 2: T2 fat-sat · coronal · 4.0mm · 1.12mm/px · 6 of 18 slices shown (1 of 2)]
[im 1/18]
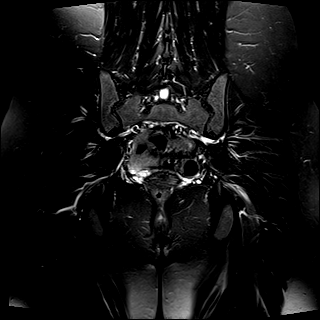
[im 4/18]
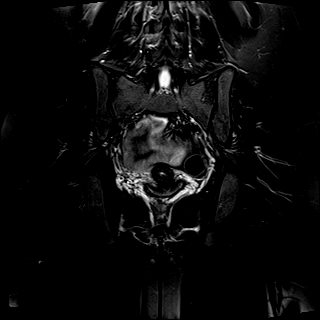
[im 7/18]
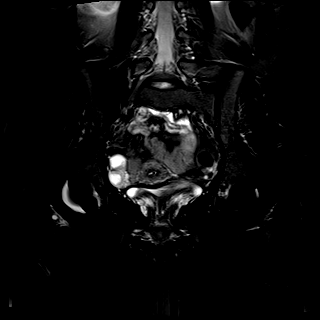
[im 11/18]
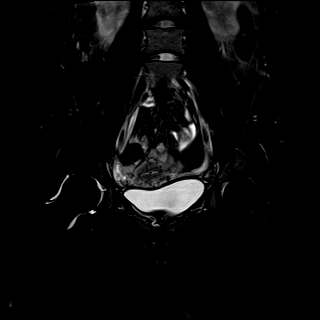
[im 14/18]
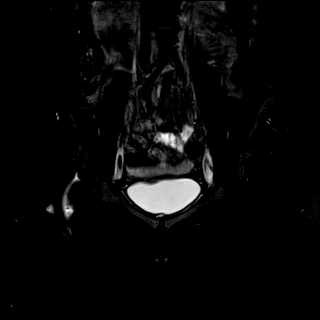
[im 18/18]
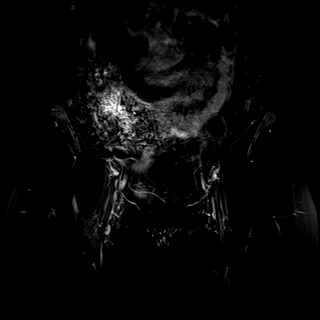

[Series 3: T1 · coronal · 4.0mm · 1.12mm/px · 6 of 18 slices shown]
[im 1/18]
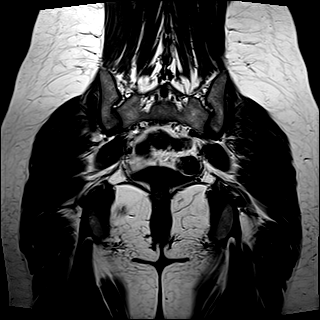
[im 4/18]
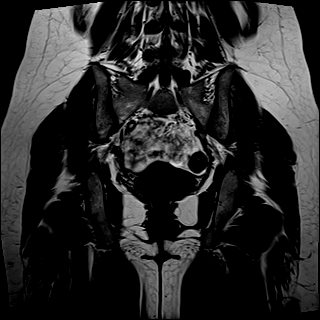
[im 7/18]
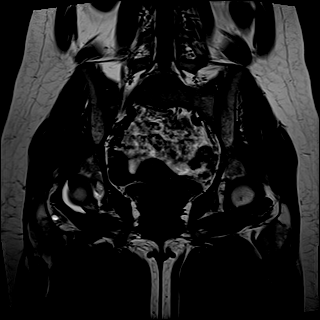
[im 11/18]
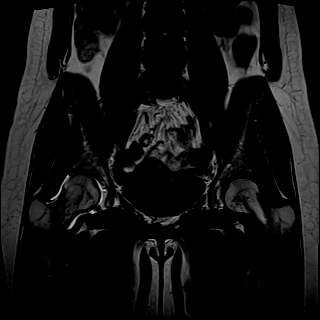
[im 14/18]
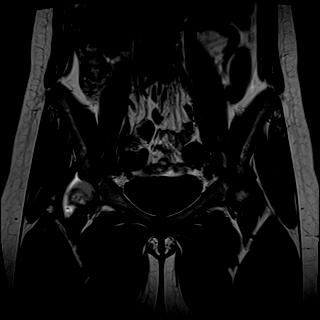
[im 18/18]
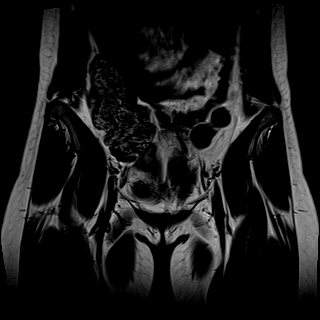

[Series 4: T1 fat-sat · oblique · 4.0mm · 0.70mm/px · 6 of 18 slices shown (1 of 3)]
[im 1/18]
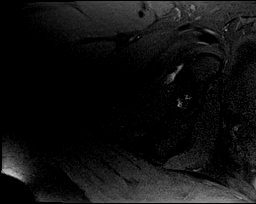
[im 4/18]
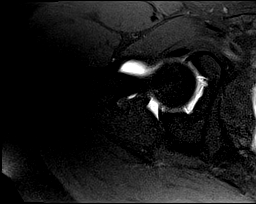
[im 7/18]
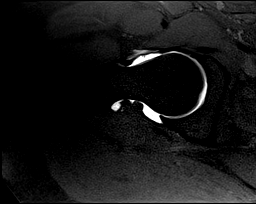
[im 11/18]
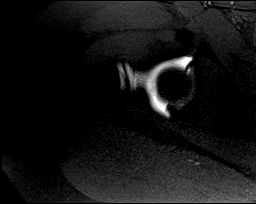
[im 14/18]
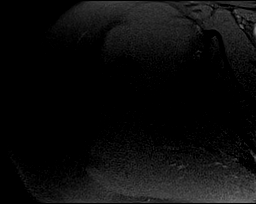
[im 18/18]
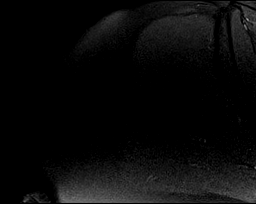

[Series 5: T1 fat-sat · coronal · 4.0mm · 0.56mm/px · 6 of 18 slices shown (2 of 3)]
[im 1/18]
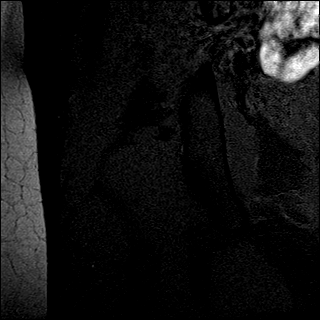
[im 4/18]
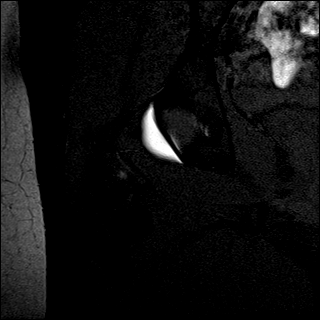
[im 7/18]
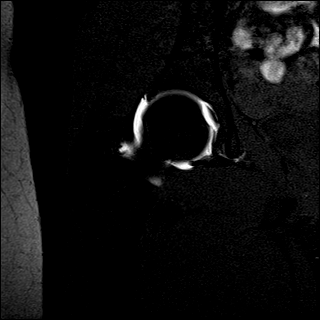
[im 11/18]
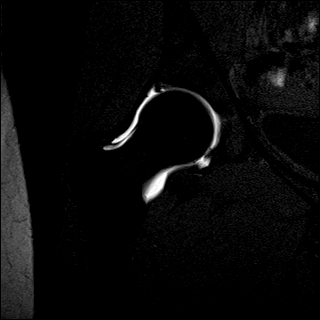
[im 14/18]
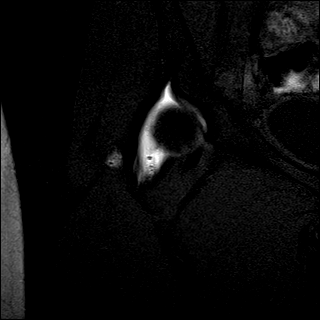
[im 18/18]
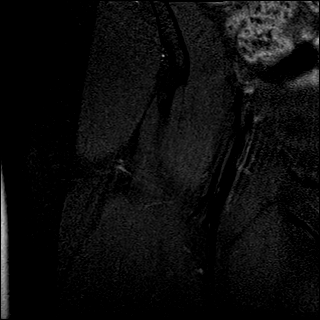

[Series 6: T2 fat-sat · coronal · 5.0mm · 1.12mm/px · 7 of 23 slices shown (2 of 2)]
[im 1/23]
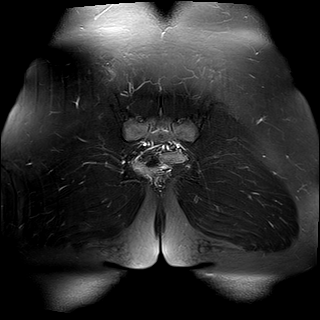
[im 4/23]
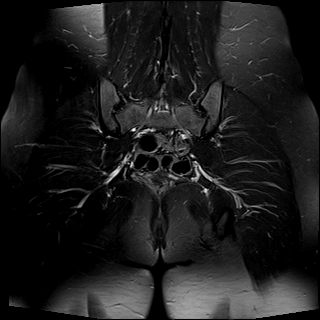
[im 8/23]
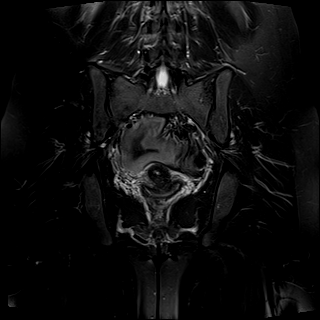
[im 12/23]
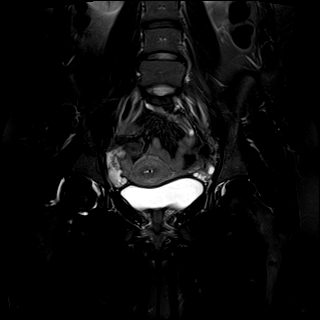
[im 15/23]
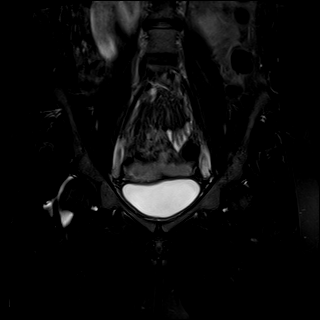
[im 19/23]
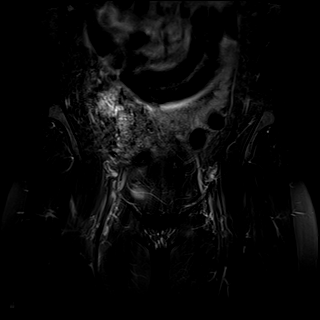
[im 23/23]
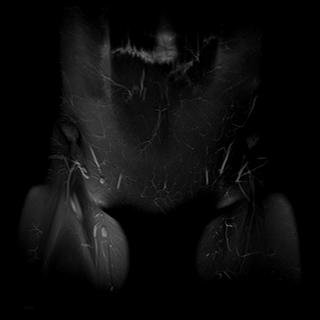

[Series 7: T1 fat-sat · sagittal · 4.0mm · 0.35mm/px · 7 of 28 slices shown (3 of 3)]
[im 1/28]
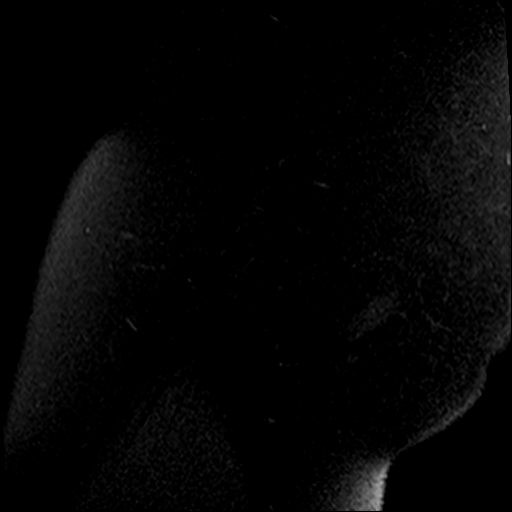
[im 4/28]
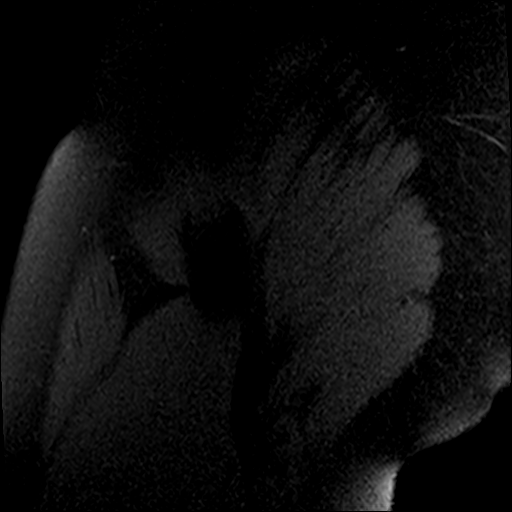
[im 7/28]
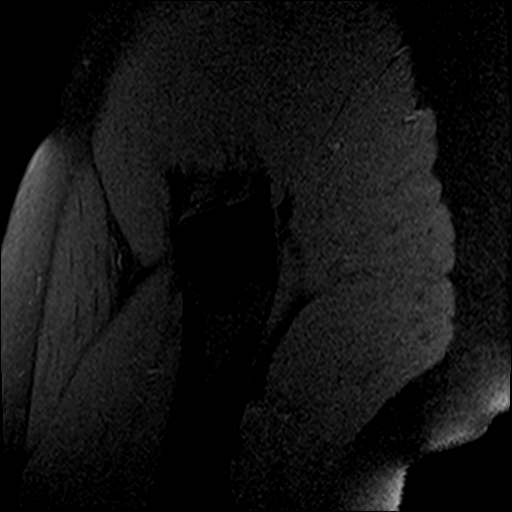
[im 11/28]
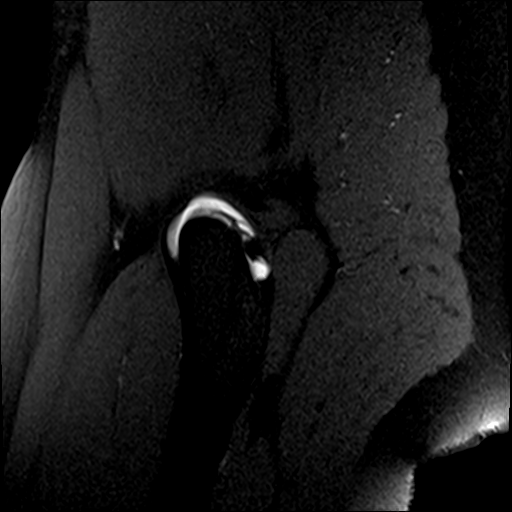
[im 17/28]
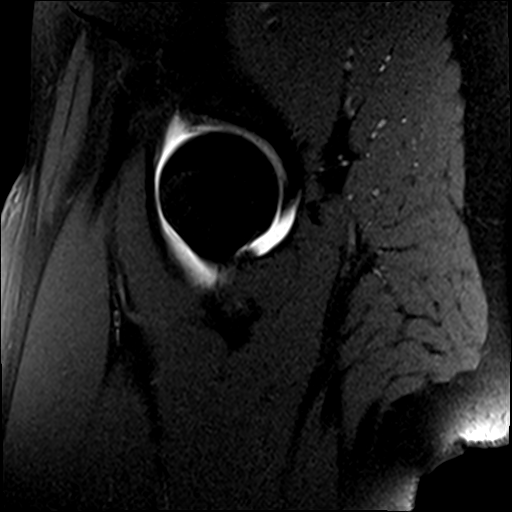
[im 21/28]
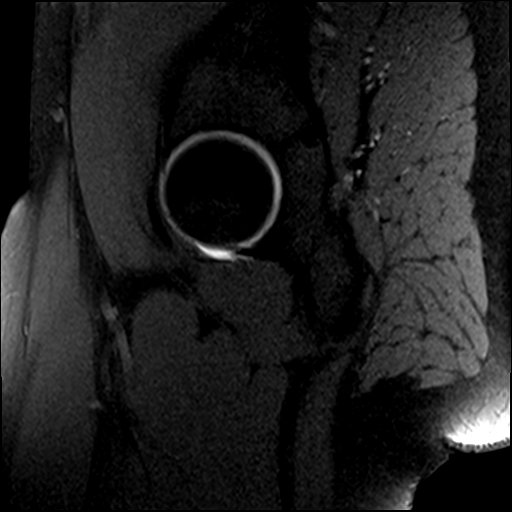
[im 24/28]
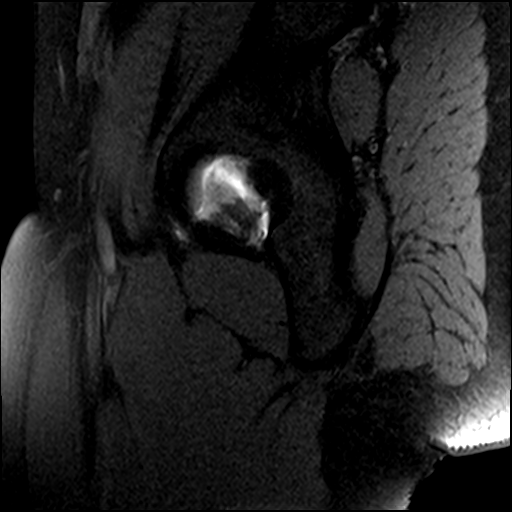

[38 of 40 positions shown; findings below may reference images not displayed]

FINDINGS: Bones: There is no evidence of acute fracture, dislocation or
avascular necrosis. The visualized bony pelvis appears normal. The
visualized sacroiliac joints and symphysis pubis appear normal.

Articular cartilage and labrum

Articular cartilage: No focal chondral defect or subchondral signal
abnormality identified.

Labrum: Abnormal contrast signal extending into the anterior labrum.
Degeneration and fraying of the anterior superior and superior
labrum. No paralabral cyst.

Joint or bursal effusion

Joint effusion: The right hip joint is distended with
intra-articular contrast. No left hip joint effusion.

Bursae: No focal periarticular fluid collection.

Muscles and tendons

Muscles and tendons: The visualized sartorius, rectus femoris,
gluteus, hamstring and iliopsoas tendons appear normal. No muscle
edema or atrophy.

Other findings

Miscellaneous: IUD within the endometrial canal. The visualized
internal pelvic contents appear unremarkable.
IMPRESSION: 1. Anterior labral tear. Degeneration and fraying of the anterior
superior and superior labrum.

## 2020-01-19 ENCOUNTER — Telehealth: Payer: Self-pay | Admitting: *Deleted

## 2020-01-19 NOTE — Telephone Encounter (Signed)
Patient need to schedule an appointment for refill.

## 2020-01-19 NOTE — Telephone Encounter (Signed)
Called pt and sch appt for 11/24 with NP Orland Mustard

## 2020-01-20 ENCOUNTER — Other Ambulatory Visit: Payer: Self-pay

## 2020-01-20 ENCOUNTER — Encounter: Payer: Self-pay | Admitting: Registered Nurse

## 2020-01-20 ENCOUNTER — Ambulatory Visit (INDEPENDENT_AMBULATORY_CARE_PROVIDER_SITE_OTHER): Payer: 59 | Admitting: Registered Nurse

## 2020-01-20 VITALS — BP 128/74 | HR 97 | Temp 98.6°F | Ht 63.0 in | Wt 149.0 lb

## 2020-01-20 DIAGNOSIS — M542 Cervicalgia: Secondary | ICD-10-CM | POA: Diagnosis not present

## 2020-01-20 DIAGNOSIS — F988 Other specified behavioral and emotional disorders with onset usually occurring in childhood and adolescence: Secondary | ICD-10-CM | POA: Diagnosis not present

## 2020-01-20 DIAGNOSIS — F411 Generalized anxiety disorder: Secondary | ICD-10-CM

## 2020-01-20 MED ORDER — CLONAZEPAM 0.5 MG PO TABS: 0.5000 mg | ORAL_TABLET | Freq: Every day | ORAL | 1 refills | Status: DC | PRN

## 2020-01-20 MED ORDER — CYCLOBENZAPRINE HCL 10 MG PO TABS: ORAL_TABLET | ORAL | 3 refills | Status: DC

## 2020-01-20 MED ORDER — AMPHETAMINE-DEXTROAMPHETAMINE 10 MG PO TABS: 10.0000 mg | ORAL_TABLET | Freq: Every day | ORAL | 0 refills | Status: DC

## 2020-01-20 NOTE — Patient Instructions (Signed)
° ° ° °  If you have lab work done today you will be contacted with your lab results within the next 2 weeks.  If you have not heard from us then please contact us. The fastest way to get your results is to register for My Chart. ° ° °IF you received an x-ray today, you will receive an invoice from [facility]. Please contact [facility] at [phone] with questions or concerns regarding your invoice.  ° °IF you received labwork today, you will receive an invoice from [facility]. Please contact [facility] at [phone] with questions or concerns regarding your invoice.  ° °Our billing staff will not be able to assist you with questions regarding bills from these companies. ° °You will be contacted with the lab results as soon as they are available. The fastest way to get your results is to activate your My Chart account. Instructions are located on the last page of this paperwork. If you have not heard from us regarding the results in 2 weeks, please contact this office. °  ° ° ° °

## 2020-01-25 ENCOUNTER — Telehealth: Payer: Self-pay | Admitting: *Deleted

## 2020-01-25 NOTE — Telephone Encounter (Signed)
On 01/19/2020, faxed denied Rx request for Clonazepam to [facility]. Patient needs to schedule TOC appointment.

## 2020-02-02 ENCOUNTER — Telehealth: Payer: Self-pay | Admitting: Registered Nurse

## 2020-02-02 NOTE — Telephone Encounter (Signed)
Ebony from [facility] called with question about Rx for   amphetamine-dextroamphetamine (ADDERALL) 10 MG tablet [[id number]]   Rx sent 11/24.  Insurance only covers 1 month or 3 month supply. Rx written for 2 months. Please advise at [phone]  [facility] [phone] - [place], [place] - [address]  [address] [place] [address]  Phone:  [phone] Fax:  [phone]  DEA #:  [id number]

## 2020-02-03 ENCOUNTER — Other Ambulatory Visit: Payer: Self-pay | Admitting: Registered Nurse

## 2020-02-03 DIAGNOSIS — F988 Other specified behavioral and emotional disorders with onset usually occurring in childhood and adolescence: Secondary | ICD-10-CM

## 2020-02-03 MED ORDER — AMPHETAMINE-DEXTROAMPHETAMINE 10 MG PO TABS: 10.0000 mg | ORAL_TABLET | Freq: Every day | ORAL | 0 refills | Status: DC

## 2020-02-03 NOTE — Telephone Encounter (Signed)
Rx for 2 month supply ins covers 1 month or 3 months can you change this medication

## 2020-02-03 NOTE — Telephone Encounter (Signed)
Sent Thanks Rich

## 2020-02-09 ENCOUNTER — Encounter: Payer: Self-pay | Admitting: *Deleted

## 2020-02-10 ENCOUNTER — Encounter: Payer: Self-pay | Admitting: *Deleted

## 2020-03-07 ENCOUNTER — Ambulatory Visit: Payer: 59 | Admitting: Registered Nurse

## 2020-03-07 ENCOUNTER — Other Ambulatory Visit: Payer: Self-pay | Admitting: Registered Nurse

## 2020-03-07 DIAGNOSIS — F988 Other specified behavioral and emotional disorders with onset usually occurring in childhood and adolescence: Secondary | ICD-10-CM

## 2020-03-07 DIAGNOSIS — F411 Generalized anxiety disorder: Secondary | ICD-10-CM

## 2020-03-07 NOTE — Telephone Encounter (Signed)
Pt called and stated the adderal medication her insurance only overs 30 day table quantity and not 90 like it was sent in so she is needing a new Rx sent to the pharmacy so her insurance will over it. Please advise.

## 2020-03-09 ENCOUNTER — Other Ambulatory Visit: Payer: Self-pay | Admitting: Registered Nurse

## 2020-03-09 NOTE — Telephone Encounter (Signed)
03/09/2020 - PATIENT WANTS TO KNOW WHY WE HAVE NOT RESPONDED TO HER PHARMACY YET REGARDING THE ADDERALL. SHE ALSO IS HAVING A PROBLEM WITH GETTING HER 2 DEPRESSION MEDICATIONS REFILLED. SHE WAS TOLD IT WAS DENIED BY THE PROVIDER. SHE WOULD LIKE A CALL BACK AS SOON AS POSSIBLE. THE PHARMACIST TOLD HER WE WILL NOT RESPOND TO THEIR REQUEST.  BEST PHONE [phone] (CELL) [place]

## 2020-03-11 MED ORDER — CLONAZEPAM 0.5 MG PO TABS: 0.5000 mg | ORAL_TABLET | Freq: Every day | ORAL | 0 refills | Status: DC | PRN

## 2020-03-11 MED ORDER — AMPHETAMINE-DEXTROAMPHETAMINE 10 MG PO TABS: 10.0000 mg | ORAL_TABLET | Freq: Every day | ORAL | 0 refills | Status: DC

## 2020-03-25 ENCOUNTER — Telehealth: Payer: Self-pay

## 2020-03-25 NOTE — Telephone Encounter (Signed)
Transition Care Management Follow-up Telephone Call  Date of discharge and from where: 03/24/2020 from [facility]  How have you been since you were released from the hospital? Pt is doing okay but she is in a lot of pain.   Any questions or concerns? No  Items Reviewed:  Did the pt receive and understand the discharge instructions provided? Yes   Medications obtained and verified? Yes   Other? No   Any new allergies since your discharge? No   Dietary orders reviewed? N/A  Do you have support at home? Yes   Functional Questionnaire: (I = Independent and D = Dependent) ADLs: I - bilateral hip surgery. Patient pain interfers with some ADL's but is able to do a lot on her on.   Bathing/Dressing- I  Meal Prep- I  Eating- I  Maintaining continence- I  Transferring/Ambulation- I  Managing Meds- I   Follow up appointments reviewed:   PCP Hospital f/u appt confirmed? No    Specialist Hospital f/u appt confirmed? Yes  Scheduled to see Atilano Ina, MD on 03/31/20 @ 3:00pm.  Are transportation arrangements needed? No   If their condition worsens, is the pt aware to call PCP or go to the Emergency Dept.? Yes  Was the patient provided with contact information for the PCP's office or ED? Yes  Was to pt encouraged to call back with questions or concerns? Yes

## 2020-03-28 ENCOUNTER — Encounter: Payer: Self-pay | Admitting: Registered Nurse

## 2020-03-28 NOTE — Progress Notes (Signed)
Established Patient Office Visit  Subjective:  Patient ID: [name], female    DOB: Jul 17, 1985  Age: 35 y.o. MRN: [id number]  CC:  Chief Complaint  Patient presents with  . Medication Refill    controlled meds   . Depression    possible adhd meds as prn?    HPI [name] presents for med refills  ADHD Has been on adderall 68m PO qd in the past Been a while since she has had this Wants to resume as she feels like not having had it is affecting her work  Anxiety Klonopin 0.587mPO qd PRN Has been on wellbutrin and cymbalta as well Regimen works well, no concerns, hopes to continue  Muscle spasm c spine. Recent fusion Unfortunately ongoing muscle tightness Does not need analgesics but would like flexeril  Otherwise no concerns. Has another upcoming surgery on her neck.   Past Medical History:  Diagnosis Date  . ACL graft tear (HCC)    left knee  . Allergy   . Anxiety   . Complication of anesthesia    woke up during surgery  . Depression   . Family history of adverse reaction to anesthesia     mom is difficult to put to sleep  . IUD (intrauterine device) in place   . Knee pain, right   . Migraine headache   . Sleep paralysis   . [facility]    Past Surgical History:  Procedure Laterality Date  . ANTERIOR CRUCIATE LIGAMENT REPAIR Left 08/10/2014   Procedure: ANTERIOR CRUCIATE LIGAMENT (ACL) REPAIR;  Surgeon: RoSydnee CabalMD;  [place] [facility] [place]: [place];  Laterality: Left;  . HIP SURGERY Right 08/25/2019   PAO surgery [facility]. KNEE ARTHROSCOPY Left 08/10/2014   Procedure: ARTHROSCOPY KNEE DEBRIDEMENT ALLOGRAFT ACL REVISION RECONSTRUCTION;  Surgeon: RoSydnee CabalMD;  [facility]: [facility];  Laterality: Left;  . KNEE ARTHROSCOPY W/ ACL RECONSTRUCTION Left 2006  . widom tooth extraction     only had 1 wisdom tooth     Family History  Problem  Relation Age of Onset  . Goiter Father   . Hyperlipidemia Father   . Arthritis Mother   . Hypertension Maternal Grandmother   . Alzheimer's disease Maternal Grandmother   . Arthritis Maternal Grandmother   . Stroke Maternal Grandfather   . Hypertension Maternal Grandfather   . Stroke Paternal Grandmother   . Breast cancer Paternal Grandmother   . Migraines Other        on her father's side  . Colon cancer Neg Hx   . Esophageal cancer Neg Hx   . Rectal cancer Neg Hx   . Stomach cancer Neg Hx     Social History   Socioeconomic History  . Marital status: Married    Spouse name: [name] . Number of children: 0  . Years of education: Not on file  . Highest education level: Not on file  Occupational History  . Not on file  Tobacco Use  . Smoking status: Never Smoker  . Smokeless tobacco: Never Used  Vaping Use  . Vaping Use: Never used  Substance and Sexual Activity  . Alcohol use: Yes    Alcohol/week: 4.0 standard drinks    Types: 4 Glasses of wine per week  . Drug use: No  . Sexual activity: Yes    Birth control/protection: I.U.D.  Other Topics Concern  . Not on file  Social History Narrative   Lives at home with her husband and pets   Right handed   Caffeine: 1-2 cups daily   Social Determinants of Health   Financial Resource Strain: Not on file  Food Insecurity: Not on file  Transportation Needs: Not on file  Physical Activity: Not on file  Stress: Not on file  Social Connections: Not on file  Intimate Partner Violence: Not on file    Outpatient Medications Prior to Visit  Medication Sig Dispense Refill  . gabapentin (NEURONTIN) 300 MG capsule Take 600 mg by mouth 2 (two) times daily.     [name] HYDROcodone-acetaminophen (NORCO/VICODIN) 5-325 MG tablet Take 1 tablet by mouth 3 (three) times daily as needed.    . mesalamine (LIALDA) 1.2 g EC tablet Take 4 tablets (4.8 g total) by mouth daily with breakfast. 360 tablet 3  . SUMAtriptan (IMITREX) 100 MG tablet  take 1 tablet by mouth immediately may repeat after 2 hours if needed for MIGRAINE 25 tablet 5  . tiZANidine (ZANAFLEX) 4 MG tablet Take 4 mg by mouth 3 (three) times daily as needed.    [name] buPROPion (WELLBUTRIN XL) 150 MG 24 hr tablet TAKE 1 TABLET(150 MG) BY MOUTH DAILY 90 tablet 0  . clonazePAM (KLONOPIN) 0.5 MG tablet TAKE 1 TABLET(0.5 MG) BY MOUTH DAILY AS NEEDED FOR ANXIETY 30 tablet 0  . cyclobenzaprine (FLEXERIL) 10 MG tablet TAKE 1 TABLET(10 MG) BY MOUTH AT BEDTIME 30 tablet 3  . DULoxetine (CYMBALTA) 30 MG capsule TAKE 1 CAPSULE(60MG) BY MOUTH DAILY. 90 capsule 0  . gabapentin (NEURONTIN) 600 MG tablet Take 600 mg by mouth 3 (three) times daily as needed.    . mesalamine (ROWASA) 4 g enema PLACE 1 ENEMA RECTALLY TWICE DAILY 60 enema 1   No facility-administered medications prior to visit.    Allergies  Allergen Reactions  . Molds & Smuts Swelling    ROS Review of Systems  Constitutional: Negative.   HENT: Negative.   Eyes: Negative.   Respiratory: Negative.   Cardiovascular: Negative.   Gastrointestinal: Negative.   Genitourinary: Negative.   Musculoskeletal: Negative.   Skin: Negative.   Neurological: Negative.   Psychiatric/Behavioral: Negative.   All other systems reviewed and are negative.     Objective:    Physical Exam Vitals and nursing note reviewed.  Constitutional:      General: She is not in acute distress.    Appearance: Normal appearance. She is normal weight. She is not ill-appearing, toxic-appearing or diaphoretic.  Cardiovascular:     Rate and Rhythm: Normal rate and regular rhythm.     Heart sounds: Normal heart sounds. No murmur heard. No friction rub. No gallop.   Pulmonary:     Effort: Pulmonary effort is normal. No respiratory distress.     Breath sounds: Normal breath sounds. No stridor. No wheezing, rhonchi or rales.  Chest:     Chest wall: No tenderness.  Musculoskeletal:        General: Tenderness present. Normal range of motion.      Right lower leg: No edema.     Left lower leg: No edema.  Skin:    General: Skin is warm and dry.  Neurological:     General: No focal deficit present.     Mental Status: She is alert and oriented to person, place, and time. Mental status is at baseline.  Psychiatric:        Mood and Affect: Mood normal.  Behavior: Behavior normal.        Thought Content: Thought content normal.        Judgment: Judgment normal.     BP 128/74   Pulse 97   Temp 98.6 F (37 C) (Temporal)   Ht 5' 3"  (1.6 m)   Wt 149 lb (67.6 kg)   SpO2 97%   BMI 26.39 kg/m  Wt Readings from Last 3 Encounters:  01/20/20 149 lb (67.6 kg)  09/22/19 148 lb 2 oz (67.2 kg)  11/28/18 134 lb 3.2 oz (60.9 kg)     Health Maintenance Due  Topic Date Due  . Hepatitis C Screening  Never done  . COVID-19 Vaccine (1) Never done  . TETANUS/TDAP  Never done  . INFLUENZA VACCINE  09/27/2019    There are no preventive care reminders to display for this patient.  Lab Results  Component Value Date   TSH 3.070 04/03/2017   Lab Results  Component Value Date   WBC 5.9 09/22/2019   HGB 12.6 09/22/2019   HCT 37.0 09/22/2019   MCV 94.1 09/22/2019   PLT 401.0 (H) 09/22/2019   Lab Results  Component Value Date   NA 137 06/09/2019   K 3.8 06/09/2019   CO2 28 06/09/2019   GLUCOSE 87 06/09/2019   BUN 13 06/09/2019   CREATININE 0.62 06/09/2019   BILITOT 0.5 06/09/2019   ALKPHOS 81 06/09/2019   AST 17 06/09/2019   ALT 18 06/09/2019   PROT 7.1 06/09/2019   ALBUMIN 4.6 06/09/2019   CALCIUM 9.3 06/09/2019   GFR 109.41 06/09/2019   Lab Results  Component Value Date   CHOL 205 (H) 06/10/2015   Lab Results  Component Value Date   HDL 71 06/10/2015   Lab Results  Component Value Date   LDLCALC 119 06/10/2015   Lab Results  Component Value Date   TRIG 75 06/10/2015   Lab Results  Component Value Date   CHOLHDL 2.9 06/10/2015   No results found for: HGBA1C    Assessment & Plan:   Problem List  Items Addressed This Visit      Other   GAD (generalized anxiety disorder)    Other Visit Diagnoses    Attention deficit disorder, unspecified hyperactivity presence    -  Primary   Cervical pain (neck)       Relevant Medications   cyclobenzaprine (FLEXERIL) 10 MG tablet      Meds ordered this encounter  Medications  . DISCONTD: clonazePAM (KLONOPIN) 0.5 MG tablet    Sig: Take 1 tablet (0.5 mg total) by mouth daily as needed for anxiety.    Dispense:  30 tablet    Refill:  1  . cyclobenzaprine (FLEXERIL) 10 MG tablet    Sig: TAKE 1 TABLET(10 MG) BY MOUTH AT BEDTIME    Dispense:  30 tablet    Refill:  3  . DISCONTD: amphetamine-dextroamphetamine (ADDERALL) 10 MG tablet    Sig: Take 1 tablet (10 mg total) by mouth daily with breakfast.    Dispense:  60 tablet    Refill:  0    Order Specific Question:   Supervising Provider    Answer:   Carlota Raspberry, JEFFREY R [[id number]]    Follow-up: No follow-ups on file.   PLAN  meds as above  Discussed r/b/se of each  Return in 4-8 weeks for med refill  pdmp consulted, no concerns.  Patient encouraged to call clinic with any questions, comments, or concerns.  Maximiano Coss, NP

## 2020-04-02 ENCOUNTER — Ambulatory Visit: Payer: Self-pay

## 2020-04-02 ENCOUNTER — Ambulatory Visit (HOSPITAL_COMMUNITY)
Admission: EM | Admit: 2020-04-02 | Discharge: 2020-04-02 | Disposition: A | Discharge status: Home / Self Care | Payer: 59 | Attending: Urgent Care | Admitting: Urgent Care

## 2020-04-02 ENCOUNTER — Other Ambulatory Visit: Payer: Self-pay

## 2020-04-02 ENCOUNTER — Encounter (HOSPITAL_COMMUNITY): Payer: Self-pay | Admitting: *Deleted

## 2020-04-02 DIAGNOSIS — L299 Pruritus, unspecified: Secondary | ICD-10-CM

## 2020-04-02 DIAGNOSIS — T148XXA Other injury of unspecified body region, initial encounter: Secondary | ICD-10-CM

## 2020-04-02 DIAGNOSIS — L089 Local infection of the skin and subcutaneous tissue, unspecified: Secondary | ICD-10-CM | POA: Diagnosis not present

## 2020-04-02 DIAGNOSIS — Z9889 Other specified postprocedural states: Secondary | ICD-10-CM

## 2020-04-02 MED ORDER — TRIAMCINOLONE ACETONIDE 0.1 % EX CREA: 1.0000 "application " | TOPICAL_CREAM | Freq: Two times a day (BID) | CUTANEOUS | 0 refills | Status: DC

## 2020-04-02 MED ORDER — DOXYCYCLINE HYCLATE 100 MG PO CAPS: 100.0000 mg | ORAL_CAPSULE | Freq: Two times a day (BID) | ORAL | 0 refills | Status: DC

## 2020-04-02 MED ORDER — HYDROXYZINE HCL 50 MG PO TABS: 50.0000 mg | ORAL_TABLET | Freq: Three times a day (TID) | ORAL | 0 refills | Status: DC | PRN

## 2020-04-02 NOTE — ED Provider Notes (Signed)
[facility]   MRN: [id number] DOB: 1985-03-25  Subjective:   [name] is a 35 y.o. female presenting for acute onset persistent left-sided thigh/hip pain.  Patient had left hip surgery of the hip, femoral acetabular impingement, left hip dysplasia.  She has been itching and feeling burning sensations about the wound.  She has concerns about wound dehiscence.  She contacted her regular doctor and had a video visit done.  They prescribed a prednisone course in the event that she was having an allergic reaction but advised that she come into get evaluated.  Denies facial oral swelling, chest tightness, shortness of breath, wheezing, hives.  She has been using Benadryl without relief.  She is using diclofenac for pain and inflammation, also takes mesalamine.  Has not had follow-up with her surgeon.  No current facility-administered medications for this encounter.  Current Outpatient Medications:  .  amphetamine-dextroamphetamine (ADDERALL) 10 MG tablet, Take 1 tablet (10 mg total) by mouth daily with breakfast., Disp: 30 tablet, Rfl: 0 .  buPROPion (WELLBUTRIN XL) 150 MG 24 hr tablet, TAKE 1 TABLET(150 MG) BY MOUTH DAILY, Disp: 90 tablet, Rfl: 0 .  clonazePAM (KLONOPIN) 0.5 MG tablet, Take 1 tablet (0.5 mg total) by mouth daily as needed for anxiety., Disp: 30 tablet, Rfl: 0 .  cyclobenzaprine (FLEXERIL) 10 MG tablet, TAKE 1 TABLET(10 MG) BY MOUTH AT BEDTIME, Disp: 30 tablet, Rfl: 3 .  DULoxetine (CYMBALTA) 30 MG capsule, TAKE 1 CAPSULE BY MOUTH EVERY DAY, Disp: 90 capsule, Rfl: 0 .  gabapentin (NEURONTIN) 300 MG capsule, Take 600 mg by mouth 2 (two) times daily. , Disp: , Rfl:  .  HYDROcodone-acetaminophen (NORCO/VICODIN) 5-325 MG tablet, Take 1 tablet by mouth 3 (three) times daily as needed., Disp: , Rfl:  .  mesalamine (LIALDA) 1.2 g EC tablet, Take 4 tablets (4.8 g total) by mouth daily with breakfast., Disp: 360 tablet, Rfl: 3 .  SUMAtriptan (IMITREX) 100 MG tablet,  take 1 tablet by mouth immediately may repeat after 2 hours if needed for MIGRAINE, Disp: 25 tablet, Rfl: 5 .  tiZANidine (ZANAFLEX) 4 MG tablet, Take 4 mg by mouth 3 (three) times daily as needed., Disp: , Rfl:    Allergies  Allergen Reactions  . Molds & Smuts Swelling    Past Medical History:  Diagnosis Date  . ACL graft tear (HCC)    left knee  . Allergy   . Anxiety   . Complication of anesthesia    woke up during surgery  . Depression   . Family history of adverse reaction to anesthesia     mom is difficult to put to sleep  . IUD (intrauterine device) in place   . Knee pain, right   . Migraine headache   . Sleep paralysis   . Ulcerative proctitis [facility])      Past Surgical History:  Procedure Laterality Date  . ANTERIOR CRUCIATE LIGAMENT REPAIR Left 08/10/2014   Procedure: ANTERIOR CRUCIATE LIGAMENT (ACL) REPAIR;  Surgeon: Sydnee Cabal, MD;  Location: [place];  Service: Orthopedics;  Laterality: Left;  . HIP SURGERY Right 08/25/2019   PAO surgery [facility]  . KNEE ARTHROSCOPY Left 08/10/2014   Procedure: ARTHROSCOPY KNEE DEBRIDEMENT ALLOGRAFT ACL REVISION RECONSTRUCTION;  Surgeon: Sydnee Cabal, MD;  [facility];  Service: Orthopedics;  Laterality: Left;  . KNEE ARTHROSCOPY W/ ACL RECONSTRUCTION Left 2006  . widom tooth extraction     only had 1 wisdom tooth  Family History  Problem Relation Age of Onset  . Goiter Father   . Hyperlipidemia Father   . Arthritis Mother   . Hypertension Maternal Grandmother   . Alzheimer's disease Maternal Grandmother   . Arthritis Maternal Grandmother   . Stroke Maternal Grandfather   . Hypertension Maternal Grandfather   . Stroke Paternal Grandmother   . Breast cancer Paternal Grandmother   . Migraines Other        on her father's side  . Colon cancer Neg Hx   . Esophageal cancer Neg Hx   . Rectal cancer Neg Hx   . Stomach cancer Neg Hx     Social History   Tobacco  Use  . Smoking status: Never Smoker  . Smokeless tobacco: Never Used  Vaping Use  . Vaping Use: Never used  Substance Use Topics  . Alcohol use: Yes    Alcohol/week: 4.0 standard drinks    Types: 4 Glasses of wine per week  . Drug use: No    ROS   Objective:   Vitals: BP 129/86 (BP Location: Right Arm)   Pulse (!) 113   Temp 99 F (37.2 C) (Oral)   Resp 20   SpO2 98%   Physical Exam Constitutional:      General: She is not in acute distress.    Appearance: Normal appearance. She is well-developed. She is not ill-appearing, toxic-appearing or diaphoretic.  HENT:     Head: Normocephalic and atraumatic.     Right Ear: External ear normal.     Left Ear: External ear normal.     Nose: Nose normal.     Mouth/Throat:     Mouth: Mucous membranes are moist.     Pharynx: Oropharynx is clear.     Comments: No oral or facial swelling.  Airway is patent. Eyes:     General: No scleral icterus.       Right eye: No discharge.        Left eye: No discharge.     Extraocular Movements: Extraocular movements intact.     Conjunctiva/sclera: Conjunctivae normal.     Pupils: Pupils are equal, round, and reactive to light.  Cardiovascular:     Rate and Rhythm: Normal rate and regular rhythm.     Pulses: Normal pulses.     Heart sounds: Normal heart sounds. No murmur heard. No friction rub. No gallop.   Pulmonary:     Effort: Pulmonary effort is normal. No respiratory distress.     Breath sounds: Normal breath sounds. No stridor. No wheezing, rhonchi or rales.  Musculoskeletal:       Legs:  Skin:    General: Skin is warm and dry.     Findings: No rash.  Neurological:     Mental Status: She is alert and oriented to person, place, and time.  Psychiatric:        Mood and Affect: Mood normal.        Behavior: Behavior normal.        Thought Content: Thought content normal.        Judgment: Judgment normal.      Assessment and Plan :   PDMP not reviewed this encounter.  1.  Infected wound   2. Itching   3. History of hip surgery     Do not suspect an allergic reaction from the surgery.  Recommended starting doxycycline for an infected wound.  Emphasized need for follow-up with her surgeon for wound dehiscence.  Recommended using hydroxyzine  for itching.  Patient had also wanted a medication for itching of her hands.  Has been using hydrocortisone with very temporary relief.  Will use triamcinolone.  Follow-up with PCP as soon as possible. Counseled patient on potential for adverse effects with medications prescribed/recommended today, ER and return-to-clinic precautions discussed, patient verbalized understanding.    Jaynee Eagles, PA-C 04/03/20 1010

## 2020-04-02 NOTE — Discharge Instructions (Addendum)
Please make sure you contact your surgeon as soon as possible for a consultation on your wound dehiscence. Start doxycycline to address wound infection. You can use hydroxyzine for itching. Use triamcinolone over areas not around your wound.

## 2020-04-02 NOTE — ED Triage Notes (Addendum)
Pt reports itching to bil arms ,chest ,back and bil  Legs. Pt also reports the surgical site looks like it is opening up.  Pt reports hip surgery 11/2 weeks ago

## 2020-05-19 ENCOUNTER — Telehealth: Payer: Self-pay | Admitting: Gastroenterology

## 2020-05-19 NOTE — Telephone Encounter (Signed)
Left message for pt that there is already an order in for the fecal calprotectin and that she can come M-F between the hours of 7:30am-5pm, no appt needed.

## 2020-05-19 NOTE — Telephone Encounter (Signed)
Pt has been having issues with her UC. she would like to have stool test like last time. Pls call her.

## 2020-05-24 ENCOUNTER — Other Ambulatory Visit: Payer: 59

## 2020-05-24 ENCOUNTER — Other Ambulatory Visit: Payer: Self-pay | Admitting: *Deleted

## 2020-05-24 DIAGNOSIS — K51211 Ulcerative (chronic) proctitis with rectal bleeding: Secondary | ICD-10-CM

## 2020-05-27 ENCOUNTER — Other Ambulatory Visit: Payer: Self-pay | Admitting: Registered Nurse

## 2020-05-27 DIAGNOSIS — F411 Generalized anxiety disorder: Secondary | ICD-10-CM

## 2020-05-27 DIAGNOSIS — G43109 Migraine with aura, not intractable, without status migrainosus: Secondary | ICD-10-CM

## 2020-05-27 LAB — CALPROTECTIN, FECAL: Calprotectin, Fecal: <16 ug/g (ref 0–120)

## 2020-06-01 ENCOUNTER — Ambulatory Visit: Payer: 59 | Admitting: Gastroenterology

## 2020-06-07 ENCOUNTER — Other Ambulatory Visit: Payer: Self-pay | Admitting: Registered Nurse

## 2020-06-16 ENCOUNTER — Encounter: Payer: Self-pay | Admitting: Gastroenterology

## 2020-06-16 ENCOUNTER — Ambulatory Visit (INDEPENDENT_AMBULATORY_CARE_PROVIDER_SITE_OTHER): Payer: 59 | Admitting: Gastroenterology

## 2020-06-16 ENCOUNTER — Other Ambulatory Visit: Payer: 59

## 2020-06-16 VITALS — BP 118/80 | HR 84 | Ht 63.0 in | Wt 145.1 lb

## 2020-06-16 DIAGNOSIS — R140 Abdominal distension (gaseous): Secondary | ICD-10-CM | POA: Diagnosis not present

## 2020-06-16 DIAGNOSIS — K51211 Ulcerative (chronic) proctitis with rectal bleeding: Secondary | ICD-10-CM

## 2020-06-16 NOTE — Patient Instructions (Signed)
If you are age 36 or older, your body mass index should be between 23-30. Your Body mass index is 25.71 kg/m. If this is out of the aforementioned range listed, please consider follow up with your Primary Care Provider.  If you are age 59 or younger, your body mass index should be between 19-25. Your Body mass index is 25.71 kg/m. If this is out of the aformentioned range listed, please consider follow up with your Primary Care Provider.   You have been scheduled for a colonoscopy. Please follow written instructions given to you at your visit today.  Please pick up your prep supplies at the pharmacy within the next 1-3 days. If you use inhalers (even only as needed), please bring them with you on the day of your procedure.  Your provider has requested that you go to the basement level for lab work before leaving today. Press "B" on the elevator. The lab is located at the first door on the left as you exit the elevator.  Due to recent changes in healthcare laws, you may see the results of your imaging and laboratory studies on MyChart before your provider has had a chance to review them.  We understand that in some cases there may be results that are confusing or concerning to you. Not all laboratory results come back in the same time frame and the provider may be waiting for multiple results in order to interpret others.  Please give [place] 48 hours in order for your provider to thoroughly review all the results before contacting the office for clarification of your results.   Thank you for trusting me with your gastrointestinal care!    Alonza Bogus, PA-C

## 2020-06-16 NOTE — Progress Notes (Signed)
Reviewed and agree with management plans. Colonoscopy scheduled for July due to schedule limits. Will put the patient on a cancellation list in the event that an earlier opportunity for endoscopic evaluation becomes available.   Litsy Epting L. Tarri Glenn, MD, MPH

## 2020-06-16 NOTE — Progress Notes (Signed)
06/16/2020 [name] [id number] 1985-10-09   HISTORY OF PRESENT ILLNESS: This is a 35 year old female is a patient of Dr. Tarri Glenn.  She has history of ulcerative proctitis that was diagnosed on colonoscopy in August 2020.  Initially she was using Rowasa enemas, but they did not seem to be working well for her and fecal calprotectin levels were high.  She was placed on Lialda 4.8 g daily.  She reports that she was doing well and then in January she had a hip surgery performed at [facility].  While she was there they were only giving her 2.4 g daily.  She says that since that time her gut has just not felt right.  She denies any diarrhea.  She sees only minimal occasional red blood in her stool.  She mostly complains of a lot of fullness/bloating/pressure/distention in her lower abdomen.  She says that it just feels awful and its like that all the time.  She says that she only eat a couple bites of food and her gut just feels so full.  She has been using Gas-X, but she is concerned about using that every day.  She says that she just wants to be tested for everything and she does not want to leave any stone unturned.  She wants to know what else is going on that could be causing her symptoms.  Her recent fecal calprotectin levels were normal.   Past Medical History:  Diagnosis Date  . ACL graft tear (HCC)    left knee  . Allergy   . Anxiety   . Complication of anesthesia    woke up during surgery  . Depression   . Family history of adverse reaction to anesthesia     mom is difficult to put to sleep  . IUD (intrauterine device) in place   . Knee pain, right   . Migraine headache   . Sleep paralysis   . Ulcerative proctitis [facility])    Past Surgical History:  Procedure Laterality Date  . ANTERIOR CRUCIATE LIGAMENT REPAIR Left 08/10/2014   Procedure: ANTERIOR CRUCIATE LIGAMENT (ACL) REPAIR;  Surgeon: Sydnee Cabal, MD;  Location: [facility];  Service: Orthopedics;  Laterality:  Left;  . HIP SURGERY Right 08/25/2019   PAO surgery [facility] Mad[facility]  . KNEE ARTHROSCOPY Left 08/10/2014   Procedure: ARTHROSCOPY KNEE DEBRIDEMENT ALLOGRAFT ACL REVISION RECONSTRUCTION;  Surgeon: Sydnee Cabal, MD;  Location: [facility];  Service: Orthopedics;  Laterality: Left;  . KNEE ARTHROSCOPY W/ ACL RECONSTRUCTION Left 2006  . SPINAL FUSION    . widom tooth extraction     only had 1 wisdom tooth     reports that she has never smoked. She has never used smokeless tobacco. She reports current alcohol use. She reports current drug use. Drug: Marijuana. family history includes Alzheimer's disease in her maternal grandmother; Arthritis in her maternal grandmother and mother; Breast cancer in her paternal grandmother; Goiter in her father; Hyperlipidemia in her father; Hypertension in her maternal grandfather and maternal grandmother; Migraines in an other family member; Stroke in her maternal grandfather and paternal grandmother. Allergies  Allergen Reactions  . Other Rash    Adhesive glue in surgical adhesive  . Molds & Smuts Swelling      Outpatient Encounter Medications as of 06/16/2020  Medication Sig  . amphetamine-dextroamphetamine (ADDERALL) 10 MG tablet Take 1 tablet (10 mg total) by mouth daily with breakfast.  . clonazePAM (KLONOPIN) 0.5 MG tablet TAKE 1 TABLET(0.5  MG) BY MOUTH DAILY AS NEEDED FOR ANXIETY  . cyclobenzaprine (FLEXERIL) 10 MG tablet TAKE 1 TABLET(10 MG) BY MOUTH AT BEDTIME  . DULoxetine (CYMBALTA) 30 MG capsule TAKE 1 CAPSULE BY MOUTH EVERY DAY  . gabapentin (NEURONTIN) 300 MG capsule Take 600 mg by mouth 2 (two) times daily.   [name] HYDROcodone-acetaminophen (NORCO/VICODIN) 5-325 MG tablet Take 1 tablet by mouth 3 (three) times daily as needed.  . mesalamine (LIALDA) 1.2 g EC tablet Take 4 tablets (4.8 g total) by mouth daily with breakfast.  . SUMAtriptan (IMITREX) 100 MG tablet TAKE 1 TABLET BY MOUTH IMMEDIATELY. MAY REPEAT 2 HOURS IF NEEDED FOR  MIGRAINE.  [name] tiZANidine (ZANAFLEX) 4 MG tablet Take 4 mg by mouth 3 (three) times daily as needed.  . [DISCONTINUED] buPROPion (WELLBUTRIN XL) 150 MG 24 hr tablet TAKE 1 TABLET(150 MG) BY MOUTH DAILY (Patient not taking: Reported on 06/16/2020)  . [DISCONTINUED] doxycycline (VIBRAMYCIN) 100 MG capsule Take 1 capsule (100 mg total) by mouth 2 (two) times daily. (Patient not taking: Reported on 06/16/2020)  . [DISCONTINUED] hydrOXYzine (ATARAX/VISTARIL) 50 MG tablet Take 1 tablet (50 mg total) by mouth 3 (three) times daily as needed. (Patient not taking: Reported on 06/16/2020)  . [DISCONTINUED] triamcinolone (KENALOG) 0.1 % Apply 1 application topically 2 (two) times daily. (Patient not taking: Reported on 06/16/2020)   No facility-administered encounter medications on file as of 06/16/2020.     REVIEW OF SYSTEMS  : All other systems reviewed and negative except where noted in the History of Present Illness.   PHYSICAL EXAM: BP 118/80 (BP Location: Left Arm, Patient Position: Sitting, Cuff Size: Normal)   Pulse 84   Ht 5' 3"  (1.6 m)   Wt 145 lb 2 oz (65.8 kg)   BMI 25.71 kg/m  General: Well developed white female in no acute distress Head: Normocephalic and atraumatic Eyes:  Sclerae anicteric, conjunctiva pink. Ears: Normal auditory acuity Lungs: Clear throughout to auscultation; no W/R/R. Heart: Regular rate and rhythm; no M/R/G. Abdomen: Soft, non-distended.  BS present.  Minimal lower abdominal TTP. Rectal:  Will be done at the time of colonoscopy. Musculoskeletal: Symmetrical with no gross deformities  Skin: No lesions on visible extremities Extremities: No edema  Neurological: Alert oriented x 4, grossly non-focal Psychological:  Alert and cooperative. Normal mood and affect  ASSESSMENT AND PLAN: *35 year old female with history of ulcerative proctitis.  Has been on Lialda 4.8 g daily.  Fecal calprotectin levels now normal.  She reports daily lower abdominal  bloating/fullness/distention since January.  This is very distressing to her.  She has been using Gas-X, but wants to know what is wrong and what else she could be causing her symptoms.  We will check celiac labs.  We will plan for repeat colonoscopy with Dr. Tarri Glenn as there was plan to do this when her proctitis was better controlled.  We discussed lactose-free diet as there is often lactose intolerance overlap with ulcerative colitis.  She was given literature on this.  We also discussed following a low fiber/low residue diet for now as well.  She could try IBgard instead of Gas-X to see if that works better for her symptoms.  Could consider trial of probiotic and possibly a course of Xifaxan for overlapping SIBO pending the results of the above.  **The risks, benefits, and alternatives to colonoscopy were discussed with the patient and she consents to proceed.   CC:  Maximiano Coss, NP

## 2020-06-17 NOTE — Progress Notes (Signed)
Appt added to cancellation list.

## 2020-06-20 LAB — IGA: Immunoglobulin A: 140 mg/dL (ref 47–310)

## 2020-06-20 LAB — TISSUE TRANSGLUTAMINASE, IGA: (tTG) Ab, IgA: <1 U/mL

## 2020-06-23 ENCOUNTER — Other Ambulatory Visit: Payer: Self-pay

## 2020-06-23 DIAGNOSIS — K51211 Ulcerative (chronic) proctitis with rectal bleeding: Secondary | ICD-10-CM

## 2020-08-09 ENCOUNTER — Other Ambulatory Visit: Payer: Self-pay | Admitting: Registered Nurse

## 2020-08-09 DIAGNOSIS — F411 Generalized anxiety disorder: Secondary | ICD-10-CM

## 2020-08-09 NOTE — Telephone Encounter (Signed)
LFD 06/01/20 #30 with no refills LOV 01/20/20 NOV none

## 2020-08-22 ENCOUNTER — Encounter: Payer: Self-pay | Admitting: Gastroenterology

## 2020-08-31 ENCOUNTER — Telehealth: Payer: Self-pay | Admitting: Gastroenterology

## 2020-08-31 NOTE — Telephone Encounter (Signed)
Noted  

## 2020-08-31 NOTE — Telephone Encounter (Signed)
Good afternoon Dr. Tarri Glenn, due to a death in the family, patient called to reschedule their procedure from 09/01/20 to 10/12/20.

## 2020-09-01 ENCOUNTER — Encounter: Payer: 59 | Admitting: Gastroenterology

## 2020-09-21 ENCOUNTER — Other Ambulatory Visit: Payer: Self-pay

## 2020-09-21 ENCOUNTER — Ambulatory Visit (AMBULATORY_SURGERY_CENTER): Payer: 59 | Admitting: *Deleted

## 2020-09-21 VITALS — Ht 63.0 in | Wt 139.0 lb

## 2020-09-21 DIAGNOSIS — K51211 Ulcerative (chronic) proctitis with rectal bleeding: Secondary | ICD-10-CM

## 2020-09-21 NOTE — Progress Notes (Signed)
Patient's pre-visit was done today over the phone with the patient due to COVID-19 pandemic. Name,DOB and address verified. Insurance verified. Patient denies any allergies to Eggs and Soy. Patient denies any problems with anesthesia/sedation. Patient denies taking diet pills or blood thinners. No home Oxygen. Packet of Prep instructions mailed to patient including a copy of a consent form-pt is aware. Patient understands to call us back with any questions or concerns. Patient is aware of our care-partner policy and JSEGB-15 safety protocol.   EMMI education assigned to the patient for the procedure, sent to [place].   The patient is COVID-19 vaccinated, per patient. Patient denies any changes in medical chart hx since last GI OV.

## 2020-10-11 ENCOUNTER — Encounter: Payer: Self-pay | Admitting: Gastroenterology

## 2020-10-11 ENCOUNTER — Ambulatory Visit (AMBULATORY_SURGERY_CENTER): Payer: 59 | Admitting: Gastroenterology

## 2020-10-11 VITALS — BP 115/77 | HR 69 | Temp 97.1°F | Resp 15 | Ht 63.0 in | Wt 139.0 lb

## 2020-10-11 DIAGNOSIS — K6289 Other specified diseases of anus and rectum: Secondary | ICD-10-CM

## 2020-10-11 DIAGNOSIS — K51211 Ulcerative (chronic) proctitis with rectal bleeding: Secondary | ICD-10-CM

## 2020-10-11 MED ORDER — SODIUM CHLORIDE 0.9 % IV SOLN: 500.0000 mL | Freq: Once | INTRAVENOUS | Status: DC

## 2020-10-11 NOTE — Progress Notes (Signed)
Referring Provider: Maximiano Coss, NP Primary Care Physician:  Harrie Foreman, MD  Chief complaint  Persistent lower GI symptoms   IMPRESSION:  Lower abdominal pain and bloating Distal ulcerative colitis presenting with rectal bleeding    - diagnosed on colonoscopy 10/22/18    - fecal calprotectin normalized on Lialdia Right lower quadrant with concerns for appendicitis, now resolved    - no source identified on CTAP 04/24/19 Psoas/sciatica pain No known family history of colon cancer or polyps    PLAN: Colonoscopy  Please see the "Patient Instructions" section for addition details about the plan.  HPI: [name] is a 35 y.o. female presents for colonoscopy. Has ongoing lower abdominal pain, bloating, and fullness since 02/2020 despite normallization of fecal calprotectin on Lialda 4.8 g daily.   Please see the office note from 06/16/20 for full details.    Past Medical History:  Diagnosis Date   ACL graft tear (HCC)    left knee   Allergy    Anxiety    Complication of anesthesia    woke up during surgery   Depression    Family history of adverse reaction to anesthesia     mom is difficult to put to sleep   IUD (intrauterine device) in place    Knee pain, right    Migraine headache    Sleep paralysis    Ulcerative proctitis [facility]     Past Surgical History:  Procedure Laterality Date   ANTERIOR CRUCIATE LIGAMENT REPAIR Left 08/10/2014   Procedure: ANTERIOR CRUCIATE LIGAMENT (ACL) REPAIR;  Surgeon: Sydnee Cabal, MD;  Location: [facility];  Service: Orthopedics;  Laterality: Left;   HIP SURGERY Right 08/25/2019   PAO surgery [facility]   KNEE ARTHROSCOPY Left 08/10/2014   Procedure: ARTHROSCOPY KNEE DEBRIDEMENT ALLOGRAFT ACL REVISION RECONSTRUCTION;  Surgeon: Sydnee Cabal, MD;  Location: [facility];  Service: Orthopedics;  Laterality: Left;   KNEE ARTHROSCOPY W/ ACL RECONSTRUCTION Left 2006   SPINAL FUSION      widom tooth extraction     only had 1 wisdom tooth     Current Outpatient Medications  Medication Sig Dispense Refill   clonazePAM (KLONOPIN) 0.5 MG tablet TAKE 1 TABLET(0.5 MG) BY MOUTH DAILY AS NEEDED FOR ANXIETY 30 tablet 0   cyclobenzaprine (FLEXERIL) 10 MG tablet TAKE 1 TABLET(10 MG) BY MOUTH AT BEDTIME 30 tablet 3   DULoxetine (CYMBALTA) 30 MG capsule TAKE 1 CAPSULE BY MOUTH EVERY DAY 90 capsule 0   gabapentin (NEURONTIN) 300 MG capsule Take 600 mg by mouth 2 (two) times daily.      mesalamine (LIALDA) 1.2 g EC tablet Take 4 tablets (4.8 g total) by mouth daily with breakfast. 360 tablet 3   SUMAtriptan (IMITREX) 100 MG tablet TAKE 1 TABLET BY MOUTH IMMEDIATELY. MAY REPEAT 2 HOURS IF NEEDED FOR MIGRAINE. 25 tablet 5   amphetamine-dextroamphetamine (ADDERALL) 10 MG tablet Take 1 tablet (10 mg total) by mouth daily with breakfast. 30 tablet 0   HYDROcodone-acetaminophen (NORCO/VICODIN) 5-325 MG tablet Take 1 tablet by mouth 3 (three) times daily as needed.     tiZANidine (ZANAFLEX) 4 MG tablet Take 4 mg by mouth 3 (three) times daily as needed.     Current Facility-Administered Medications  Medication Dose Route Frequency Provider Last Rate Last Admin   0.9 %  sodium chloride infusion  500 mL Intravenous Once Thornton Park, MD        Allergies as of 10/11/2020 - Review Complete  10/11/2020  Allergen Reaction Noted   Other Rash 04/25/2020   Tape Rash 04/25/2020   Wound dressing adhesive Rash 04/25/2020   Molds & smuts Swelling 05/13/2017    Family History  Problem Relation Age of Onset   Goiter Father    Hyperlipidemia Father    Arthritis Mother    Hypertension Maternal Grandmother    Alzheimer's disease Maternal Grandmother    Arthritis Maternal Grandmother    Stroke Maternal Grandfather    Hypertension Maternal Grandfather    Stroke Paternal Grandmother    Breast cancer Paternal Grandmother    Migraines Other        on her father's side   Colon cancer Neg Hx     Esophageal cancer Neg Hx    Rectal cancer Neg Hx    Stomach cancer Neg Hx       Physical Exam: General:   Alert,  well-nourished, pleasant and cooperative in NAD Head:  Normocephalic and atraumatic. Eyes:  Sclera clear, no icterus.   Conjunctiva pink. Ears:  Normal auditory acuity. Nose:  No deformity, discharge,  or lesions. Mouth:  No deformity or lesions.   Neck:  Supple; no masses or thyromegaly. Lungs:  Clear throughout to auscultation.   No wheezes. Heart:  Regular rate and rhythm; no murmurs. Abdomen:  Soft,nontender, nondistended, normal bowel sounds, no rebound or guarding. No hepatosplenomegaly.   Rectal:  Deferred  Msk:  Symmetrical. No boney deformities LAD: No inguinal or umbilical LAD Extremities:  No clubbing or edema. Neurologic:  Alert and  oriented x4;  grossly nonfocal Skin:  Intact without significant lesions or rashes. Psych:  Alert and cooperative. Normal mood and affect.    Teague Goynes L. Tarri Glenn, MD, MPH 10/11/2020, 3:17 PM

## 2020-10-11 NOTE — Progress Notes (Signed)
Called to room to assist during endoscopic procedure.  Patient ID and intended procedure confirmed with present staff. Received instructions for my participation in the procedure from the performing physician.  

## 2020-10-11 NOTE — Patient Instructions (Signed)
Resume previous diet, continue present medications. Awaiting pathology results. Empiric trial of Xifaxan 550 mg x 14 days if biopsies are negative.  YOU HAD AN ENDOSCOPIC PROCEDURE TODAY AT [facility]:   Refer to the procedure report that was given to you for any specific questions about what was found during the examination.  If the procedure report does not answer your questions, please call your gastroenterologist to clarify.  If you requested that your care partner not be given the details of your procedure findings, then the procedure report has been included in a sealed envelope for you to review at your convenience later.  YOU SHOULD EXPECT: Some feelings of bloating in the abdomen. Passage of more gas than usual.  Walking can help get rid of the air that was put into your GI tract during the procedure and reduce the bloating. If you had a lower endoscopy (such as a colonoscopy or flexible sigmoidoscopy) you may notice spotting of blood in your stool or on the toilet paper. If you underwent a bowel prep for your procedure, you may not have a normal bowel movement for a few days.  Please Note:  You might notice some irritation and congestion in your nose or some drainage.  This is from the oxygen used during your procedure.  There is no need for concern and it should clear up in a day or so.  SYMPTOMS TO REPORT IMMEDIATELY:  Following lower endoscopy (colonoscopy or flexible sigmoidoscopy):  Excessive amounts of blood in the stool  Significant tenderness or worsening of abdominal pains  Swelling of the abdomen that is new, acute  Fever of 100F or higher   For urgent or emergent issues, a gastroenterologist can be reached at any hour by calling [phone]. Do not use MyChart messaging for urgent concerns.    DIET:  We do recommend a small meal at first, but then you may proceed to your regular diet.  Drink plenty of fluids but you should avoid alcoholic beverages for  24 hours.  ACTIVITY:  You should plan to take it easy for the rest of today and you should NOT DRIVE or use heavy machinery until tomorrow (because of the sedation medicines used during the test).    FOLLOW UP: Our staff will call the number listed on your records 48-72 hours following your procedure to check on you and address any questions or concerns that you may have regarding the information given to you following your procedure. If we do not reach you, we will leave a message.  We will attempt to reach you two times.  During this call, we will ask if you have developed any symptoms of COVID 19. If you develop any symptoms (ie: fever, flu-like symptoms, shortness of breath, cough etc.) before then, please call [phone].  If you test positive for Covid 19 in the 2 weeks post procedure, please call and report this information to [place].    If any biopsies were taken you will be contacted by phone or by letter within the next 1-3 weeks.  Please call us at [phone] if you have not heard about the biopsies in 3 weeks.    SIGNATURES/CONFIDENTIALITY: You and/or your care partner have signed paperwork which will be entered into your electronic medical record.  These signatures attest to the fact that that the information above on your After Visit Summary has been reviewed and is understood.  Full responsibility of the confidentiality of this discharge information lies with  you and/or your care-partner.

## 2020-10-11 NOTE — Progress Notes (Signed)
To PACU, VSS. Report to [email or web address] 

## 2020-10-11 NOTE — Op Note (Signed)
[facility] Patient Name: [name] Procedure Date: 10/11/2020 3:10 PM MRN: [id number] Endoscopist: Thornton Park MD, MD Age: 35 Referring MD:  Date of Birth: 01-06-1986 Gender: Female Account #: [id number] Procedure:                Colonoscopy Indications:              Lower abdominal pain and bloating                           Distal ulcerative colitis presenting with rectal                            bleeding                           - diagnosed on colonoscopy 10/22/18                           - fecal calprotectin normalized on Lialdia 4.8g                            daily                           Right lower quadrant with concerns for                            appendicitis, now resolved                           - no source identified on CTAP 04/24/19                           No known family history of colon cancer or polyps Medicines:                Monitored Anesthesia Care Procedure:                Pre-Anesthesia Assessment:                           - Prior to the procedure, a History and Physical                            was performed, and patient medications and                            allergies were reviewed. The patient's tolerance of                            previous anesthesia was also reviewed. The risks                            and benefits of the procedure and the sedation                            options and risks were discussed with the patient.  All questions were answered, and informed consent                            was obtained. Prior Anticoagulants: The patient has                            taken no previous anticoagulant or antiplatelet                            agents. ASA Grade Assessment: II - A patient with                            mild systemic disease. After reviewing the risks                            and benefits, the patient was deemed in                            satisfactory  condition to undergo the procedure.                           After obtaining informed consent, the colonoscope                            was passed under direct vision. Throughout the                            procedure, the patient's blood pressure, pulse, and                            oxygen saturations were monitored continuously. The                            CF [id number] #[id number] was introduced through the anus                            and advanced to the 5 cm into the ileum. A second                            forward view of the right colon was performed. The                            colonoscopy was performed without difficulty. The                            patient tolerated the procedure well. The quality                            of the bowel preparation was good. The terminal                            ileum, ileocecal valve, appendiceal orifice, and  rectum were photographed. Scope In: 3:29:38 PM Scope Out: 3:39:28 PM Scope Withdrawal Time: 0 hours 6 minutes 42 seconds  Total Procedure Duration: 0 hours 9 minutes 50 seconds  Findings:                 The perianal and digital rectal examinations were                            normal.                           The entire examined colon appeared normal except                            for a sublte change in the vascular pattern in the                            distal rectum. Biopsies were taken from the right                            colon, transverse colon, left colon, and rectum                            with a cold forceps for histology. Estimated blood                            loss was minimal.                           The terminal ileum appeared normal.                           The exam was otherwise without abnormality on                            direct and retroflexion views. Complications:            No immediate complications. Estimated blood loss:                             Minimal. Estimated Blood Loss:     Estimated blood loss was minimal. Impression:               - The entire examined colon is normal. Biopsied.                           - The examined portion of the ileum was normal.                           - The examination was otherwise normal on direct                            and retroflexion views. Recommendation:           - Patient has a contact number available for  emergencies. The signs and symptoms of potential                            delayed complications were discussed with the                            patient. Return to normal activities tomorrow.                            Written discharge instructions were provided to the                            patient.                           - Resume previous diet.                           - Continue present medications.                           - Await pathology results.                           - Empiric trial of Xifaxan 550 mg TID x 14 days if                            biopsies are negative Thornton Park MD, MD 10/11/2020 3:44:54 PM This report has been signed electronically.

## 2020-10-11 NOTE — Progress Notes (Signed)
VS by CW  Pt's states no medical or surgical changes since previsit or office visit.  

## 2020-10-13 ENCOUNTER — Telehealth: Payer: Self-pay

## 2020-10-13 NOTE — Telephone Encounter (Signed)
First post procedure follow up call, no answer 

## 2020-10-13 NOTE — Telephone Encounter (Signed)
Left message on answer machine.

## 2020-10-14 ENCOUNTER — Other Ambulatory Visit: Payer: Self-pay | Admitting: Gastroenterology

## 2020-10-19 ENCOUNTER — Other Ambulatory Visit: Payer: Self-pay

## 2020-10-19 MED ORDER — DICYCLOMINE HCL 10 MG PO CAPS: 20.0000 mg | ORAL_CAPSULE | Freq: Four times a day (QID) | ORAL | 0 refills | Status: DC | PRN

## 2020-11-14 ENCOUNTER — Other Ambulatory Visit: Payer: Self-pay | Admitting: Registered Nurse

## 2020-11-14 NOTE — Telephone Encounter (Signed)
Patient is requesting a refill of the following medications: Requested Prescriptions   Pending Prescriptions Disp Refills   DULoxetine (CYMBALTA) 30 MG capsule [Pharmacy Med Name: DULOXETINE DR 30MG CAPSULES] 90 capsule 0    Sig: TAKE 1 CAPSULE BY MOUTH EVERY DAY    Date of patient request: 11/14/2020 Last office visit: 08/09/2020 Date of last refill: 06/08/2020 Last refill amount: 90 capsules Follow up time period per chart: none new pcp listed

## 2020-11-18 ENCOUNTER — Other Ambulatory Visit: Payer: Self-pay | Admitting: Registered Nurse

## 2020-11-18 DIAGNOSIS — F411 Generalized anxiety disorder: Secondary | ICD-10-CM

## 2020-11-23 ENCOUNTER — Other Ambulatory Visit (INDEPENDENT_AMBULATORY_CARE_PROVIDER_SITE_OTHER): Payer: 59

## 2020-11-23 ENCOUNTER — Encounter: Payer: Self-pay | Admitting: Gastroenterology

## 2020-11-23 ENCOUNTER — Ambulatory Visit (INDEPENDENT_AMBULATORY_CARE_PROVIDER_SITE_OTHER): Payer: 59 | Admitting: Gastroenterology

## 2020-11-23 VITALS — BP 124/70 | HR 66 | Ht 63.0 in | Wt 136.6 lb

## 2020-11-23 DIAGNOSIS — K51211 Ulcerative (chronic) proctitis with rectal bleeding: Secondary | ICD-10-CM | POA: Diagnosis not present

## 2020-11-23 DIAGNOSIS — R140 Abdominal distension (gaseous): Secondary | ICD-10-CM | POA: Diagnosis not present

## 2020-11-23 DIAGNOSIS — R1031 Right lower quadrant pain: Secondary | ICD-10-CM

## 2020-11-23 LAB — CBC WITH DIFFERENTIAL/PLATELET
Basophils Absolute: 0 10*3/uL (ref 0.0–0.1)
Basophils Relative: 0.4 % (ref 0.0–3.0)
Eosinophils Absolute: 0.1 10*3/uL (ref 0.0–0.7)
Eosinophils Relative: 1.6 % (ref 0.0–5.0)
HCT: 42.8 % (ref 36.0–46.0)
Hemoglobin: 14.4 g/dL (ref 12.0–15.0)
Lymphocytes Relative: 27.7 % (ref 12.0–46.0)
Lymphs Abs: 1.7 10*3/uL (ref 0.7–4.0)
MCHC: 33.7 g/dL (ref 30.0–36.0)
MCV: 91.3 fl (ref 78.0–100.0)
Monocytes Absolute: 0.5 10*3/uL (ref 0.1–1.0)
Monocytes Relative: 8.7 % (ref 3.0–12.0)
Neutro Abs: 3.9 10*3/uL (ref 1.4–7.7)
Neutrophils Relative %: 61.6 % (ref 43.0–77.0)
Platelets: 362 10*3/uL (ref 150.0–400.0)
RBC: 4.68 Mil/uL (ref 3.87–5.11)
RDW: 12.7 % (ref 11.5–15.5)
WBC: 6.3 10*3/uL (ref 4.0–10.5)

## 2020-11-23 LAB — IBC + FERRITIN
Ferritin: 16.2 ng/mL (ref 10.0–291.0)
Iron: 96 ug/dL (ref 42–145)
Saturation Ratios: 25 % (ref 20.0–50.0)
TIBC: 383.6 ug/dL (ref 250.0–450.0)
Transferrin: 274 mg/dL (ref 212.0–360.0)

## 2020-11-23 LAB — SEDIMENTATION RATE: Sed Rate: 12 mm/hr (ref 0–20)

## 2020-11-23 LAB — HIGH SENSITIVITY CRP: CRP, High Sensitivity: 1.57 mg/L (ref 0.000–5.000)

## 2020-11-23 MED ORDER — MESALAMINE 4 G RE ENEM: 4.0000 g | ENEMA | Freq: Two times a day (BID) | RECTAL | 2 refills | Status: DC

## 2020-11-23 NOTE — Progress Notes (Signed)
Referring Provider: Harrie Foreman, MD Primary Care Physician:  Harrie Foreman, MD  Chief complaint:  Ulcerative colitis   IMPRESSION:  Distal ulcerative colitis presenting with rectal bleeding    - diagnosed on colonoscopy 10/22/18    - fecal calprotectin 340 11/11/18, 246 08/05/19    - persistent symptoms Right lower quadrant with concerns for appendicitis, now resolved    - no source identified on CTAP 04/24/19 Psoas/sciatica pain with multiple hip surgeries No known family history of colon cancer or polyps  Ongoing symptoms. She has clearly not achieved clinical remission given the intermittent bleedinging. Fecal calprotectin has improved but remains elevated at 246. Discussed treatment options. Will start oral 5ASA therapy continue Rowasa. Avoiding NSAIDs remains important. Covid vaccine recommended. I stressed the importance for close follow-up. She may need to consider alternative treatments such as biologics.   Screening labs reviewed with the patient as noted below including labs for iron deficiency given the ongoing bleeding.    Discussed management. She misunderstood the treatment recommendations. She will try full medical management. If not responding, will need to consider alternative therapies including biologics.   PLAN: - Fecal calprotectin - CBC, ESR, CRP, iron, ferritin - CT abd/pelvis with contrast - Avoid all NSAIDs because these can trigger your colitis - Continue Lialda 4.8g daily (3 month supply with 3 refills) - Resume Rowasa enema PR BID for one week - Resume dicylomine 10 mg QID taken prior to meals - Consider trial of Xifaxan given distension and bloating - Follow-up by MyChart next week - Return in 6 weeks, or earlier as needed - Follow-up with GYN re: questions about endometriosis - Follow-up with Dr. Marcille Blanco re: questions about thyroid dysfunction  HPI: [name] is a 35 y.o. female with distal ulcerative colitis with ongoing symptoms of  abdominal cramping and bloody stools.   Returns after recent colonoscopy showing active distal colitis. I recommended that she continue Lialda and Canasa with dicyclomine.  Reports upper abdominal pain. Starts in the upper abdomen and slowly moves lower. Has a most predominant RLQ that is radiating to her right back that may be related to her hip surgery. Has ongoing "constipation" - soft, thin, long poop that happens most days. Pain does not improve with defecation.  Appetite good. Unintentional weight gain.  No extra-GI manifestations of IBD except for possible hip pain. Normally bowel frequency is 2-3 BM daily.   She is concerned about the possibility for endometrosis.  Her father also had symptomatic thyroid disease requiring thyoidectomy and has told her she needs to consider thyroid problems in her differential.   Endoscopic history: - Colonoscopy 10/22/18: Diffuse mild inflammation characterized by erythema, friability and granularity was found in the rectum extending to 16cm from the anal verge.  The remainder of the colon appeared normal. Rectal biopsies showed moderately active chronic colitis consistent with inflammatory bowel disease.  Other biopsies taken throughout the colon and terminal ileum were normal. - Colonoscopy 10/11/20: chronic proctitis, otherwise normal colon biopsies throughout the colon  Labs 10/22/18: iron 97, ferritin 45.7, WBC 10.7, hgb 13.2, platelets 338 Labs 11/11/18: fecal calprotectin 340, GI pathogen panel negative Labs 06/09/19: normal CMP including ALT 18, AST 17, CRP <1, normal CBC, ESR 6 Labs 09/22/19: iron 120, ferritin 49.7, hemoglobin 12.6, platelets 401  Health Maintenance:  - DEXA: N/A - Vaccinations:      - Annual Flu Vaccine  recommend, has not had one the last 2 years      - Pneumococcal  Vaccine: N/A      - Covid Vaccine: declines   - Micronutrient eval:       - Annual Vit D, B6, iron panel: Ordered on last visit, not obtained - Annual Pap (if  immunosuppressed): By Dr. Philis Pique 02/11/2018 - Surveillance labs for immunomodulators: N/A - Annual depression screening: None  Past Medical History:  Diagnosis Date   ACL graft tear ([place])    left knee   Allergy    Anxiety    Complication of anesthesia    woke up during surgery   Depression    Family history of adverse reaction to anesthesia     mom is difficult to put to sleep   IUD (intrauterine device) in place    Knee pain, right    Migraine headache    Sleep paralysis    Ulcerative proctitis [facility])     Past Surgical History:  Procedure Laterality Date   ANTERIOR CRUCIATE LIGAMENT REPAIR Left 08/10/2014   Procedure: ANTERIOR CRUCIATE LIGAMENT (ACL) REPAIR;  Surgeon: Sydnee Cabal, MD;  Location: [place];  Service: Orthopedics;  Laterality: Left;   HIP SURGERY Right 08/25/2019   PAO surgery [facility]   KNEE ARTHROSCOPY Left 08/10/2014   Procedure: ARTHROSCOPY KNEE DEBRIDEMENT ALLOGRAFT ACL REVISION RECONSTRUCTION;  Surgeon: Sydnee Cabal, MD;  Location: [facility];  Service: Orthopedics;  Laterality: Left;   KNEE ARTHROSCOPY W/ ACL RECONSTRUCTION Left 2006   SPINAL FUSION     widom tooth extraction     only had 1 wisdom tooth     Current Outpatient Medications  Medication Sig Dispense Refill   amphetamine-dextroamphetamine (ADDERALL) 10 MG tablet Take 1 tablet (10 mg total) by mouth daily with breakfast. 30 tablet 0   clonazePAM (KLONOPIN) 0.5 MG tablet TAKE 1 TABLET(0.5 MG) BY MOUTH DAILY AS NEEDED FOR ANXIETY 30 tablet 0   cyclobenzaprine (FLEXERIL) 10 MG tablet TAKE 1 TABLET(10 MG) BY MOUTH AT BEDTIME 30 tablet 3   DULoxetine (CYMBALTA) 30 MG capsule TAKE 1 CAPSULE BY MOUTH EVERY DAY 90 capsule 0   HYDROcodone-acetaminophen (NORCO/VICODIN) 5-325 MG tablet Take 1 tablet by mouth 3 (three) times daily as needed.     mesalamine (LIALDA) 1.2 g EC tablet TAKE 4 TABLETS(4.8 GRAMS) BY MOUTH DAILY WITH BREAKFAST 360 tablet 3    SUMAtriptan (IMITREX) 100 MG tablet TAKE 1 TABLET BY MOUTH IMMEDIATELY. MAY REPEAT 2 HOURS IF NEEDED FOR MIGRAINE. 25 tablet 5   dicyclomine (BENTYL) 10 MG capsule Take 2 capsules (20 mg total) by mouth 4 (four) times daily as needed for spasms. (Patient not taking: Reported on 11/23/2020) 90 capsule 0   Current Facility-Administered Medications  Medication Dose Route Frequency Provider Last Rate Last Admin   0.9 %  sodium chloride infusion  500 mL Intravenous Once Thornton Park, MD        Allergies as of 11/23/2020 - Review Complete 11/23/2020  Allergen Reaction Noted   Other Rash 04/25/2020   Tape Rash 04/25/2020   Wound dressing adhesive Rash 04/25/2020   Molds & smuts Swelling 05/13/2017    Family History  Problem Relation Age of Onset   Goiter Father    Hyperlipidemia Father    Arthritis Mother    Hypertension Maternal Grandmother    Alzheimer's disease Maternal Grandmother    Arthritis Maternal Grandmother    Stroke Maternal Grandfather    Hypertension Maternal Grandfather    Stroke Paternal Grandmother    Breast cancer Paternal Grandmother    Migraines  Other        on her father's side   Colon cancer Neg Hx    Esophageal cancer Neg Hx    Rectal cancer Neg Hx    Stomach cancer Neg Hx       PHYSICAL EXAM: Gen: Awake, alert, and oriented, and well communicative. HEENT: EOMI, non-icteric sclera, NCAT, MMM  Neck: Normal movement of head and neck  Abd: Soft, tender in the RLQ without rebound or guarding, also points it epigastrium as an area of discomfort but I am unable to reproduce that pain, NT, non-distended, no rebound or guarding, no tympany, normal bowel sounds Pulm: No labored breathing, speaking in full sentences without conversational dyspnea  Derm: No apparent lesions or bruising in visible field  MS: Left leg in a brace. Psych: Pleasant, cooperative, normal speech, thought processing seemingly intact     Sven Pinheiro L. Tarri Glenn, MD, MPH [facility] 11/23/2020, 9:03 AM

## 2020-11-23 NOTE — Patient Instructions (Signed)
If you are age 36 or older, your body mass index should be between 23-30. Your Body mass index is 24.2 kg/m. If this is out of the aforementioned range listed, please consider follow up with your Primary Care Provider.  If you are age 25 or younger, your body mass index should be between 19-25. Your Body mass index is 24.2 kg/m. If this is out of the aformentioned range listed, please consider follow up with your Primary Care Provider.   __________________________________________________________  The [facility] providers would like to encourage you to use [facility] to communicate with providers for non-urgent requests or questions.  Due to long hold times on the telephone, sending your provider a message by [facility] may be a faster and more efficient way to get a response.  Please allow 48 business hours for a response.  Please remember that this is for non-urgent requests.   Your provider has requested that you go to the basement level for lab work before leaving today. Press "B" on the elevator. The lab is located at the first door on the left as you exit the elevator.  Due to recent changes in healthcare laws, you may see the results of your imaging and laboratory studies on MyChart before your provider has had a chance to review them.  We understand that in some cases there may be results that are confusing or concerning to you. Not all laboratory results come back in the same time frame and the provider may be waiting for multiple results in order to interpret others.  Please give [place] 48 hours in order for your provider to thoroughly review all the results before contacting the office for clarification of your results.   You have been scheduled for a CT scan of the abdomen and pelvis at [place] ([address]---this is in the same building as [employer]).   You are scheduled on 11-25-2020 at 3pm. You should arrive 15 minutes prior to your appointment time for  registration. Please follow the written instructions below on the day of your exam:  WARNING: IF YOU ARE ALLERGIC TO IODINE/X-RAY DYE, PLEASE NOTIFY RADIOLOGY IMMEDIATELY AT [phone]! YOU WILL BE GIVEN A 13 HOUR PREMEDICATION PREP.  1) Do not eat or drink anything after 11am (4 hours prior to your test) 2) You have been given 2 bottles of oral contrast to drink. The solution may taste better if refrigerated, but do NOT add ice or any other liquid to this solution. Shake well before drinking.    Drink 1 bottle of contrast @ 1pm (2 hours prior to your exam)  Drink 1 bottle of contrast @ 2pm (1 hour prior to your exam)  You may take any medications as prescribed with a small amount of water, if necessary. If you take any of the following medications: METFORMIN, GLUCOPHAGE, GLUCOVANCE, AVANDAMET, RIOMET, FORTAMET, [place] MET, JANUMET, GLUMETZA or METAGLIP, you MAY be asked to HOLD this medication 48 hours AFTER the exam.  The purpose of you drinking the oral contrast is to aid in the visualization of your intestinal tract. The contrast solution may cause some diarrhea. Depending on your individual set of symptoms, you may also receive an intravenous injection of x-ray contrast/dye. Plan on being at [facility] for 30 minutes or longer, depending on the type of exam you are having performed.  This test typically takes 30-45 minutes to complete.  If you have any questions regarding your exam or if you need to reschedule,  you may call the CT department at [phone] between the hours of 8:00 am and 5:00 pm, Monday-Friday.   -- Avoid all NSAIDs because these can trigger your colitis - Continue Lialda 4.8g daily (3 month supply with 3 refills) - Resume Rowasa enema PR BID for one week - Resume dicylomine 10 mg QID taken prior to meals  It was a pleasure to see you today!  Thank you for trusting me with your gastrointestinal care!

## 2020-11-25 ENCOUNTER — Inpatient Hospital Stay: Admission: RE | Admit: 2020-11-25 | Payer: 59 | Source: Ambulatory Visit

## 2020-12-07 ENCOUNTER — Other Ambulatory Visit: Payer: Self-pay

## 2020-12-07 ENCOUNTER — Ambulatory Visit (INDEPENDENT_AMBULATORY_CARE_PROVIDER_SITE_OTHER)
Admission: RE | Admit: 2020-12-07 | Discharge: 2020-12-07 | Disposition: A | Discharge status: Home / Self Care | Payer: 59 | Source: Ambulatory Visit | Attending: Gastroenterology | Admitting: Gastroenterology

## 2020-12-07 DIAGNOSIS — R1031 Right lower quadrant pain: Secondary | ICD-10-CM

## 2020-12-07 DIAGNOSIS — R140 Abdominal distension (gaseous): Secondary | ICD-10-CM

## 2020-12-07 DIAGNOSIS — K51211 Ulcerative (chronic) proctitis with rectal bleeding: Secondary | ICD-10-CM

## 2020-12-07 MED ORDER — IOHEXOL 350 MG/ML SOLN
100.0000 mL | Freq: Once | INTRAVENOUS | Status: AC | PRN
  Administered 2020-12-07: 100 mL via INTRAVENOUS

## 2020-12-08 ENCOUNTER — Other Ambulatory Visit: Payer: Self-pay

## 2020-12-08 DIAGNOSIS — N329 Bladder disorder, unspecified: Secondary | ICD-10-CM

## 2020-12-08 DIAGNOSIS — R935 Abnormal findings on diagnostic imaging of other abdominal regions, including retroperitoneum: Secondary | ICD-10-CM

## 2020-12-30 IMAGING — XA FLUORO GUIDED NEEDLE PLACEMENT AND/OR ASPIRATION
1 series · 1 of 1 positions shown · non-contrast
Comparison: none

CLINICAL DATA: Left hip pain

[Series 1: ortho adipose · 1 of 1 slices shown]
[im 1/1]
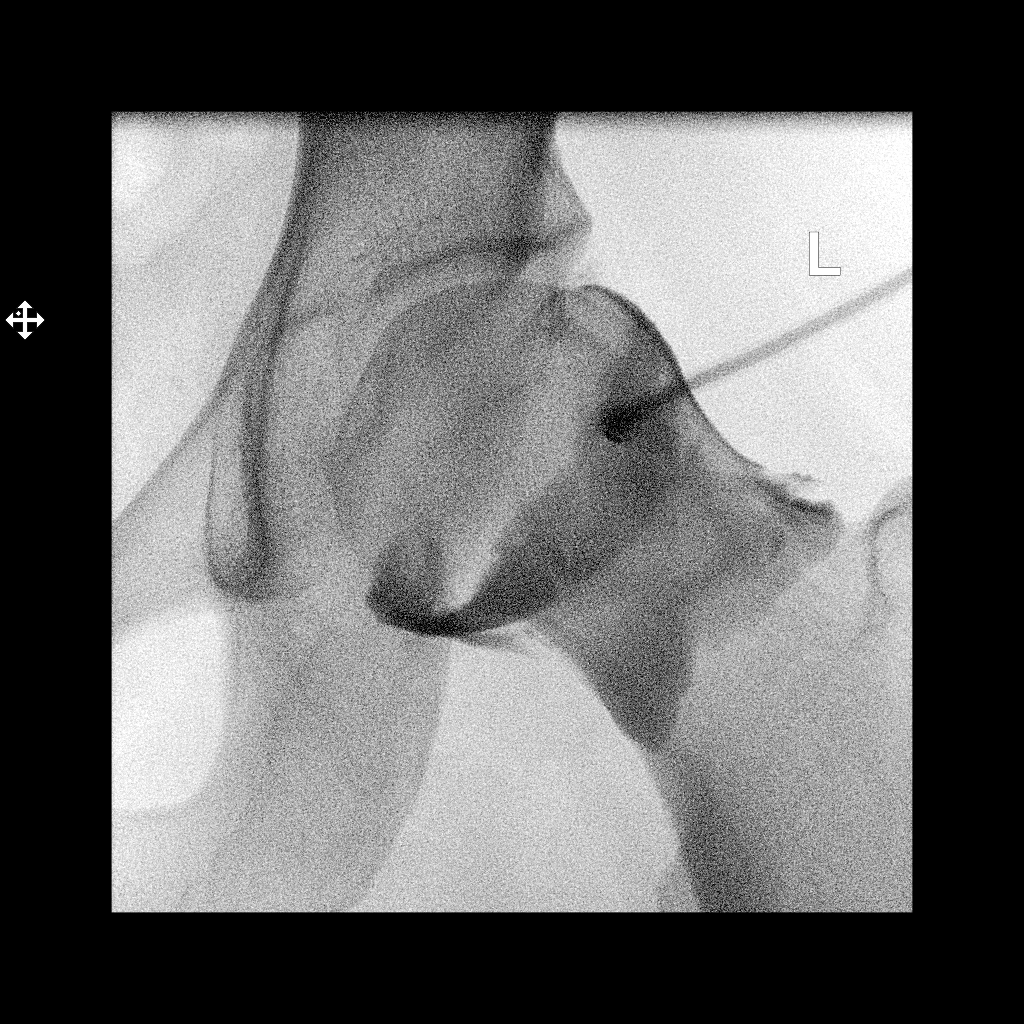

[1 of 1 positions shown; findings below may reference images not displayed]

EXAM:
Left HIP INJECTION FOR MRI

FLUOROSCOPY TIME:  Fluoroscopy Time:  13 seconds

Radiation Exposure Index (if provided by the fluoroscopic device):
0.80 mGy

Number of Acquired Spot Images: 1

PROCEDURE:
Overlying skin prepped with Betadine, draped in the usual sterile
fashion, and infiltrated locally with Lidocaine. A 22 gauge spinal
needle advanced to the superolateral margin of the left femoral
head. 1 ml of Lidocaine injected easily. Then, 12 ml of gadolinium
mixture (0.1 ml of Multihance mixed with 5 ml of Isovue-M 200
contrast and 15 ml of sterile saline) was injected into the left hip
joint. No immediate complication. The patient was subsequently
transferred to MRI for imaging.
IMPRESSION: Technically successful left hip injection under fluoroscopy for MR
arthrogram.

## 2021-01-04 ENCOUNTER — Other Ambulatory Visit: Payer: Self-pay | Admitting: Gastroenterology

## 2021-01-05 ENCOUNTER — Other Ambulatory Visit: Payer: 59

## 2021-01-05 DIAGNOSIS — R1031 Right lower quadrant pain: Secondary | ICD-10-CM

## 2021-01-05 DIAGNOSIS — K51211 Ulcerative (chronic) proctitis with rectal bleeding: Secondary | ICD-10-CM

## 2021-01-05 DIAGNOSIS — R140 Abdominal distension (gaseous): Secondary | ICD-10-CM

## 2021-01-05 HISTORY — PX: HIP SURGERY: SHX245

## 2021-01-09 LAB — CALPROTECTIN, FECAL: Calprotectin, Fecal: 18 ug/g (ref 0–120)

## 2021-02-03 ENCOUNTER — Ambulatory Visit (INDEPENDENT_AMBULATORY_CARE_PROVIDER_SITE_OTHER): Payer: 59 | Admitting: Gastroenterology

## 2021-02-03 ENCOUNTER — Encounter: Payer: Self-pay | Admitting: Gastroenterology

## 2021-02-03 VITALS — BP 118/80 | HR 124 | Ht 63.25 in | Wt 138.1 lb

## 2021-02-03 DIAGNOSIS — K51211 Ulcerative (chronic) proctitis with rectal bleeding: Secondary | ICD-10-CM | POA: Diagnosis not present

## 2021-02-03 DIAGNOSIS — R935 Abnormal findings on diagnostic imaging of other abdominal regions, including retroperitoneum: Secondary | ICD-10-CM | POA: Diagnosis not present

## 2021-02-03 DIAGNOSIS — R140 Abdominal distension (gaseous): Secondary | ICD-10-CM

## 2021-02-03 MED ORDER — DICYCLOMINE HCL 10 MG PO CAPS: 20.0000 mg | ORAL_CAPSULE | Freq: Four times a day (QID) | ORAL | 3 refills | Status: DC

## 2021-02-03 MED ORDER — MESALAMINE 1.2 G PO TBEC: DELAYED_RELEASE_TABLET | ORAL | 3 refills | Status: DC

## 2021-02-03 MED ORDER — MESALAMINE 4 G RE ENEM: 4.0000 g | ENEMA | Freq: Two times a day (BID) | RECTAL | 3 refills | Status: AC

## 2021-02-03 NOTE — Progress Notes (Signed)
Referring Provider: Harrie Foreman, MD Primary Care Physician:  Harrie Foreman, MD  Chief complaint:  Ulcerative colitis   IMPRESSION:  Distal ulcerative colitis presenting with rectal bleeding    - diagnosed on colonoscopy 10/22/18    - fecal calprotectin 340 11/11/18, 246 08/05/19    - symptoms have improved, fecal calprotectin has normalized Hematuria Suspected IBS overlap Right lower quadrant with concerns for appendicitis, now resolved    - no source identified on CTAP 04/24/19 Psoas/sciatica pain with multiple hip surgeries No known family history of colon cancer or polyps   PLAN: - Urinalysis - Fecal calprotectin - Add daily psyllium or meythcellulose - Avoid all NSAIDs because these can trigger your colitis including diclofenac - Continue Lialda 4.8g daily (3 month supply with 3 refills) - Continue Rowasa enema PR BID PRN - Resume dicyclomine 10 mg QID taken prior to meals - Consider trial of Xifaxan with recurrent distension and bloating - Follow-up by MyChart next week - Follow-up with GYN as planned to consider endometriosis - Follow-up with urology re: hematuria and abnormal CT scan - I suggested that she go to the [facility] [place] website  - Return in 8 weeks, or earlier as needed   HPI: [name] is a 35 y.o. female with distal ulcerative colitis who returns in follow-up. She was last seen 11/23/20.  Colonoscopy 10/11/20 showed active distal colitis.  I recommended that she continue Lialda and Canasa with dicyclomine. She was seen in follow-up 11/23/20 with ongoing upper abdominal pain and "constipation" - soft, thin, long poop that happens most days. Pain does not improve with defecation.  Appetite good. Unintentional weight gain.  No extra-GI manifestations of IBD except for possible hip pain. Normally bowel frequency is 2-3 BM daily. CT abd/pelvis 12/07/20 showed decompressed urinary bladder with possible cystitis, no evidence of acute bowel  inflammation, and surgical changes with pelvic reconstruction for congential hip dysplasia.  She returns today in follow-up reporting generalized abdominal pain and altered bowel habits. She is out of her dicyclomine and she finds that it makes a big difference. She resumed Rowasa enemas last week after a 3 week hiatus related to mobility issues around her hip surgery and recovery. She reports some blood in the urine - although more acurately on the toilet bowel when she urinates.   Endoscopic history: - Colonoscopy 10/22/18: Diffuse mild inflammation characterized by erythema, friability and granularity was found in the rectum extending to 16cm from the anal verge.  The remainder of the colon appeared normal. Rectal biopsies showed moderately active chronic colitis consistent with inflammatory bowel disease.  Other biopsies taken throughout the colon and terminal ileum were normal. - Colonoscopy 10/11/20: chronic proctitis, otherwise normal colon biopsies throughout the colon  Labs 10/22/18: iron 97, ferritin 45.7, WBC 10.7, hgb 13.2, platelets 338 Labs 11/11/18: fecal calprotectin 340, GI pathogen panel negative Labs 06/09/19: normal CMP including ALT 18, AST 17, CRP <1, normal CBC, ESR 6 Labs 09/22/19: iron 120, ferritin 49.7, hemoglobin 12.6, platelets 401  Labs 11/23/20: CRP 1.57, ESR 12 Labs 01/05/21: fecal calprotectin 18  Health Maintenance:  - DEXA: N/A - Vaccinations:      - Annual Flu Vaccine  recommend, has not had one the last 2 years      - Pneumococcal Vaccine: N/A      - Covid Vaccine: declines   - Micronutrient eval:       - Annual Vit D, B6, iron panel: Ordered on last visit, not  obtained - Annual Pap (if immunosuppressed): By Dr. Philis Pique 02/11/2018 - Surveillance labs for immunomodulators: N/A - Annual depression screening: None  Past Medical History:  Diagnosis Date   ACL graft tear ([place])    left knee   Allergy    Anxiety    Complication of anesthesia    woke up during  surgery   Depression    Family history of adverse reaction to anesthesia     mom is difficult to put to sleep   IUD (intrauterine device) in place    Knee pain, right    Migraine headache    Sleep paralysis    Ulcerative proctitis [facility])     Past Surgical History:  Procedure Laterality Date   ANTERIOR CRUCIATE LIGAMENT REPAIR Left 08/10/2014   Procedure: ANTERIOR CRUCIATE LIGAMENT (ACL) REPAIR;  Surgeon: Sydnee Cabal, MD;  Location: [place] [facility];  Service: Orthopedics;  Laterality: Left;   HIP SURGERY Right 08/25/2019   PAO surgery [facility]   HIP SURGERY Bilateral 01/05/2021   [facility] had [facility] taken out   KNEE ARTHROSCOPY Left 08/10/2014   Procedure: ARTHROSCOPY KNEE DEBRIDEMENT ALLOGRAFT ACL REVISION RECONSTRUCTION;  Surgeon: Sydnee Cabal, MD;  Location: [facility];  Service: Orthopedics;  Laterality: Left;   KNEE ARTHROSCOPY W/ ACL RECONSTRUCTION Left 2006   SPINAL FUSION     widom tooth extraction     only had 1 wisdom tooth     Current Outpatient Medications  Medication Sig Dispense Refill   amphetamine-dextroamphetamine (ADDERALL) 10 MG tablet Take 1 tablet (10 mg total) by mouth daily with breakfast. 30 tablet 0   clonazePAM (KLONOPIN) 0.5 MG tablet TAKE 1 TABLET(0.5 MG) BY MOUTH DAILY AS NEEDED FOR ANXIETY 30 tablet 0   cyclobenzaprine (FLEXERIL) 10 MG tablet TAKE 1 TABLET(10 MG) BY MOUTH AT BEDTIME 30 tablet 3   dicyclomine (BENTYL) 10 MG capsule Take 2 capsules (20 mg total) by mouth 4 (four) times daily as needed for spasms. 90 capsule 0   DULoxetine (CYMBALTA) 30 MG capsule TAKE 1 CAPSULE BY MOUTH EVERY DAY 90 capsule 0   mesalamine (LIALDA) 1.2 g EC tablet TAKE 4 TABLETS(4.8 GRAMS) BY MOUTH DAILY WITH BREAKFAST 360 tablet 3   mesalamine (ROWASA) 4 g enema Place 60 mLs (4 g total) rectally 2 (two) times daily. 3600 mL 2   SUMAtriptan (IMITREX) 100 MG tablet TAKE 1 TABLET BY MOUTH IMMEDIATELY. MAY REPEAT 2 HOURS IF NEEDED FOR  MIGRAINE. 25 tablet 5   HYDROcodone-acetaminophen (NORCO/VICODIN) 5-325 MG tablet Take 1 tablet by mouth 3 (three) times daily as needed. (Patient not taking: Reported on 02/03/2021)     oxyCODONE (OXY IR/ROXICODONE) 5 MG immediate release tablet Take 5 mg by mouth 4 (four) times daily as needed. (Patient not taking: Reported on 02/03/2021)     Current Facility-Administered Medications  Medication Dose Route Frequency Provider Last Rate Last Admin   0.9 %  sodium chloride infusion  500 mL Intravenous Once Thornton Park, MD        Allergies as of 02/03/2021 - Review Complete 02/03/2021  Allergen Reaction Noted   Other Rash 04/25/2020   Tape Rash 04/25/2020   Tapentadol Rash 04/25/2020   Wound dressing adhesive Rash 04/25/2020   Molds & smuts Swelling 05/13/2017    Family History  Problem Relation Age of Onset   Goiter Father    Hyperlipidemia Father    Arthritis Mother    Hypertension Maternal Grandmother    Alzheimer's disease Maternal Grandmother  Arthritis Maternal Grandmother    Stroke Maternal Grandfather    Hypertension Maternal Grandfather    Stroke Paternal Grandmother    Breast cancer Paternal Grandmother    Migraines Other        on her father's side   Colon cancer Neg Hx    Esophageal cancer Neg Hx    Rectal cancer Neg Hx    Stomach cancer Neg Hx       PHYSICAL EXAM: Gen: Awake, alert, and oriented HEENT: EOMI, non-icteric sclera, NCAT, MMM  Neck: Normal movement of head and neck  Abd: Soft,  NT, non-distended, no rebound or guarding, normal bowel sounds Pulm: No labored breathing, speaking in full sentences without conversational dyspnea  Derm: No apparent lesions or bruising in visible field  MS: Left leg in a brace. Psych: Pleasant, cooperative, normal speech, thought processing seemingly intact     Jenina Moening L. Tarri Glenn, MD, MPH [facility] 02/03/2021, 9:11 AM

## 2021-02-03 NOTE — Patient Instructions (Addendum)
It was a pleasure to see you today.  I recommended a urinalysis to follow-up on your abnormal CT scan results. Given your recent reports of blood in the urine, I recommend that you also see a urologist.   I recommend a fecal calprotectin to monitor your ulcerative colitis.  I recommend that you continue Lialda and Rowasa.  In addition to the dicyclomine to treat your pain, we discussed starting a daily dose of psyllium or methycellulose. In addition a low FODMAP diet may minimize some of your symptoms. I gave you a brochure about this diet today to help identify foods that are more gut friendly.  The [employer] has good information about diet in the setting of colitis on their website.  Please send my a MyChart message with an update next week.  Otherwise, I'd like to see you back in 8 weeks, or earlier if needed.   It was a pleasure to see you today!  Thank you for trusting me with your gastrointestinal care!

## 2021-02-10 ENCOUNTER — Other Ambulatory Visit: Payer: Self-pay | Admitting: Registered Nurse

## 2021-02-10 DIAGNOSIS — G43109 Migraine with aura, not intractable, without status migrainosus: Secondary | ICD-10-CM

## 2021-03-01 ENCOUNTER — Encounter: Payer: Self-pay | Admitting: Gastroenterology

## 2021-04-11 ENCOUNTER — Other Ambulatory Visit: Payer: Self-pay | Admitting: Registered Nurse

## 2021-05-19 ENCOUNTER — Other Ambulatory Visit: Payer: Self-pay

## 2021-05-19 DIAGNOSIS — F988 Other specified behavioral and emotional disorders with onset usually occurring in childhood and adolescence: Secondary | ICD-10-CM

## 2021-05-19 MED ORDER — DULOXETINE HCL 30 MG PO CPEP: 30.0000 mg | ORAL_CAPSULE | Freq: Every day | ORAL | 0 refills | Status: DC

## 2021-08-30 ENCOUNTER — Ambulatory Visit: Payer: 59 | Admitting: Family Medicine

## 2021-08-30 VITALS — BP 124/80 | HR 76 | Temp 98.5°F | Resp 16 | Ht 63.0 in | Wt 139.6 lb

## 2021-08-30 DIAGNOSIS — M542 Cervicalgia: Secondary | ICD-10-CM

## 2021-08-30 DIAGNOSIS — K529 Noninfective gastroenteritis and colitis, unspecified: Secondary | ICD-10-CM

## 2021-08-30 DIAGNOSIS — M25551 Pain in right hip: Secondary | ICD-10-CM

## 2021-08-30 DIAGNOSIS — F411 Generalized anxiety disorder: Secondary | ICD-10-CM

## 2021-08-30 DIAGNOSIS — G43109 Migraine with aura, not intractable, without status migrainosus: Secondary | ICD-10-CM

## 2021-08-30 DIAGNOSIS — N8003 Adenomyosis of the uterus: Secondary | ICD-10-CM

## 2021-08-30 MED ORDER — CLONAZEPAM 0.5 MG PO TABS: ORAL_TABLET | ORAL | 1 refills | Status: DC

## 2021-08-30 MED ORDER — CYCLOBENZAPRINE HCL 10 MG PO TABS: ORAL_TABLET | ORAL | 3 refills | Status: DC

## 2021-08-30 MED ORDER — SUMATRIPTAN SUCCINATE 100 MG PO TABS
ORAL_TABLET | ORAL | 5 refills | Status: AC
Start: 2021-08-30 — End: ?

## 2021-09-06 LAB — HEMOGLOBIN A1C
Est. average glucose Bld gHb Est-mCnc: 103 mg/dL
Hgb A1c MFr Bld: 5.2 % (ref 4.8–5.6)

## 2021-09-06 LAB — ALLERGEN PROFILE, MOLD
Alternaria Alternata IgE: <0.1 kU/L
Aspergillus Fumigatus IgE: <0.1 kU/L
Aureobasidi Pullulans IgE: <0.1 kU/L
Candida Albicans IgE: <0.1 kU/L
Cladosporium Herbarum IgE: <0.1 kU/L
M009-IgE Fusarium proliferatum: <0.1 kU/L
M014-IgE Epicoccum purpur: <0.1 kU/L
Mucor Racemosus IgE: <0.1 kU/L
Penicillium Chrysogen IgE: <0.1 kU/L
Phoma Betae IgE: <0.1 kU/L
Setomelanomma Rostrat: <0.1 kU/L
Stemphylium Herbarum IgE: <0.1 kU/L

## 2021-09-06 LAB — TSH+FREE T4
Free T4: 1.15 ng/dL (ref 0.82–1.77)
TSH: 2.17 u[IU]/mL (ref 0.450–4.500)

## 2021-09-06 LAB — CBC WITH DIFFERENTIAL/PLATELET
Basophils Absolute: 0 10*3/uL (ref 0.0–0.2)
Basos: 1 %
EOS (ABSOLUTE): 0.1 10*3/uL (ref 0.0–0.4)
Eos: 1 %
Hematocrit: 46.1 % (ref 34.0–46.6)
Hemoglobin: 15.6 g/dL (ref 11.1–15.9)
Immature Grans (Abs): 0 10*3/uL (ref 0.0–0.1)
Immature Granulocytes: 0 %
Lymphocytes Absolute: 1.5 10*3/uL (ref 0.7–3.1)
Lymphs: 33 %
MCH: 31.3 pg (ref 26.6–33.0)
MCHC: 33.8 g/dL (ref 31.5–35.7)
MCV: 93 fL (ref 79–97)
Monocytes Absolute: 0.3 10*3/uL (ref 0.1–0.9)
Monocytes: 7 %
Neutrophils Absolute: 2.7 10*3/uL (ref 1.4–7.0)
Neutrophils: 58 %
Platelets: 366 10*3/uL (ref 150–450)
RBC: 4.98 x10E6/uL (ref 3.77–5.28)
RDW: 12 % (ref 11.7–15.4)
WBC: 4.6 10*3/uL (ref 3.4–10.8)

## 2021-09-06 LAB — INSULIN, FREE AND TOTAL
Free Insulin: 3.4 uU/mL
Total Insulin: 3.4 uU/mL

## 2021-09-06 LAB — LIPASE: Lipase: 33 U/L (ref 14–72)

## 2021-09-06 LAB — VITAMIN D 1,25 DIHYDROXY
Vitamin D 1, 25 (OH)2 Total: 90 pg/mL — ABNORMAL HIGH
Vitamin D2 1, 25 (OH)2: <10 pg/mL
Vitamin D3 1, 25 (OH)2: 81 pg/mL

## 2021-09-06 LAB — LEAD, BLOOD (ADULT >= 16 YRS): Lead-Whole Blood: <1 ug/dL (ref 0.0–3.4)

## 2021-09-08 ENCOUNTER — Telehealth: Payer: Self-pay | Admitting: Family Medicine

## 2021-09-14 ENCOUNTER — Other Ambulatory Visit: Payer: Self-pay

## 2021-09-14 DIAGNOSIS — M26623 Arthralgia of bilateral temporomandibular joint: Secondary | ICD-10-CM

## 2021-09-14 DIAGNOSIS — R413 Other amnesia: Secondary | ICD-10-CM

## 2021-09-14 DIAGNOSIS — G43009 Migraine without aura, not intractable, without status migrainosus: Secondary | ICD-10-CM

## 2021-09-29 ENCOUNTER — Other Ambulatory Visit: Payer: Self-pay | Admitting: Internal Medicine

## 2021-09-29 ENCOUNTER — Telehealth: Payer: Self-pay | Admitting: Family Medicine

## 2021-09-29 DIAGNOSIS — M542 Cervicalgia: Secondary | ICD-10-CM

## 2021-09-29 DIAGNOSIS — F411 Generalized anxiety disorder: Secondary | ICD-10-CM

## 2021-09-30 ENCOUNTER — Other Ambulatory Visit: Payer: Self-pay | Admitting: Family Medicine

## 2021-09-30 ENCOUNTER — Ambulatory Visit (HOSPITAL_BASED_OUTPATIENT_CLINIC_OR_DEPARTMENT_OTHER): Payer: 59

## 2021-09-30 DIAGNOSIS — G8929 Other chronic pain: Secondary | ICD-10-CM

## 2021-09-30 DIAGNOSIS — M542 Cervicalgia: Secondary | ICD-10-CM

## 2021-09-30 DIAGNOSIS — F411 Generalized anxiety disorder: Secondary | ICD-10-CM

## 2021-09-30 MED ORDER — CLONAZEPAM 0.5 MG PO TABS: 0.5000 mg | ORAL_TABLET | Freq: Every day | ORAL | 0 refills | Status: DC | PRN

## 2021-09-30 MED ORDER — CYCLOBENZAPRINE HCL 10 MG PO TABS: 10.0000 mg | ORAL_TABLET | Freq: Two times a day (BID) | ORAL | 3 refills | Status: AC | PRN

## 2021-09-30 NOTE — Telephone Encounter (Signed)
Note by Dr. Marcille Blanco is pending. We discussed patient's case.  Elwin Mocha, MD

## 2021-10-07 ENCOUNTER — Ambulatory Visit (HOSPITAL_BASED_OUTPATIENT_CLINIC_OR_DEPARTMENT_OTHER): Payer: 59

## 2021-10-09 ENCOUNTER — Ambulatory Visit (HOSPITAL_BASED_OUTPATIENT_CLINIC_OR_DEPARTMENT_OTHER)
Admission: RE | Admit: 2021-10-09 | Discharge: 2021-10-09 | Disposition: A | Discharge status: Home / Self Care | Payer: 59 | Source: Ambulatory Visit | Attending: Internal Medicine | Admitting: Internal Medicine

## 2021-10-09 DIAGNOSIS — R413 Other amnesia: Secondary | ICD-10-CM | POA: Insufficient documentation

## 2021-10-09 DIAGNOSIS — M26623 Arthralgia of bilateral temporomandibular joint: Secondary | ICD-10-CM | POA: Insufficient documentation

## 2021-10-09 DIAGNOSIS — G43009 Migraine without aura, not intractable, without status migrainosus: Secondary | ICD-10-CM | POA: Diagnosis present

## 2021-10-09 MED ORDER — IOHEXOL 300 MG/ML  SOLN
75.0000 mL | Freq: Once | INTRAMUSCULAR | Status: AC | PRN
  Administered 2021-10-09: 75 mL via INTRAVENOUS

## 2021-12-25 ENCOUNTER — Other Ambulatory Visit: Payer: Self-pay | Admitting: Family Medicine

## 2021-12-25 DIAGNOSIS — F411 Generalized anxiety disorder: Secondary | ICD-10-CM

## 2022-02-23 ENCOUNTER — Other Ambulatory Visit: Payer: Self-pay | Admitting: Gastroenterology

## 2022-02-23 ENCOUNTER — Other Ambulatory Visit: Payer: Self-pay | Admitting: Family Medicine

## 2022-02-23 DIAGNOSIS — F411 Generalized anxiety disorder: Secondary | ICD-10-CM

## 2022-03-13 ENCOUNTER — Other Ambulatory Visit: Payer: Self-pay | Admitting: Internal Medicine

## 2022-03-13 DIAGNOSIS — G43109 Migraine with aura, not intractable, without status migrainosus: Secondary | ICD-10-CM

## 2022-03-13 DIAGNOSIS — F411 Generalized anxiety disorder: Secondary | ICD-10-CM

## 2022-03-26 ENCOUNTER — Other Ambulatory Visit: Payer: Self-pay | Admitting: Orthopedic Surgery

## 2022-03-26 DIAGNOSIS — Z9889 Other specified postprocedural states: Secondary | ICD-10-CM

## 2022-04-03 ENCOUNTER — Ambulatory Visit
Admission: RE | Admit: 2022-04-03 | Discharge: 2022-04-03 | Disposition: A | Discharge status: Home / Self Care | Payer: 59 | Source: Ambulatory Visit | Attending: Orthopedic Surgery | Admitting: Orthopedic Surgery

## 2022-04-03 DIAGNOSIS — Z9889 Other specified postprocedural states: Secondary | ICD-10-CM

## 2022-04-11 ENCOUNTER — Ambulatory Visit: Payer: 59 | Admitting: Family Medicine

## 2022-04-13 ENCOUNTER — Other Ambulatory Visit: Payer: Self-pay | Admitting: Internal Medicine

## 2022-04-13 DIAGNOSIS — F411 Generalized anxiety disorder: Secondary | ICD-10-CM

## 2022-04-13 DIAGNOSIS — M542 Cervicalgia: Secondary | ICD-10-CM

## 2022-04-13 DIAGNOSIS — G43109 Migraine with aura, not intractable, without status migrainosus: Secondary | ICD-10-CM

## 2022-05-15 ENCOUNTER — Other Ambulatory Visit: Payer: Self-pay | Admitting: Family Medicine

## 2022-05-15 DIAGNOSIS — M899 Disorder of bone, unspecified: Secondary | ICD-10-CM

## 2022-05-18 ENCOUNTER — Other Ambulatory Visit: Payer: Self-pay | Admitting: Family Medicine

## 2022-05-18 ENCOUNTER — Encounter: Payer: Self-pay | Admitting: Family Medicine

## 2022-05-18 DIAGNOSIS — E2839 Other primary ovarian failure: Secondary | ICD-10-CM

## 2022-06-11 ENCOUNTER — Other Ambulatory Visit: Payer: Self-pay | Admitting: Orthopedic Surgery

## 2022-06-11 DIAGNOSIS — M25551 Pain in right hip: Secondary | ICD-10-CM

## 2022-06-18 ENCOUNTER — Ambulatory Visit
Admission: RE | Admit: 2022-06-18 | Discharge: 2022-06-18 | Disposition: A | Discharge status: Home / Self Care | Payer: 59 | Source: Ambulatory Visit | Attending: Orthopedic Surgery | Admitting: Orthopedic Surgery

## 2022-06-18 ENCOUNTER — Encounter: Payer: Self-pay | Admitting: Radiology

## 2022-06-18 DIAGNOSIS — M25551 Pain in right hip: Secondary | ICD-10-CM

## 2022-06-18 MED ORDER — IOPAMIDOL (ISOVUE-M 200) INJECTION 41%
15.0000 mL | Freq: Once | INTRAMUSCULAR | Status: AC
  Administered 2022-06-18: 15 mL via INTRA_ARTICULAR

## 2022-07-05 ENCOUNTER — Other Ambulatory Visit: Payer: Self-pay | Admitting: Gastroenterology

## 2023-01-02 ENCOUNTER — Telehealth: Payer: Self-pay | Admitting: Gastroenterology

## 2023-01-02 DIAGNOSIS — K51211 Ulcerative (chronic) proctitis with rectal bleeding: Secondary | ICD-10-CM

## 2023-01-02 DIAGNOSIS — R197 Diarrhea, unspecified: Secondary | ICD-10-CM

## 2023-01-02 NOTE — Telephone Encounter (Signed)
Inbound call from patient stating she recently had hip surgery and is having a severe ulcerative colitis flare up and is also wishing for refill for mesalamine tablets. Patient has been scheduled for 11/12 at 3:00 pm. Patient is wishing to know if she is able to have a virtual visit due to recent surgery. Requesting a call to discuss. Please advise, thank you.

## 2023-01-02 NOTE — Telephone Encounter (Signed)
Patient with hx of ulcerative proctitis last seen by Dr Tressia Danas 02/03/21  Patient calls today stating that she has been having a "severe flare of ulcerative proctitis" which began immediately following a hip surgery completed at [facility] last week. She states she has been having intense abdominal pain in the epigastrium and periumbilical area and equates this pain to how it felt when she had previous flares. Patient also says she has been having 10+ diarrheal bowel movements daily with some blood/mucus. She feels she may have a low grade fever.  Patient says that while at [facility], a GI physician ordered a CT scan which showed only large stool burden. She had an enema and since has had diarrhea.  Patient admits that she has not been taking Lialda x 6 months because she "ran out" and was unable to get refilled. States she was also at the time working with a functional physician who told her that she had celiac disease so she was working to get that controlled. States that she has been using some expired rowasa enemas over the last few days. Patient states that she has not been taking any narcotic pain meds but is unable to tell me if she is using any NSAID based medications following her surgery.  Patient was scheduled for office visit with Dr Barron Alvine but requests a virtual visit due to her very recent hip surgery.  Dr Barron Alvine- Is virtual ok in this situation or do you need to see her in the office (I did tell patient there is a possibility that we may need to physically see her due to symptoms and the fact that she has not been established with Dr C beforehand).

## 2023-01-03 NOTE — Telephone Encounter (Signed)
I have spoken to patient to advise that Dr Barron Alvine has okayed a virtual visit due to circumstance of recent Hip surgery. However, she will need to come to our lab for cbc and fecal calprotectin beforehand. Also advised diatherix testing kit will need to be picked up from the 3rd floor. Patient states she will have her husband pick up stool containers and will return tomorrow to do bloodwork.

## 2023-01-03 NOTE — Telephone Encounter (Signed)
-----   Message ----- From: Shellia Cleverly, DO Sent: 01/02/2023   4:57 PM EST To: Richardson Chiquito, RN  If she is having mobility issues due to recent hip surgery, then we can convert to virtual appointment. Please try to obtain the followign prior to the appointment though:  - GI PCR panel Diatherix - Fecal calprotectin (recent elevated CRP with normal ESR, but in the setting of hip surgery, so do not need to repeat this) - Repeat CBC to eval the leukocytosis on the recent panel

## 2023-01-03 NOTE — Telephone Encounter (Signed)
I have left a voicemail for patient to call back. 

## 2023-01-04 ENCOUNTER — Other Ambulatory Visit (INDEPENDENT_AMBULATORY_CARE_PROVIDER_SITE_OTHER): Payer: 59

## 2023-01-04 DIAGNOSIS — K51211 Ulcerative (chronic) proctitis with rectal bleeding: Secondary | ICD-10-CM | POA: Diagnosis not present

## 2023-01-04 DIAGNOSIS — R197 Diarrhea, unspecified: Secondary | ICD-10-CM

## 2023-01-04 LAB — CBC WITH DIFFERENTIAL/PLATELET
Basophils Absolute: 0.1 10*3/uL (ref 0.0–0.1)
Basophils Relative: 0.7 % (ref 0.0–3.0)
Eosinophils Absolute: 0.5 10*3/uL (ref 0.0–0.7)
Eosinophils Relative: 4.7 % (ref 0.0–5.0)
HCT: 39.2 % (ref 36.0–46.0)
Hemoglobin: 13.2 g/dL (ref 12.0–15.0)
Lymphocytes Relative: 24.7 % (ref 12.0–46.0)
Lymphs Abs: 2.4 10*3/uL (ref 0.7–4.0)
MCHC: 33.7 g/dL (ref 30.0–36.0)
MCV: 93.5 fL (ref 78.0–100.0)
Monocytes Absolute: 0.5 10*3/uL (ref 0.1–1.0)
Monocytes Relative: 5.6 % (ref 3.0–12.0)
Neutro Abs: 6.3 10*3/uL (ref 1.4–7.7)
Neutrophils Relative %: 64.3 % (ref 43.0–77.0)
Platelets: 653 10*3/uL — ABNORMAL HIGH (ref 150.0–400.0)
RBC: 4.19 Mil/uL (ref 3.87–5.11)
RDW: 12.3 % (ref 11.5–15.5)
WBC: 9.8 10*3/uL (ref 4.0–10.5)

## 2023-01-08 ENCOUNTER — Encounter: Payer: Self-pay | Admitting: Gastroenterology

## 2023-01-08 ENCOUNTER — Telehealth: Payer: 59 | Admitting: Gastroenterology

## 2023-01-08 DIAGNOSIS — R194 Change in bowel habit: Secondary | ICD-10-CM | POA: Diagnosis not present

## 2023-01-08 DIAGNOSIS — R140 Abdominal distension (gaseous): Secondary | ICD-10-CM | POA: Diagnosis not present

## 2023-01-08 DIAGNOSIS — K51211 Ulcerative (chronic) proctitis with rectal bleeding: Secondary | ICD-10-CM

## 2023-01-08 DIAGNOSIS — K921 Melena: Secondary | ICD-10-CM

## 2023-01-08 NOTE — Progress Notes (Signed)
Chief Complaint: Ulcerative Colitis, abdominal bloating   GI history: [name] is a 37 year old female with a history of Ulcerative Colitis with overlapping IBS, previously followed by Dr. Orvan Falconer, along with multiple previous hip surgeries.    IBD evaluation to date: -Diagnosed with distal UC on colonoscopy in 09/2018.  Index sxs of hematochezia, frequency, urgency. Fecal calprotectin 340 in 10/2018 at diagnosis.  Good response to medications and fecal calprotectin normalized. - TPMT: None - TB testing: None - HBV status: None - Pertinent Imaging:  - Last colonoscopy: CT A/P 11/2020: Normal-appearing GI tract - History of EIMs: None (hip dysfunction?)  Medications to date: Lialda, Canasa, Rowasa Current IBD medications: Rowasa enemas (ran out of Lialda)  Health Maintenance:  - DEXA: N/A - Vaccinations:      - Annual Flu Vaccine: To obtain this fall      - Pneumococcal Vaccine: N/A      - Covid Vaccine: declines   - Micronutrient eval:       - Annual Vit D, B6, iron panel: Obtained at follow-up - Annual Pap (if immunosuppressed): Via PCM annually - Surveillance labs for immunomodulators: N/A - Annual depression screening: None  Endoscopic Hx: - Colonoscopy 10/22/2018: Diffuse mild inflammation characterized by erythema, friability and granularity was found in the rectum extending to 16cm from the anal verge.  The remainder of the colon appeared normal. Rectal biopsies showed moderately active chronic colitis consistent with inflammatory bowel disease.  Other biopsies taken throughout the colon and terminal ileum were normal. - Colonoscopy 10/11/2020: chronic proctitis, otherwise normal colon biopsies throughout the colon.  Was continued on Lialda, Canasa    HPI:    Due to current restrictions/limitations of in-office visits due to the COVID-19 pandemic, this scheduled clinical appointment was converted to a telehealth virtual consultation using MyChart  video  -Time of medical discussion: 25 minutes -The patient did consent to this virtual visit and is aware of possible charges through their insurance for this visit.  -Names of all parties present: [name] (patient), Doristine Locks, DO, [facility] (physician) -Patient location: Home -Physician location: Office  [name] is a 37 y.o. female referred to the Gastroenterology Clinic for evaluation of Ulcerative Colitis and abdominal bloating.  She was last seen by Dr. Orvan Falconer on 02/03/2021.  At that time, was having generalized abdominal pain and altered bowel habits.  Abdominal pain was previously well-controlled with dicyclomine and returned after she ran out of Rx.  Was continued on Lialda, Rowasa, dicyclomine with consideration of Xifaxan trial pending clinical response.  Was seen by functional clinic earlier this year and diagnosed with Celiac disease (although had negative tTG IgA and total IgA in 05/2019). Has since stopped gluten-containing foods with improvement in abdominal bloating and discomfort. Has also started curcumin. Has intentionally lost 15# with dietary modifications.   Recent hip surgery at [facility] in October.  She reports postop course complicated by constipation was treated with smog enema.  Since then, has been having UC flare type sxs. Describes abdominal distension, small-volume BRB on tissue paper.  Stools are formed but difficult to have BM (tenesmus?).  Symptoms feel similar to prior flares.  Symptoms are starting to improve and has been using Rowasa enemas (ran out of Lialda).  Taking minimal amount of the postoperative pain medications.   Reviewed postop labs from 12/30/2022: - CRP 15.5 (ULN 0.85) - Normal ESR - WBC 13.1, H/H 11/33.  PLT 275 (comparison from  9/27: WBC 9.2, H/H 15/45) - Albumin 3.1, protein 5.7, otherwise normal CMP  GI PCR Diatherix panel negative earlier this week.  Repeat CBC with WBC 9.8, H/H 13/39 and PLT 653.  Fecal calprotectin  pending.  Past medical history, past surgical history, social history, family history, medications, and allergies reviewed in the chart and with patient.    Past Medical History:  Diagnosis Date   ACL graft tear (HCC)    left knee   Allergy    Anxiety    Complication of anesthesia    woke up during surgery   Depression    Family history of adverse reaction to anesthesia     mom is difficult to put to sleep   IUD (intrauterine device) in place    Knee pain, right    Migraine headache    Sleep paralysis    Ulcerative proctitis [facility])      Past Surgical History:  Procedure Laterality Date   ANTERIOR CRUCIATE LIGAMENT REPAIR Left 08/10/2014   Procedure: ANTERIOR CRUCIATE LIGAMENT (ACL) REPAIR;  Surgeon: Eugenia Mcalpine, MD;  Location: [facility] [place];  Service: Orthopedics;  Laterality: Left;   HIP SURGERY Right 08/25/2019   PAO surgery [facility] Mariana Arn   HIP SURGERY Bilateral 01/05/2021   [place], had hardware taken out   KNEE ARTHROSCOPY Left 08/10/2014   Procedure: ARTHROSCOPY KNEE DEBRIDEMENT ALLOGRAFT ACL REVISION RECONSTRUCTION;  Surgeon: Eugenia Mcalpine, MD;  Location: [facility];  Service: Orthopedics;  Laterality: Left;   KNEE ARTHROSCOPY W/ ACL RECONSTRUCTION Left 2006   SPINAL FUSION     widom tooth extraction     only had 1 wisdom tooth    Family History  Problem Relation Age of Onset   Goiter Father    Hyperlipidemia Father    Arthritis Mother    Hypertension Maternal Grandmother    Alzheimer's disease Maternal Grandmother    Arthritis Maternal Grandmother    Stroke Maternal Grandfather    Hypertension Maternal Grandfather    Stroke Paternal Grandmother    Breast cancer Paternal Grandmother    Migraines Other        on her father's side   Colon cancer Neg Hx    Esophageal cancer Neg Hx    Rectal cancer Neg Hx    Stomach cancer Neg Hx    Social History   Tobacco Use   Smoking status: Never   Smokeless tobacco: Never  Vaping  Use   Vaping status: Never Used  Substance Use Topics   Alcohol use: Yes    Comment: 4 to 6 drinks per week    Drug use: Yes    Types: Marijuana    Comment: 2 times per day-to sleep   Current Outpatient Medications  Medication Sig Dispense Refill   amphetamine-dextroamphetamine (ADDERALL) 10 MG tablet Take 1 tablet (10 mg total) by mouth daily with breakfast. 30 tablet 0   clonazePAM (KLONOPIN) 0.5 MG tablet Take 1 tablet (0.5 mg total) by mouth daily as needed for anxiety. TAKE 1 TABLET(0.5 MG) BY MOUTH DAILY AS NEEDED FOR ANXIETY 30 tablet 0   cyclobenzaprine (FLEXERIL) 10 MG tablet Take 1 tablet (10 mg total) by mouth 2 (two) times daily as needed for muscle spasms. TAKE 1 TABLET(10 MG) BY MOUTH AT BEDTIME 30 tablet 3   dicyclomine (BENTYL) 10 MG capsule Take 2 capsules (20 mg total) by mouth in the morning, at noon, in the evening, and at bedtime. 180 capsule 3   DULoxetine (CYMBALTA) 30  MG capsule Take 1 capsule (30 mg total) by mouth daily. 90 capsule 0   HYDROcodone-acetaminophen (NORCO/VICODIN) 5-325 MG tablet Take 1 tablet by mouth 3 (three) times daily as needed.     mesalamine (LIALDA) 1.2 g EC tablet TAKE 4 TABLETS(4.8 GRAMS) BY MOUTH DAILY WITH BREAKFAST. Need office visit for additional refills. 360 tablet 0   mesalamine (ROWASA) 4 g enema Place 60 mLs (4 g total) rectally 2 (two) times daily. 3600 mL 3   oxyCODONE (OXY IR/ROXICODONE) 5 MG immediate release tablet Take 5 mg by mouth 4 (four) times daily as needed.     SUMAtriptan (IMITREX) 100 MG tablet TAKE 1 TABLET IMMEDIATELY. MAY REPEAT IN 2 HRS AS NEEDED FOR MIGRAINE 25 tablet 5   Current Facility-Administered Medications  Medication Dose Route Frequency Provider Last Rate Last Admin   0.9 %  sodium chloride infusion  500 mL Intravenous Once Tressia Danas, MD       Allergies  Allergen Reactions   Other Rash    Adhesive glue in surgical adhesive   Tape Rash   Tapentadol Rash   Wound Dressing Adhesive Rash    Gluten Meal Other (See Comments)    Bloating, constipation, cramping   Molds & Smuts Swelling     Review of Systems: All systems reviewed and negative except where noted in HPI.     Physical Exam:    Complete physical exam not completed due to the nature of this telehealth communication.   Gen: Awake, alert, and oriented, and well communicative. Psych: Pleasant, cooperative, normal speech, thought processing seemingly intact   ASSESSMENT AND PLAN;   1) Ulcerative Colitis 2) Hematochezia 3) Change in bowel habits - Follow-up on pending fecal calprotectin - Placed new Rx for Lialda 4.8 g daily - Can hopefully taper off the Rowasa as the Lialda gets restarted - Can follow fecal calprotectin, ESR, CRP serially to gauge response to therapy - Continue curcumin.  Discussed some other herbal remedies used in IBD realm such as quercetin  4) Abdominal bloating 5) Abdominal distention Was reportedly diagnosed with Celiac Disease by her functional medicine provider earlier this year.  Has since been on a gluten-free diet and overall feels better.  Discussed diagnosis of celiac disease at length today, along with pros/cons of different diagnostic modalities to make that diagnosis with plan for the following: - She will obtain and send me notes and labs from her functional medicine clinic provider for review - She would very much like to know definitively whether or not she has Celiac Disease as the GFD has been quite impactful on her wife.  Discussed the gold standard test being EGD with biopsies while consuming gluten containing foods, and she would very much like to proceed with that route regardless of lab testing done by her functional medicine provider - Slowly restart gluten containing foods with plan for EGD in 4-5 weeks with random and directed biopsies  The indications, risks, and benefits of EGD were explained to the patient in detail. Risks include but are not limited to bleeding,  perforation, adverse reaction to medications, and cardiopulmonary compromise. Sequelae include but are not limited to the possibility of surgery, hospitalization, and mortality. The patient verbalized understanding and wished to proceed. All questions answered, referred to scheduler. Further recommendations pending results of the exam.     Shellia Cleverly, DO, FACG  01/08/2023, 3:20 PM   Arnaldo Natal, MD

## 2023-01-09 ENCOUNTER — Telehealth: Payer: Self-pay | Admitting: Gastroenterology

## 2023-01-09 LAB — CALPROTECTIN, FECAL: Calprotectin, Fecal: 1180 ug/g — ABNORMAL HIGH (ref 0–120)

## 2023-01-09 MED ORDER — MESALAMINE 1.2 G PO TBEC: DELAYED_RELEASE_TABLET | ORAL | 3 refills | Status: DC

## 2023-01-09 NOTE — Patient Instructions (Addendum)
_______________________________________________________  If your blood pressure at your visit was 140/90 or greater, please contact your primary care physician to follow up on this.  _______________________________________________________  If you are age 38 or older, your body mass index should be between 23-30. Your Body mass index is 22.67 kg/m. If this is out of the aforementioned range listed, please consider follow up with your Primary Care Provider.  If you are age 27 or younger, your body mass index should be between 19-25. Your Body mass index is 22.67 kg/m. If this is out of the aformentioned range listed, please consider follow up with your Primary Care Provider.   ________________________________________________________  The [facility] providers would like to encourage you to use [facility] to communicate with providers for non-urgent requests or questions.  Due to long hold times on the telephone, sending your provider a message by [facility] may be a faster and more efficient way to get a response.  Please allow 48 business hours for a response.  Please remember that this is for non-urgent requests.  _______________________________________________________  We have sent the following medications to your pharmacy for you to pick up at your convenience:  Mesalamine  You have been scheduled for an endoscopy. Please follow written instructions given to you at your visit today.  If you use inhalers (even only as needed), please bring them with you on the day of your procedure.  If you take any of the following medications, they will need to be adjusted prior to your procedure:   DO NOT TAKE 7 DAYS PRIOR TO TEST- Trulicity (dulaglutide) Ozempic, Wegovy (semaglutide) Mounjaro (tirzepatide) Bydureon Bcise (exanatide extended release)  DO NOT TAKE 1 DAY PRIOR TO YOUR TEST Rybelsus (semaglutide) Adlyxin (lixisenatide) Victoza (liraglutide) Byetta  (exanatide) ___________________________________________________________________________   Due to recent changes in healthcare laws, you may see the results of your imaging and laboratory studies on MyChart before your provider has had a chance to review them.  We understand that in some cases there may be results that are confusing or concerning to you. Not all laboratory results come back in the same time frame and the provider may be waiting for multiple results in order to interpret others.  Please give [place] 48 hours in order for your provider to thoroughly review all the results before contacting the office for clarification of your results.   It was a pleasure to see you today!  Vito Cirigliano, D.O.

## 2023-01-09 NOTE — Telephone Encounter (Signed)
Patient aware that she was scheduled for an EGD on 02-13-23 at 4pm.  Prep instructions discussed by phone and instructions sent to patient in MyChart.  Patient agreed to plan and verbalized understanding. No further questions or concerns.

## 2023-01-09 NOTE — Telephone Encounter (Signed)
Inbound call from patient, states she is returning Nicole's call

## 2023-01-09 NOTE — Addendum Note (Signed)
Addended by: Lamona Curl on: 01/09/2023 11:45 AM   Modules accepted: Orders

## 2023-01-18 ENCOUNTER — Encounter: Payer: Self-pay | Admitting: Gastroenterology

## 2023-02-13 ENCOUNTER — Encounter: Payer: Self-pay | Admitting: Gastroenterology

## 2023-02-13 ENCOUNTER — Telehealth: Payer: Self-pay | Admitting: *Deleted

## 2023-02-13 ENCOUNTER — Ambulatory Visit: Payer: 59 | Admitting: Gastroenterology

## 2023-02-13 VITALS — BP 115/81 | HR 78 | Temp 98.0°F | Resp 10 | Ht 63.0 in | Wt 130.0 lb

## 2023-02-13 DIAGNOSIS — K51211 Ulcerative (chronic) proctitis with rectal bleeding: Secondary | ICD-10-CM

## 2023-02-13 DIAGNOSIS — K259 Gastric ulcer, unspecified as acute or chronic, without hemorrhage or perforation: Secondary | ICD-10-CM

## 2023-02-13 DIAGNOSIS — R140 Abdominal distension (gaseous): Secondary | ICD-10-CM

## 2023-02-13 DIAGNOSIS — K2950 Unspecified chronic gastritis without bleeding: Secondary | ICD-10-CM

## 2023-02-13 DIAGNOSIS — K632 Fistula of intestine: Secondary | ICD-10-CM

## 2023-02-13 DIAGNOSIS — K3189 Other diseases of stomach and duodenum: Secondary | ICD-10-CM | POA: Diagnosis not present

## 2023-02-13 DIAGNOSIS — K2970 Gastritis, unspecified, without bleeding: Secondary | ICD-10-CM

## 2023-02-13 MED ORDER — PANTOPRAZOLE SODIUM 40 MG PO TBEC: DELAYED_RELEASE_TABLET | ORAL | 1 refills | Status: DC

## 2023-02-13 MED ORDER — SODIUM CHLORIDE 0.9 % IV SOLN: 500.0000 mL | Freq: Once | INTRAVENOUS | Status: DC

## 2023-02-13 NOTE — Telephone Encounter (Signed)
-----   Message from [name] sent at 02/13/2023  4:33 PM EST ----- Can you please send this patient for a CT enterography with indication for IBD with enterovesicular fistula.  Thanks.

## 2023-02-13 NOTE — Progress Notes (Signed)
Pt's states no medical or surgical changes since previsit or office visit. 

## 2023-02-13 NOTE — Telephone Encounter (Signed)
Patient has been scheduled 02/19/23 at [facility] at 1130 am, arrive at 10:15 am. NPO 4 hours.  I will contact patient tomorrow to discuss as she was sedated today for a procedure.

## 2023-02-13 NOTE — Progress Notes (Signed)
GASTROENTEROLOGY PROCEDURE H&P NOTE   Primary Care Physician: Arnaldo Natal, MD    Reason for Procedure:  Abdominal bloating, abdominal distention, evaluate for Celiac Disease  Plan:    EGD with biopsies  Patient is appropriate for endoscopic procedure(s) in the ambulatory ([facility]) setting.  The nature of the procedure, as well as the risks, benefits, and alternatives were carefully and thoroughly reviewed with the patient. Ample time for discussion and questions allowed. The patient understood, was satisfied, and agreed to proceed.     HPI: [name] is a 37 y.o. female who presents for EGD with biopsies to evaluate for possible Celiac Disease which was diagnosed serologically by functional medicine provider earlier this year.  Does have abdominal bloating and visible abdominal distention.  Plan to perform EGD with biopsies today on a gluten-containing diet.  Past Medical History:  Diagnosis Date   ACL graft tear (HCC)    left knee   Allergy    Anxiety    Complication of anesthesia    woke up during surgery   Depression    Family history of adverse reaction to anesthesia     mom is difficult to put to sleep   IUD (intrauterine device) in place    Knee pain, right    Migraine headache    Sleep paralysis    Ulcerative proctitis [facility])     Past Surgical History:  Procedure Laterality Date   ANTERIOR CRUCIATE LIGAMENT REPAIR Left 08/10/2014   Procedure: ANTERIOR CRUCIATE LIGAMENT (ACL) REPAIR;  Surgeon: Eugenia Mcalpine, MD;  Location: [facility] [place];  Service: Orthopedics;  Laterality: Left;   HIP SURGERY Right 08/25/2019   PAO surgery [facility] [address] [facility]iana Arn   HIP SURGERY Bilateral 01/05/2021   [place], had hardware taken out   KNEE ARTHROSCOPY Left 08/10/2014   Procedure: ARTHROSCOPY KNEE DEBRIDEMENT ALLOGRAFT ACL REVISION RECONSTRUCTION;  Surgeon: Eugenia Mcalpine, MD;  [facility];  Service: Orthopedics;  Laterality: Left;   KNEE  ARTHROSCOPY W/ ACL RECONSTRUCTION Left 2006   SPINAL FUSION     widom tooth extraction     only had 1 wisdom tooth     Prior to Admission medications   Medication Sig Start Date End Date Taking? Authorizing Provider  dicyclomine (BENTYL) 10 MG capsule Take 2 capsules (20 mg total) by mouth in the morning, at noon, in the evening, and at bedtime. 02/03/21  Yes Tressia Danas, MD  DULoxetine (CYMBALTA) 30 MG capsule Take 1 capsule (30 mg total) by mouth daily. 05/19/21  Yes Haydee Salter, MD  losartan (COZAAR) 25 MG tablet Take 25 mg by mouth daily. 01/09/23  Yes [provider]  amphetamine-dextroamphetamine (ADDERALL) 10 MG tablet Take 1 tablet (10 mg total) by mouth daily with breakfast. 03/11/20   Janeece Agee, NP  clonazePAM (KLONOPIN) 0.5 MG tablet Take 1 tablet (0.5 mg total) by mouth daily as needed for anxiety. TAKE 1 TABLET(0.5 MG) BY MOUTH DAILY AS NEEDED FOR ANXIETY 09/30/21   Haydee Salter, MD  cyclobenzaprine (FLEXERIL) 10 MG tablet Take 1 tablet (10 mg total) by mouth 2 (two) times daily as needed for muscle spasms. TAKE 1 TABLET(10 MG) BY MOUTH AT BEDTIME 09/30/21   Haydee Salter, MD  HYDROcodone-acetaminophen (NORCO/VICODIN) 5-325 MG tablet Take 1 tablet by mouth 3 (three) times daily as needed. 12/15/19   [provider]  mesalamine (LIALDA) 1.2 g EC tablet TAKE 4 TABLETS(4.8 GRAMS) BY MOUTH DAILY WITH BREAKFAST 01/09/23   Arely Tinner, [employer]  V, DO  mesalamine (ROWASA) 4 g enema Place 60 mLs (4 g total) rectally 2 (two) times daily. 02/03/21   Tressia Danas, MD  oxyCODONE (OXY IR/ROXICODONE) 5 MG immediate release tablet Take 5 mg by mouth 4 (four) times daily as needed. 01/04/21   [provider]  SUMAtriptan (IMITREX) 100 MG tablet TAKE 1 TABLET IMMEDIATELY. MAY REPEAT IN 2 HRS AS NEEDED FOR MIGRAINE 08/30/21   Arnaldo Natal, MD    Current Outpatient Medications  Medication Sig Dispense Refill   dicyclomine (BENTYL) 10 MG capsule Take 2  capsules (20 mg total) by mouth in the morning, at noon, in the evening, and at bedtime. 180 capsule 3   DULoxetine (CYMBALTA) 30 MG capsule Take 1 capsule (30 mg total) by mouth daily. 90 capsule 0   losartan (COZAAR) 25 MG tablet Take 25 mg by mouth daily.     amphetamine-dextroamphetamine (ADDERALL) 10 MG tablet Take 1 tablet (10 mg total) by mouth daily with breakfast. 30 tablet 0   clonazePAM (KLONOPIN) 0.5 MG tablet Take 1 tablet (0.5 mg total) by mouth daily as needed for anxiety. TAKE 1 TABLET(0.5 MG) BY MOUTH DAILY AS NEEDED FOR ANXIETY 30 tablet 0   cyclobenzaprine (FLEXERIL) 10 MG tablet Take 1 tablet (10 mg total) by mouth 2 (two) times daily as needed for muscle spasms. TAKE 1 TABLET(10 MG) BY MOUTH AT BEDTIME 30 tablet 3   HYDROcodone-acetaminophen (NORCO/VICODIN) 5-325 MG tablet Take 1 tablet by mouth 3 (three) times daily as needed.     mesalamine (LIALDA) 1.2 g EC tablet TAKE 4 TABLETS(4.8 GRAMS) BY MOUTH DAILY WITH BREAKFAST 360 tablet 3   mesalamine (ROWASA) 4 g enema Place 60 mLs (4 g total) rectally 2 (two) times daily. 3600 mL 3   oxyCODONE (OXY IR/ROXICODONE) 5 MG immediate release tablet Take 5 mg by mouth 4 (four) times daily as needed.     SUMAtriptan (IMITREX) 100 MG tablet TAKE 1 TABLET IMMEDIATELY. MAY REPEAT IN 2 HRS AS NEEDED FOR MIGRAINE 25 tablet 5   Current Facility-Administered Medications  Medication Dose Route Frequency Provider Last Rate Last Admin   0.9 %  sodium chloride infusion  500 mL Intravenous Once Tressia Danas, MD       0.9 %  sodium chloride infusion  500 mL Intravenous Once Korbin Notaro V, DO        Allergies as of 02/13/2023 - Review Complete 02/13/2023  Allergen Reaction Noted   Other Rash 04/25/2020   Tape Rash 04/25/2020   Tapentadol Rash 04/25/2020   Wound dressing adhesive Rash 04/25/2020   Gluten meal Other (See Comments) 11/23/2022   Molds & smuts Swelling 05/13/2017    Family History  Problem Relation Age of Onset    Goiter Father    Hyperlipidemia Father    Arthritis Mother    Hypertension Maternal Grandmother    Alzheimer's disease Maternal Grandmother    Arthritis Maternal Grandmother    Stroke Maternal Grandfather    Hypertension Maternal Grandfather    Stroke Paternal Grandmother    Breast cancer Paternal Grandmother    Migraines Other        on her father's side   Colon cancer Neg Hx    Esophageal cancer Neg Hx    Rectal cancer Neg Hx    Stomach cancer Neg Hx     Social History   Socioeconomic History   Marital status: Married    Spouse name: Trinna Post    Number of children: 0  Years of education: Not on file   Highest education level: Not on file  Occupational History   Not on file  Tobacco Use   Smoking status: Never   Smokeless tobacco: Never  Vaping Use   Vaping status: Never Used  Substance and Sexual Activity   Alcohol use: Yes    Comment: 4 to 6 drinks per week    Drug use: Yes    Types: Marijuana    Comment: 2 times per day-to sleep   Sexual activity: Yes    Birth control/protection: I.U.D.  Other Topics Concern   Not on file  Social History Narrative   Lives at home with her husband and pets   Right handed   Caffeine: 1-2 cups daily   Social Drivers of Health   Financial Resource Strain: Low Risk  (12/27/2022)   Received [facility]   Overall Financial Resource Strain (CARDIA)    Difficulty of Paying Living Expenses: Not hard at all  Food Insecurity: No Food Insecurity (12/27/2022)   Received from [facility]   Hunger Vital Sign    Worried About Running Out of Food in the Last Year: Never true    Ran Out of Food in the Last Year: Never true  Transportation Needs: Unknown (12/27/2022)   Received from [facility] - Transportation    In the past 12 months, has lack of transportation kept you from medical appointments or from getting medications?: No    Lack of Transportation (Non-Medical):  Not on file  Physical Activity: Not on file  Stress: Not on file  Social Connections: Unknown (07/11/2021)   [facility] from [facility]   Social Network    Social Network: Not on file  Intimate Partner Violence: Unknown (06/02/2021)   Received from [facility]   HITS    Physically Hurt: Not on file    Insult or Talk Down To: Not on file    Threaten Physical Harm: Not on file    Scream or Curse: Not on file    Physical Exam: Vital signs in last 24 hours: @BP  130/86   Pulse 80   Temp 98 F (36.7 C)   Resp 12   Ht 5\' 3"  (1.6 m)   Wt 130 lb (59 kg)   SpO2 99%   BMI 23.03 kg/m  GEN: NAD EYE: Sclerae anicteric ENT: MMM CV: Non-tachycardic Pulm: CTA b/l GI: Soft, NT/ND NEURO:  Alert & Oriented x 3   Doristine Locks, DO [facility]   02/13/2023 3:49 PM

## 2023-02-13 NOTE — Patient Instructions (Addendum)
Educational handout provided to patient related to Gastritis  Resume previous diet  Use Protonix 40mg  PO BID for 6 weeks to promote mucosal healing, then reduce to 40mg  daily.  Awaiting pathology results   YOU HAD AN ENDOSCOPIC PROCEDURE TODAY AT THE [facility]:   Refer to the procedure report that was given to you for any specific questions about what was found during the examination.  If the procedure report does not answer your questions, please call your gastroenterologist to clarify.  If you requested that your care partner not be given the details of your procedure findings, then the procedure report has been included in a sealed envelope for you to review at your convenience later.  YOU SHOULD EXPECT: Some feelings of bloating in the abdomen. Passage of more gas than usual.  Walking can help get rid of the air that was put into your GI tract during the procedure and reduce the bloating. If you had a lower endoscopy (such as a colonoscopy or flexible sigmoidoscopy) you may notice spotting of blood in your stool or on the toilet paper. If you underwent a bowel prep for your procedure, you may not have a normal bowel movement for a few days.  Please Note:  You might notice some irritation and congestion in your nose or some drainage.  This is from the oxygen used during your procedure.  There is no need for concern and it should clear up in a day or so.  SYMPTOMS TO REPORT IMMEDIATELY:  Following upper endoscopy (EGD)  Vomiting of blood or coffee ground material  New chest pain or pain under the shoulder blades  Painful or persistently difficult swallowing  New shortness of breath  Fever of 100F or higher  Black, tarry-looking stools  For urgent or emergent issues, a gastroenterologist can be reached at any hour by calling [phone]. Do not use MyChart messaging for urgent concerns.    DIET:  We do recommend a small meal at first, but then you may proceed to your  regular diet.  Drink plenty of fluids but you should avoid alcoholic beverages for 24 hours.  ACTIVITY:  You should plan to take it easy for the rest of today and you should NOT DRIVE or use heavy machinery until tomorrow (because of the sedation medicines used during the test).    FOLLOW UP: Our staff will call the number listed on your records the next business day following your procedure.  We will call around 7:15- 8:00 am to check on you and address any questions or concerns that you may have regarding the information given to you following your procedure. If we do not reach you, we will leave a message.     If any biopsies were taken you will be contacted by phone or by letter within the next 1-3 weeks.  Please call us at [phone] if you have not heard about the biopsies in 3 weeks.    SIGNATURES/CONFIDENTIALITY: You and/or your care partner have signed paperwork which will be entered into your electronic medical record.  These signatures attest to the fact that that the information above on your After Visit Summary has been reviewed and is understood.  Full responsibility of the confidentiality of this discharge information lies with you and/or your care-partner.

## 2023-02-13 NOTE — Progress Notes (Signed)
Report to PACU, RN, vss, BBS= Clear.  

## 2023-02-13 NOTE — Progress Notes (Signed)
Called to room to assist during endoscopic procedure.  Patient ID and intended procedure confirmed with present staff. Received instructions for my participation in the procedure from the performing physician.  

## 2023-02-13 NOTE — Op Note (Signed)
[facility] Patient Name: [name] Procedure Date: 02/13/2023 3:41 PM MRN: [id number] Endoscopist: Doristine Locks , MD, [id number] Age: 37 Referring MD:  Date of Birth: [birth year] Gender: Female Account #: [id number] Procedure:                Upper GI endoscopy Indications:              Abdominal bloating, abdominal distension, rule out                            Celiac Disease Medicines:                Monitored Anesthesia Care Procedure:                Pre-Anesthesia Assessment:                           - Prior to the procedure, a History and Physical                            was performed, and patient medications and                            allergies were reviewed. The patient's tolerance of                            previous anesthesia was also reviewed. The risks                            and benefits of the procedure and the sedation                            options and risks were discussed with the patient.                            All questions were answered, and informed consent                            was obtained. Prior Anticoagulants: The patient has                            taken no anticoagulant or antiplatelet agents. ASA                            Grade Assessment: II - A patient with mild systemic                            disease. After reviewing the risks and benefits,                            the patient was deemed in satisfactory condition to                            undergo the procedure.  After obtaining informed consent, the endoscope was                            passed under direct vision. Throughout the                            procedure, the patient's blood pressure, pulse, and                            oxygen saturations were monitored continuously. The                            Olympus Scope [id number] was introduced through the                            mouth, and advanced to the third  part of duodenum.                            The upper GI endoscopy was accomplished without                            difficulty. The patient tolerated the procedure                            well. Scope In: Scope Out: Findings:                 The examined esophagus was normal.                           The Z-line was regular and was found 39 cm from the                            incisors.                           Moderate inflammation characterized by congestion                            (edema), erosions, erythema and shallow ulcerations                            was found in the gastric body and in the gastric                            antrum. This was more pronounced in the distal                            antrum. Biopsies were taken with a cold forceps for                            histology and Helicobacter pylori testing.                            Estimated blood loss was minimal.  The examined duodenum was normal. Biopsies for                            histology were taken with a cold forceps for                            evaluation of celiac disease. Estimated blood loss                            was minimal. Complications:            No immediate complications. Estimated Blood Loss:     Estimated blood loss was minimal. Impression:               - Normal esophagus.                           - Z-line regular, 39 cm from the incisors.                           - Moderate gastritis with scattered shallow ulcers                            and erosions. Biopsied.                           - Normal examined duodenum. Biopsied. Recommendation:           - Patient has a contact number available for                            emergencies. The signs and symptoms of potential                            delayed complications were discussed with the                            patient. Return to normal activities tomorrow.                             Written discharge instructions were provided to the                            patient.                           - Resume previous diet.                           - Use Protonix (pantoprazole) 40 mg PO BID for 6                            weeks to promote mucosal healing, then reduce to 40                            mg daily.                           -  Await pathology results.                           - Return to GI clinic at the next routine available                            appointment. Doristine Locks, MD 02/13/2023 4:13:47 PM

## 2023-02-14 ENCOUNTER — Telehealth: Payer: Self-pay

## 2023-02-14 NOTE — Telephone Encounter (Signed)
Patient has been advised of CT enterography appointment time/date/location/prep. She verbalizes understanding and is in agreement with this plan.

## 2023-02-14 NOTE — Telephone Encounter (Signed)
  Follow up Call-     02/13/2023    3:27 PM 10/11/2020    2:38 PM  Call back number  Post procedure Call Back phone  # [phone]  Permission to leave phone message Yes Yes     Left message

## 2023-02-18 ENCOUNTER — Encounter: Payer: 59 | Admitting: Gastroenterology

## 2023-02-19 ENCOUNTER — Ambulatory Visit (HOSPITAL_COMMUNITY)
Admission: RE | Admit: 2023-02-19 | Discharge: 2023-02-19 | Disposition: A | Discharge status: Home / Self Care | Payer: 59 | Source: Ambulatory Visit | Attending: Gastroenterology | Admitting: Gastroenterology

## 2023-02-19 ENCOUNTER — Encounter (HOSPITAL_COMMUNITY): Payer: Self-pay

## 2023-02-19 DIAGNOSIS — K632 Fistula of intestine: Secondary | ICD-10-CM | POA: Diagnosis present

## 2023-02-19 DIAGNOSIS — K51211 Ulcerative (chronic) proctitis with rectal bleeding: Secondary | ICD-10-CM | POA: Diagnosis present

## 2023-02-19 LAB — SURGICAL PATHOLOGY

## 2023-02-19 MED ORDER — IOHEXOL 300 MG/ML  SOLN
100.0000 mL | Freq: Once | INTRAMUSCULAR | Status: AC | PRN
  Administered 2023-02-19: 100 mL via INTRAVENOUS

## 2023-02-19 MED ORDER — BARIUM SULFATE 0.1 % PO SUSP
ORAL | Status: AC
  Filled 2023-02-19: qty 3

## 2023-03-22 ENCOUNTER — Encounter (HOSPITAL_COMMUNITY): Payer: Self-pay | Admitting: *Deleted

## 2023-03-22 ENCOUNTER — Emergency Department (HOSPITAL_COMMUNITY)
Admission: EM | Admit: 2023-03-22 | Discharge: 2023-03-23 | Disposition: A | Discharge status: Home / Self Care | Payer: 59 | Attending: Emergency Medicine | Admitting: Emergency Medicine

## 2023-03-22 ENCOUNTER — Other Ambulatory Visit: Payer: Self-pay

## 2023-03-22 DIAGNOSIS — F419 Anxiety disorder, unspecified: Secondary | ICD-10-CM | POA: Insufficient documentation

## 2023-03-22 DIAGNOSIS — R221 Localized swelling, mass and lump, neck: Secondary | ICD-10-CM | POA: Diagnosis present

## 2023-03-22 DIAGNOSIS — T7840XA Allergy, unspecified, initial encounter: Secondary | ICD-10-CM | POA: Insufficient documentation

## 2023-03-22 MED ORDER — LORAZEPAM 0.5 MG PO TABS
0.5000 mg | ORAL_TABLET | Freq: Once | ORAL | Status: AC
  Administered 2023-03-22: 0.5 mg via ORAL
  Filled 2023-03-22: qty 1

## 2023-03-22 MED ORDER — DEXAMETHASONE 4 MG PO TABS
8.0000 mg | ORAL_TABLET | Freq: Once | ORAL | Status: AC
  Administered 2023-03-22: 8 mg via ORAL
  Filled 2023-03-22: qty 2

## 2023-03-22 NOTE — ED Triage Notes (Signed)
Pt c/o throat swelling and feeling like she might swallow her tongue. Has been working with her PCP for potential MAST cell activation diagnosis. Reports syncope 3 weeks ago after taking a THC substance. Again had sip of another drink with THC then felt like the back of her throat was swelling. No noticeable swelling. MSE Completed

## 2023-03-23 NOTE — ED Provider Notes (Signed)
[place] EMERGENCY DEPARTMENT AT [facility] Provider Note   CSN: [id number] Arrival date & time: 03/22/23  2309     History  Chief Complaint  Patient presents with   Oral Swelling    [name] is a 38 y.o. female.  Patient presents to the emergency room complaining of a feeling of throat swelling.  She states that she had multiple episodes of this increasing in frequency over the past few weeks.  She states that approximate 3 weeks ago she had a syncopal episode secondary to trying THC.  She does endorse taking a small of THC tonight but states she has decreased her intake significantly over the past few weeks.  She also endorses eating sushi tonight.  Upon arrival the patient is taking in full sentences and in no obvious respiratory distress.  She states that her primary team is working her up for possible mast cell activation syndrome.  Past medical history otherwise significant for generalized anxiety (patient out of Klonopin), migraines, bilateral hip surgery  HPI     Home Medications Prior to Admission medications   Medication Sig Start Date End Date Taking? Authorizing Provider  amphetamine-dextroamphetamine (ADDERALL) 10 MG tablet Take 1 tablet (10 mg total) by mouth daily with breakfast. 03/11/20   Janeece Agee, NP  clonazePAM (KLONOPIN) 0.5 MG tablet Take 1 tablet (0.5 mg total) by mouth daily as needed for anxiety. TAKE 1 TABLET(0.5 MG) BY MOUTH DAILY AS NEEDED FOR ANXIETY 09/30/21   Haydee Salter, MD  cyclobenzaprine (FLEXERIL) 10 MG tablet Take 1 tablet (10 mg total) by mouth 2 (two) times daily as needed for muscle spasms. TAKE 1 TABLET(10 MG) BY MOUTH AT BEDTIME 09/30/21   Haydee Salter, MD  dicyclomine (BENTYL) 10 MG capsule Take 2 capsules (20 mg total) by mouth in the morning, at noon, in the evening, and at bedtime. 02/03/21   Tressia Danas, MD  DULoxetine (CYMBALTA) 30 MG capsule Take 1 capsule (30 mg total) by mouth daily. 05/19/21   Haydee Salter, MD  HYDROcodone-acetaminophen (NORCO/VICODIN) 5-325 MG tablet Take 1 tablet by mouth 3 (three) times daily as needed. 12/15/19   [provider]  losartan (COZAAR) 25 MG tablet Take 25 mg by mouth daily. 01/09/23   [provider]  mesalamine (LIALDA) 1.2 g EC tablet TAKE 4 TABLETS(4.8 GRAMS) BY MOUTH DAILY WITH BREAKFAST 01/09/23   Cirigliano, Vito V, DO  mesalamine (ROWASA) 4 g enema Place 60 mLs (4 g total) rectally 2 (two) times daily. 02/03/21   Tressia Danas, MD  oxyCODONE (OXY IR/ROXICODONE) 5 MG immediate release tablet Take 5 mg by mouth 4 (four) times daily as needed. 01/04/21   [provider]  pantoprazole (PROTONIX) 40 MG tablet Take 1 tablet (40 mg total) by mouth 2 (two) times daily for 42 days, THEN 1 tablet (40 mg total) daily. Use Protonix 40 mg PO BID for 6 weeks then reduce to 40mg  daily.. 02/13/23 06/25/23  Cirigliano, Vito V, DO  SUMAtriptan (IMITREX) 100 MG tablet TAKE 1 TABLET IMMEDIATELY. MAY REPEAT IN 2 HRS AS NEEDED FOR MIGRAINE 08/30/21   Arnaldo Natal, MD      Allergies    Other, Tape, Tapentadol, Wound dressing adhesive, Gluten meal, and Molds & smuts    Review of Systems   Review of Systems  Physical Exam Updated Vital Signs BP 129/87   Pulse 98   Temp 98.4 F (36.9 C) (Oral)   Resp 17   SpO2 96%  Physical Exam Vitals and nursing note reviewed.  Constitutional:      General: She is not in acute distress.    Appearance: She is well-developed.  HENT:     Head: Normocephalic and atraumatic.     Mouth/Throat:     Comments: No angioedema, tongue swelling Eyes:     Conjunctiva/sclera: Conjunctivae normal.  Cardiovascular:     Rate and Rhythm: Normal rate and regular rhythm.     Comments: Initially tachycardic, improved to normal rate Pulmonary:     Effort: Pulmonary effort is normal. No respiratory distress.     Breath sounds: Normal breath sounds. No stridor. No wheezing.  Musculoskeletal:        General: No  swelling.     Cervical back: Neck supple.  Skin:    General: Skin is warm and dry.     Capillary Refill: Capillary refill takes less than 2 seconds.  Neurological:     Mental Status: She is alert.  Psychiatric:        Mood and Affect: Mood normal.     ED Results / Procedures / Treatments   Labs (all labs ordered are listed, but only abnormal results are displayed) Labs Reviewed - No data to display  EKG None  Radiology No results found.  Procedures Procedures    Medications Ordered in ED Medications  LORazepam (ATIVAN) tablet 0.5 mg (0.5 mg Oral Given 03/22/23 2339)  dexamethasone (DECADRON) tablet 8 mg (8 mg Oral Given 03/22/23 2339)    ED Course/ Medical Decision Making/ A&P                                 Medical Decision Making Risk Prescription drug management.   This patient presents to the ED for concern of feelings of throat swelling, this involves an extensive number of treatment options, and is a complaint that carries with it a high risk of complications and morbidity.  The differential diagnosis includes anaphylaxis, angioedema, anxiety, others   Co morbidities that complicate the patient evaluation  Possible mast cell activation syndrome, anxiety   Additional history obtained:  Additional history obtained from visitor at bedside   Problem List / ED Course / Critical interventions / Medication management   I ordered medication including Decadron and Ativan for possible allergic reaction and anxiety Reevaluation of the patient after these medicines showed that the patient improved I have reviewed the patients home medicines and have made adjustments as needed   Test / Admission - Considered:  Patient with no clinical signs of allergic reaction or anaphylaxis.  No hives, angioedema.  Lungs are clear to auscultation bilaterally.  No stridor appreciated.  Patient states she now feels that her muscles may be spasming throughout her entire body.   She endorses having a prescription for Flexeril at home which she will try.  Patient advised to follow-up with her primary care provider for further evaluation of her frequent allergic episodes.  Return precautions provided including shortness of breath.         Final Clinical Impression(s) / ED Diagnoses Final diagnoses:  Anxiety  Allergic reaction, initial encounter    Rx / DC Orders ED Discharge Orders     None         Pamala Duffel 03/23/23 Terrence Dupont, April, MD 03/23/23 0246

## 2023-03-23 NOTE — Discharge Instructions (Signed)
Please follow-up with your primary care provider for further evaluation of your frequent allergic reaction episodes.  It is unclear what is causing these episodes.  Your vitals aIf you develop any life-threatening symptoms such as shortness of breath please return to the emergency department.  nd physical exam are reassuring.

## 2023-04-05 NOTE — Progress Notes (Signed)
 Chief Complaint:   Chief Complaint  Patient presents with  . Right Hip - Follow-up    Subjective   HPI  [name] is a 38 y.o. female who presents for Follow-up of the Right Hip History of Present Illness [name], a patient with a recent history of revision arthroscopy and treatment for ischial nonunion, presents for a three-month follow-up. She reports a slow but steady improvement in her condition, despite having been diagnosed with mast cell activation and experiencing other health issues.  Her primary concern is persistent pain in various locations. She describes discomfort near the hamstring and in the buttock. Additionally, she reports anterior pain, which she believes may be more related to the hip flexor. Despite these symptoms, imaging shows that the hardware is intact and there is progressive healing of the ischial nonunion.  Review of Systems  Patient Active Problem List  Diagnosis  . Degenerative tear of acetabular labrum of left hip  . Degenerative tear of acetabular labrum of right hip  . Hip dysplasia, acquired, left  . History of angioedema  . Chronic migraine without aura without status migrainosus, not intractable  . Chronic sinusitis  . GAD (generalized anxiety disorder)  . History of fusion of cervical spine  . Ulcerative colitis (CMS/HHS-HCC)  . Femoroacetabular impingement of left hip  . History of marijuana use  . Femoroacetabular impingement of right hip  . Mast cell activation syndrome (CMS/HHS-HCC)    Outpatient Medications Prior to Visit  Medication Sig Dispense Refill  . acetaminophen  (TYLENOL ) 500 MG tablet Take 1,000 mg by mouth every 8 (eight) hours as needed for Pain    . aspirin  325 MG EC tablet Take 1 tablet (325 mg total) by mouth 2 (two) times daily 60 tablet 0  . calcium carbonate 500 mg calcium (1,250 mg) tablet Take 1,000 mg of elemental by mouth once daily    . cholecalciferol (VITAMIN D3) 1000 unit tablet Take 1,000 Units by mouth once  daily    . clonazePAM  (KLONOPIN ) 0.5 MG tablet Take 0.5 mg by mouth 2 (two) times daily as needed       . cyclobenzaprine  (FLEXERIL ) 10 MG tablet Take 1 tablet (10 mg total) by mouth 2 (two) times daily as needed for Muscle spasms 30 tablet 1  . dextroamphetamine -amphetamine  (ADDERALL) 10 mg tablet Take 10 mg by mouth as needed    . DULoxetine  (CYMBALTA ) 30 MG DR capsule Take 30 mg by mouth daily with lunch    . gabapentin (NEURONTIN) 300 MG capsule Take 1 capsule (300 mg total) by mouth 3 (three) times daily for 90 days 90 capsule 2  . omega-3 acid ethyl esters (LOVAZA) 1 gram capsule Take 1 g by mouth once daily    . polyethylene glycol (MIRALAX) powder Take 1 capful (17 g total) by mouth once daily Mix in 4-8ounces of fluid prior to taking. 510 g 0  . sennosides-docusate (SENOKOT-S) 8.6-50 mg tablet Take 1 tablet by mouth 2 (two) times daily 30 tablet 0  . sulfaSALAzine (AZULFIDINE) 500 mg tablet TAKE 1 TABLET BY MOUTH THREE TIMES DAILY BEFORE MEALS    . SUMAtriptan  (IMITREX ) 100 MG tablet Take 100 mg by mouth as needed       . turmeric (CURCUMIN MISC) Use 1 tablet once daily    . losartan (COZAAR) 25 MG tablet Take 0.5 tablets (12.5 mg total) by mouth 2 (two) times daily for 21 days 21 tablet 0  . meloxicam (MOBIC) 7.5 MG tablet Take 1 tablet (7.5 mg  total) by mouth once daily (Patient not taking: Reported on 04/05/2023) 30 tablet 0   No facility-administered medications prior to visit.      Objective   Vitals:   04/05/23 1246  PainSc:   7  PainLoc: Hip   There is no height or weight on file to calculate BMI.  Home Vitals:     Physical Exam Physical Exam Healed incisions.  No pain with ROM.  Pain with active straight leg raise  Constitutional: alert, in NAD, and communicates well Eye exam: pupils equal and reactive, extraocular eye movements intact. Lower extremities: no lower extremity edema Skin ankles/feet: warm, good capillary refill and no ulcerations or lesions  noted Neurological: sensorimotor grossly intact and normal muscle tone  Results RADIOLOGY Hip X-ray: Hardware intact with progressive healing of ischial nonunion     Assessment/Plan:   Assessment & Plan Ischial Nonunion Post Revision Arthroscopy Slowly improving with persistent pain in the hamstring and buttock region. Anterior pain possibly related to hip flexor. Imaging shows intact hardware and progressive healing. -Continue to increase activities as tolerated. -Continue with physical therapy. -Follow up in three months.  Mast Cell Activation Syndrome Recently diagnosed. -No specific plan discussed.  Anticipated Start of Aquatic Physical Therapy Scheduled to start next week. -Encouraged to continue as planned.  Diagnoses and all orders for this visit:  Status post arthroscopy of hip -     X-ray pelvis 3 plus views; Future        This visit was coded based on medical decision making (MDM).           Future Appointments   This patient does not currently have any appointments scheduled.     There are no Patient Instructions on file for this visit.  An after visit summary was provided for the patient either in written format (printed) or through MyChart.  This note has been created using automated tools and reviewed for accuracy by BRIAN DAVID LEWIS.

## 2023-04-06 ENCOUNTER — Ambulatory Visit (HOSPITAL_BASED_OUTPATIENT_CLINIC_OR_DEPARTMENT_OTHER): Payer: 59 | Admitting: Physical Therapy

## 2023-04-09 ENCOUNTER — Ambulatory Visit (INDEPENDENT_AMBULATORY_CARE_PROVIDER_SITE_OTHER): Payer: 59 | Admitting: Internal Medicine

## 2023-04-09 ENCOUNTER — Encounter: Payer: Self-pay | Admitting: Internal Medicine

## 2023-04-09 ENCOUNTER — Other Ambulatory Visit: Payer: Self-pay

## 2023-04-09 VITALS — BP 118/78 | HR 93 | Temp 98.2°F | Resp 16 | Ht 63.58 in | Wt 135.3 lb

## 2023-04-09 DIAGNOSIS — R002 Palpitations: Secondary | ICD-10-CM

## 2023-04-09 DIAGNOSIS — R09A2 Foreign body sensation, throat: Secondary | ICD-10-CM

## 2023-04-09 DIAGNOSIS — T783XXA Angioneurotic edema, initial encounter: Secondary | ICD-10-CM

## 2023-04-09 DIAGNOSIS — T783XXD Angioneurotic edema, subsequent encounter: Secondary | ICD-10-CM

## 2023-04-09 DIAGNOSIS — J3089 Other allergic rhinitis: Secondary | ICD-10-CM

## 2023-04-09 NOTE — Progress Notes (Signed)
NEW PATIENT  Date of Service/Encounter:  04/09/23  Consult requested by: Arnaldo Natal, MD   Subjective:   [name] (DOB: [birth year]) is a 38 y.o. female who presents to the clinic on 04/09/2023 with a chief complaint of Rash and Allergic Reaction .    History obtained from: chart review and patient.  Throat Swelling/Rashes/GI Symptoms: Last year, saw functional doctor at [facility] and was having a lot of stomach problems and was told she has high inflammation, high histamine and celiac.  Has ulcerative colitis and is following with GI.  Does smoke weed.  Does note tongue swelling/throat/esophagus swelling with smoking about 15 years ago and it resolved about 2 hours later once her husband calmed her down. This has recurred about a month ago after smoking or ingesting weed; once she even passed out and felt the sensation of throat swelling. Felt very anxious.  Since then, has stopped use of marijuana.   Does get lightheaded, dizzy, flushed intermittently that helps with peppermint/ice pack.  Also gets palpitations.   Sometimes has symptoms with scent of candle.   Does eat red meats Has noticed it with fish/shellfish also but not everytime . Has also noted lots of phlegm down the back of her throat, drainage,congestion, sinus/nose pressure.  Also seen ENT for this and is discussing with GI for EGD.  Previously underwent allergy testing many years ago but can't recall results. Not seasonal.  Started on Zyrtec 10mg  daily. Previously on benadryl.  PCP wonders if this is mast cell activation syndrome.   Reviewed:  03/26/2023: seen by [facility] ENT with recurrent throat/esophageal swelling; recently happened after marijuana use, sometimes with alcohol, seafood. Discussed possibility of LPR/GERD due to copious pharyngeal mucous but otherwise normal scope. Discussed follow up with GI for EGD and allergy for [facility].   01/08/2023: seen by GI Dr Barron Alvine for ulcerative colitis, started on  Liadla with plans to obtain inflammatory labs.   08/30/2021: mold profile IgE negative  11/12/2017: seen by Dr Nunzio Cobbs for chronic rhinitis, negative testing.  Also with angioedema? Plan to check labs but never performed.   Past Medical History: Past Medical History:  Diagnosis Date   ACL graft tear (HCC)    left knee   Allergy    Angio-edema    Anxiety    Complication of anesthesia    woke up during surgery   Depression    Family history of adverse reaction to anesthesia     mom is difficult to put to sleep   IUD (intrauterine device) in place    Knee pain, right    Migraine headache    Sleep paralysis    Ulcerative proctitis (HCC)     Past Surgical History: Past Surgical History:  Procedure Laterality Date   ANTERIOR CRUCIATE LIGAMENT REPAIR Left 08/10/2014   Procedure: ANTERIOR CRUCIATE LIGAMENT (ACL) REPAIR;  Surgeon: Eugenia Mcalpine, MD;  Location: [facility];  Service: Orthopedics;  Laterality: Left;   HIP SURGERY Right 08/25/2019   PAO surgery [facility] Arn   HIP SURGERY Bilateral 01/05/2021   [facility], had hardware taken out   KNEE ARTHROSCOPY Left 08/10/2014   Procedure: ARTHROSCOPY KNEE DEBRIDEMENT ALLOGRAFT ACL REVISION RECONSTRUCTION;  Surgeon: Eugenia Mcalpine, MD;  Location: [facility];  Service: Orthopedics;  Laterality: Left;   KNEE ARTHROSCOPY W/ ACL RECONSTRUCTION Left 2006   SPINAL FUSION     widom tooth extraction     only had 1 wisdom tooth  Family History: Family History  Problem Relation Age of Onset   Asthma Mother    Allergic rhinitis Mother    Arthritis Mother    Eczema Father    Goiter Father    Hyperlipidemia Father    Hypertension Maternal Grandmother    Alzheimer's disease Maternal Grandmother    Arthritis Maternal Grandmother    Stroke Maternal Grandfather    Hypertension Maternal Grandfather    Stroke Paternal Grandmother    Breast cancer Paternal Grandmother    Migraines Other        on her  father's side   Colon cancer Neg Hx    Esophageal cancer Neg Hx    Rectal cancer Neg Hx    Stomach cancer Neg Hx    Angioedema Neg Hx    Urticaria Neg Hx      Medication List:  Allergies as of 04/09/2023       Reactions   Other Rash   Adhesive glue in surgical adhesive   Tape Rash   Tapentadol Rash   Wound Dressing Adhesive Rash   Gluten Meal Other (See Comments)   Bloating, constipation, cramping   Molds & Smuts Swelling        Medication List        Accurate as of April 09, 2023  4:44 PM. If you have any questions, ask your nurse or doctor.          amphetamine-dextroamphetamine 10 MG tablet Commonly known as: Adderall Take 1 tablet (10 mg total) by mouth daily with breakfast.   clonazePAM 0.5 MG tablet Commonly known as: KLONOPIN Take 1 tablet (0.5 mg total) by mouth daily as needed for anxiety. TAKE 1 TABLET(0.5 MG) BY MOUTH DAILY AS NEEDED FOR ANXIETY   cyclobenzaprine 10 MG tablet Commonly known as: FLEXERIL Take 1 tablet (10 mg total) by mouth 2 (two) times daily as needed for muscle spasms. TAKE 1 TABLET(10 MG) BY MOUTH AT BEDTIME   dicyclomine 10 MG capsule Commonly known as: BENTYL Take 2 capsules (20 mg total) by mouth in the morning, at noon, in the evening, and at bedtime.   DULoxetine 30 MG capsule Commonly known as: CYMBALTA Take 1 capsule (30 mg total) by mouth daily.   HYDROcodone-acetaminophen 5-325 MG tablet Commonly known as: NORCO/VICODIN Take 1 tablet by mouth 3 (three) times daily as needed.   losartan 25 MG tablet Commonly known as: COZAAR Take 25 mg by mouth daily.   mesalamine 4 g enema Commonly known as: ROWASA Place 60 mLs (4 g total) rectally 2 (two) times daily.   mesalamine 1.2 g EC tablet Commonly known as: LIALDA TAKE 4 TABLETS(4.8 GRAMS) BY MOUTH DAILY WITH BREAKFAST   oxyCODONE 5 MG immediate release tablet Commonly known as: Oxy IR/ROXICODONE Take 5 mg by mouth 4 (four) times daily as needed.    pantoprazole 40 MG tablet Commonly known as: PROTONIX Take 1 tablet (40 mg total) by mouth 2 (two) times daily for 42 days, THEN 1 tablet (40 mg total) daily. Use Protonix 40 mg PO BID for 6 weeks then reduce to 40mg  daily.. Start taking on: February 13, 2023   sulfaSALAzine 500 MG tablet Commonly known as: AZULFIDINE TAKE 1 TABLET BY MOUTH THREE TIMES DAILY BEFORE MEALS   SUMAtriptan 100 MG tablet Commonly known as: IMITREX TAKE 1 TABLET IMMEDIATELY. MAY REPEAT IN 2 HRS AS NEEDED FOR MIGRAINE         REVIEW OF SYSTEMS: Pertinent positives and negatives discussed in HPI.  Objective:   Physical Exam: BP 118/78 (BP Location: Right Arm, Patient Position: Sitting, Cuff Size: Normal)   Pulse 93   Temp 98.2 F (36.8 C) (Temporal)   Resp 16   Ht 5' 3.58" (1.615 m)   Wt 135 lb 4.8 oz (61.4 kg)   SpO2 95%   BMI 23.53 kg/m  Body mass index is 23.53 kg/m. GEN: alert, well developed HEENT: clear conjunctiva, nose with + mild inferior turbinate hypertrophy, pink nasal mucosa, slight clear rhinorrhea, + cobblestoning HEART: regular rate and rhythm, no murmur LUNGS: clear to auscultation bilaterally, no coughing, unlabored respiration ABDOMEN: soft, non distended  SKIN: no rashes or lesions   Assessment:   1. Other allergic rhinitis   2. Globus sensation   3. Angioedema, initial encounter   4. Palpitations     Plan/Recommendations:  Globus Sensation/Throat Swelling/Palpitations/GI Symptoms - For now avoid smoking, marijuana use, strong scents.  - Will do workup for mast cell activation disease with baseline tryptase and 24 hour urine.  Will also check for genetic causes of swelling with C1 esterase function, alpha gal allergy and ANA for inflammatory causes.  - GERD/reflux can also be causing globus sensation. She is following up with GI.    Other Allergic Rhinitis: - Due to turbinate hypertrophy, seasonal symptoms and unresponsive to over the counter meds, will perform  skin testing to identify aeroallergen triggers.   - Use Zyrtec 10 mg daily.  - Hold all anti-histamines (Xyzal, Allegra, Zyrtec, Claritin, Benadryl, Pepcid) 3 days prior to next visit.    Follow up: 9 AM for 2/18 for skin testing 1-68    Alesia Morin, MD [facility]

## 2023-04-09 NOTE — Patient Instructions (Addendum)
Globus Sensation - For now avoid smoking, marijuana use, strong scents.  - Will do workup for mast cell activation disease with bloodwork and 24 hour urine.  - GERD/reflux can also be causing this. She is following up with GI.    Other Allergic Rhinitis: - Use Zyrtec 10 mg daily.  - Hold all anti-histamines (Xyzal, Allegra, Zyrtec, Claritin, Benadryl, Pepcid) 3 days prior to next visit.    Follow up: 9 AM for 2/18 for skin testing

## 2023-04-12 LAB — ALPHA-GAL PANEL: IgE (Immunoglobulin E), Serum: 4 [IU]/mL — ABNORMAL LOW (ref 6–495)

## 2023-04-12 LAB — C1 ESTERASE INHIBITOR, FUNCTIONAL

## 2023-04-12 LAB — TRYPTASE: Tryptase: 5.9 ug/L (ref 2.2–13.2)

## 2023-04-12 LAB — ANA W/REFLEX: Anti Nuclear Antibody (ANA): NEGATIVE

## 2023-04-16 ENCOUNTER — Ambulatory Visit (INDEPENDENT_AMBULATORY_CARE_PROVIDER_SITE_OTHER): Payer: 59 | Admitting: Internal Medicine

## 2023-04-16 DIAGNOSIS — J393 Upper respiratory tract hypersensitivity reaction, site unspecified: Secondary | ICD-10-CM

## 2023-04-16 DIAGNOSIS — J3089 Other allergic rhinitis: Secondary | ICD-10-CM | POA: Diagnosis not present

## 2023-04-16 DIAGNOSIS — T783XXD Angioneurotic edema, subsequent encounter: Secondary | ICD-10-CM

## 2023-04-16 NOTE — Patient Instructions (Addendum)
Globus Sensation - Continue avoidance of smoking, marijuana use, strong scents.  - GERD/reflux can also be a cause for globus sensation. She is following up with GI.   - Will call back with urine study results   Chronic Rhinitis: - SPT 03/2023: negative  - Use Zyrtec 10 mg daily as needed for runny nose, drainage, sneezing.

## 2023-04-16 NOTE — Progress Notes (Signed)
FOLLOW UP Date of Service/Encounter:  04/16/23   Subjective:  [name] (DOB: 09-Mar-1985) is a 38 y.o. female who [facility] for follow up for skin testing.   History obtained from: chart review and patient.  Anti histamines held.   Past Medical History: Past Medical History:  Diagnosis Date   ACL graft tear (HCC)    left knee   Allergy    Angio-edema    Anxiety    Complication of anesthesia    woke up during surgery   Depression    Family history of adverse reaction to anesthesia     mom is difficult to put to sleep   IUD (intrauterine device) in place    Knee pain, right    Migraine headache    Sleep paralysis    Ulcerative proctitis (HCC)     Objective:  There were no vitals taken for this visit. There is no height or weight on file to calculate BMI. Physical Exam: GEN: alert, well developed HEENT: clear conjunctiva, MMM LUNGS: unlabored respiration  Skin Testing:  Skin prick testing was placed, which includes aeroallergens/foods, histamine control, and saline control.  Verbal consent was obtained prior to placing test.  Patient tolerated procedure well.  Allergy testing results were read and interpreted by myself, documented by clinical staff. Adequate positive and negative control.  Positive results to:  Results discussed with patient/family.  Airborne Adult Perc - 04/16/23 0924     Time Antigen Placed 2956    Allergen Manufacturer Waynette Buttery    Location Back    Number of Test 55    Panel 1 Select    1. Control-Buffer 50% Glycerol Negative    2. Control-Histamine 3+    3. Bahia Negative    4. French Southern Territories Negative    5. Johnson Negative    6. Kentucky Blue Negative    7. Meadow Fescue Negative    8. Perennial Rye Negative    9. Timothy Negative    10. Ragweed Mix Negative    11. Cocklebur Negative    12. Plantain,  English Negative    13. Baccharis Negative    14. Dog Fennel Negative    15. Russian Thistle  Negative    16. Lamb's Quarters Negative    17. Sheep Sorrell Negative    18. Rough Pigweed Negative    19. Marsh Elder, Rough Negative    20. Mugwort, Common Negative    21. Box, Elder Negative    22. Cedar, red Negative    23. Sweet Gum Negative    24. Pecan Pollen Negative    25. Pine Mix Negative    26. Walnut, Black Pollen Negative    27. Red Mulberry Negative    28. Ash Mix Negative    29. Birch Mix Negative    30. Beech American Negative    31. Cottonwood, Guinea-Bissau Negative    32. Hickory, White Negative    33. Maple Mix Negative    34. Oak, Guinea-Bissau Mix Negative    35. Sycamore Eastern Negative    36. Alternaria Alternata Negative    37. Cladosporium Herbarum Negative    38. Aspergillus Mix Negative    39. Penicillium Mix Negative    40. Bipolaris Sorokiniana (Helminthosporium) Negative    41. Drechslera Spicifera (Curvularia) Negative    42. Mucor Plumbeus Negative    43. Fusarium Moniliforme Negative    44. Aureobasidium Pullulans (pullulara) Negative    45. Rhizopus  Oryzae Negative    46. Botrytis Cinera Negative    47. Epicoccum Nigrum Negative    48. Phoma Betae Negative    49. Dust Mite Mix Negative    50. Cat Hair 10,000 BAU/ml Negative    51.  Dog Epithelia Negative    52. Mixed Feathers Negative    53. Horse Epithelia Negative    54. Cockroach, German Negative    55. Tobacco Leaf Negative             13 Food Perc - 04/16/23 0924       Test Information   Time Antigen Placed 9562    Allergen Manufacturer Waynette Buttery    Location Back    Number of allergen test 13    Food Select      Food   1. Peanut Negative    2. Soybean Negative    3. Wheat Negative    4. Sesame Negative    5. Milk, Cow Negative    6. Casein Negative    7. Egg White, Chicken Negative    8. Shellfish Mix Negative    9. Fish Mix Negative    10. Cashew Negative    11. Walnut Food Negative    12. Almond Negative    13. Hazelnut Negative              Assessment:   1.  Other allergic rhinitis   2. Angioedema, subsequent encounter   3. Upper respiratory tract hypersensitivity reaction     Plan/Recommendations:  Globus Sensation/Throat Swelling/Palpitations/GI Symptoms  - For now avoid smoking, marijuana use, strong scents.  Unclear etiology for globus sensation. So far workup for mast cell disease has been negative, urine studies are pending.  - SPT 03/2023: negative for commonly allergenic foods.  - 03/2023: normal ANA, C1 esterase function, alpha gal, tryptase; 24 hour urine studies pending.   - GERD/reflux can also be a cause for globus sensation. She is following up with GI.    Other Allergic Rhinitis: - Due to turbinate hypertrophy, seasonal symptoms and unresponsive to over the counter meds, will perform skin testing to identify aeroallergen triggers.   - SPT 03/2023: negative  - Use Zyrtec 10 mg daily as needed for runny nose, drainage, sneezing.         Return if symptoms worsen or fail to improve.  Alesia Morin, MD [facility]

## 2023-04-24 LAB — N-METHYLHISTAMINE, 24 HR, U
Collection Duration: 24 h
Creatinine Concent. 24 Hr, U: 30 mg/dL
Creatinine, 24 Hour, U: 900 mg/(24.h) (ref 603–1783)
N-Methylhistamine, 24 Hr, U: 146 ug/g{creat} (ref 30–200)
Urine Volume: 3000 mL

## 2023-04-29 LAB — LEUKOTRIENE E4, 24 HR, U
Collection Duration: 24 h
Creatinine Concentration,24 HR: 30 mg/dL
Creatinine, 24 HR, U: 900 mg/(24.h) (ref 603–1783)
Leukotriene E4, U: <67 pg/mg{creat} (ref <=104)
Urine Volume: 3000 mL

## 2023-04-30 ENCOUNTER — Ambulatory Visit (HOSPITAL_BASED_OUTPATIENT_CLINIC_OR_DEPARTMENT_OTHER): Payer: 59 | Attending: Orthopedic Surgery | Admitting: Physical Therapy

## 2023-04-30 DIAGNOSIS — M6281 Muscle weakness (generalized): Secondary | ICD-10-CM

## 2023-04-30 DIAGNOSIS — Z9889 Other specified postprocedural states: Secondary | ICD-10-CM | POA: Insufficient documentation

## 2023-04-30 DIAGNOSIS — M25551 Pain in right hip: Secondary | ICD-10-CM

## 2023-04-30 DIAGNOSIS — M25651 Stiffness of right hip, not elsewhere classified: Secondary | ICD-10-CM

## 2023-04-30 DIAGNOSIS — X58XXXD Exposure to other specified factors, subsequent encounter: Secondary | ICD-10-CM | POA: Diagnosis not present

## 2023-04-30 DIAGNOSIS — S32691K Other specified fracture of right ischium, subsequent encounter for fracture with nonunion: Secondary | ICD-10-CM | POA: Diagnosis not present

## 2023-04-30 DIAGNOSIS — R262 Difficulty in walking, not elsewhere classified: Secondary | ICD-10-CM

## 2023-04-30 NOTE — Therapy (Incomplete)
 OUTPATIENT PHYSICAL THERAPY LOWER EXTREMITY EVALUATION   Patient Name: [name] MRN: [id number] DOB:1985-09-24, 38 y.o., female Today's Date: 05/01/2023  END OF SESSION:  PT End of Session - 04/30/23 2241     Visit Number 1    Number of Visits 24    Date for PT Re-Evaluation 07/23/23    Authorization Type [employer]    PT Start Time 1425    PT Stop Time 1525    PT Time Calculation (min) 60 min    Activity Tolerance Patient tolerated treatment well    Behavior During Therapy WFL for tasks assessed/performed             Past Medical History:  Diagnosis Date   ACL graft tear (HCC)    left knee   Allergy    Angio-edema    Anxiety    Complication of anesthesia    woke up during surgery   Depression    Family history of adverse reaction to anesthesia     mom is difficult to put to sleep   IUD (intrauterine device) in place    Knee pain, right    Migraine headache    Sleep paralysis    Ulcerative proctitis (HCC)    Past Surgical History:  Procedure Laterality Date   ANTERIOR CRUCIATE LIGAMENT REPAIR Left 08/10/2014   Procedure: ANTERIOR CRUCIATE LIGAMENT (ACL) REPAIR;  Surgeon: Eugenia Mcalpine, MD;  Location: [facility] [place];  Service: Orthopedics;  Laterality: Left;   HIP SURGERY Right 08/25/2019   PAO surgery [facility] Arn   HIP SURGERY Bilateral 01/05/2021   [facility], had hardware taken out   KNEE ARTHROSCOPY Left 08/10/2014   Procedure: ARTHROSCOPY KNEE DEBRIDEMENT ALLOGRAFT ACL REVISION RECONSTRUCTION;  Surgeon: Eugenia Mcalpine, MD;  Location: [facility];  Service: Orthopedics;  Laterality: Left;   KNEE ARTHROSCOPY W/ ACL RECONSTRUCTION Left 2006   SPINAL FUSION     widom tooth extraction     only had 1 wisdom tooth    Patient Active Problem List   Diagnosis Date Noted   Ulcerative proctitis with rectal bleeding (HCC) 06/16/2020   Bloating 06/16/2020   Chronic migraine without aura without status migrainosus, not intractable  08/19/2018   Migraine with aura and with status migrainosus, not intractable 08/19/2018   Pain in left shoulder 08/19/2018   Pain in left ankle and joints of left foot 05/09/2018   Pain in left hip 05/09/2018   Cervicalgia 05/09/2018   Chronic rhinitis 11/12/2017   Angioedema 11/12/2017   Allergic reaction 11/12/2017   Chronic sinusitis 11/12/2017   Cervical intraepithelial neoplasia grade 1 10/01/2017   GAD (generalized anxiety disorder) 09/17/2014   ACL graft tear (HCC) 08/10/2014   S/P ACL reconstruction 08/10/2014   Migraine headache 05/15/2010     REFERRING PROVIDER: Lily Kocher, MD  REFERRING DIAG: [phone] (ICD-10-CM) - Status post hip surgery / s/p arthroscopy of hip  S32.691K (ICD-10-CM) - Other specified fracture of right ischium, subsequent encounter for fracture with nonunion  THERAPY DIAG:  Pain in right hip  Muscle weakness (generalized)  Difficulty in walking, not elsewhere classified  Stiffness of right hip, not elsewhere classified  Rationale for Evaluation and Treatment: Rehabilitation  ONSET DATE: DOS  12/27/2022   SUBJECTIVE:   SUBJECTIVE STATEMENT: R hip arthroscopy with femoroplasty, labral repair, and pelvis osteotomy 08/24/2019 (PAO)  Pt had L hip labral repair and hip bone osteotomy, femoroplasty on 03/21/20.    Pt had hardware removal on 01/05/21 in bilat hips.  To help promote bone growth which did not help.    Pt underwent Revision R hip femoroplasty, arthroscopy with labral repair, open internal fixation post pelv ring Fx 12/27/2022  (bone graft)  04/05/23 recent history of revision arthroscopy and treatment for ischial nonunion, ---Her primary concern is persistent pain in various locations. She describes discomfort near the hamstring and in the buttock. Additionally, she reports anterior pain, which she believes may be more related to the hip flexor. Despite these symptoms, imaging shows that the hardware is intact and there is  progressive healing of the ischial nonunion.   Pt had x rays:  Hip X-ray: Hardware intact with progressive healing of ischial nonunion  Ischial Nonunion Post Revision Arthroscopy Slowly improving with persistent pain in the hamstring and buttock region. Anterior pain possibly related to hip flexor. Imaging shows intact hardware and progressive healing. -Continue to increase activities as tolerated. -Continue with physical therapy.  Anticipated Start of Aquatic Physical Therapy  MD sent surgical protocol for arthroscopic hip labral repair with femoral osteoplasty.   Pt states she hurts with everything.  Pt is limited with ambulation distance, standing duration, and stairs due to pain.  Pt unable to bend over.  Pt unable to walk her dog and walk her neighborhood.  Pt has pain with driving.  Pt is very limited with household chores due to pain.  Sitting is affected due to her hip pain.   Dry needling helped me last time.  Pt wants to perform aquatic therapy.  Pt hasn't performed therapy since the surgery.   PERTINENT HISTORY: L ACL repair x 2 in 06 and 2016 GAD Migraines Ulcerative colitis and celiac Mast cell activation syndrome Cervical fusion in 2020 and 2021   PAIN:  NPRS:  7/10 current, 10/10 worst, 4/10 best Location:  R anterior and posteror hip, HS  PRECAUTIONS: {Therapy precautions:24002}   WEIGHT BEARING RESTRICTIONS:  none indicated on orders  FALLS:  Has patient fallen in last 6 months? No  LIVING ENVIRONMENT: Lives with: lives with their spouse Lives in:  2 story home but moving into a 1 story Stairs: yes   PLOF: Independent  PATIENT GOALS: to be pain-free, improve ROM, improve quality of life    OBJECTIVE:  Note: Objective measures were completed at Evaluation unless otherwise noted.  DIAGNOSTIC FINDINGS: Pelvis x ray on 04/05/23:   Findings/Impression:  Hardware: Right acetabular plate and screw fixation without complication. Alignment: No  dislocation. Bones: No acute fracture. Healing osteotomy of the right superior pubic ramus root. Joints: Mild degenerative changes of the left hip. Soft Tissues: Unremarkable.    PATIENT SURVEYS:  LEFS 41/80  COGNITION: Overall cognitive status: Within functional limits for tasks assessed          Pt states all of her incisions are healed and closed with no scabbing.    PALPATION: TTP:  anterior > lateral hip.  Tender in R glute and along entire HS to post knee   LOWER EXTREMITY ROM:  Active ROM Right eval Left eval  Hip flexion 105 118  Hip extension    Hip abduction 22 30  Hip adduction    Hip internal rotation 42 24  Hip external rotation 24 40  Knee flexion    Knee extension    Ankle dorsiflexion    Ankle plantarflexion    Ankle inversion    Ankle eversion     (Blank rows = not tested)  LOWER EXTREMITY MMT:  Strength not tested   GAIT: Assistive device  utilized: None Level of assistance: Complete Independence Comments: Pt has an antalgic limp.  She favors R LE with gait and appears to have a R hip drop.  7/10 pain with ambulation.                                                                                                                                TREATMENT DATE:    Pt performed Quad sets with 5 sec hold x 10 reps. PT educated in performance and palpation of TrA contraction.  Pt performed supine TrA contractions with and without 5 sec hold. Pt unable to perform S/L clams due to weakness.  Pt performed hooklying bent knee fall outs with TrA contraction 2x10.  PT educated pt to not perform into a painful range or a tight range.  PT instructed pt she should not have pain with exercises. PT received a HEP handout and was educated in correct form and appropriate frequency.     PATIENT EDUCATION:  Education details: HEP, POC, relevant anatomy, objective findings, rationale of interventions, and dx.  PT answered pt's questions.  PT educated pt in the  aquatic process and the rationale of aquatic therapy  Person educated: Patient Education method: Explanation, Demonstration, Tactile cues, Verbal cues, and Handouts Education comprehension: verbalized understanding, returned demonstration, verbal cues required, tactile cues required, and needs further education  HOME EXERCISE PROGRAM: Access Code: URL: [email or web address] Date: 04/30/2023 Prepared by: Aaron Edelman  Exercises - Supine Transversus Abdominis Bracing - Hands on Stomach  - 2 x daily - 7 x weekly - 2 sets - 10 reps - 5 seconds hold - Bent Knee Fallouts  - 1-2 x daily - 7 x weekly - 1-2 sets - 10 reps  ASSESSMENT:  CLINICAL IMPRESSION: Patient is a 38 y.o. female 17 weeks and 5 days s/p   OBJECTIVE IMPAIRMENTS: {opptimpairments:25111}.   ACTIVITY LIMITATIONS: {activitylimitations:27494}  PARTICIPATION LIMITATIONS: {participationrestrictions:25113}  PERSONAL FACTORS: {Personal factors:25162} are also affecting patient's functional outcome.   REHAB POTENTIAL: Good  CLINICAL DECISION MAKING: Evolving/moderate complexity  EVALUATION COMPLEXITY: Moderate   GOALS:  SHORT TERM GOALS: Target date: *** *** Baseline: Goal status: INITIAL  2.  *** Baseline:  Goal status: INITIAL  3.  *** Baseline:  Goal status: INITIAL  4.  *** Baseline:  Goal status: INITIAL  5.  *** Baseline:  Goal status: INITIAL  6.  *** Baseline:  Goal status: INITIAL  LONG TERM GOALS: Target date: 07/23/2023   *** Baseline:  Goal status: INITIAL  2.  *** Baseline:  Goal status: INITIAL  3.  *** Baseline:  Goal status: INITIAL  4.  *** Baseline:  Goal status: INITIAL  5.  *** Baseline:  Goal status: INITIAL  6.  *** Baseline:  Goal status: INITIAL   PLAN:  PT FREQUENCY: 2x/week  PT DURATION: 12 weeks  PLANNED INTERVENTIONS: {rehab planned interventions:25118::"97110-Therapeutic exercises","97530- Therapeutic [phone]-  Neuromuscular re-education","97535- Self IONG","29528- Manual therapy"}  PLAN FOR  NEXT SESSION: Start with aquatic therapy.  Plan for aquatic therapy x 3 weeks and then alternating land and aquatic therapy.     Aaron Edelman, PT 05/01/2023, 10:27 PM

## 2023-05-01 ENCOUNTER — Other Ambulatory Visit: Payer: Self-pay

## 2023-05-01 ENCOUNTER — Encounter (HOSPITAL_BASED_OUTPATIENT_CLINIC_OR_DEPARTMENT_OTHER): Payer: Self-pay | Admitting: Physical Therapy

## 2023-05-02 ENCOUNTER — Ambulatory Visit: Payer: 59 | Admitting: Gastroenterology

## 2023-05-02 ENCOUNTER — Encounter: Payer: Self-pay | Admitting: Gastroenterology

## 2023-05-02 ENCOUNTER — Other Ambulatory Visit

## 2023-05-02 VITALS — BP 104/76 | HR 80 | Ht 63.0 in | Wt 136.5 lb

## 2023-05-02 DIAGNOSIS — K51211 Ulcerative (chronic) proctitis with rectal bleeding: Secondary | ICD-10-CM

## 2023-05-02 DIAGNOSIS — R140 Abdominal distension (gaseous): Secondary | ICD-10-CM

## 2023-05-02 DIAGNOSIS — K51919 Ulcerative colitis, unspecified with unspecified complications: Secondary | ICD-10-CM

## 2023-05-02 DIAGNOSIS — F419 Anxiety disorder, unspecified: Secondary | ICD-10-CM

## 2023-05-02 DIAGNOSIS — K589 Irritable bowel syndrome without diarrhea: Secondary | ICD-10-CM

## 2023-05-02 LAB — SEDIMENTATION RATE: Sed Rate: 9 mm/h (ref 0–20)

## 2023-05-02 LAB — IBC + FERRITIN
Ferritin: 21.3 ng/mL (ref 10.0–291.0)
Iron: 119 ug/dL (ref 42–145)
Saturation Ratios: 33.9 % (ref 20.0–50.0)
TIBC: 351.4 ug/dL (ref 250.0–450.0)
Transferrin: 251 mg/dL (ref 212.0–360.0)

## 2023-05-02 LAB — VITAMIN D 25 HYDROXY (VIT D DEFICIENCY, FRACTURES): VITD: 35.72 ng/mL (ref 30.00–100.00)

## 2023-05-02 LAB — C-REACTIVE PROTEIN: CRP: <1 mg/dL (ref 0.5–20.0)

## 2023-05-02 MED ORDER — DICYCLOMINE HCL 10 MG PO CAPS: 10.0000 mg | ORAL_CAPSULE | Freq: Four times a day (QID) | ORAL | 5 refills | Status: AC

## 2023-05-02 NOTE — Patient Instructions (Addendum)
 Your provider has requested that you go to the basement level for lab work before leaving today. Press "B" on the elevator. The lab is located at the first door on the left as you exit the elevator.  You have been referred to Behavioral health for anxiety. They will contact you with an appointment.   We have sent the following medications to your pharmacy for you to pick up at your convenience: Bentyl.    Low FODMAP Diet: (Fermentable Oligosaccharides, Disaccharides, Monosaccharides, and Polyols) These are short chain carbohydrates and sugar alcohols that are poorly absorbed by the body, resulting in multiple abdominal symptoms, including changes in bowel habits, abdominal pain/discomfort, bloating, abdominal distension, gas, etc.

## 2023-05-02 NOTE — Progress Notes (Signed)
 Chief Complaint:    Ulcerative Colitis  GI History: [name] is a 38 year old female with a history of Ulcerative Colitis with overlapping IBS, previously followed by Dr. Orvan Falconer, along with multiple previous hip surgeries.     IBD evaluation to date: -Diagnosed with distal UC on colonoscopy in 09/2018.  Index sxs of hematochezia, frequency, urgency. Fecal calprotectin 340 in 10/2018 at diagnosis.  Good response to medications and fecal calprotectin normalized. - TPMT: None - TB testing: None - HBV status: None - Pertinent Imaging: CT enterography 01/2023: Normal-appearing GI tract.  Large stool burden suspicious for constipation  - Last colonoscopy: 09/2020 - History of EIMs: None (hip dysfunction?)   Medications to date: Lialda, Canasa, Rowasa Current IBD medications: Lialda   Health Maintenance:  - DEXA: N/A - Vaccinations:      - Annual Flu Vaccine: Yearly through PCM office      - Pneumococcal Vaccine: N/A      - Covid Vaccine: declines   - Micronutrient eval:       - Annual Vit D, B6, iron panel: Ordered today - Annual Pap (if immunosuppressed): Via PCM annually - Surveillance labs for immunomodulators: N/A - Annual depression screening: Discussed at length today as below.  Referral placed to Psychiatry   Endoscopic Hx: - Colonoscopy 10/22/2018: Diffuse mild inflammation characterized by erythema, friability and granularity was found in the rectum extending to 16cm from the anal verge.  The remainder of the colon appeared normal. Rectal biopsies showed moderately active chronic colitis consistent with inflammatory bowel disease.  Other biopsies taken throughout the colon and terminal ileum were normal. - Colonoscopy 10/11/2020: chronic proctitis, otherwise normal colon biopsies throughout the colon.  Was continued on Lialda, [place] - EGD 02/13/2023: Moderate gastritis (edema, erosions, erythema, shallow ulceration), normal duodenum (no celiac disease).  Increased Protonix  to 40 mg twice daily x 6 weeks  HPI:     Patient is a 38 y.o. female presenting to the Gastroenterology Clinic for follow-up.  Was last seen in the office (virtual) on 01/08/2023.  She was scheduled for EGD to evaluate for overlapping celiac disease that was diagnosed by a functional clinic earlier in the year (despite having previous negative serologies).  EGD was unremarkable and no endoscopic or histologic evidence of celiac disease.  CT enterography 01/2023 was normal (constipation)   Today, she states main issue is bloating and abdoiminal distension over the last few weeks.  Will have associated upper abdominal discomfort.  Still taking Lialda 4.8 gm/day as prescribed.  No recent hematochezia.   She describes having sensation of throat closing and what essentially sounds like a panic attack after smoking marijuana recently.  Has been out of her Klonopin.  Discussed with her PCM and had referrals placed to the Allergy Clinic and ENT Clinics.  Was seen in the Allergy Clinic.  Evaluation for mast cell disease was negative/normal.  Skin prick testing negative.  Was seen in the ENT clinic as well.  Laryngoscopy with pharyngeal mucus drainage potentially suggestive of LPR, but otherwise largely unremarkable exam.  Still following with [facility] clinic.  Reviewed over 40 pages of various labs that patient brought with her today.  Most notable is normal B6, low vitamin C, negative/normal GI PCR panel, fecal calprotectin 18, FOBT negative - Pancreatic elastase 133  Review of systems:     No chest pain, no SOB, no fevers, no urinary sx   Past Medical History:  Diagnosis Date   ACL graft tear (HCC)  left knee   Allergy    Angio-edema    Anxiety    Complication of anesthesia    woke up during surgery   Depression    Family history of adverse reaction to anesthesia     mom is difficult to put to sleep   IUD (intrauterine device) in place    Knee pain, right     Migraine headache    Sleep paralysis    Ulcerative proctitis (HCC)     Patient's surgical history, family medical history, social history, medications and allergies were all reviewed in Epic    Current Outpatient Medications  Medication Sig Dispense Refill   clonazePAM (KLONOPIN) 0.5 MG tablet Take 1 tablet (0.5 mg total) by mouth daily as needed for anxiety. TAKE 1 TABLET(0.5 MG) BY MOUTH DAILY AS NEEDED FOR ANXIETY 30 tablet 0   cyclobenzaprine (FLEXERIL) 10 MG tablet Take 1 tablet (10 mg total) by mouth 2 (two) times daily as needed for muscle spasms. TAKE 1 TABLET(10 MG) BY MOUTH AT BEDTIME 30 tablet 3   dicyclomine (BENTYL) 10 MG capsule Take 2 capsules (20 mg total) by mouth in the morning, at noon, in the evening, and at bedtime. 180 capsule 3   DULoxetine (CYMBALTA) 30 MG capsule Take 1 capsule (30 mg total) by mouth daily. 90 capsule 0   mesalamine (LIALDA) 1.2 g EC tablet TAKE 4 TABLETS(4.8 GRAMS) BY MOUTH DAILY WITH BREAKFAST 360 tablet 3   mesalamine (ROWASA) 4 g enema Place 60 mLs (4 g total) rectally 2 (two) times daily. 3600 mL 3   pantoprazole (PROTONIX) 40 MG tablet Take 1 tablet (40 mg total) by mouth 2 (two) times daily for 42 days, THEN 1 tablet (40 mg total) daily. Use Protonix 40 mg PO BID for 6 weeks then reduce to 40mg  daily.. 180 tablet 1   SUMAtriptan (IMITREX) 100 MG tablet TAKE 1 TABLET IMMEDIATELY. MAY REPEAT IN 2 HRS AS NEEDED FOR MIGRAINE 25 tablet 5   sulfaSALAzine (AZULFIDINE) 500 MG tablet TAKE 1 TABLET BY MOUTH THREE TIMES DAILY BEFORE MEALS (Patient not taking: Reported on 05/02/2023)     Current Facility-Administered Medications  Medication Dose Route Frequency Provider Last Rate Last Admin   0.9 %  sodium chloride infusion  500 mL Intravenous Once Tressia Danas, MD       0.9 %  sodium chloride infusion  500 mL Intravenous Once Kehinde Totzke V, DO        Physical Exam:     BP 104/76 (BP Location: Left Arm, Patient Position: Sitting, Cuff Size:  Normal)   Pulse 80   Ht 5\' 3"  (1.6 m)   Wt 136 lb 8 oz (61.9 kg)   LMP 04/15/2023   BMI 24.18 kg/m   GENERAL:  Pleasant female in NAD PSYCH: : Cooperative, normal affect Musculoskeletal:  Normal muscle tone, normal strength NEURO: Alert and oriented x 3, no focal neurologic deficits   IMPRESSION and PLAN:    1) Ulcerative Colitis History of UC proctitis. - Continue Lialda 4.8 g daily for the time being.  Can potentially reduce to 2.4 g daily if UC symptoms well-controlled - Check fecal calprotectin, ESR, CRP as surrogate marker for inflammation - Annual micronutrient check: Vitamin D, B6, iron panel - Last colonoscopy was 09/2020 and normal-appearing colon.  Previous Endoscopies recommended repeat in 2028  2) IBS 3) Abdominal bloating 4) Abdominal distention Again discussed diagnosis of overlapping IBS.  Discussed pathophysiology of IBS along with the strong association between IBS and  underlying anxiety.  She does have a history of anxiety with panic attacks. -Restart Bentyl.  Placed Rx today for 10 mg as needed every 6 hours for abdominal pain, discomfort - Start low FODMAP diet.  Provided with handout and detailed instructions today - Currently prescribed Cymbalta.  Can consider addition of TCA, but would do so in conjunction with BHT - As below, placed referral to Psychiatry.  Can hopefully see improvement in IBS symptoms with better control of her underlying anxiety - Recent EGD with biopsy negative for celiac disease.  CTE otherwise normal  5) Anxiety - Referral to Psychiatrist      RTC in 6 months or sooner as needed     I spent 45 minutes of time, including in depth chart review, independent review of results as outlined above, communicating results with the patient directly, face-to-face time with the patient, coordinating care, ordering studies and medications as appropriate, and documentation.       Shellia Cleverly ,DO, FACG 05/02/2023, 9:57 AM

## 2023-05-03 ENCOUNTER — Encounter: Payer: Self-pay | Admitting: Gastroenterology

## 2023-05-06 ENCOUNTER — Encounter (HOSPITAL_BASED_OUTPATIENT_CLINIC_OR_DEPARTMENT_OTHER): Payer: Self-pay | Admitting: Physical Therapy

## 2023-05-06 ENCOUNTER — Ambulatory Visit (HOSPITAL_BASED_OUTPATIENT_CLINIC_OR_DEPARTMENT_OTHER): Admitting: Physical Therapy

## 2023-05-06 DIAGNOSIS — R262 Difficulty in walking, not elsewhere classified: Secondary | ICD-10-CM

## 2023-05-06 DIAGNOSIS — M6281 Muscle weakness (generalized): Secondary | ICD-10-CM

## 2023-05-06 DIAGNOSIS — M25551 Pain in right hip: Secondary | ICD-10-CM

## 2023-05-06 DIAGNOSIS — M25651 Stiffness of right hip, not elsewhere classified: Secondary | ICD-10-CM

## 2023-05-06 DIAGNOSIS — Z9889 Other specified postprocedural states: Secondary | ICD-10-CM | POA: Diagnosis not present

## 2023-05-06 NOTE — Therapy (Signed)
 OUTPATIENT PHYSICAL THERAPY LOWER EXTREMITY EVALUATION   Patient Name: [name] MRN: [id number] DOB:[birth year], 38 y.o., female Today's Date: 05/06/2023  END OF SESSION:  PT End of Session - 05/06/23 1813     Visit Number 2    Number of Visits 24    Date for PT Re-Evaluation 07/23/23    Authorization Type [employer]    PT Start Time 0508    PT Stop Time 0602    PT Time Calculation (min) 54 min    Activity Tolerance Patient tolerated treatment well    Behavior During Therapy WFL for tasks assessed/performed              Past Medical History:  Diagnosis Date   ACL graft tear (HCC)    left knee   Allergy    Angio-edema    Anxiety    Complication of anesthesia    woke up during surgery   Depression    Family history of adverse reaction to anesthesia     mom is difficult to put to sleep   IUD (intrauterine device) in place    Knee pain, right    Migraine headache    Sleep paralysis    Ulcerative proctitis (HCC)    Past Surgical History:  Procedure Laterality Date   ANTERIOR CRUCIATE LIGAMENT REPAIR Left 08/10/2014   Procedure: ANTERIOR CRUCIATE LIGAMENT (ACL) REPAIR;  Surgeon: Eugenia Mcalpine, MD;  Location: [facility];  Service: Orthopedics;  Laterality: Left;   HIP SURGERY Right 08/25/2019   PAO surgery [facility]   HIP SURGERY Bilateral 01/05/2021   [facility], had hardware taken out   KNEE ARTHROSCOPY Left 08/10/2014   Procedure: ARTHROSCOPY KNEE DEBRIDEMENT ALLOGRAFT ACL REVISION RECONSTRUCTION;  Surgeon: Eugenia Mcalpine, MD;  Location: [facility];  Service: Orthopedics;  Laterality: Left;   KNEE ARTHROSCOPY W/ ACL RECONSTRUCTION Left 2006   SPINAL FUSION     widom tooth extraction     only had 1 wisdom tooth    Patient Active Problem List   Diagnosis Date Noted   Ulcerative proctitis with rectal bleeding (HCC) 06/16/2020   Bloating 06/16/2020   Chronic migraine without aura without status migrainosus, not intractable  08/19/2018   Migraine with aura and with status migrainosus, not intractable 08/19/2018   Pain in left shoulder 08/19/2018   Pain in left ankle and joints of left foot 05/09/2018   Pain in left hip 05/09/2018   Cervicalgia 05/09/2018   Chronic rhinitis 11/12/2017   Angioedema 11/12/2017   Allergic reaction 11/12/2017   Chronic sinusitis 11/12/2017   Cervical intraepithelial neoplasia grade 1 10/01/2017   GAD (generalized anxiety disorder) 09/17/2014   ACL graft tear (HCC) 08/10/2014   S/P ACL reconstruction 08/10/2014   Migraine headache 05/15/2010     REFERRING PROVIDER: Lily Kocher, MD  REFERRING DIAG: [phone] (ICD-10-CM) - Status post hip surgery / s/p arthroscopy of hip  S32.691K (ICD-10-CM) - Other specified fracture of right ischium, subsequent encounter for fracture with nonunion  THERAPY DIAG:  Pain in right hip  Muscle weakness (generalized)  Difficulty in walking, not elsewhere classified  Stiffness of right hip, not elsewhere classified  Rationale for Evaluation and Treatment: Rehabilitation  ONSET DATE: DOS  12/27/2022   SUBJECTIVE:   SUBJECTIVE STATEMENT: "I have been looking forward to this."  Initial subjective Pt has a hx of hip surgeries.  She underwent R hip arthroscopy with femoroplasty, labral repair, and pelvis osteotomy on 08/24/2019 (PAO).  Pt then had L  hip labral repair and hip bone osteotomy, femoroplasty on 03/21/20.  Pt had hardware removal on 01/05/21 in bilat hips to help promote bone growth which did not help.   Pt underwent Revision R hip femoroplasty, arthroscopy with labral repair, open internal fixation post pelv ring Fx on 12/27/2022.  Pt had an ischial nonunion.  Pt had x rays which showed that the hardware is intact and there is progressive healing of the ischial nonunion.  MD note indicated to continue to increase activities as tolerated and continue with physical therapy with anticipated start of aquatic physical therapy.  MD  sent surgical protocol for arthroscopic hip labral repair with femoral osteoplasty.  Pt states she hurts with everything.  Pt is limited with ambulation distance, standing duration, and stairs due to pain.  Pt unable to bend over.  Pt unable to walk her dog and walk her neighborhood.  Pt has pain with driving.  Pt is very limited with household chores due to pain.  Sitting is affected due to her hip pain.   Pt hasn't performed therapy since the surgery.  Pt states dry needling helped her last time she was in therapy.  Pt wants to perform aquatic therapy.    PERTINENT HISTORY: Hx of hip surgeries: -Revision R hip femoroplasty, arthroscopy with labral repair, open internal fixation post pelv ring Fx on 12/27/2022  -R hip arthroscopy with femoroplasty, labral repair, and pelvis osteotomy on 08/24/2019 (PAO)  -L hip labral repair and hip bone osteotomy, femoroplasty on 03/21/20 -hardware removal on 01/05/21 in bilat hips     L ACL repair x 2 in 06 and 2016 GAD Migraines Ulcerative colitis and celiac Mast cell activation syndrome Cervical fusion in 2020 and 2021   PAIN:  NPRS:  7/10 current, 10/10 worst, 4/10 best Location:  R anterior and posteror hip, HS  PRECAUTIONS: Other: per surgical protocol, R hip labral repair, L ACL repair x 2, cervical fusion x 2   WEIGHT BEARING RESTRICTIONS:  none indicated on orders  FALLS:  Has patient fallen in last 6 months? No  LIVING ENVIRONMENT: Lives with: lives with their spouse Lives in:  2 story home but moving into a 1 story Stairs: yes   PLOF: Independent  PATIENT GOALS: to be pain-free, improve ROM, improve quality of life    OBJECTIVE:  Note: Objective measures were completed at Evaluation unless otherwise noted.  DIAGNOSTIC FINDINGS: Pelvis x ray on 04/05/23:   Findings/Impression:  Hardware: Right acetabular plate and screw fixation without complication. Alignment: No dislocation. Bones: No acute fracture. Healing osteotomy  of the right superior pubic ramus root. Joints: Mild degenerative changes of the left hip. Soft Tissues: Unremarkable.    PATIENT SURVEYS:  LEFS 41/80  COGNITION: Overall cognitive status: Within functional limits for tasks assessed          Pt states all of her incisions are healed and closed with no scabbing.    PALPATION: TTP:  anterior > lateral hip.  Tender in R glute and along entire HS to post knee   LOWER EXTREMITY ROM:  Active ROM Right eval Left eval  Hip flexion 105 118  Hip extension    Hip abduction 22 30  Hip adduction    Hip internal rotation 42 24  Hip external rotation 24 40  Knee flexion    Knee extension    Ankle dorsiflexion    Ankle plantarflexion    Ankle inversion    Ankle eversion     (Blank rows =  not tested)  LOWER EXTREMITY MMT:  Strength not tested   GAIT: Assistive device utilized: None Level of assistance: Complete Independence Comments: Pt has an antalgic limp.  She favors R LE with gait and appears to have a R hip drop.  7/10 pain with ambulation.                                                                                                                                TREATMENT DATE:    [facility] Adult PT Treatment:                                                DATE: 05/06/23 Pt seen for aquatic therapy today.  Treatment took place in water 3.5-4.75 ft in depth at the [facility] pool. Temp of water was 91.  Pt entered/exited the pool via stairs and step to pattern with hand rail.  *Intro to setting *walking forward, back and side stepping in 3.6 ft with ue support of *3 way hamstring stretch *quad and hip flex stretch using noodle *squats 3.8 ft->3.19ft->bottom step *hip hiking R/L 2 x 10 (difficult) *standing UE on wall: SL clam *STS from 3rd step 2 x 5 (good challenge)   Pt requires the buoyancy and hydrostatic pressure of water for support, and to offload joints by unweighting joint load by at least 50 % in  navel deep water and by at least 75-80% in chest to neck deep water.  Viscosity of the water is needed for resistance of strengthening. Water current perturbations provides challenge to standing balance requiring increased core activation.      Pt performed Quad sets with 5 sec hold x 10 reps. PT educated in performance and palpation of TrA contraction.  Pt performed supine TrA contractions with and without 5 sec hold. Pt unable to perform S/L clams due to weakness.  Pt performed hooklying bent knee fall outs with TrA contraction 2x10.  PT educated pt to not perform into a painful range or a tight range.  PT instructed pt she should not have pain with exercises. PT received a HEP handout and was educated in correct form and appropriate frequency.     PATIENT EDUCATION:  Education details: HEP, POC, relevant anatomy, objective findings, rationale of interventions, and dx.  PT answered pt's questions.  PT educated pt in the aquatic process and the rationale of aquatic therapy  Person educated: Patient Education method: Explanation, Demonstration, Tactile cues, Verbal cues, and Handouts Education comprehension: verbalized understanding, returned demonstration, verbal cues required, tactile cues required, and needs further education  HOME EXERCISE PROGRAM: Access Code: URL: [email or web address] Date: 04/30/2023 Prepared by: Aaron Edelman  Exercises - Supine Transversus Abdominis Bracing - Hands on Stomach  - 2 x daily - 7 x weekly - 2 sets - 10 reps -  5 seconds hold - Bent Knee Fallouts  - 1-2 x daily - 7 x weekly - 1-2 sets - 10 reps  ASSESSMENT:  CLINICAL IMPRESSION: Pt demonstrates safety and independence in aquatic setting with therapist instructing from deck. She is confident in setting, moving throughout all depths easily. Focus today is on stretching of hip muscles and initiating activation of glutes.  She tolerates very well with some increase in soreness. Pt  very motivated and ready to move forward with therapy. May need some encouragement not to overdo.  Goals are ongoing.      Initial Impression Patient is a 38 y.o. female 17 weeks and 5 days s/p Revision R hip femoroplasty, arthroscopy with labral repair, open internal fixation post pelv ring Fx on 12/27/2022.  Pt continues to have constant significant pain.  She is limited with her functional mobility skills and activities.  Pt is limited with ambulation distance, standing duration, and stairs due to pain.  Pt unable to walk her dog and walk her neighborhood.  She has pain with driving and is very limited with household chores due to pain.  Pt hasn't received any therapy since this past surgery.  She should benefit from skilled PT services per protocol including aquatic therapy to address impairments and improve overall function.     OBJECTIVE IMPAIRMENTS: Abnormal gait, decreased activity tolerance, decreased endurance, decreased mobility, difficulty walking, decreased ROM, decreased strength, hypomobility, and pain.   ACTIVITY LIMITATIONS: lifting, bending, sitting, standing, squatting, stairs, transfers, and locomotion level  PARTICIPATION LIMITATIONS: cleaning, laundry, driving, shopping, and community activity  PERSONAL FACTORS: Time since onset of injury/illness/exacerbation and 3+ comorbidities: R hip labral repair, L ACL repair x 2, cervical fusion x 2  are also affecting patient's functional outcome.   REHAB POTENTIAL: Good  CLINICAL DECISION MAKING: Evolving/moderate complexity  EVALUATION COMPLEXITY: Moderate   GOALS:  SHORT TERM GOALS: Target date:  05/28/2023   Pt will be independent with HEP for improved pain, strength, tolerance to activity, and function.  Baseline: Goal status: INITIAL  2.  Pt will tolerate aquatic therapy without adverse effects in order to perform exercises in a reduced stress environment and improve functional mobility, tolerance to activity, strength,  and pain.  Baseline:  Goal status: INITIAL Target date:  05/21/2023   3.  Pt's worst pain will be no > than 8/10 for improved tolerance to activity and mobility.  Baseline:  Goal status: INITIAL  4.  Pt will report at least a 25% improvement in her daily mobility.   Baseline:  Goal status: INITIAL  5.  Pt will demonstrate improved quality of gait including improved Wb'ing thru R LE, reduced hip drop, and reduced limp.  Baseline:  Goal status: INITIAL  6.  Pt will perform a 6 inch step up with good form and stability without significant pain Baseline:  Goal status: INITIAL Target date:  06/04/2023  7.  Pt will ambulate with a normalized heel to toe gait pattern without limping.   Goal status:  INITIAL  Target date:  06/18/2023    LONG TERM GOALS: Target date: 07/23/2023   Pt will ambulate extended community distance without significant pain and difficulty.  Baseline:  Goal status: INITIAL  2.  Pt will ascend and descend stairs with a reciprocal gait with a rail.  Baseline:  Goal status: INITIAL  3.  Pt will be able to walk her neighborhood without significant pain. Baseline:  Goal status: INITIAL  4.  Pt will be able to perform her  ADLs and IADLs including household chores without significant pain and difficulty.  Baseline:  Goal status: INITIAL  5.  Pt will squat with symmetrical Wb'ing in bilat LE's and good form without increased pain for improved functional strength.  Baseline:  Goal status: INITIAL  6.  Pt will demo 4 to 4+/5 strength in R hip abd, 4/5 in R hip flex, and 5/5 in R knee ext for improved performance of and tolerance with functional mobility.  Baseline:  Goal status: INITIAL   PLAN:  PT FREQUENCY: 2x/week  PT DURATION: 12 weeks  PLANNED INTERVENTIONS: 97164- PT Re-evaluation, 97110-Therapeutic exercises, 97530- Therapeutic activity, 97112- Neuromuscular re-education, 97535- Self Care, 16109- Manual therapy, [phone]- Gait training, [phone]-  Aquatic Therapy, [phone]- Electrical stimulation (unattended), Patient/Family education, Balance training, Stair training, Taping, Dry Needling, Joint mobilization, Spinal mobilization, Scar mobilization, Cryotherapy, and Moist heat  PLAN FOR NEXT SESSION: Start with aquatic therapy.  Plan for aquatic therapy x 3 weeks and then alternating land and aquatic therapy.  Cont per Dr. Theadore Nan protocol.  MD sent surgical protocol for arthroscopic hip labral repair with femoral osteoplasty.   [address] [place]) Makyah Lavigne MPT 05/06/23 6:14 PM [facility] [address] [place], [place], [address] Phone: [phone]   Fax:  [phone]

## 2023-05-08 LAB — VITAMIN B6: Vitamin B6: 12 ng/mL (ref 2.1–21.7)

## 2023-05-09 ENCOUNTER — Encounter (HOSPITAL_BASED_OUTPATIENT_CLINIC_OR_DEPARTMENT_OTHER): Payer: Self-pay | Admitting: Physical Therapy

## 2023-05-09 ENCOUNTER — Ambulatory Visit (HOSPITAL_BASED_OUTPATIENT_CLINIC_OR_DEPARTMENT_OTHER): Admitting: Physical Therapy

## 2023-05-09 DIAGNOSIS — Z9889 Other specified postprocedural states: Secondary | ICD-10-CM | POA: Diagnosis not present

## 2023-05-09 DIAGNOSIS — R262 Difficulty in walking, not elsewhere classified: Secondary | ICD-10-CM

## 2023-05-09 DIAGNOSIS — M6281 Muscle weakness (generalized): Secondary | ICD-10-CM

## 2023-05-09 DIAGNOSIS — M25551 Pain in right hip: Secondary | ICD-10-CM

## 2023-05-09 DIAGNOSIS — M25651 Stiffness of right hip, not elsewhere classified: Secondary | ICD-10-CM

## 2023-05-09 NOTE — Therapy (Signed)
 OUTPATIENT PHYSICAL THERAPY LOWER EXTREMITY TREATMENT   Patient Name: [name] MRN: [id number] DOB:1985-05-31, 38 y.o., female Today's Date: 05/09/2023  END OF SESSION:  PT End of Session - 05/09/23 1029     Visit Number 3    Number of Visits 24    Date for PT Re-Evaluation 07/23/23    Authorization Type [employer]    PT Start Time 1019    PT Stop Time 1058    PT Time Calculation (min) 39 min    Behavior During Therapy WFL for tasks assessed/performed              Past Medical History:  Diagnosis Date   ACL graft tear (HCC)    left knee   Allergy    Angio-edema    Anxiety    Complication of anesthesia    woke up during surgery   Depression    Family history of adverse reaction to anesthesia     mom is difficult to put to sleep   IUD (intrauterine device) in place    Knee pain, right    Migraine headache    Sleep paralysis    Ulcerative proctitis (HCC)    Past Surgical History:  Procedure Laterality Date   ANTERIOR CRUCIATE LIGAMENT REPAIR Left 08/10/2014   Procedure: ANTERIOR CRUCIATE LIGAMENT (ACL) REPAIR;  Surgeon: Eugenia Mcalpine, MD;  Location: [facility] [place];  Service: Orthopedics;  Laterality: Left;   HIP SURGERY Right 08/25/2019   PAO surgery [facility]   HIP SURGERY Bilateral 01/05/2021   [facility], had hardware taken out   KNEE ARTHROSCOPY Left 08/10/2014   Procedure: ARTHROSCOPY KNEE DEBRIDEMENT ALLOGRAFT ACL REVISION RECONSTRUCTION;  Surgeon: Eugenia Mcalpine, MD;  Location: [facility];  Service: Orthopedics;  Laterality: Left;   KNEE ARTHROSCOPY W/ ACL RECONSTRUCTION Left 2006   SPINAL FUSION     widom tooth extraction     only had 1 wisdom tooth    Patient Active Problem List   Diagnosis Date Noted   Ulcerative proctitis with rectal bleeding (HCC) 06/16/2020   Bloating 06/16/2020   Chronic migraine without aura without status migrainosus, not intractable 08/19/2018   Migraine with aura and with status migrainosus,  not intractable 08/19/2018   Pain in left shoulder 08/19/2018   Pain in left ankle and joints of left foot 05/09/2018   Pain in left hip 05/09/2018   Cervicalgia 05/09/2018   Chronic rhinitis 11/12/2017   Angioedema 11/12/2017   Allergic reaction 11/12/2017   Chronic sinusitis 11/12/2017   Cervical intraepithelial neoplasia grade 1 10/01/2017   GAD (generalized anxiety disorder) 09/17/2014   ACL graft tear (HCC) 08/10/2014   S/P ACL reconstruction 08/10/2014   Migraine headache 05/15/2010     REFERRING PROVIDER: Lily Kocher, MD  REFERRING DIAG: [phone] (ICD-10-CM) - Status post hip surgery / s/p arthroscopy of hip  S32.691K (ICD-10-CM) - Other specified fracture of right ischium, subsequent encounter for fracture with nonunion  THERAPY DIAG:  Pain in right hip  Muscle weakness (generalized)  Difficulty in walking, not elsewhere classified  Stiffness of right hip, not elsewhere classified  Rationale for Evaluation and Treatment: Rehabilitation  ONSET DATE: DOS  12/27/2022   SUBJECTIVE:   SUBJECTIVE STATEMENT: Pt reports that she was sore and tired after first aquatic session.  She reports that the front of R hip feels like cement.      Initial subjective Pt has a hx of hip surgeries.  She underwent R hip arthroscopy with femoroplasty, labral  repair, and pelvis osteotomy on 08/24/2019 (PAO).  Pt then had L hip labral repair and hip bone osteotomy, femoroplasty on 03/21/20.  Pt had hardware removal on 01/05/21 in bilat hips to help promote bone growth which did not help.   Pt underwent Revision R hip femoroplasty, arthroscopy with labral repair, open internal fixation post pelv ring Fx on 12/27/2022.  Pt had an ischial nonunion.  Pt had x rays which showed that the hardware is intact and there is progressive healing of the ischial nonunion.  MD note indicated to continue to increase activities as tolerated and continue with physical therapy with anticipated start of  aquatic physical therapy.  MD sent surgical protocol for arthroscopic hip labral repair with femoral osteoplasty.  Pt states she hurts with everything.  Pt is limited with ambulation distance, standing duration, and stairs due to pain.  Pt unable to bend over.  Pt unable to walk her dog and walk her neighborhood.  Pt has pain with driving.  Pt is very limited with household chores due to pain.  Sitting is affected due to her hip pain.   Pt hasn't performed therapy since the surgery.  Pt states dry needling helped her last time she was in therapy.  Pt wants to perform aquatic therapy.    PERTINENT HISTORY: Hx of hip surgeries: -Revision R hip femoroplasty, arthroscopy with labral repair, open internal fixation post pelv ring Fx on 12/27/2022  -R hip arthroscopy with femoroplasty, labral repair, and pelvis osteotomy on 08/24/2019 (PAO)  -L hip labral repair and hip bone osteotomy, femoroplasty on 03/21/20 -hardware removal on 01/05/21 in bilat hips     L ACL repair x 2 in 06 and 2016 GAD Migraines Ulcerative colitis and celiac Mast cell activation syndrome Cervical fusion in 2020 and 2021   PAIN:  NPRS:  5/10 current, 10/10 worst, 4/10 best Location:  R anterior and posteror hip, hamstring  PRECAUTIONS: Other: per surgical protocol, R hip labral repair, L ACL repair x 2, cervical fusion x 2   WEIGHT BEARING RESTRICTIONS:  none indicated on orders  FALLS:  Has patient fallen in last 6 months? No  LIVING ENVIRONMENT: Lives with: lives with their spouse Lives in:  2 story home but moving into a 1 story Stairs: yes   PLOF: Independent  PATIENT GOALS: to be pain-free, improve ROM, improve quality of life    OBJECTIVE:  Note: Objective measures were completed at Evaluation unless otherwise noted.  DIAGNOSTIC FINDINGS: Pelvis x ray on 04/05/23:   Findings/Impression:  Hardware: Right acetabular plate and screw fixation without complication. Alignment: No dislocation. Bones:  No acute fracture. Healing osteotomy of the right superior pubic ramus root. Joints: Mild degenerative changes of the left hip. Soft Tissues: Unremarkable.    PATIENT SURVEYS:  LEFS 41/80  COGNITION: Overall cognitive status: Within functional limits for tasks assessed          Pt states all of her incisions are healed and closed with no scabbing.    PALPATION: TTP:  anterior > lateral hip.  Tender in R glute and along entire HS to post knee   LOWER EXTREMITY ROM:  Active ROM Right eval Left eval  Hip flexion 105 118  Hip extension    Hip abduction 22 30  Hip adduction    Hip internal rotation 42 24  Hip external rotation 24 40  Knee flexion    Knee extension    Ankle dorsiflexion    Ankle plantarflexion    Ankle inversion  Ankle eversion     (Blank rows = not tested)  LOWER EXTREMITY MMT:  Strength not tested   GAIT: Assistive device utilized: None Level of assistance: Complete Independence Comments: Pt has an antalgic limp.  She favors R LE with gait and appears to have a R hip drop.  7/10 pain with ambulation.                                                                                                                                TREATMENT DATE:   [facility] Adult PT Treatment:                                                DATE: 05/09/23 Pt seen for aquatic therapy today.  Treatment took place in water 3.5-4.75 ft in depth at the [facility] pool. Temp of water was 91.  Pt entered/exited the pool via stairs and step to pattern with hand rail.  *unsupported: walking forward/ backward, cues for vertical trunk and even step length * side stepping with cues for slight increase in step height * TrA set with single rainbow hand float pull down to leg x 3 each -> pull down to tap knee tap same side x 3 each -> to opp knee x 2 each (some pinching in Rt groin- stopped) * UE of wall:  single leg clams x 8, Leg swings into hip flex/ext x 10, hip abdct/addct  x 10  * return to walking forward/ backward (slower pace) *straddling noodle, UE on yellow hand floats: cycling  * 3 way LE stretch with ankle on hollow noodle, 15s x 2 reps each position, each LE     PATIENT EDUCATION:  Education details: aquatic therapy exercise modifications/ progressions  Person educated: Patient Education method: Explanation, Demonstration, Tactile cues, Verbal cues, Education comprehension: verbalized understanding, returned demonstration, verbal cues required, tactile cues required, and needs further education  HOME EXERCISE PROGRAM: Access Code: URL: [email or web address] Date: 04/30/2023 Prepared by: Aaron Edelman  Exercises - Supine Transversus Abdominis Bracing - Hands on Stomach  - 2 x daily - 7 x weekly - 2 sets - 10 reps - 5 seconds hold - Bent Knee Fallouts  - 1-2 x daily - 7 x weekly - 1-2 sets - 10 reps  ASSESSMENT:  CLINICAL IMPRESSION: Pt reported some increase in soreness in R hip (both ant and post) during session; pain up to 6/10. Pt advised to move RLE in tolerable range and to reduce speed during session. May need some encouragement not to overdo.  Goals are ongoing. She should benefit from skilled PT services per protocol including aquatic therapy to address impairments and improve overall function.       Initial Impression Patient is a 38 y.o. female 17 weeks and 5 days s/p Revision R hip  femoroplasty, arthroscopy with labral repair, open internal fixation post pelv ring Fx on 12/27/2022.  Pt continues to have constant significant pain.  She is limited with her functional mobility skills and activities.  Pt is limited with ambulation distance, standing duration, and stairs due to pain.  Pt unable to walk her dog and walk her neighborhood.  She has pain with driving and is very limited with household chores due to pain.  Pt hasn't received any therapy since this past surgery.  She should benefit from skilled PT services  per protocol including aquatic therapy to address impairments and improve overall function.     OBJECTIVE IMPAIRMENTS: Abnormal gait, decreased activity tolerance, decreased endurance, decreased mobility, difficulty walking, decreased ROM, decreased strength, hypomobility, and pain.   ACTIVITY LIMITATIONS: lifting, bending, sitting, standing, squatting, stairs, transfers, and locomotion level  PARTICIPATION LIMITATIONS: cleaning, laundry, driving, shopping, and community activity  PERSONAL FACTORS: Time since onset of injury/illness/exacerbation and 3+ comorbidities: R hip labral repair, L ACL repair x 2, cervical fusion x 2  are also affecting patient's functional outcome.   REHAB POTENTIAL: Good  CLINICAL DECISION MAKING: Evolving/moderate complexity  EVALUATION COMPLEXITY: Moderate   GOALS:  SHORT TERM GOALS: Target date:  05/28/2023   Pt will be independent with HEP for improved pain, strength, tolerance to activity, and function.  Baseline: Goal status: INITIAL  2.  Pt will tolerate aquatic therapy without adverse effects in order to perform exercises in a reduced stress environment and improve functional mobility, tolerance to activity, strength, and pain.  Baseline:  Goal status: INITIAL Target date:  05/21/2023   3.  Pt's worst pain will be no > than 8/10 for improved tolerance to activity and mobility.  Baseline:  Goal status: INITIAL  4.  Pt will report at least a 25% improvement in her daily mobility.   Baseline:  Goal status: INITIAL  5.  Pt will demonstrate improved quality of gait including improved Wb'ing thru R LE, reduced hip drop, and reduced limp.  Baseline:  Goal status: INITIAL  6.  Pt will perform a 6 inch step up with good form and stability without significant pain Baseline:  Goal status: INITIAL Target date:  06/04/2023  7.  Pt will ambulate with a normalized heel to toe gait pattern without limping.   Goal status:  INITIAL  Target date:   06/18/2023    LONG TERM GOALS: Target date: 07/23/2023   Pt will ambulate extended community distance without significant pain and difficulty.  Baseline:  Goal status: INITIAL  2.  Pt will ascend and descend stairs with a reciprocal gait with a rail.  Baseline:  Goal status: INITIAL  3.  Pt will be able to walk her neighborhood without significant pain. Baseline:  Goal status: INITIAL  4.  Pt will be able to perform her ADLs and IADLs including household chores without significant pain and difficulty.  Baseline:  Goal status: INITIAL  5.  Pt will squat with symmetrical Wb'ing in bilat LE's and good form without increased pain for improved functional strength.  Baseline:  Goal status: INITIAL  6.  Pt will demo 4 to 4+/5 strength in R hip abd, 4/5 in R hip flex, and 5/5 in R knee ext for improved performance of and tolerance with functional mobility.  Baseline:  Goal status: INITIAL   PLAN:  PT FREQUENCY: 2x/week  PT DURATION: 12 weeks  PLANNED INTERVENTIONS: 97164- PT Re-evaluation, 97110-Therapeutic exercises, 97530- Therapeutic activity, [id number]- Neuromuscular re-education, 97535- Self Care, [id number]- Manual  therapy, [id number]- Gait training, 78469- Aquatic Therapy, [phone]- Electrical stimulation (unattended), Patient/Family education, Balance training, Stair training, Taping, Dry Needling, Joint mobilization, Spinal mobilization, Scar mobilization, Cryotherapy, and Moist heat  PLAN FOR NEXT SESSION: Start with aquatic therapy.  Plan for aquatic therapy x 3 weeks and then alternating land and aquatic therapy.  Cont per Dr. Theadore Nan protocol.  MD sent surgical protocol for arthroscopic hip labral repair with femoral osteoplasty.  Mayer Camel, PTA 05/09/23 11:54 AM [facility] [address] [place], [place], [address] Phone: [phone]   Fax:  [phone]

## 2023-05-15 ENCOUNTER — Encounter (HOSPITAL_BASED_OUTPATIENT_CLINIC_OR_DEPARTMENT_OTHER): Payer: Self-pay | Admitting: Physical Therapy

## 2023-05-15 ENCOUNTER — Ambulatory Visit (HOSPITAL_BASED_OUTPATIENT_CLINIC_OR_DEPARTMENT_OTHER): Admitting: Physical Therapy

## 2023-05-15 DIAGNOSIS — Z9889 Other specified postprocedural states: Secondary | ICD-10-CM | POA: Diagnosis not present

## 2023-05-15 DIAGNOSIS — M25551 Pain in right hip: Secondary | ICD-10-CM

## 2023-05-15 DIAGNOSIS — R262 Difficulty in walking, not elsewhere classified: Secondary | ICD-10-CM

## 2023-05-15 DIAGNOSIS — M6281 Muscle weakness (generalized): Secondary | ICD-10-CM

## 2023-05-15 NOTE — Therapy (Signed)
 OUTPATIENT PHYSICAL THERAPY LOWER EXTREMITY TREATMENT   Patient Name: [name] MRN: [id number] DOB:1985-07-07, 38 y.o., female Today's Date: 05/15/2023  END OF SESSION:  PT End of Session - 05/15/23 1812     Visit Number 4    Number of Visits 24    Date for PT Re-Evaluation 07/23/23    Authorization Type [employer]    PT Start Time 1622    PT Stop Time 1700    PT Time Calculation (min) 38 min    Activity Tolerance Patient tolerated treatment well    Behavior During Therapy WFL for tasks assessed/performed               Past Medical History:  Diagnosis Date   ACL graft tear (HCC)    left knee   Allergy    Angio-edema    Anxiety    Complication of anesthesia    woke up during surgery   Depression    Family history of adverse reaction to anesthesia     mom is difficult to put to sleep   IUD (intrauterine device) in place    Knee pain, right    Migraine headache    Sleep paralysis    Ulcerative proctitis (HCC)    Past Surgical History:  Procedure Laterality Date   ANTERIOR CRUCIATE LIGAMENT REPAIR Left 08/10/2014   Procedure: ANTERIOR CRUCIATE LIGAMENT (ACL) REPAIR;  Surgeon: Eugenia Mcalpine, MD;  Location: [facility];  Service: Orthopedics;  Laterality: Left;   HIP SURGERY Right 08/25/2019   PAO surgery [facility]   HIP SURGERY Bilateral 01/05/2021   [facility], had hardware taken out   KNEE ARTHROSCOPY Left 08/10/2014   Procedure: ARTHROSCOPY KNEE DEBRIDEMENT ALLOGRAFT ACL REVISION RECONSTRUCTION;  Surgeon: Eugenia Mcalpine, MD;  Location: [facility];  Service: Orthopedics;  Laterality: Left;   KNEE ARTHROSCOPY W/ ACL RECONSTRUCTION Left 2006   SPINAL FUSION     widom tooth extraction     only had 1 wisdom tooth    Patient Active Problem List   Diagnosis Date Noted   Ulcerative proctitis with rectal bleeding (HCC) 06/16/2020   Bloating 06/16/2020   Chronic migraine without aura without status migrainosus, not intractable  08/19/2018   Migraine with aura and with status migrainosus, not intractable 08/19/2018   Pain in left shoulder 08/19/2018   Pain in left ankle and joints of left foot 05/09/2018   Pain in left hip 05/09/2018   Cervicalgia 05/09/2018   Chronic rhinitis 11/12/2017   Angioedema 11/12/2017   Allergic reaction 11/12/2017   Chronic sinusitis 11/12/2017   Cervical intraepithelial neoplasia grade 1 10/01/2017   GAD (generalized anxiety disorder) 09/17/2014   ACL graft tear (HCC) 08/10/2014   S/P ACL reconstruction 08/10/2014   Migraine headache 05/15/2010     REFERRING PROVIDER: Lily Kocher, MD  REFERRING DIAG: [phone] (ICD-10-CM) - Status post hip surgery / s/p arthroscopy of hip  S32.691K (ICD-10-CM) - Other specified fracture of right ischium, subsequent encounter for fracture with nonunion  THERAPY DIAG:  Pain in right hip  Muscle weakness (generalized)  Difficulty in walking, not elsewhere classified  Rationale for Evaluation and Treatment: Rehabilitation  ONSET DATE: DOS  12/27/2022   SUBJECTIVE:   SUBJECTIVE STATEMENT: Pt States she just feels tight in hips    Initial subjective Pt has a hx of hip surgeries.  She underwent R hip arthroscopy with femoroplasty, labral repair, and pelvis osteotomy on 08/24/2019 (PAO).  Pt then had L hip labral repair and  hip bone osteotomy, femoroplasty on 03/21/20.  Pt had hardware removal on 01/05/21 in bilat hips to help promote bone growth which did not help.   Pt underwent Revision R hip femoroplasty, arthroscopy with labral repair, open internal fixation post pelv ring Fx on 12/27/2022.  Pt had an ischial nonunion.  Pt had x rays which showed that the hardware is intact and there is progressive healing of the ischial nonunion.  MD note indicated to continue to increase activities as tolerated and continue with physical therapy with anticipated start of aquatic physical therapy.  MD sent surgical protocol for arthroscopic hip  labral repair with femoral osteoplasty.  Pt states she hurts with everything.  Pt is limited with ambulation distance, standing duration, and stairs due to pain.  Pt unable to bend over.  Pt unable to walk her dog and walk her neighborhood.  Pt has pain with driving.  Pt is very limited with household chores due to pain.  Sitting is affected due to her hip pain.   Pt hasn't performed therapy since the surgery.  Pt states dry needling helped her last time she was in therapy.  Pt wants to perform aquatic therapy.    PERTINENT HISTORY: Hx of hip surgeries: -Revision R hip femoroplasty, arthroscopy with labral repair, open internal fixation post pelv ring Fx on 12/27/2022  -R hip arthroscopy with femoroplasty, labral repair, and pelvis osteotomy on 08/24/2019 (PAO)  -L hip labral repair and hip bone osteotomy, femoroplasty on 03/21/20 -hardware removal on 01/05/21 in bilat hips     L ACL repair x 2 in 06 and 2016 GAD Migraines Ulcerative colitis and celiac Mast cell activation syndrome Cervical fusion in 2020 and 2021   PAIN:  NPRS:  3/10 current, 10/10 worst, 4/10 best Location:  R anterior and posteror hip, hamstring  PRECAUTIONS: Other: per surgical protocol, R hip labral repair, L ACL repair x 2, cervical fusion x 2   WEIGHT BEARING RESTRICTIONS:  none indicated on orders  FALLS:  Has patient fallen in last 6 months? No  LIVING ENVIRONMENT: Lives with: lives with their spouse Lives in:  2 story home but moving into a 1 story Stairs: yes   PLOF: Independent  PATIENT GOALS: to be pain-free, improve ROM, improve quality of life    OBJECTIVE:  Note: Objective measures were completed at Evaluation unless otherwise noted.  DIAGNOSTIC FINDINGS: Pelvis x ray on 04/05/23:   Findings/Impression:  Hardware: Right acetabular plate and screw fixation without complication. Alignment: No dislocation. Bones: No acute fracture. Healing osteotomy of the right superior pubic ramus  root. Joints: Mild degenerative changes of the left hip. Soft Tissues: Unremarkable.    PATIENT SURVEYS:  LEFS 41/80  COGNITION: Overall cognitive status: Within functional limits for tasks assessed          Pt states all of her incisions are healed and closed with no scabbing.    PALPATION: TTP:  anterior > lateral hip.  Tender in R glute and along entire HS to post knee   LOWER EXTREMITY ROM:  Active ROM Right eval Left eval  Hip flexion 105 118  Hip extension    Hip abduction 22 30  Hip adduction    Hip internal rotation 42 24  Hip external rotation 24 40  Knee flexion    Knee extension    Ankle dorsiflexion    Ankle plantarflexion    Ankle inversion    Ankle eversion     (Blank rows = not tested)  LOWER  EXTREMITY MMT:  Strength not tested   GAIT: Assistive device utilized: None Level of assistance: Complete Independence Comments: Pt has an antalgic limp.  She favors R LE with gait and appears to have a R hip drop.  7/10 pain with ambulation.                                                                                                                                TREATMENT DATE:   [facility] Adult PT Treatment:                                                DATE: 05/09/23 Pt seen for aquatic therapy today.  Treatment took place in water 3.5-4.75 ft in depth at the [facility] pool. Temp of water was 91.  Pt entered/exited the pool via stairs and step to pattern with hand rail.  *unsupported: walking forward/ backward, cues for vertical trunk and even step length *quad and hip flex stretches using noodle *figure 4 stretches on steps *skipping x 2 widths-> hopping alternating legs 2 step hop x 4 widths. *hip circles R/L 10 cw&CCW *Runners step ups bottom step x 10 ue support light for balance; 2nd step x 5 *side lunge/shuffle x 4 widths 2 sets *curtsy squat R/L x 10 * side stepping with cues for slight increase in step height *cycling on noodle 3  sets 2 widths fast/1 width slow  Pt requires the buoyancy and hydrostatic pressure of water for support, and to offload joints by unweighting joint load by at least 50 % in navel deep water and by at least 75-80% in chest to neck deep water.  Viscosity of the water is needed for resistance of strengthening. Water current perturbations provides challenge to standing balance requiring increased core activation.      PATIENT EDUCATION:  Education details: aquatic therapy exercise modifications/ progressions  Person educated: Patient Education method: Explanation, Demonstration, Tactile cues, Verbal cues, Education comprehension: verbalized understanding, returned demonstration, verbal cues required, tactile cues required, and needs further education  HOME EXERCISE PROGRAM: Access Code: URL: [email or web address] Date: 04/30/2023 Prepared by: Aaron Edelman  Exercises - Supine Transversus Abdominis Bracing - Hands on Stomach  - 2 x daily - 7 x weekly - 2 sets - 10 reps - 5 seconds hold - Bent Knee Fallouts  - 1-2 x daily - 7 x weekly - 1-2 sets - 10 reps  ASSESSMENT:  CLINICAL IMPRESSION: Progressed pt as per protocol.  Increased intensity and reps of exercises.  She is given vc as well as demonstration throughout for execution.  Encourage to rest between.  Added cardio element.  Good session.      Initial Impression Patient is a 38 y.o. female 17 weeks and 5 days s/p Revision R hip femoroplasty, arthroscopy with labral repair, open  internal fixation post pelv ring Fx on 12/27/2022.  Pt continues to have constant significant pain.  She is limited with her functional mobility skills and activities.  Pt is limited with ambulation distance, standing duration, and stairs due to pain.  Pt unable to walk her dog and walk her neighborhood.  She has pain with driving and is very limited with household chores due to pain.  Pt hasn't received any therapy since this past  surgery.  She should benefit from skilled PT services per protocol including aquatic therapy to address impairments and improve overall function.     OBJECTIVE IMPAIRMENTS: Abnormal gait, decreased activity tolerance, decreased endurance, decreased mobility, difficulty walking, decreased ROM, decreased strength, hypomobility, and pain.   ACTIVITY LIMITATIONS: lifting, bending, sitting, standing, squatting, stairs, transfers, and locomotion level  PARTICIPATION LIMITATIONS: cleaning, laundry, driving, shopping, and community activity  PERSONAL FACTORS: Time since onset of injury/illness/exacerbation and 3+ comorbidities: R hip labral repair, L ACL repair x 2, cervical fusion x 2  are also affecting patient's functional outcome.   REHAB POTENTIAL: Good  CLINICAL DECISION MAKING: Evolving/moderate complexity  EVALUATION COMPLEXITY: Moderate   GOALS:  SHORT TERM GOALS: Target date:  05/28/2023   Pt will be independent with HEP for improved pain, strength, tolerance to activity, and function.  Baseline: Goal status: INITIAL  2.  Pt will tolerate aquatic therapy without adverse effects in order to perform exercises in a reduced stress environment and improve functional mobility, tolerance to activity, strength, and pain.  Baseline:  Goal status: INITIAL Target date:  05/21/2023   3.  Pt's worst pain will be no > than 8/10 for improved tolerance to activity and mobility.  Baseline:  Goal status: INITIAL  4.  Pt will report at least a 25% improvement in her daily mobility.   Baseline:  Goal status: INITIAL  5.  Pt will demonstrate improved quality of gait including improved Wb'ing thru R LE, reduced hip drop, and reduced limp.  Baseline:  Goal status: INITIAL  6.  Pt will perform a 6 inch step up with good form and stability without significant pain Baseline:  Goal status: INITIAL Target date:  06/04/2023  7.  Pt will ambulate with a normalized heel to toe gait pattern without  limping.   Goal status:  INITIAL  Target date:  06/18/2023    LONG TERM GOALS: Target date: 07/23/2023   Pt will ambulate extended community distance without significant pain and difficulty.  Baseline:  Goal status: INITIAL  2.  Pt will ascend and descend stairs with a reciprocal gait with a rail.  Baseline:  Goal status: INITIAL  3.  Pt will be able to walk her neighborhood without significant pain. Baseline:  Goal status: INITIAL  4.  Pt will be able to perform her ADLs and IADLs including household chores without significant pain and difficulty.  Baseline:  Goal status: INITIAL  5.  Pt will squat with symmetrical Wb'ing in bilat LE's and good form without increased pain for improved functional strength.  Baseline:  Goal status: INITIAL  6.  Pt will demo 4 to 4+/5 strength in R hip abd, 4/5 in R hip flex, and 5/5 in R knee ext for improved performance of and tolerance with functional mobility.  Baseline:  Goal status: INITIAL   PLAN:  PT FREQUENCY: 2x/week  PT DURATION: 12 weeks  PLANNED INTERVENTIONS: 97164- PT Re-evaluation, 97110-Therapeutic exercises, 97530- Therapeutic activity, [id number]- Neuromuscular re-education, 97535- Self Care, 16109- Manual therapy, [id number]- Gait training, [phone]- Aquatic  Therapy, [id number]- Electrical stimulation (unattended), Patient/Family education, Balance training, Stair training, Taping, Dry Needling, Joint mobilization, Spinal mobilization, Scar mobilization, Cryotherapy, and Moist heat  PLAN FOR NEXT SESSION: Start with aquatic therapy.  Plan for aquatic therapy x 3 weeks and then alternating land and aquatic therapy.  Cont per Dr. Theadore Nan protocol.  MD sent surgical protocol for arthroscopic hip labral repair with femoral osteoplasty.  Corrie Dandy [place]) Deshun Sedivy MPT 05/15/23 6:17 PM [facility] [address] [place], [place], [address] Phone: [phone]   Fax:  [phone]

## 2023-05-17 ENCOUNTER — Encounter (HOSPITAL_BASED_OUTPATIENT_CLINIC_OR_DEPARTMENT_OTHER): Payer: Self-pay | Admitting: Physical Therapy

## 2023-05-17 ENCOUNTER — Ambulatory Visit (HOSPITAL_BASED_OUTPATIENT_CLINIC_OR_DEPARTMENT_OTHER): Admitting: Physical Therapy

## 2023-05-17 ENCOUNTER — Other Ambulatory Visit

## 2023-05-17 DIAGNOSIS — R197 Diarrhea, unspecified: Secondary | ICD-10-CM

## 2023-05-17 DIAGNOSIS — Z9889 Other specified postprocedural states: Secondary | ICD-10-CM | POA: Diagnosis not present

## 2023-05-17 DIAGNOSIS — K51211 Ulcerative (chronic) proctitis with rectal bleeding: Secondary | ICD-10-CM

## 2023-05-17 DIAGNOSIS — M25551 Pain in right hip: Secondary | ICD-10-CM

## 2023-05-17 DIAGNOSIS — R262 Difficulty in walking, not elsewhere classified: Secondary | ICD-10-CM

## 2023-05-17 DIAGNOSIS — M6281 Muscle weakness (generalized): Secondary | ICD-10-CM

## 2023-05-17 NOTE — Therapy (Signed)
 OUTPATIENT PHYSICAL THERAPY LOWER EXTREMITY TREATMENT   Patient Name: [name] MRN: [id number] DOB:1986/01/14, 38 y.o., female Today's Date: 05/17/2023  END OF SESSION:  PT End of Session - 05/17/23 1416     Visit Number 5    Number of Visits 24    Date for PT Re-Evaluation 07/23/23    Authorization Type [employer]    PT Start Time 1412    PT Stop Time 1450    PT Time Calculation (min) 38 min    Activity Tolerance Patient tolerated treatment well    Behavior During Therapy WFL for tasks assessed/performed                Past Medical History:  Diagnosis Date   ACL graft tear (HCC)    left knee   Allergy    Angio-edema    Anxiety    Complication of anesthesia    woke up during surgery   Depression    Family history of adverse reaction to anesthesia     mom is difficult to put to sleep   IUD (intrauterine device) in place    Knee pain, right    Migraine headache    Sleep paralysis    Ulcerative proctitis (HCC)    Past Surgical History:  Procedure Laterality Date   ANTERIOR CRUCIATE LIGAMENT REPAIR Left 08/10/2014   Procedure: ANTERIOR CRUCIATE LIGAMENT (ACL) REPAIR;  Surgeon: Eugenia Mcalpine, MD;  Location: [facility];  Service: Orthopedics;  Laterality: Left;   HIP SURGERY Right 08/25/2019   PAO surgery [facility]   HIP SURGERY Bilateral 01/05/2021   [facility], had hardware taken out   KNEE ARTHROSCOPY Left 08/10/2014   Procedure: ARTHROSCOPY KNEE DEBRIDEMENT ALLOGRAFT ACL REVISION RECONSTRUCTION;  Surgeon: Eugenia Mcalpine, MD;  Location: [facility];  Service: Orthopedics;  Laterality: Left;   KNEE ARTHROSCOPY W/ ACL RECONSTRUCTION Left 2006   SPINAL FUSION     widom tooth extraction     only had 1 wisdom tooth    Patient Active Problem List   Diagnosis Date Noted   Ulcerative proctitis with rectal bleeding (HCC) 06/16/2020   Bloating 06/16/2020   Chronic migraine without aura without status migrainosus, not intractable  08/19/2018   Migraine with aura and with status migrainosus, not intractable 08/19/2018   Pain in left shoulder 08/19/2018   Pain in left ankle and joints of left foot 05/09/2018   Pain in left hip 05/09/2018   Cervicalgia 05/09/2018   Chronic rhinitis 11/12/2017   Angioedema 11/12/2017   Allergic reaction 11/12/2017   Chronic sinusitis 11/12/2017   Cervical intraepithelial neoplasia grade 1 10/01/2017   GAD (generalized anxiety disorder) 09/17/2014   ACL graft tear (HCC) 08/10/2014   S/P ACL reconstruction 08/10/2014   Migraine headache 05/15/2010     REFERRING PROVIDER: Lily Kocher, MD  REFERRING DIAG: [phone] (ICD-10-CM) - Status post hip surgery / s/p arthroscopy of hip  S32.691K (ICD-10-CM) - Other specified fracture of right ischium, subsequent encounter for fracture with nonunion  THERAPY DIAG:  Pain in right hip  Muscle weakness (generalized)  Difficulty in walking, not elsewhere classified  Rationale for Evaluation and Treatment: Rehabilitation  ONSET DATE: DOS  12/27/2022   SUBJECTIVE:   SUBJECTIVE STATEMENT: Pt States she just feels tight in hips    Initial subjective Pt has a hx of hip surgeries.  She underwent R hip arthroscopy with femoroplasty, labral repair, and pelvis osteotomy on 08/24/2019 (PAO).  Pt then had L hip labral repair  and hip bone osteotomy, femoroplasty on 03/21/20.  Pt had hardware removal on 01/05/21 in bilat hips to help promote bone growth which did not help.   Pt underwent Revision R hip femoroplasty, arthroscopy with labral repair, open internal fixation post pelv ring Fx on 12/27/2022.  Pt had an ischial nonunion.  Pt had x rays which showed that the hardware is intact and there is progressive healing of the ischial nonunion.  MD note indicated to continue to increase activities as tolerated and continue with physical therapy with anticipated start of aquatic physical therapy.  MD sent surgical protocol for arthroscopic hip  labral repair with femoral osteoplasty.  Pt states she hurts with everything.  Pt is limited with ambulation distance, standing duration, and stairs due to pain.  Pt unable to bend over.  Pt unable to walk her dog and walk her neighborhood.  Pt has pain with driving.  Pt is very limited with household chores due to pain.  Sitting is affected due to her hip pain.   Pt hasn't performed therapy since the surgery.  Pt states dry needling helped her last time she was in therapy.  Pt wants to perform aquatic therapy.    PERTINENT HISTORY: Hx of hip surgeries: -Revision R hip femoroplasty, arthroscopy with labral repair, open internal fixation post pelv ring Fx on 12/27/2022  -R hip arthroscopy with femoroplasty, labral repair, and pelvis osteotomy on 08/24/2019 (PAO)  -L hip labral repair and hip bone osteotomy, femoroplasty on 03/21/20 -hardware removal on 01/05/21 in bilat hips     L ACL repair x 2 in 06 and 2016 GAD Migraines Ulcerative colitis and celiac Mast cell activation syndrome Cervical fusion in 2020 and 2021   PAIN:  NPRS:  3/10 current, 10/10 worst, 4/10 best Location:  R anterior and posteror hip, hamstring  PRECAUTIONS: Other: per surgical protocol, R hip labral repair, L ACL repair x 2, cervical fusion x 2   WEIGHT BEARING RESTRICTIONS:  none indicated on orders  FALLS:  Has patient fallen in last 6 months? No  LIVING ENVIRONMENT: Lives with: lives with their spouse Lives in:  2 story home but moving into a 1 story Stairs: yes   PLOF: Independent  PATIENT GOALS: to be pain-free, improve ROM, improve quality of life    OBJECTIVE:  Note: Objective measures were completed at Evaluation unless otherwise noted.  DIAGNOSTIC FINDINGS: Pelvis x ray on 04/05/23:   Findings/Impression:  Hardware: Right acetabular plate and screw fixation without complication. Alignment: No dislocation. Bones: No acute fracture. Healing osteotomy of the right superior pubic ramus  root. Joints: Mild degenerative changes of the left hip. Soft Tissues: Unremarkable.    PATIENT SURVEYS:  LEFS 41/80  COGNITION: Overall cognitive status: Within functional limits for tasks assessed          Pt states all of her incisions are healed and closed with no scabbing.    PALPATION: TTP:  anterior > lateral hip.  Tender in R glute and along entire HS to post knee   LOWER EXTREMITY ROM:  Active ROM Right eval Left eval  Hip flexion 105 118  Hip extension    Hip abduction 22 30  Hip adduction    Hip internal rotation 42 24  Hip external rotation 24 40  Knee flexion    Knee extension    Ankle dorsiflexion    Ankle plantarflexion    Ankle inversion    Ankle eversion     (Blank rows = not tested)  LOWER EXTREMITY MMT:  Strength not tested   GAIT: Assistive device utilized: None Level of assistance: Complete Independence Comments: Pt has an antalgic limp.  She favors R LE with gait and appears to have a R hip drop.  7/10 pain with ambulation.                                                                                                                                TREATMENT DATE:   [facility] Adult PT Treatment:                                                DATE: 05/16/23 Pt seen for aquatic therapy today.  Treatment took place in water 3.5-4.75 ft in depth at the [facility] pool. Temp of water was 86.  Pt entered/exited the pool via stairs and step to pattern with hand rail.  *unsupported: walking forward/ backward, cues for vertical trunk and even step length *skipping x 1 length *side lunge/shuffle 2 full lengths *SLR kick 1/2 length *quad and hip flex stretches using noodle *3 way stretch using noodle *using ankle foam leg swings 2 x 5; hip circles cw&CCW 2 sets of 5 ea; hip add/abd x10; plank on step with hip extensionx 10 *Runners step ups bottom step 2x 5 ue support light for balance; 2nd step x 5 with ankle buoys *cycling on noodle  alternating fast/slow x 4 lengths  Pt requires the buoyancy and hydrostatic pressure of water for support, and to offload joints by unweighting joint load by at least 50 % in navel deep water and by at least 75-80% in chest to neck deep water.  Viscosity of the water is needed for resistance of strengthening. Water current perturbations provides challenge to standing balance requiring increased core activation.      PATIENT EDUCATION:  Education details: aquatic therapy exercise modifications/ progressions  Person educated: Patient Education method: Explanation, Demonstration, Tactile cues, Verbal cues, Education comprehension: verbalized understanding, returned demonstration, verbal cues required, tactile cues required, and needs further education  HOME EXERCISE PROGRAM: Access Code: URL: [email or web address] Date: 04/30/2023 Prepared by: Aaron Edelman  Exercises - Supine Transversus Abdominis Bracing - Hands on Stomach  - 2 x daily - 7 x weekly - 2 sets - 10 reps - 5 seconds hold - Bent Knee Fallouts  - 1-2 x daily - 7 x weekly - 1-2 sets - 10 reps  ASSESSMENT:  CLINICAL IMPRESSION: Added ankle foam for added resistance requiring increased eccentric control of movements throughout majority of session.  Cues for decreased circle circumference with r hip to avoid "popping". Pt reports some slight discomfort after last session but not out of ordinary.  Good effort.  Goals ongoing        Initial Impression Patient is a 38 y.o. female 17 weeks  and 5 days s/p Revision R hip femoroplasty, arthroscopy with labral repair, open internal fixation post pelv ring Fx on 12/27/2022.  Pt continues to have constant significant pain.  She is limited with her functional mobility skills and activities.  Pt is limited with ambulation distance, standing duration, and stairs due to pain.  Pt unable to walk her dog and walk her neighborhood.  She has pain with driving and is very  limited with household chores due to pain.  Pt hasn't received any therapy since this past surgery.  She should benefit from skilled PT services per protocol including aquatic therapy to address impairments and improve overall function.     OBJECTIVE IMPAIRMENTS: Abnormal gait, decreased activity tolerance, decreased endurance, decreased mobility, difficulty walking, decreased ROM, decreased strength, hypomobility, and pain.   ACTIVITY LIMITATIONS: lifting, bending, sitting, standing, squatting, stairs, transfers, and locomotion level  PARTICIPATION LIMITATIONS: cleaning, laundry, driving, shopping, and community activity  PERSONAL FACTORS: Time since onset of injury/illness/exacerbation and 3+ comorbidities: R hip labral repair, L ACL repair x 2, cervical fusion x 2  are also affecting patient's functional outcome.   REHAB POTENTIAL: Good  CLINICAL DECISION MAKING: Evolving/moderate complexity  EVALUATION COMPLEXITY: Moderate   GOALS:  SHORT TERM GOALS: Target date:  05/28/2023   Pt will be independent with HEP for improved pain, strength, tolerance to activity, and function.  Baseline: Goal status: INITIAL  2.  Pt will tolerate aquatic therapy without adverse effects in order to perform exercises in a reduced stress environment and improve functional mobility, tolerance to activity, strength, and pain.  Baseline:  Goal status: INITIAL Target date:  05/21/2023   3.  Pt's worst pain will be no > than 8/10 for improved tolerance to activity and mobility.  Baseline:  Goal status: INITIAL  4.  Pt will report at least a 25% improvement in her daily mobility.   Baseline:  Goal status: INITIAL  5.  Pt will demonstrate improved quality of gait including improved Wb'ing thru R LE, reduced hip drop, and reduced limp.  Baseline:  Goal status: INITIAL  6.  Pt will perform a 6 inch step up with good form and stability without significant pain Baseline:  Goal status: INITIAL Target  date:  06/04/2023  7.  Pt will ambulate with a normalized heel to toe gait pattern without limping.   Goal status:  INITIAL  Target date:  06/18/2023    LONG TERM GOALS: Target date: 07/23/2023   Pt will ambulate extended community distance without significant pain and difficulty.  Baseline:  Goal status: INITIAL  2.  Pt will ascend and descend stairs with a reciprocal gait with a rail.  Baseline:  Goal status: INITIAL  3.  Pt will be able to walk her neighborhood without significant pain. Baseline:  Goal status: INITIAL  4.  Pt will be able to perform her ADLs and IADLs including household chores without significant pain and difficulty.  Baseline:  Goal status: INITIAL  5.  Pt will squat with symmetrical Wb'ing in bilat LE's and good form without increased pain for improved functional strength.  Baseline:  Goal status: INITIAL  6.  Pt will demo 4 to 4+/5 strength in R hip abd, 4/5 in R hip flex, and 5/5 in R knee ext for improved performance of and tolerance with functional mobility.  Baseline:  Goal status: INITIAL   PLAN:  PT FREQUENCY: 2x/week  PT DURATION: 12 weeks  PLANNED INTERVENTIONS: 97164- PT Re-evaluation, 97110-Therapeutic exercises, 97530- Therapeutic activity, 97112-  Neuromuscular re-education, [phone]- Self Care, 60454- Manual therapy, [phone]- Gait training, [phone]- Aquatic Therapy, [phone]- Electrical stimulation (unattended), Patient/Family education, Balance training, Stair training, Taping, Dry Needling, Joint mobilization, Spinal mobilization, Scar mobilization, Cryotherapy, and Moist heat  PLAN FOR NEXT SESSION: Start with aquatic therapy.  Plan for aquatic therapy x 3 weeks and then alternating land and aquatic therapy.  Cont per Dr. Theadore Nan protocol.  MD sent surgical protocol for arthroscopic hip labral repair with femoral osteoplasty.  [address] [place]) Felicidad Sugarman MPT 05/17/23 2:54 PM [facility] [address] [place], [place], [address] Phone: [phone]   Fax:  [phone]

## 2023-05-20 LAB — CALPROTECTIN, FECAL: Calprotectin, Fecal: 23 ug/g (ref 0–120)

## 2023-05-21 ENCOUNTER — Encounter (HOSPITAL_BASED_OUTPATIENT_CLINIC_OR_DEPARTMENT_OTHER): Payer: Self-pay | Admitting: Physical Therapy

## 2023-05-21 ENCOUNTER — Ambulatory Visit (HOSPITAL_BASED_OUTPATIENT_CLINIC_OR_DEPARTMENT_OTHER): Admitting: Physical Therapy

## 2023-05-21 DIAGNOSIS — R262 Difficulty in walking, not elsewhere classified: Secondary | ICD-10-CM

## 2023-05-21 DIAGNOSIS — M25551 Pain in right hip: Secondary | ICD-10-CM

## 2023-05-21 DIAGNOSIS — M6281 Muscle weakness (generalized): Secondary | ICD-10-CM

## 2023-05-21 DIAGNOSIS — Z9889 Other specified postprocedural states: Secondary | ICD-10-CM | POA: Diagnosis not present

## 2023-05-21 DIAGNOSIS — M25651 Stiffness of right hip, not elsewhere classified: Secondary | ICD-10-CM

## 2023-05-21 NOTE — Therapy (Signed)
 OUTPATIENT PHYSICAL THERAPY LOWER EXTREMITY TREATMENT   Patient Name: [name] MRN: [id number] DOB:Jan 05, 1986, 38 y.o., female Today's Date: 05/21/2023  END OF SESSION:  PT End of Session - 05/21/23 1535     Visit Number 6    Number of Visits 24    Date for PT Re-Evaluation 07/23/23    Authorization Type [employer]    PT Start Time 1533    PT Stop Time 1615    PT Time Calculation (min) 42 min    Activity Tolerance Patient tolerated treatment well    Behavior During Therapy WFL for tasks assessed/performed                Past Medical History:  Diagnosis Date   ACL graft tear (HCC)    left knee   Allergy    Angio-edema    Anxiety    Complication of anesthesia    woke up during surgery   Depression    Family history of adverse reaction to anesthesia     mom is difficult to put to sleep   IUD (intrauterine device) in place    Knee pain, right    Migraine headache    Sleep paralysis    Ulcerative proctitis (HCC)    Past Surgical History:  Procedure Laterality Date   ANTERIOR CRUCIATE LIGAMENT REPAIR Left 08/10/2014   Procedure: ANTERIOR CRUCIATE LIGAMENT (ACL) REPAIR;  Surgeon: Eugenia Mcalpine, MD;  Location: [facility];  Service: Orthopedics;  Laterality: Left;   HIP SURGERY Right 08/25/2019   PAO surgery [facility]   HIP SURGERY Bilateral 01/05/2021   [facility], had hardware taken out   KNEE ARTHROSCOPY Left 08/10/2014   Procedure: ARTHROSCOPY KNEE DEBRIDEMENT ALLOGRAFT ACL REVISION RECONSTRUCTION;  Surgeon: Eugenia Mcalpine, MD;  Location: [facility];  Service: Orthopedics;  Laterality: Left;   KNEE ARTHROSCOPY W/ ACL RECONSTRUCTION Left 2006   SPINAL FUSION     widom tooth extraction     only had 1 wisdom tooth    Patient Active Problem List   Diagnosis Date Noted   Ulcerative proctitis with rectal bleeding (HCC) 06/16/2020   Bloating 06/16/2020   Chronic migraine without aura without status migrainosus, not intractable  08/19/2018   Migraine with aura and with status migrainosus, not intractable 08/19/2018   Pain in left shoulder 08/19/2018   Pain in left ankle and joints of left foot 05/09/2018   Pain in left hip 05/09/2018   Cervicalgia 05/09/2018   Chronic rhinitis 11/12/2017   Angioedema 11/12/2017   Allergic reaction 11/12/2017   Chronic sinusitis 11/12/2017   Cervical intraepithelial neoplasia grade 1 10/01/2017   GAD (generalized anxiety disorder) 09/17/2014   ACL graft tear (HCC) 08/10/2014   S/P ACL reconstruction 08/10/2014   Migraine headache 05/15/2010     REFERRING PROVIDER: Lily Kocher, MD  REFERRING DIAG: [phone] (ICD-10-CM) - Status post hip surgery / s/p arthroscopy of hip  S32.691K (ICD-10-CM) - Other specified fracture of right ischium, subsequent encounter for fracture with nonunion  THERAPY DIAG:  Pain in right hip  Muscle weakness (generalized)  Difficulty in walking, not elsewhere classified  Stiffness of right hip, not elsewhere classified  Rationale for Evaluation and Treatment: Rehabilitation  ONSET DATE: DOS  12/27/2022   SUBJECTIVE:   SUBJECTIVE STATEMENT: Pt States some soreness but "I feel I am doing what I should be doing now" Have scar massage scheduled for tomorrow.    Initial subjective Pt has a hx of hip surgeries.  She  underwent R hip arthroscopy with femoroplasty, labral repair, and pelvis osteotomy on 08/24/2019 (PAO).  Pt then had L hip labral repair and hip bone osteotomy, femoroplasty on 03/21/20.  Pt had hardware removal on 01/05/21 in bilat hips to help promote bone growth which did not help.   Pt underwent Revision R hip femoroplasty, arthroscopy with labral repair, open internal fixation post pelv ring Fx on 12/27/2022.  Pt had an ischial nonunion.  Pt had x rays which showed that the hardware is intact and there is progressive healing of the ischial nonunion.  MD note indicated to continue to increase activities as tolerated and  continue with physical therapy with anticipated start of aquatic physical therapy.  MD sent surgical protocol for arthroscopic hip labral repair with femoral osteoplasty.  Pt states she hurts with everything.  Pt is limited with ambulation distance, standing duration, and stairs due to pain.  Pt unable to bend over.  Pt unable to walk her dog and walk her neighborhood.  Pt has pain with driving.  Pt is very limited with household chores due to pain.  Sitting is affected due to her hip pain.   Pt hasn't performed therapy since the surgery.  Pt states dry needling helped her last time she was in therapy.  Pt wants to perform aquatic therapy.    PERTINENT HISTORY: Hx of hip surgeries: -Revision R hip femoroplasty, arthroscopy with labral repair, open internal fixation post pelv ring Fx on 12/27/2022  -R hip arthroscopy with femoroplasty, labral repair, and pelvis osteotomy on 08/24/2019 (PAO)  -L hip labral repair and hip bone osteotomy, femoroplasty on 03/21/20 -hardware removal on 01/05/21 in bilat hips     L ACL repair x 2 in 06 and 2016 GAD Migraines Ulcerative colitis and celiac Mast cell activation syndrome Cervical fusion in 2020 and 2021   PAIN:  NPRS:  5/10 current, 10/10 worst, 4/10 best Location:  R anterior and posteror hip, hamstring  PRECAUTIONS: Other: per surgical protocol, R hip labral repair, L ACL repair x 2, cervical fusion x 2   WEIGHT BEARING RESTRICTIONS:  none indicated on orders  FALLS:  Has patient fallen in last 6 months? No  LIVING ENVIRONMENT: Lives with: lives with their spouse Lives in:  2 story home but moving into a 1 story Stairs: yes   PLOF: Independent  PATIENT GOALS: to be pain-free, improve ROM, improve quality of life    OBJECTIVE:  Note: Objective measures were completed at Evaluation unless otherwise noted.  DIAGNOSTIC FINDINGS: Pelvis x ray on 04/05/23:   Findings/Impression:  Hardware: Right acetabular plate and screw fixation  without complication. Alignment: No dislocation. Bones: No acute fracture. Healing osteotomy of the right superior pubic ramus root. Joints: Mild degenerative changes of the left hip. Soft Tissues: Unremarkable.    PATIENT SURVEYS:  LEFS 41/80  COGNITION: Overall cognitive status: Within functional limits for tasks assessed          Pt states all of her incisions are healed and closed with no scabbing.    PALPATION: TTP:  anterior > lateral hip.  Tender in R glute and along entire HS to post knee   LOWER EXTREMITY ROM:  Active ROM Right eval Left eval  Hip flexion 105 118  Hip extension    Hip abduction 22 30  Hip adduction    Hip internal rotation 42 24  Hip external rotation 24 40  Knee flexion    Knee extension    Ankle dorsiflexion  Ankle plantarflexion    Ankle inversion    Ankle eversion     (Blank rows = not tested)  LOWER EXTREMITY MMT:  Strength not tested   GAIT: Assistive device utilized: None Level of assistance: Complete Independence Comments: Pt has an antalgic limp.  She favors R LE with gait and appears to have a R hip drop.  7/10 pain with ambulation.                                                                                                                                TREATMENT DATE:   [facility] Adult PT Treatment:                                                DATE: 05/16/23 Pt seen for aquatic therapy today.  Treatment took place in water 3.5-4.75 ft in depth at the [facility] pool. Temp of water was 92.  Pt entered/exited the pool via stairs and step to pattern with hand rail.  *unsupported: walking forward/ backward, cues for vertical trunk and even step length *skipping 3 sets 1 min *side lunge/shuffle 3 sets of 1 min *yellow noodle stomp hip in neutral then externally rotated 2 sets 10 slow then 10 fast unsupported *SLR kick x 4 widths then x 2 *quad and hip flex stretches using noodle *using ankle foam leg swings 2 x  5; hip circles cw&CCW x10 ea; hip add/abd 2 x5  *cycling on noodle alternating fast/slow x 4 lengths  Pt requires the buoyancy and hydrostatic pressure of water for support, and to offload joints by unweighting joint load by at least 50 % in navel deep water and by at least 75-80% in chest to neck deep water.  Viscosity of the water is needed for resistance of strengthening. Water current perturbations provides challenge to standing balance requiring increased core activation.      PATIENT EDUCATION:  Education details: aquatic therapy exercise modifications/ progressions  Person educated: Patient Education method: Explanation, Demonstration, Tactile cues, Verbal cues, Education comprehension: verbalized understanding, returned demonstration, verbal cues required, tactile cues required, and needs further education  HOME EXERCISE PROGRAM: Access Code: URL: [email or web address] Date: 04/30/2023 Prepared by: Aaron Edelman  Exercises - Supine Transversus Abdominis Bracing - Hands on Stomach  - 2 x daily - 7 x weekly - 2 sets - 10 reps - 5 seconds hold - Bent Knee Fallouts  - 1-2 x daily - 7 x weekly - 1-2 sets - 10 reps  ASSESSMENT:  CLINICAL IMPRESSION:  Continued with the ankle foam for added resistance.  Progressed program measuring some with time vs reps.  Completed in warm water therapy pool vs lap pool as last session.  She does report some soreness after session but not "scary" hurt. Goals ongoing.    Initial Impression  Patient is a 38 y.o. female 17 weeks and 5 days s/p Revision R hip femoroplasty, arthroscopy with labral repair, open internal fixation post pelv ring Fx on 12/27/2022.  Pt continues to have constant significant pain.  She is limited with her functional mobility skills and activities.  Pt is limited with ambulation distance, standing duration, and stairs due to pain.  Pt unable to walk her dog and walk her neighborhood.  She has pain with  driving and is very limited with household chores due to pain.  Pt hasn't received any therapy since this past surgery.  She should benefit from skilled PT services per protocol including aquatic therapy to address impairments and improve overall function.     OBJECTIVE IMPAIRMENTS: Abnormal gait, decreased activity tolerance, decreased endurance, decreased mobility, difficulty walking, decreased ROM, decreased strength, hypomobility, and pain.   ACTIVITY LIMITATIONS: lifting, bending, sitting, standing, squatting, stairs, transfers, and locomotion level  PARTICIPATION LIMITATIONS: cleaning, laundry, driving, shopping, and community activity  PERSONAL FACTORS: Time since onset of injury/illness/exacerbation and 3+ comorbidities: R hip labral repair, L ACL repair x 2, cervical fusion x 2  are also affecting patient's functional outcome.   REHAB POTENTIAL: Good  CLINICAL DECISION MAKING: Evolving/moderate complexity  EVALUATION COMPLEXITY: Moderate   GOALS:  SHORT TERM GOALS: Target date:  05/28/2023   Pt will be independent with HEP for improved pain, strength, tolerance to activity, and function.  Baseline: Goal status: INITIAL  2.  Pt will tolerate aquatic therapy without adverse effects in order to perform exercises in a reduced stress environment and improve functional mobility, tolerance to activity, strength, and pain.  Baseline:  Goal status: INITIAL Target date:  05/21/2023   3.  Pt's worst pain will be no > than 8/10 for improved tolerance to activity and mobility.  Baseline:  Goal status: INITIAL  4.  Pt will report at least a 25% improvement in her daily mobility.   Baseline:  Goal status: INITIAL  5.  Pt will demonstrate improved quality of gait including improved Wb'ing thru R LE, reduced hip drop, and reduced limp.  Baseline:  Goal status: INITIAL  6.  Pt will perform a 6 inch step up with good form and stability without significant pain Baseline:  Goal status:  INITIAL Target date:  06/04/2023  7.  Pt will ambulate with a normalized heel to toe gait pattern without limping.   Goal status:  INITIAL  Target date:  06/18/2023    LONG TERM GOALS: Target date: 07/23/2023   Pt will ambulate extended community distance without significant pain and difficulty.  Baseline:  Goal status: INITIAL  2.  Pt will ascend and descend stairs with a reciprocal gait with a rail.  Baseline:  Goal status: INITIAL  3.  Pt will be able to walk her neighborhood without significant pain. Baseline:  Goal status: INITIAL  4.  Pt will be able to perform her ADLs and IADLs including household chores without significant pain and difficulty.  Baseline:  Goal status: INITIAL  5.  Pt will squat with symmetrical Wb'ing in bilat LE's and good form without increased pain for improved functional strength.  Baseline:  Goal status: INITIAL  6.  Pt will demo 4 to 4+/5 strength in R hip abd, 4/5 in R hip flex, and 5/5 in R knee ext for improved performance of and tolerance with functional mobility.  Baseline:  Goal status: INITIAL   PLAN:  PT FREQUENCY: 2x/week  PT DURATION: 12 weeks  PLANNED INTERVENTIONS: 60630-  PT Re-evaluation, 97110-Therapeutic exercises, 97530- Therapeutic activity, [id number]- Neuromuscular re-education, [phone]- Self Care, 60454- Manual therapy, [phone]- Gait training, [phone]- Aquatic Therapy, [phone]- Electrical stimulation (unattended), Patient/Family education, Balance training, Stair training, Taping, Dry Needling, Joint mobilization, Spinal mobilization, Scar mobilization, Cryotherapy, and Moist heat  PLAN FOR NEXT SESSION: Start with aquatic therapy.  Plan for aquatic therapy x 3 weeks and then alternating land and aquatic therapy.  Cont per Dr. Theadore Nan protocol.  MD sent surgical protocol for arthroscopic hip labral repair with femoral osteoplasty.  Shannon Dandy [place]) Luisangel Wainright MPT 05/21/23 3:37 PM [facility] [address] [place], [place], [address] Phone: [phone]   Fax:  [phone]

## 2023-05-24 ENCOUNTER — Ambulatory Visit (HOSPITAL_BASED_OUTPATIENT_CLINIC_OR_DEPARTMENT_OTHER): Admitting: Physical Therapy

## 2023-05-24 ENCOUNTER — Encounter (HOSPITAL_BASED_OUTPATIENT_CLINIC_OR_DEPARTMENT_OTHER): Payer: Self-pay | Admitting: Physical Therapy

## 2023-05-24 DIAGNOSIS — R262 Difficulty in walking, not elsewhere classified: Secondary | ICD-10-CM

## 2023-05-24 DIAGNOSIS — Z9889 Other specified postprocedural states: Secondary | ICD-10-CM | POA: Diagnosis not present

## 2023-05-24 DIAGNOSIS — M6281 Muscle weakness (generalized): Secondary | ICD-10-CM

## 2023-05-24 DIAGNOSIS — M25551 Pain in right hip: Secondary | ICD-10-CM

## 2023-05-24 DIAGNOSIS — M25651 Stiffness of right hip, not elsewhere classified: Secondary | ICD-10-CM

## 2023-05-24 NOTE — Therapy (Signed)
 OUTPATIENT PHYSICAL THERAPY LOWER EXTREMITY TREATMENT   Patient Name: [name] MRN: [id number] DOB:1985-08-04, 38 y.o., female Today's Date: 05/24/2023  END OF SESSION:  PT End of Session - 05/24/23 1525     Visit Number 7    Number of Visits 24    Date for PT Re-Evaluation 07/23/23    Authorization Type [employer]    PT Start Time 1522   pt arrived late to pool area   PT Stop Time 1600    PT Time Calculation (min) 38 min    Behavior During Therapy WFL for tasks assessed/performed                Past Medical History:  Diagnosis Date   ACL graft tear (HCC)    left knee   Allergy    Angio-edema    Anxiety    Complication of anesthesia    woke up during surgery   Depression    Family history of adverse reaction to anesthesia     mom is difficult to put to sleep   IUD (intrauterine device) in place    Knee pain, right    Migraine headache    Sleep paralysis    Ulcerative proctitis (HCC)    Past Surgical History:  Procedure Laterality Date   ANTERIOR CRUCIATE LIGAMENT REPAIR Left 08/10/2014   Procedure: ANTERIOR CRUCIATE LIGAMENT (ACL) REPAIR;  Surgeon: Eugenia Mcalpine, MD;  Location: [facility];  Service: Orthopedics;  Laterality: Left;   HIP SURGERY Right 08/25/2019   PAO surgery [facility]   HIP SURGERY Bilateral 01/05/2021   [facility], had hardware taken out   KNEE ARTHROSCOPY Left 08/10/2014   Procedure: ARTHROSCOPY KNEE DEBRIDEMENT ALLOGRAFT ACL REVISION RECONSTRUCTION;  Surgeon: Eugenia Mcalpine, MD;  Location: [facility];  Service: Orthopedics;  Laterality: Left;   KNEE ARTHROSCOPY W/ ACL RECONSTRUCTION Left 2006   SPINAL FUSION     widom tooth extraction     only had 1 wisdom tooth    Patient Active Problem List   Diagnosis Date Noted   Ulcerative proctitis with rectal bleeding (HCC) 06/16/2020   Bloating 06/16/2020   Chronic migraine without aura without status migrainosus, not intractable 08/19/2018   Migraine with  aura and with status migrainosus, not intractable 08/19/2018   Pain in left shoulder 08/19/2018   Pain in left ankle and joints of left foot 05/09/2018   Pain in left hip 05/09/2018   Cervicalgia 05/09/2018   Chronic rhinitis 11/12/2017   Angioedema 11/12/2017   Allergic reaction 11/12/2017   Chronic sinusitis 11/12/2017   Cervical intraepithelial neoplasia grade 1 10/01/2017   GAD (generalized anxiety disorder) 09/17/2014   ACL graft tear (HCC) 08/10/2014   S/P ACL reconstruction 08/10/2014   Migraine headache 05/15/2010     REFERRING PROVIDER: Lily Kocher, MD  REFERRING DIAG: [id number] (ICD-10-CM) - Status post hip surgery / s/p arthroscopy of hip  S32.691K (ICD-10-CM) - Other specified fracture of right ischium, subsequent encounter for fracture with nonunion  THERAPY DIAG:  Pain in right hip  Muscle weakness (generalized)  Difficulty in walking, not elsewhere classified  Stiffness of right hip, not elsewhere classified  Rationale for Evaluation and Treatment: Rehabilitation  ONSET DATE: DOS  12/27/2022   SUBJECTIVE:   SUBJECTIVE STATEMENT: Pt reports she had scar massage yesterday; reports increased soreness in post thigh and medial groin since then.  She voices frustration of her limited speed, along with pain, when walking dog.     Initial subjective  Pt has a hx of hip surgeries.  She underwent R hip arthroscopy with femoroplasty, labral repair, and pelvis osteotomy on 08/24/2019 (PAO).  Pt then had L hip labral repair and hip bone osteotomy, femoroplasty on 03/21/20.  Pt had hardware removal on 01/05/21 in bilat hips to help promote bone growth which did not help.   Pt underwent Revision R hip femoroplasty, arthroscopy with labral repair, open internal fixation post pelv ring Fx on 12/27/2022.  Pt had an ischial nonunion.  Pt had x rays which showed that the hardware is intact and there is progressive healing of the ischial nonunion.  MD note indicated to  continue to increase activities as tolerated and continue with physical therapy with anticipated start of aquatic physical therapy.  MD sent surgical protocol for arthroscopic hip labral repair with femoral osteoplasty.  Pt states she hurts with everything.  Pt is limited with ambulation distance, standing duration, and stairs due to pain.  Pt unable to bend over.  Pt unable to walk her dog and walk her neighborhood.  Pt has pain with driving.  Pt is very limited with household chores due to pain.  Sitting is affected due to her hip pain.   Pt hasn't performed therapy since the surgery.  Pt states dry needling helped her last time she was in therapy.  Pt wants to perform aquatic therapy.    PERTINENT HISTORY: Hx of hip surgeries: -Revision R hip femoroplasty, arthroscopy with labral repair, open internal fixation post pelv ring Fx on 12/27/2022  -R hip arthroscopy with femoroplasty, labral repair, and pelvis osteotomy on 08/24/2019 (PAO)  -L hip labral repair and hip bone osteotomy, femoroplasty on 03/21/20 -hardware removal on 01/05/21 in bilat hips     L ACL repair x 2 in 06 and 2016 GAD Migraines Ulcerative colitis and celiac Mast cell activation syndrome Cervical fusion in 2020 and 2021   PAIN:  NPRS:  5/10 current, 10/10 worst, 4/10 best Location:  R anterior and posteror hip, hamstring  PRECAUTIONS: Other: per surgical protocol, R hip labral repair, L ACL repair x 2, cervical fusion x 2   WEIGHT BEARING RESTRICTIONS:  none indicated on orders  FALLS:  Has patient fallen in last 6 months? No  LIVING ENVIRONMENT: Lives with: lives with their spouse Lives in:  2 story home but moving into a 1 story Stairs: yes   PLOF: Independent  PATIENT GOALS: to be pain-free, improve ROM, improve quality of life    OBJECTIVE:  Note: Objective measures were completed at Evaluation unless otherwise noted.  DIAGNOSTIC FINDINGS: Pelvis x ray on 04/05/23:    Findings/Impression:  Hardware: Right acetabular plate and screw fixation without complication. Alignment: No dislocation. Bones: No acute fracture. Healing osteotomy of the right superior pubic ramus root. Joints: Mild degenerative changes of the left hip. Soft Tissues: Unremarkable.    PATIENT SURVEYS:  LEFS 41/80  COGNITION: Overall cognitive status: Within functional limits for tasks assessed          Pt states all of her incisions are healed and closed with no scabbing.    PALPATION: TTP:  anterior > lateral hip.  Tender in R glute and along entire HS to post knee   LOWER EXTREMITY ROM:  Active ROM Right eval Left eval  Hip flexion 105 118  Hip extension    Hip abduction 22 30  Hip adduction    Hip internal rotation 42 24  Hip external rotation 24 40  Knee flexion    Knee  extension    Ankle dorsiflexion    Ankle plantarflexion    Ankle inversion    Ankle eversion     (Blank rows = not tested)  LOWER EXTREMITY MMT:  Strength not tested   GAIT: Assistive device utilized: None Level of assistance: Complete Independence Comments: Pt has an antalgic limp.  She favors R LE with gait and appears to have a R hip drop.  7/10 pain with ambulation.                                                                                                                                TREATMENT DATE:   [facility] Adult PT Treatment:                                                DATE: 05/24/23 Pt seen for aquatic therapy today.  Treatment took place in water 3.5-4.75 ft in depth at the [facility] pool. Temp of water was 92.  Pt entered/exited the pool via stairs and step to pattern with hand rail.  *unsupported: walking forward/ backward, cues for vertical trunk and even step length, reciprocal arm swing * side stepping -> side step into wide squat with arm addct/ abdct with yellow hand floats * walking forward/ backward split squats with yellow hand floats under water  at sides  *skipping- forward/ backward  * plank with hands on blue hand floats -> alternating hip ext  *yellow noodle stomp hip into hip/knee flexion x 10 slow then 10 fast unsupported -> hip abdct to 45/knee flexion 10 slow/10 fast with solid noodle * R hamstring stretch with ankle on solid noodle  * R adductor stretch with ankle on solid noodle  * solid noodle under R knee, knee flex to ext    Pt requires the buoyancy and hydrostatic pressure of water for support, and to offload joints by unweighting joint load by at least 50 % in navel deep water and by at least 75-80% in chest to neck deep water.  Viscosity of the water is needed for resistance of strengthening. Water current perturbations provides challenge to standing balance requiring increased core activation.      PATIENT EDUCATION:  Education details: aquatic therapy exercise modifications/ progressions  Person educated: Patient Education method: Explanation, Demonstration, Tactile cues, Verbal cues, Education comprehension: verbalized understanding, returned demonstration, verbal cues required, tactile cues required, and needs further education  HOME EXERCISE PROGRAM: Access Code: URL: [email or web address] Date: 04/30/2023 Prepared by: Aaron Edelman  Exercises - Supine Transversus Abdominis Bracing - Hands on Stomach  - 2 x daily - 7 x weekly - 2 sets - 10 reps - 5 seconds hold - Bent Knee Fallouts  - 1-2 x daily - 7 x weekly - 1-2 sets - 10 reps  ASSESSMENT:  CLINICAL IMPRESSION: Completed  session in lap pool.  Pt reported some tightness in R groin and mid belly of R hamstring. She reported some increase in pain at ischial tuberosity when reaching terminal knee ext when stretching hamstring (ankle on solid noodle); pt encouraged to dial back stretch to point of stretch but less pain.  Pt has partially met her STGs. Therapist to ck remaining STG next visit.  Pt voiced interest in DN / manual  therapy next session.  Therapist to assign land exercises.     Initial Impression Patient is a 38 y.o. female 17 weeks and 5 days s/p Revision R hip femoroplasty, arthroscopy with labral repair, open internal fixation post pelv ring Fx on 12/27/2022.  Pt continues to have constant significant pain.  She is limited with her functional mobility skills and activities.  Pt is limited with ambulation distance, standing duration, and stairs due to pain.  Pt unable to walk her dog and walk her neighborhood.  She has pain with driving and is very limited with household chores due to pain.  Pt hasn't received any therapy since this past surgery.  She should benefit from skilled PT services per protocol including aquatic therapy to address impairments and improve overall function.     OBJECTIVE IMPAIRMENTS: Abnormal gait, decreased activity tolerance, decreased endurance, decreased mobility, difficulty walking, decreased ROM, decreased strength, hypomobility, and pain.   ACTIVITY LIMITATIONS: lifting, bending, sitting, standing, squatting, stairs, transfers, and locomotion level  PARTICIPATION LIMITATIONS: cleaning, laundry, driving, shopping, and community activity  PERSONAL FACTORS: Time since onset of injury/illness/exacerbation and 3+ comorbidities: R hip labral repair, L ACL repair x 2, cervical fusion x 2  are also affecting patient's functional outcome.   REHAB POTENTIAL: Good  CLINICAL DECISION MAKING: Evolving/moderate complexity  EVALUATION COMPLEXITY: Moderate   GOALS:  SHORT TERM GOALS: Target date:  05/28/2023   Pt will be independent with HEP for improved pain, strength, tolerance to activity, and function.  Baseline: Goal status: INITIAL  2.  Pt will tolerate aquatic therapy without adverse effects in order to perform exercises in a reduced stress environment and improve functional mobility, tolerance to activity, strength, and pain.  Baseline:  Goal status: MET -05/24/23 Target  date:  05/21/2023   3.  Pt's worst pain will be no > than 8/10 for improved tolerance to activity and mobility.  Baseline: 5/10 Goal status: MET - 05/24/23  4.  Pt will report at least a 25% improvement in her daily mobility.   Baseline: 20% Goal status: IN PROGRESS - 05/24/23  5.  Pt will demonstrate improved quality of gait including improved Wb'ing thru R LE, reduced hip drop, and reduced limp.  Baseline:  Goal status: INITIAL  6.  Pt will perform a 6 inch step up with good form and stability without significant pain Baseline:  Goal status: MET -05/24/23 Target date:  06/04/2023  7.  Pt will ambulate with a normalized heel to toe gait pattern without limping.   Goal status:  INITIAL  Target date:  06/18/2023    LONG TERM GOALS: Target date: 07/23/2023   Pt will ambulate extended community distance without significant pain and difficulty.  Baseline:  Goal status: INITIAL  2.  Pt will ascend and descend stairs with a reciprocal gait with a rail.  Baseline:  Goal status: INITIAL  3.  Pt will be able to walk her neighborhood without significant pain. Baseline:  Goal status: INITIAL  4.  Pt will be able to perform her ADLs and IADLs including household  chores without significant pain and difficulty.  Baseline:  Goal status: INITIAL  5.  Pt will squat with symmetrical Wb'ing in bilat LE's and good form without increased pain for improved functional strength.  Baseline:  Goal status: INITIAL  6.  Pt will demo 4 to 4+/5 strength in R hip abd, 4/5 in R hip flex, and 5/5 in R knee ext for improved performance of and tolerance with functional mobility.  Baseline:  Goal status: INITIAL   PLAN:  PT FREQUENCY: 2x/week  PT DURATION: 12 weeks  PLANNED INTERVENTIONS: 97164- PT Re-evaluation, 97110-Therapeutic exercises, 97530- Therapeutic activity, 97112- Neuromuscular re-education, 97535- Self Care, 69629- Manual therapy, [phone]- Gait training, [phone]- Aquatic Therapy, [phone]-  Electrical stimulation (unattended), Patient/Family education, Balance training, Stair training, Taping, Dry Needling, Joint mobilization, Spinal mobilization, Scar mobilization, Cryotherapy, and Moist heat  PLAN FOR NEXT SESSION: see above.   Mayer Camel, PTA 05/24/23 4:59 PM [facility] [address] [place], [place], [address] Phone: [phone]   Fax:  [phone]

## 2023-05-27 ENCOUNTER — Ambulatory Visit (HOSPITAL_COMMUNITY): Payer: Self-pay | Admitting: Psychiatry

## 2023-05-28 ENCOUNTER — Ambulatory Visit (HOSPITAL_BASED_OUTPATIENT_CLINIC_OR_DEPARTMENT_OTHER): Attending: Orthopedic Surgery | Admitting: Physical Therapy

## 2023-05-28 ENCOUNTER — Encounter (HOSPITAL_BASED_OUTPATIENT_CLINIC_OR_DEPARTMENT_OTHER): Payer: Self-pay | Admitting: Physical Therapy

## 2023-05-28 DIAGNOSIS — M6281 Muscle weakness (generalized): Secondary | ICD-10-CM | POA: Diagnosis present

## 2023-05-28 DIAGNOSIS — R262 Difficulty in walking, not elsewhere classified: Secondary | ICD-10-CM | POA: Diagnosis present

## 2023-05-28 DIAGNOSIS — M25651 Stiffness of right hip, not elsewhere classified: Secondary | ICD-10-CM | POA: Diagnosis present

## 2023-05-28 DIAGNOSIS — M25551 Pain in right hip: Secondary | ICD-10-CM

## 2023-05-28 NOTE — Therapy (Signed)
 OUTPATIENT PHYSICAL THERAPY LOWER EXTREMITY TREATMENT   Patient Name: [name] MRN: [id number] DOB:1985-10-27, 38 y.o., female Today's Date: 05/28/2023  END OF SESSION:  PT End of Session - 05/28/23 1108     Visit Number 8    Number of Visits 24    Date for PT Re-Evaluation 07/23/23    Authorization Type [employer]    PT Start Time 1108    PT Stop Time 1158    PT Time Calculation (min) 50 min    Activity Tolerance Patient tolerated treatment well    Behavior During Therapy WFL for tasks assessed/performed                Past Medical History:  Diagnosis Date   ACL graft tear (HCC)    left knee   Allergy    Angio-edema    Anxiety    Complication of anesthesia    woke up during surgery   Depression    Family history of adverse reaction to anesthesia     mom is difficult to put to sleep   IUD (intrauterine device) in place    Knee pain, right    Migraine headache    Sleep paralysis    Ulcerative proctitis (HCC)    Past Surgical History:  Procedure Laterality Date   ANTERIOR CRUCIATE LIGAMENT REPAIR Left 08/10/2014   Procedure: ANTERIOR CRUCIATE LIGAMENT (ACL) REPAIR;  Surgeon: Eugenia Mcalpine, MD;  Location: [facility];  Service: Orthopedics;  Laterality: Left;   HIP SURGERY Right 08/25/2019   PAO surgery [facility]   HIP SURGERY Bilateral 01/05/2021   [facility], had hardware taken out   KNEE ARTHROSCOPY Left 08/10/2014   Procedure: ARTHROSCOPY KNEE DEBRIDEMENT ALLOGRAFT ACL REVISION RECONSTRUCTION;  Surgeon: Eugenia Mcalpine, MD;  Location: [facility];  Service: Orthopedics;  Laterality: Left;   KNEE ARTHROSCOPY W/ ACL RECONSTRUCTION Left 2006   SPINAL FUSION     widom tooth extraction     only had 1 wisdom tooth    Patient Active Problem List   Diagnosis Date Noted   Ulcerative proctitis with rectal bleeding (HCC) 06/16/2020   Bloating 06/16/2020   Chronic migraine without aura without status migrainosus, not intractable  08/19/2018   Migraine with aura and with status migrainosus, not intractable 08/19/2018   Pain in left shoulder 08/19/2018   Pain in left ankle and joints of left foot 05/09/2018   Pain in left hip 05/09/2018   Cervicalgia 05/09/2018   Chronic rhinitis 11/12/2017   Angioedema 11/12/2017   Allergic reaction 11/12/2017   Chronic sinusitis 11/12/2017   Cervical intraepithelial neoplasia grade 1 10/01/2017   GAD (generalized anxiety disorder) 09/17/2014   ACL graft tear (HCC) 08/10/2014   S/P ACL reconstruction 08/10/2014   Migraine headache 05/15/2010     REFERRING PROVIDER: Lily Kocher, MD  REFERRING DIAG: [phone] (ICD-10-CM) - Status post hip surgery / s/p arthroscopy of hip  S32.691K (ICD-10-CM) - Other specified fracture of right ischium, subsequent encounter for fracture with nonunion  THERAPY DIAG:  Pain in right hip  Muscle weakness (generalized)  Difficulty in walking, not elsewhere classified  Stiffness of right hip, not elsewhere classified  Rationale for Evaluation and Treatment: Rehabilitation  ONSET DATE: DOS  12/27/2022   SUBJECTIVE:   SUBJECTIVE STATEMENT: Rt anterior tightness and Rt HS into glut pain.     Initial subjective Pt has a hx of hip surgeries.  She underwent R hip arthroscopy with femoroplasty, labral repair, and pelvis osteotomy on  08/24/2019 (PAO).  Pt then had L hip labral repair and hip bone osteotomy, femoroplasty on 03/21/20.  Pt had hardware removal on 01/05/21 in bilat hips to help promote bone growth which did not help.   Pt underwent Revision R hip femoroplasty, arthroscopy with labral repair, open internal fixation post pelv ring Fx on 12/27/2022.  Pt had an ischial nonunion.  Pt had x rays which showed that the hardware is intact and there is progressive healing of the ischial nonunion.  MD note indicated to continue to increase activities as tolerated and continue with physical therapy with anticipated start of aquatic physical  therapy.  MD sent surgical protocol for arthroscopic hip labral repair with femoral osteoplasty.  Pt states she hurts with everything.  Pt is limited with ambulation distance, standing duration, and stairs due to pain.  Pt unable to bend over.  Pt unable to walk her dog and walk her neighborhood.  Pt has pain with driving.  Pt is very limited with household chores due to pain.  Sitting is affected due to her hip pain.   Pt hasn't performed therapy since the surgery.  Pt states dry needling helped her last time she was in therapy.  Pt wants to perform aquatic therapy.    PERTINENT HISTORY: Hx of hip surgeries: -Revision R hip femoroplasty, arthroscopy with labral repair, open internal fixation post pelv ring Fx on 12/27/2022  -R hip arthroscopy with femoroplasty, labral repair, and pelvis osteotomy on 08/24/2019 (PAO)  -L hip labral repair and hip bone osteotomy, femoroplasty on 03/21/20 -hardware removal on 01/05/21 in bilat hips     L ACL repair x 2 in 06 and 2016 GAD Migraines Ulcerative colitis and celiac Mast cell activation syndrome Cervical fusion in 2020 and 2021   PAIN:  NPRS:  5/10 current, 10/10 worst, 4/10 best Location:  R anterior and posteror hip, hamstring  PRECAUTIONS: Other: per surgical protocol, R hip labral repair, L ACL repair x 2, cervical fusion x 2   WEIGHT BEARING RESTRICTIONS:  none indicated on orders  FALLS:  Has patient fallen in last 6 months? No  LIVING ENVIRONMENT: Lives with: lives with their spouse Lives in:  2 story home but moving into a 1 story Stairs: yes   PLOF: Independent  PATIENT GOALS: to be pain-free, improve ROM, improve quality of life    OBJECTIVE:  Note: Objective measures were completed at Evaluation unless otherwise noted.  DIAGNOSTIC FINDINGS: Pelvis x ray on 04/05/23:   Findings/Impression:  Hardware: Right acetabular plate and screw fixation without complication. Alignment: No dislocation. Bones: No acute  fracture. Healing osteotomy of the right superior pubic ramus root. Joints: Mild degenerative changes of the left hip. Soft Tissues: Unremarkable.    PATIENT SURVEYS:  LEFS 41/80  COGNITION: Overall cognitive status: Within functional limits for tasks assessed          Pt states all of her incisions are healed and closed with no scabbing.    PALPATION: TTP:  anterior > lateral hip.  Tender in R glute and along entire HS to post knee   LOWER EXTREMITY ROM:  Active ROM Right eval Left eval  Hip flexion 105 118  Hip extension    Hip abduction 22 30  Hip adduction    Hip internal rotation 42 24  Hip external rotation 24 40  Knee flexion    Knee extension    Ankle dorsiflexion    Ankle plantarflexion    Ankle inversion    Ankle eversion     (  Blank rows = not tested)  LOWER EXTREMITY MMT:  Strength not tested   GAIT: Assistive device utilized: None Level of assistance: Complete Independence Comments: Pt has an antalgic limp.  She favors R LE with gait and appears to have a R hip drop.  7/10 pain with ambulation.                                                                                                                                TREATMENT DATE:   Treatment                            4/1: Blank lines following charge title = not provided on this treatment date.   Manual:  TPDN YES  Trigger Point Dry Needling  Initial Treatment: Pt instructed on Dry Needling rational, procedures, and possible side effects. Pt instructed to expect mild to moderate muscle soreness later in the day and/or into the next day.  Pt instructed in methods to reduce muscle soreness. Pt instructed to continue prescribed HEP. Patient verbalized understanding of these instructions and education.   Had DN in the past at [facility].   Patient Verbal Consent Given: Yes Education Handout Provided: No in pt instructions Muscles Treated: bilateral glut max & med, Rt piriformis, Rt  TFL Electrical Stimulation Performed: No Treatment Response/Outcome: twitch with decreased spasm Post rotation spring mob to Lt innominate There-ex: Hesch self correction- Rt post innom & Lt anterior There-Act:  Self Care:  Nuro-Re-ed: Standing postural alignment- weight to heels and glut set Gait Training:        PATIENT EDUCATION:  Education details: aquatic therapy exercise modifications/ progressions  Person educated: Patient Education method: Explanation, Demonstration, Tactile cues, Verbal cues, Education comprehension: verbalized understanding, returned demonstration, verbal cues required, tactile cues required, and needs further education  HOME EXERCISE PROGRAM: Access Code: URL: [email or web address] Date: 04/30/2023 Prepared by: Aaron Edelman  Exercises - Supine Transversus Abdominis Bracing - Hands on Stomach  - 2 x daily - 7 x weekly - 2 sets - 10 reps - 5 seconds hold - Bent Knee Fallouts  - 1-2 x daily - 7 x weekly - 1-2 sets - 10 reps  ASSESSMENT:  CLINICAL IMPRESSION: functional LLD (Rt shorter) that was corrected with treatment today- will focus on treatment of Lt anterior innominate as it is stuck and Rt is more painful. S/s consistent with Rt hip flexor strain and hamstring spasm due to post Rt innominate rotation. We took time to discuss the need to retrain posture/habits for support of strengthening and how DN is used as a tool embedded into treatment vs having it as a separate appointment.     Initial Impression Patient is a 38 y.o. female 17 weeks and 5 days s/p Revision R hip femoroplasty, arthroscopy with labral repair, open internal fixation post pelv ring Fx on 12/27/2022.  Pt continues to have constant significant  pain.  She is limited with her functional mobility skills and activities.  Pt is limited with ambulation distance, standing duration, and stairs due to pain.  Pt unable to walk her dog and walk her neighborhood.   She has pain with driving and is very limited with household chores due to pain.  Pt hasn't received any therapy since this past surgery.  She should benefit from skilled PT services per protocol including aquatic therapy to address impairments and improve overall function.     OBJECTIVE IMPAIRMENTS: Abnormal gait, decreased activity tolerance, decreased endurance, decreased mobility, difficulty walking, decreased ROM, decreased strength, hypomobility, and pain.   ACTIVITY LIMITATIONS: lifting, bending, sitting, standing, squatting, stairs, transfers, and locomotion level  PARTICIPATION LIMITATIONS: cleaning, laundry, driving, shopping, and community activity  PERSONAL FACTORS: Time since onset of injury/illness/exacerbation and 3+ comorbidities: R hip labral repair, L ACL repair x 2, cervical fusion x 2  are also affecting patient's functional outcome.   REHAB POTENTIAL: Good  CLINICAL DECISION MAKING: Evolving/moderate complexity  EVALUATION COMPLEXITY: Moderate   GOALS:  SHORT TERM GOALS: Target date:  05/28/2023   Pt will be independent with HEP for improved pain, strength, tolerance to activity, and function.  Baseline: Goal status: INITIAL  2.  Pt will tolerate aquatic therapy without adverse effects in order to perform exercises in a reduced stress environment and improve functional mobility, tolerance to activity, strength, and pain.  Baseline:  Goal status: MET -05/24/23 Target date:  05/21/2023   3.  Pt's worst pain will be no > than 8/10 for improved tolerance to activity and mobility.  Baseline: 5/10 Goal status: MET - 05/24/23  4.  Pt will report at least a 25% improvement in her daily mobility.   Baseline: 20% Goal status: IN PROGRESS - 05/24/23  5.  Pt will demonstrate improved quality of gait including improved Wb'ing thru R LE, reduced hip drop, and reduced limp.  Baseline:  Goal status: INITIAL  6.  Pt will perform a 6 inch step up with good form and stability  without significant pain Baseline:  Goal status: MET -05/24/23 Target date:  06/04/2023  7.  Pt will ambulate with a normalized heel to toe gait pattern without limping.   Goal status:  INITIAL  Target date:  06/18/2023    LONG TERM GOALS: Target date: 07/23/2023   Pt will ambulate extended community distance without significant pain and difficulty.  Baseline:  Goal status: INITIAL  2.  Pt will ascend and descend stairs with a reciprocal gait with a rail.  Baseline:  Goal status: INITIAL  3.  Pt will be able to walk her neighborhood without significant pain. Baseline:  Goal status: INITIAL  4.  Pt will be able to perform her ADLs and IADLs including household chores without significant pain and difficulty.  Baseline:  Goal status: INITIAL  5.  Pt will squat with symmetrical Wb'ing in bilat LE's and good form without increased pain for improved functional strength.  Baseline:  Goal status: INITIAL  6.  Pt will demo 4 to 4+/5 strength in R hip abd, 4/5 in R hip flex, and 5/5 in R knee ext for improved performance of and tolerance with functional mobility.  Baseline:  Goal status: INITIAL   PLAN:  PT FREQUENCY: 2x/week  PT DURATION: 12 weeks  PLANNED INTERVENTIONS: 97164- PT Re-evaluation, 97110-Therapeutic exercises, 97530- Therapeutic activity, [id number]- Neuromuscular re-education, 97535- Self Care, [id number]- Manual therapy, [phone]- Gait training, [phone]- Aquatic Therapy, [phone]- Electrical stimulation (unattended), Patient/Family education, Balance  training, Stair training, Taping, Dry Needling, Joint mobilization, Spinal mobilization, Scar mobilization, Cryotherapy, and Moist heat  PLAN FOR NEXT SESSION: see above.   Sahan Pen C. Trinten Boudoin PT, DPT 05/28/23 1:26 PM  [facility] [address] [place], [place], [address] Phone: [phone]   Fax:  [phone]

## 2023-05-30 ENCOUNTER — Ambulatory Visit (HOSPITAL_BASED_OUTPATIENT_CLINIC_OR_DEPARTMENT_OTHER): Admitting: Physical Therapy

## 2023-05-30 DIAGNOSIS — R262 Difficulty in walking, not elsewhere classified: Secondary | ICD-10-CM

## 2023-05-30 DIAGNOSIS — M25651 Stiffness of right hip, not elsewhere classified: Secondary | ICD-10-CM

## 2023-05-30 DIAGNOSIS — M25551 Pain in right hip: Secondary | ICD-10-CM | POA: Diagnosis not present

## 2023-05-30 DIAGNOSIS — M6281 Muscle weakness (generalized): Secondary | ICD-10-CM

## 2023-05-30 NOTE — Therapy (Signed)
 OUTPATIENT PHYSICAL THERAPY LOWER EXTREMITY TREATMENT   Patient Name: [name] MRN: [id number] DOB:1985-10-07, 38 y.o., female Today's Date: 05/30/2023  END OF SESSION:  PT End of Session - 05/30/23 1559     Visit Number 9    Number of Visits 24    Date for PT Re-Evaluation 07/23/23    Authorization Type [employer]    PT Start Time 1536   pt arrived late   PT Stop Time 1614    PT Time Calculation (min) 38 min    Behavior During Therapy WFL for tasks assessed/performed                Past Medical History:  Diagnosis Date   ACL graft tear (HCC)    left knee   Allergy    Angio-edema    Anxiety    Complication of anesthesia    woke up during surgery   Depression    Family history of adverse reaction to anesthesia     mom is difficult to put to sleep   IUD (intrauterine device) in place    Knee pain, right    Migraine headache    Sleep paralysis    Ulcerative proctitis (HCC)    Past Surgical History:  Procedure Laterality Date   ANTERIOR CRUCIATE LIGAMENT REPAIR Left 08/10/2014   Procedure: ANTERIOR CRUCIATE LIGAMENT (ACL) REPAIR;  Surgeon: Eugenia Mcalpine, MD;  Location: [facility];  Service: Orthopedics;  Laterality: Left;   HIP SURGERY Right 08/25/2019   PAO surgery [facility]   HIP SURGERY Bilateral 01/05/2021   [facility], had hardware taken out   KNEE ARTHROSCOPY Left 08/10/2014   Procedure: ARTHROSCOPY KNEE DEBRIDEMENT ALLOGRAFT ACL REVISION RECONSTRUCTION;  Surgeon: Eugenia Mcalpine, MD;  Location: [facility];  Service: Orthopedics;  Laterality: Left;   KNEE ARTHROSCOPY W/ ACL RECONSTRUCTION Left 2006   SPINAL FUSION     widom tooth extraction     only had 1 wisdom tooth    Patient Active Problem List   Diagnosis Date Noted   Ulcerative proctitis with rectal bleeding (HCC) 06/16/2020   Bloating 06/16/2020   Chronic migraine without aura without status migrainosus, not intractable 08/19/2018   Migraine with aura and with  status migrainosus, not intractable 08/19/2018   Pain in left shoulder 08/19/2018   Pain in left ankle and joints of left foot 05/09/2018   Pain in left hip 05/09/2018   Cervicalgia 05/09/2018   Chronic rhinitis 11/12/2017   Angioedema 11/12/2017   Allergic reaction 11/12/2017   Chronic sinusitis 11/12/2017   Cervical intraepithelial neoplasia grade 1 10/01/2017   GAD (generalized anxiety disorder) 09/17/2014   ACL graft tear (HCC) 08/10/2014   S/P ACL reconstruction 08/10/2014   Migraine headache 05/15/2010     REFERRING PROVIDER: Lily Kocher, MD  REFERRING DIAG: [phone] (ICD-10-CM) - Status post hip surgery / s/p arthroscopy of hip  S32.691K (ICD-10-CM) - Other specified fracture of right ischium, subsequent encounter for fracture with nonunion  THERAPY DIAG:  Pain in right hip  Muscle weakness (generalized)  Difficulty in walking, not elsewhere classified  Stiffness of right hip, not elsewhere classified  Rationale for Evaluation and Treatment: Rehabilitation  ONSET DATE: DOS  12/27/2022   SUBJECTIVE:   SUBJECTIVE STATEMENT: "Just a smidge better.  I can squeeze my glutes with a little less pain".     Initial subjective Pt has a hx of hip surgeries.  She underwent R hip arthroscopy with femoroplasty, labral repair, and pelvis osteotomy  on 08/24/2019 (PAO).  Pt then had L hip labral repair and hip bone osteotomy, femoroplasty on 03/21/20.  Pt had hardware removal on 01/05/21 in bilat hips to help promote bone growth which did not help.   Pt underwent Revision R hip femoroplasty, arthroscopy with labral repair, open internal fixation post pelv ring Fx on 12/27/2022.  Pt had an ischial nonunion.  Pt had x rays which showed that the hardware is intact and there is progressive healing of the ischial nonunion.  MD note indicated to continue to increase activities as tolerated and continue with physical therapy with anticipated start of aquatic physical therapy.  MD  sent surgical protocol for arthroscopic hip labral repair with femoral osteoplasty.  Pt states she hurts with everything.  Pt is limited with ambulation distance, standing duration, and stairs due to pain.  Pt unable to bend over.  Pt unable to walk her dog and walk her neighborhood.  Pt has pain with driving.  Pt is very limited with household chores due to pain.  Sitting is affected due to her hip pain.   Pt hasn't performed therapy since the surgery.  Pt states dry needling helped her last time she was in therapy.  Pt wants to perform aquatic therapy.    PERTINENT HISTORY: Hx of hip surgeries: -Revision R hip femoroplasty, arthroscopy with labral repair, open internal fixation post pelv ring Fx on 12/27/2022  -R hip arthroscopy with femoroplasty, labral repair, and pelvis osteotomy on 08/24/2019 (PAO)  -L hip labral repair and hip bone osteotomy, femoroplasty on 03/21/20 -hardware removal on 01/05/21 in bilat hips     L ACL repair x 2 in 06 and 2016 GAD Migraines Ulcerative colitis and celiac Mast cell activation syndrome Cervical fusion in 2020 and 2021   PAIN: Are you having any pain?  Yes  NPRS:  5/10 current, 10/10 worst, 4/10 best Location:  R anterior and posteror hip, Rhamstring  PRECAUTIONS: Other: per surgical protocol, R hip labral repair, L ACL repair x 2, cervical fusion x 2   WEIGHT BEARING RESTRICTIONS:  none indicated on orders  FALLS:  Has patient fallen in last 6 months? No  LIVING ENVIRONMENT: Lives with: lives with their spouse Lives in:  2 story home but moving into a 1 story Stairs: yes   PLOF: Independent  PATIENT GOALS: to be pain-free, improve ROM, improve quality of life    OBJECTIVE:  Note: Objective measures were completed at Evaluation unless otherwise noted.  DIAGNOSTIC FINDINGS: Pelvis x ray on 04/05/23:   Findings/Impression:  Hardware: Right acetabular plate and screw fixation without complication. Alignment: No dislocation. Bones:  No acute fracture. Healing osteotomy of the right superior pubic ramus root. Joints: Mild degenerative changes of the left hip. Soft Tissues: Unremarkable.    PATIENT SURVEYS:  LEFS 41/80  COGNITION: Overall cognitive status: Within functional limits for tasks assessed          Pt states all of her incisions are healed and closed with no scabbing.    PALPATION: TTP:  anterior > lateral hip.  Tender in R glute and along entire HS to post knee   LOWER EXTREMITY ROM:  Active ROM Right eval Left eval  Hip flexion 105 118  Hip extension    Hip abduction 22 30  Hip adduction    Hip internal rotation 42 24  Hip external rotation 24 40  Knee flexion    Knee extension    Ankle dorsiflexion    Ankle plantarflexion  Ankle inversion    Ankle eversion     (Blank rows = not tested)  LOWER EXTREMITY MMT:  Strength not tested   GAIT: Assistive device utilized: None Level of assistance: Complete Independence Comments: Pt has an antalgic limp.  She favors R LE with gait and appears to have a R hip drop.  7/10 pain with ambulation.                                                                                                                                TREATMENT DATE:   [facility] Adult PT Treatment:                                                DATE: 05/30/23  Therapeutic Exercise: Hesch self correction- Rt post innom & Lt anterior Bridge with ball squeeze x 5 Manual Therapy: IASTM/ STM to R hamstring, adductor.  TPR to quadratus femoris Aquatic therapy:  * pendulums - side to side and front to/from back ~3-5 each * straddling noodle: cycling (with even pull in LEs) and hip abdct/ addct (not tolerated in R groin) * plank on yellow noodle with alternating fire hydrants LEs   Treatment                            4/1: Blank lines following charge title = not provided on this treatment date.   Manual:  TPDN YES  Trigger Point Dry Needling  Initial Treatment: Pt  instructed on Dry Needling rational, procedures, and possible side effects. Pt instructed to expect mild to moderate muscle soreness later in the day and/or into the next day.  Pt instructed in methods to reduce muscle soreness. Pt instructed to continue prescribed HEP. Patient verbalized understanding of these instructions and education.   Had DN in the past at [facility].   Patient Verbal Consent Given: Yes Education Handout Provided: No in pt instructions Muscles Treated: bilateral glut max & med, Rt piriformis, Rt TFL Electrical Stimulation Performed: No Treatment Response/Outcome: twitch with decreased spasm Post rotation spring mob to Lt innominate There-ex: Hesch self correction- Rt post innom & Lt anterior There-Act:  Self Care:  Nuro-Re-ed: Standing postural alignment- weight to heels and glut set Gait Training:        PATIENT EDUCATION:  Education details: aquatic therapy exercise modifications/ progressions  Person educated: Patient Education method: Explanation, Demonstration, Tactile cues, Verbal cues, Education comprehension: verbalized understanding, returned demonstration, verbal cues required, tactile cues required, and needs further education  HOME EXERCISE PROGRAM: Access Code: URL: [email or web address] Date: 04/30/2023 Prepared by: Aaron Edelman  Exercises - Supine Transversus Abdominis Bracing - Hands on Stomach  - 2 x daily - 7 x weekly - 2 sets - 10 reps - 5 seconds hold -  Bent Knee Fallouts  - 1-2 x daily - 7 x weekly - 1-2 sets - 10 reps  ASSESSMENT:  CLINICAL IMPRESSION: Continued functional LLD (Rt shorter) that was corrected with treatment today.  Focus in pool on core stability and avoiding sagittal motion with LE in WB in pool.  Good tolerance for exercises completed today.     Initial Impression Patient is a 38 y.o. female 17 weeks and 5 days s/p Revision R hip femoroplasty, arthroscopy with labral repair, open  internal fixation post pelv ring Fx on 12/27/2022.  Pt continues to have constant significant pain.  She is limited with her functional mobility skills and activities.  Pt is limited with ambulation distance, standing duration, and stairs due to pain.  Pt unable to walk her dog and walk her neighborhood.  She has pain with driving and is very limited with household chores due to pain.  Pt hasn't received any therapy since this past surgery.  She should benefit from skilled PT services per protocol including aquatic therapy to address impairments and improve overall function.     OBJECTIVE IMPAIRMENTS: Abnormal gait, decreased activity tolerance, decreased endurance, decreased mobility, difficulty walking, decreased ROM, decreased strength, hypomobility, and pain.   ACTIVITY LIMITATIONS: lifting, bending, sitting, standing, squatting, stairs, transfers, and locomotion level  PARTICIPATION LIMITATIONS: cleaning, laundry, driving, shopping, and community activity  PERSONAL FACTORS: Time since onset of injury/illness/exacerbation and 3+ comorbidities: R hip labral repair, L ACL repair x 2, cervical fusion x 2  are also affecting patient's functional outcome.   REHAB POTENTIAL: Good  CLINICAL DECISION MAKING: Evolving/moderate complexity  EVALUATION COMPLEXITY: Moderate   GOALS:  SHORT TERM GOALS: Target date:  05/28/2023   Pt will be independent with HEP for improved pain, strength, tolerance to activity, and function.  Baseline: Goal status: INITIAL  2.  Pt will tolerate aquatic therapy without adverse effects in order to perform exercises in a reduced stress environment and improve functional mobility, tolerance to activity, strength, and pain.  Baseline:  Goal status: MET -05/24/23 Target date:  05/21/2023   3.  Pt's worst pain will be no > than 8/10 for improved tolerance to activity and mobility.  Baseline: 5/10 Goal status: MET - 05/24/23  4.  Pt will report at least a 25% improvement  in her daily mobility.   Baseline: 20% Goal status: IN PROGRESS - 05/24/23  5.  Pt will demonstrate improved quality of gait including improved Wb'ing thru R LE, reduced hip drop, and reduced limp.  Baseline:  Goal status: INITIAL  6.  Pt will perform a 6 inch step up with good form and stability without significant pain Baseline:  Goal status: MET -05/24/23 Target date:  06/04/2023  7.  Pt will ambulate with a normalized heel to toe gait pattern without limping.   Goal status:  INITIAL  Target date:  06/18/2023    LONG TERM GOALS: Target date: 07/23/2023   Pt will ambulate extended community distance without significant pain and difficulty.  Baseline:  Goal status: INITIAL  2.  Pt will ascend and descend stairs with a reciprocal gait with a rail.  Baseline:  Goal status: INITIAL  3.  Pt will be able to walk her neighborhood without significant pain. Baseline:  Goal status: INITIAL  4.  Pt will be able to perform her ADLs and IADLs including household chores without significant pain and difficulty.  Baseline:  Goal status: INITIAL  5.  Pt will squat with symmetrical Wb'ing in bilat LE's and  good form without increased pain for improved functional strength.  Baseline:  Goal status: INITIAL  6.  Pt will demo 4 to 4+/5 strength in R hip abd, 4/5 in R hip flex, and 5/5 in R knee ext for improved performance of and tolerance with functional mobility.  Baseline:  Goal status: INITIAL   PLAN:  PT FREQUENCY: 2x/week  PT DURATION: 12 weeks  PLANNED INTERVENTIONS: 97164- PT Re-evaluation, 97110-Therapeutic exercises, 97530- Therapeutic activity, 97112- Neuromuscular re-education, 97535- Self Care, 16109- Manual therapy, [phone]- Gait training, [phone]- Aquatic Therapy, [phone]- Electrical stimulation (unattended), Patient/Family education, Balance training, Stair training, Taping, Dry Needling, Joint mobilization, Spinal mobilization, Scar mobilization, Cryotherapy, and Moist  heat  PLAN FOR NEXT SESSION: see above.    Mayer Camel, PTA 05/30/23 6:03 PM [facility] [address] [place], [place], [address] Phone: [phone]   Fax:  [phone]

## 2023-06-04 ENCOUNTER — Encounter (HOSPITAL_BASED_OUTPATIENT_CLINIC_OR_DEPARTMENT_OTHER): Payer: Self-pay | Admitting: Physical Therapy

## 2023-06-04 ENCOUNTER — Ambulatory Visit (HOSPITAL_BASED_OUTPATIENT_CLINIC_OR_DEPARTMENT_OTHER): Admitting: Physical Therapy

## 2023-06-04 DIAGNOSIS — M25551 Pain in right hip: Secondary | ICD-10-CM

## 2023-06-04 DIAGNOSIS — M6281 Muscle weakness (generalized): Secondary | ICD-10-CM

## 2023-06-04 DIAGNOSIS — M25651 Stiffness of right hip, not elsewhere classified: Secondary | ICD-10-CM

## 2023-06-04 DIAGNOSIS — R262 Difficulty in walking, not elsewhere classified: Secondary | ICD-10-CM

## 2023-06-04 NOTE — Therapy (Signed)
 OUTPATIENT PHYSICAL THERAPY LOWER EXTREMITY TREATMENT   Patient Name: [name] MRN: [id number] DOB:[birth year], 38 y.o., female Today's Date: 06/04/2023  END OF SESSION:  PT End of Session - 06/04/23 1246     Visit Number 10    Number of Visits 24    Date for PT Re-Evaluation 07/23/23    Authorization Type [employer]    PT Start Time 1149    PT Stop Time 1240    PT Time Calculation (min) 51 min    Activity Tolerance Patient tolerated treatment well    Behavior During Therapy WFL for tasks assessed/performed                 Past Medical History:  Diagnosis Date   ACL graft tear (HCC)    left knee   Allergy    Angio-edema    Anxiety    Complication of anesthesia    woke up during surgery   Depression    Family history of adverse reaction to anesthesia     mom is difficult to put to sleep   IUD (intrauterine device) in place    Knee pain, right    Migraine headache    Sleep paralysis    Ulcerative proctitis (HCC)    Past Surgical History:  Procedure Laterality Date   ANTERIOR CRUCIATE LIGAMENT REPAIR Left 08/10/2014   Procedure: ANTERIOR CRUCIATE LIGAMENT (ACL) REPAIR;  Surgeon: Eugenia Mcalpine, MD;  Location: [facility];  Service: Orthopedics;  Laterality: Left;   HIP SURGERY Right 08/25/2019   PAO surgery [facility]   HIP SURGERY Bilateral 01/05/2021   [facility], had hardware taken out   KNEE ARTHROSCOPY Left 08/10/2014   Procedure: ARTHROSCOPY KNEE DEBRIDEMENT ALLOGRAFT ACL REVISION RECONSTRUCTION;  Surgeon: Eugenia Mcalpine, MD;  Location: [facility];  Service: Orthopedics;  Laterality: Left;   KNEE ARTHROSCOPY W/ ACL RECONSTRUCTION Left 2006   SPINAL FUSION     widom tooth extraction     only had 1 wisdom tooth    Patient Active Problem List   Diagnosis Date Noted   Ulcerative proctitis with rectal bleeding (HCC) 06/16/2020   Bloating 06/16/2020   Chronic migraine without aura without status migrainosus, not intractable  08/19/2018   Migraine with aura and with status migrainosus, not intractable 08/19/2018   Pain in left shoulder 08/19/2018   Pain in left ankle and joints of left foot 05/09/2018   Pain in left hip 05/09/2018   Cervicalgia 05/09/2018   Chronic rhinitis 11/12/2017   Angioedema 11/12/2017   Allergic reaction 11/12/2017   Chronic sinusitis 11/12/2017   Cervical intraepithelial neoplasia grade 1 10/01/2017   GAD (generalized anxiety disorder) 09/17/2014   ACL graft tear (HCC) 08/10/2014   S/P ACL reconstruction 08/10/2014   Migraine headache 05/15/2010     REFERRING PROVIDER: Lily Kocher, MD  REFERRING DIAG: [phone] (ICD-10-CM) - Status post hip surgery / s/p arthroscopy of hip  S32.691K (ICD-10-CM) - Other specified fracture of right ischium, subsequent encounter for fracture with nonunion  THERAPY DIAG:  Pain in right hip  Muscle weakness (generalized)  Difficulty in walking, not elsewhere classified  Stiffness of right hip, not elsewhere classified  Rationale for Evaluation and Treatment: Rehabilitation  ONSET DATE: DOS  12/27/2022   SUBJECTIVE:   SUBJECTIVE STATEMENT: Maybe a smidge less pulling in the hamstrings.     Initial subjective Pt has a hx of hip surgeries.  She underwent R hip arthroscopy with femoroplasty, labral repair, and pelvis osteotomy on  08/24/2019 (PAO).  Pt then had L hip labral repair and hip bone osteotomy, femoroplasty on 03/21/20.  Pt had hardware removal on 01/05/21 in bilat hips to help promote bone growth which did not help.   Pt underwent Revision R hip femoroplasty, arthroscopy with labral repair, open internal fixation post pelv ring Fx on 12/27/2022.  Pt had an ischial nonunion.  Pt had x rays which showed that the hardware is intact and there is progressive healing of the ischial nonunion.  MD note indicated to continue to increase activities as tolerated and continue with physical therapy with anticipated start of aquatic physical  therapy.  MD sent surgical protocol for arthroscopic hip labral repair with femoral osteoplasty.  Pt states she hurts with everything.  Pt is limited with ambulation distance, standing duration, and stairs due to pain.  Pt unable to bend over.  Pt unable to walk her dog and walk her neighborhood.  Pt has pain with driving.  Pt is very limited with household chores due to pain.  Sitting is affected due to her hip pain.   Pt hasn't performed therapy since the surgery.  Pt states dry needling helped her last time she was in therapy.  Pt wants to perform aquatic therapy.    PERTINENT HISTORY: Hx of hip surgeries: -Revision R hip femoroplasty, arthroscopy with labral repair, open internal fixation post pelv ring Fx on 12/27/2022  -R hip arthroscopy with femoroplasty, labral repair, and pelvis osteotomy on 08/24/2019 (PAO)  -L hip labral repair and hip bone osteotomy, femoroplasty on 03/21/20 -hardware removal on 01/05/21 in bilat hips     L ACL repair x 2 in 06 and 2016 GAD Migraines Ulcerative colitis and celiac Mast cell activation syndrome Cervical fusion in 2020 and 2021   PAIN: Are you having any pain?  Yes  NPRS:  5/10 current, 10/10 worst, 4/10 best Location:  R anterior and posteror hip, Rhamstring  PRECAUTIONS: Other: per surgical protocol, R hip labral repair, L ACL repair x 2, cervical fusion x 2   WEIGHT BEARING RESTRICTIONS:  none indicated on orders  FALLS:  Has patient fallen in last 6 months? No  LIVING ENVIRONMENT: Lives with: lives with their spouse Lives in:  2 story home but moving into a 1 story Stairs: yes   PLOF: Independent  PATIENT GOALS: to be pain-free, improve ROM, improve quality of life    OBJECTIVE:  Note: Objective measures were completed at Evaluation unless otherwise noted.  DIAGNOSTIC FINDINGS: Pelvis x ray on 04/05/23:   Findings/Impression:  Hardware: Right acetabular plate and screw fixation without complication. Alignment: No  dislocation. Bones: No acute fracture. Healing osteotomy of the right superior pubic ramus root. Joints: Mild degenerative changes of the left hip. Soft Tissues: Unremarkable.    PATIENT SURVEYS:  LEFS 41/80  COGNITION: Overall cognitive status: Within functional limits for tasks assessed          Pt states all of her incisions are healed and closed with no scabbing.    PALPATION: TTP:  anterior > lateral hip.  Tender in R glute and along entire HS to post knee   LOWER EXTREMITY ROM:  Active ROM Right eval Left eval  Hip flexion 105 118  Hip extension    Hip abduction 22 30  Hip adduction    Hip internal rotation 42 24  Hip external rotation 24 40  Knee flexion    Knee extension    Ankle dorsiflexion    Ankle plantarflexion  Ankle inversion    Ankle eversion     (Blank rows = not tested)  LOWER EXTREMITY MMT:  Strength not tested   GAIT: Assistive device utilized: None Level of assistance: Complete Independence Comments: Pt has an antalgic limp.  She favors R LE with gait and appears to have a R hip drop.  7/10 pain with ambulation.                                                                                                                                TREATMENT DATE:   Treatment                            06/04/23: Blank lines following charge title = not provided on this treatment date.   Manual:  TPDN YES  Trigger Point Dry Needling  Subsequent Treatment: Instructions provided previously at initial dry needling treatment.   Patient Verbal Consent Given: Yes Education Handout Provided: Previously Provided Muscles Treated: Rt glut med/max & TFL; Lt thoracic paraspinals T5-8; Lt rhomboids, subscap Electrical Stimulation Performed: No Treatment Response/Outcome: twitch with decr muscle spasm Post rot of Lt innom IASTM incision on right hip prone rib mobs There-ex: Hooklying add with ab set Supine active hamstring stretch with iso hip flexion  from 90/90 There-Act:  Self Care:  Nuro-Re-ed: Standing posture- shifting to Left heel for Lt glut engagement Gait Training:    [facility] Adult PT Treatment:                                                DATE: 05/30/23  Therapeutic Exercise: Hesch self correction- Rt post innom & Lt anterior Bridge with ball squeeze x 5 Manual Therapy: IASTM/ STM to R hamstring, adductor.  TPR to quadratus femoris Aquatic therapy:  * pendulums - side to side and front to/from back ~3-5 each * straddling noodle: cycling (with even pull in LEs) and hip abdct/ addct (not tolerated in R groin) * plank on yellow noodle with alternating fire hydrants LEs   Treatment                            4/1: Blank lines following charge title = not provided on this treatment date.   Manual:  TPDN YES  Trigger Point Dry Needling  Initial Treatment: Pt instructed on Dry Needling rational, procedures, and possible side effects. Pt instructed to expect mild to moderate muscle soreness later in the day and/or into the next day.  Pt instructed in methods to reduce muscle soreness. Pt instructed to continue prescribed HEP. Patient verbalized understanding of these instructions and education.   Had DN in the past at [facility].   Patient Verbal Consent Given: Yes Education Handout Provided:  No in pt instructions Muscles Treated: bilateral glut max & med, Rt piriformis, Rt TFL Electrical Stimulation Performed: No Treatment Response/Outcome: twitch with decreased spasm Post rotation spring mob to Lt innominate There-ex: Hesch self correction- Rt post innom & Lt anterior C sit with lat pull down There-Act:  Self Care:  Nuro-Re-ed: Standing postural alignment- weight to heels and glut set Gait Training:        PATIENT EDUCATION:  Education details: aquatic therapy exercise modifications/ progressions  Person educated: Patient Education method: Explanation, Demonstration, Tactile cues, Verbal  cues, Education comprehension: verbalized understanding, returned demonstration, verbal cues required, tactile cues required, and needs further education  HOME EXERCISE PROGRAM: Access Code: URL: [email or web address] Date: 04/30/2023 Prepared by: Aaron Edelman  Exercises - Supine Transversus Abdominis Bracing - Hands on Stomach  - 2 x daily - 7 x weekly - 2 sets - 10 reps - 5 seconds hold - Bent Knee Fallouts  - 1-2 x daily - 7 x weekly - 1-2 sets - 10 reps  ASSESSMENT:  CLINICAL IMPRESSION: Continued post Rt rotation notable but easily corrected today rather than Lt post rotation feeling blocked. Tendency to shift weight to anterior right foot which is feeding biomechanical chain up to left shoulder- will focus on shifting weight to left heel.    Initial Impression Patient is a 38 y.o. female 17 weeks and 5 days s/p Revision R hip femoroplasty, arthroscopy with labral repair, open internal fixation post pelv ring Fx on 12/27/2022.  Pt continues to have constant significant pain.  She is limited with her functional mobility skills and activities.  Pt is limited with ambulation distance, standing duration, and stairs due to pain.  Pt unable to walk her dog and walk her neighborhood.  She has pain with driving and is very limited with household chores due to pain.  Pt hasn't received any therapy since this past surgery.  She should benefit from skilled PT services per protocol including aquatic therapy to address impairments and improve overall function.     OBJECTIVE IMPAIRMENTS: Abnormal gait, decreased activity tolerance, decreased endurance, decreased mobility, difficulty walking, decreased ROM, decreased strength, hypomobility, and pain.   ACTIVITY LIMITATIONS: lifting, bending, sitting, standing, squatting, stairs, transfers, and locomotion level  PARTICIPATION LIMITATIONS: cleaning, laundry, driving, shopping, and community activity  PERSONAL FACTORS: Time  since onset of injury/illness/exacerbation and 3+ comorbidities: R hip labral repair, L ACL repair x 2, cervical fusion x 2  are also affecting patient's functional outcome.   REHAB POTENTIAL: Good  CLINICAL DECISION MAKING: Evolving/moderate complexity  EVALUATION COMPLEXITY: Moderate   GOALS:  SHORT TERM GOALS: Target date:  05/28/2023   Pt will be independent with HEP for improved pain, strength, tolerance to activity, and function.  Baseline: Goal status: INITIAL  2.  Pt will tolerate aquatic therapy without adverse effects in order to perform exercises in a reduced stress environment and improve functional mobility, tolerance to activity, strength, and pain.  Baseline:  Goal status: MET -05/24/23 Target date:  05/21/2023   3.  Pt's worst pain will be no > than 8/10 for improved tolerance to activity and mobility.  Baseline: 5/10 Goal status: MET - 05/24/23  4.  Pt will report at least a 25% improvement in her daily mobility.   Baseline: 20% Goal status: IN PROGRESS - 05/24/23  5.  Pt will demonstrate improved quality of gait including improved Wb'ing thru R LE, reduced hip drop, and reduced limp.  Baseline:  Goal status: INITIAL  6.  Pt  will perform a 6 inch step up with good form and stability without significant pain Baseline:  Goal status: MET -05/24/23 Target date:  06/04/2023  7.  Pt will ambulate with a normalized heel to toe gait pattern without limping.   Goal status:  INITIAL  Target date:  06/18/2023    LONG TERM GOALS: Target date: 07/23/2023   Pt will ambulate extended community distance without significant pain and difficulty.  Baseline:  Goal status: INITIAL  2.  Pt will ascend and descend stairs with a reciprocal gait with a rail.  Baseline:  Goal status: INITIAL  3.  Pt will be able to walk her neighborhood without significant pain. Baseline:  Goal status: INITIAL  4.  Pt will be able to perform her ADLs and IADLs including household chores  without significant pain and difficulty.  Baseline:  Goal status: INITIAL  5.  Pt will squat with symmetrical Wb'ing in bilat LE's and good form without increased pain for improved functional strength.  Baseline:  Goal status: INITIAL  6.  Pt will demo 4 to 4+/5 strength in R hip abd, 4/5 in R hip flex, and 5/5 in R knee ext for improved performance of and tolerance with functional mobility.  Baseline:  Goal status: INITIAL   PLAN:  PT FREQUENCY: 2x/week  PT DURATION: 12 weeks  PLANNED INTERVENTIONS: 97164- PT Re-evaluation, 97110-Therapeutic exercises, 97530- Therapeutic activity, 97112- Neuromuscular re-education, 97535- Self Care, 16109- Manual therapy, [phone]- Gait training, [phone]- Aquatic Therapy, [phone]- Electrical stimulation (unattended), Patient/Family education, Balance training, Stair training, Taping, Dry Needling, Joint mobilization, Spinal mobilization, Scar mobilization, Cryotherapy, and Moist heat  PLAN FOR NEXT SESSION: see above.    Sanaya Gwilliam C. Mercy Malena PT, DPT 06/04/23 1:12 PM  [facility] [address] [place], [place], [address] Phone: [phone]   Fax:  [phone]

## 2023-06-06 ENCOUNTER — Encounter (HOSPITAL_BASED_OUTPATIENT_CLINIC_OR_DEPARTMENT_OTHER): Payer: Self-pay | Admitting: Physical Therapy

## 2023-06-06 ENCOUNTER — Ambulatory Visit (HOSPITAL_BASED_OUTPATIENT_CLINIC_OR_DEPARTMENT_OTHER): Admitting: Physical Therapy

## 2023-06-06 DIAGNOSIS — M25551 Pain in right hip: Secondary | ICD-10-CM

## 2023-06-06 DIAGNOSIS — M25651 Stiffness of right hip, not elsewhere classified: Secondary | ICD-10-CM

## 2023-06-06 DIAGNOSIS — M6281 Muscle weakness (generalized): Secondary | ICD-10-CM

## 2023-06-06 DIAGNOSIS — R262 Difficulty in walking, not elsewhere classified: Secondary | ICD-10-CM

## 2023-06-06 NOTE — Therapy (Signed)
 OUTPATIENT PHYSICAL THERAPY LOWER EXTREMITY TREATMENT   Patient Name: [name] MRN: [id number] DOB:[birth year], 38 y.o., female Today's Date: 06/06/2023  END OF SESSION:  PT End of Session - 06/06/23 1623     Visit Number 11    Number of Visits 24    Date for PT Re-Evaluation 07/23/23    Authorization Type [employer]    PT Start Time 1620    PT Stop Time 1658    PT Time Calculation (min) 38 min    Activity Tolerance Patient tolerated treatment well    Behavior During Therapy WFL for tasks assessed/performed                 Past Medical History:  Diagnosis Date   ACL graft tear (HCC)    left knee   Allergy    Angio-edema    Anxiety    Complication of anesthesia    woke up during surgery   Depression    Family history of adverse reaction to anesthesia     mom is difficult to put to sleep   IUD (intrauterine device) in place    Knee pain, right    Migraine headache    Sleep paralysis    Ulcerative proctitis (HCC)    Past Surgical History:  Procedure Laterality Date   ANTERIOR CRUCIATE LIGAMENT REPAIR Left 08/10/2014   Procedure: ANTERIOR CRUCIATE LIGAMENT (ACL) REPAIR;  Surgeon: Eugenia Mcalpine, MD;  [facility];  Service: Orthopedics;  Laterality: Left;   HIP SURGERY Right 08/25/2019   PAO surgery [facility]   HIP SURGERY Bilateral 01/05/2021   [facility], had hardware taken out   KNEE ARTHROSCOPY Left 08/10/2014   Procedure: ARTHROSCOPY KNEE DEBRIDEMENT ALLOGRAFT ACL REVISION RECONSTRUCTION;  Surgeon: Eugenia Mcalpine, MD;  Location: [facility];  Service: Orthopedics;  Laterality: Left;   KNEE ARTHROSCOPY W/ ACL RECONSTRUCTION Left 2006   SPINAL FUSION     widom tooth extraction     only had 1 wisdom tooth    Patient Active Problem List   Diagnosis Date Noted   Ulcerative proctitis with rectal bleeding (HCC) 06/16/2020   Bloating 06/16/2020   Chronic migraine without aura without status migrainosus, not  intractable 08/19/2018   Migraine with aura and with status migrainosus, not intractable 08/19/2018   Pain in left shoulder 08/19/2018   Pain in left ankle and joints of left foot 05/09/2018   Pain in left hip 05/09/2018   Cervicalgia 05/09/2018   Chronic rhinitis 11/12/2017   Angioedema 11/12/2017   Allergic reaction 11/12/2017   Chronic sinusitis 11/12/2017   Cervical intraepithelial neoplasia grade 1 10/01/2017   GAD (generalized anxiety disorder) 09/17/2014   ACL graft tear (HCC) 08/10/2014   S/P ACL reconstruction 08/10/2014   Migraine headache 05/15/2010     REFERRING PROVIDER: Lily Kocher, MD  REFERRING DIAG: [phone] (ICD-10-CM) - Status post hip surgery / s/p arthroscopy of hip  S32.691K (ICD-10-CM) - Other specified fracture of right ischium, subsequent encounter for fracture with nonunion  THERAPY DIAG:  Pain in right hip  Muscle weakness (generalized)  Difficulty in walking, not elsewhere classified  Stiffness of right hip, not elsewhere classified  Rationale for Evaluation and Treatment: Rehabilitation  ONSET DATE: DOS  12/27/2022   SUBJECTIVE:   SUBJECTIVE STATEMENT: Pt reports that her R hamstring feels very tight.   DN felt great. Rt ant hip also feels very tight.     Initial subjective Pt has a hx of hip surgeries.  She underwent R hip arthroscopy with femoroplasty, labral repair, and pelvis osteotomy on 08/24/2019 (PAO).  Pt then had L hip labral repair and hip bone osteotomy, femoroplasty on 03/21/20.  Pt had hardware removal on 01/05/21 in bilat hips to help promote bone growth which did not help.   Pt underwent Revision R hip femoroplasty, arthroscopy with labral repair, open internal fixation post pelv ring Fx on 12/27/2022.  Pt had an ischial nonunion.  Pt had x rays which showed that the hardware is intact and there is progressive healing of the ischial nonunion.  MD note indicated to continue to increase activities as tolerated and continue  with physical therapy with anticipated start of aquatic physical therapy.  MD sent surgical protocol for arthroscopic hip labral repair with femoral osteoplasty.  Pt states she hurts with everything.  Pt is limited with ambulation distance, standing duration, and stairs due to pain.  Pt unable to bend over.  Pt unable to walk her dog and walk her neighborhood.  Pt has pain with driving.  Pt is very limited with household chores due to pain.  Sitting is affected due to her hip pain.   Pt hasn't performed therapy since the surgery.  Pt states dry needling helped her last time she was in therapy.  Pt wants to perform aquatic therapy.    PERTINENT HISTORY: Hx of hip surgeries: -Revision R hip femoroplasty, arthroscopy with labral repair, open internal fixation post pelv ring Fx on 12/27/2022  -R hip arthroscopy with femoroplasty, labral repair, and pelvis osteotomy on 08/24/2019 (PAO)  -L hip labral repair and hip bone osteotomy, femoroplasty on 03/21/20 -hardware removal on 01/05/21 in bilat hips     L ACL repair x 2 in 06 and 2016 GAD Migraines Ulcerative colitis and celiac Mast cell activation syndrome Cervical fusion in 2020 and 2021   PAIN: Are you having any pain?  Yes  NPRS:  5/10 current, 10/10 worst, 4/10 best Location:  R anterior and posteror hip, R hamstring  PRECAUTIONS: Other: per surgical protocol, R hip labral repair, L ACL repair x 2, cervical fusion x 2   WEIGHT BEARING RESTRICTIONS:  none indicated on orders  FALLS:  Has patient fallen in last 6 months? No  LIVING ENVIRONMENT: Lives with: lives with their spouse Lives in:  2 story home but moving into a 1 story Stairs: yes   PLOF: Independent  PATIENT GOALS: to be pain-free, improve ROM, improve quality of life    OBJECTIVE:  Note: Objective measures were completed at Evaluation unless otherwise noted.  DIAGNOSTIC FINDINGS: Pelvis x ray on 04/05/23:   Findings/Impression:  Hardware: Right acetabular  plate and screw fixation without complication. Alignment: No dislocation. Bones: No acute fracture. Healing osteotomy of the right superior pubic ramus root. Joints: Mild degenerative changes of the left hip. Soft Tissues: Unremarkable.    PATIENT SURVEYS:  LEFS 41/80  COGNITION: Overall cognitive status: Within functional limits for tasks assessed          Pt states all of her incisions are healed and closed with no scabbing.    PALPATION: TTP:  anterior > lateral hip.  Tender in R glute and along entire HS to post knee   LOWER EXTREMITY ROM:  Active ROM Right eval Left eval  Hip flexion 105 118  Hip extension    Hip abduction 22 30  Hip adduction    Hip internal rotation 42 24  Hip external rotation 24 40  Knee flexion    Knee  extension    Ankle dorsiflexion    Ankle plantarflexion    Ankle inversion    Ankle eversion     (Blank rows = not tested)  LOWER EXTREMITY MMT:  Strength not tested   GAIT: Assistive device utilized: None Level of assistance: Complete Independence Comments: Pt has an antalgic limp.  She favors R LE with gait and appears to have a R hip drop.  7/10 pain with ambulation.                                                                                                                                TREATMENT DATE:   [facility] Adult PT Treatment:                                                DATE: 06/06/23  Therapeutic Exercise: Hesch self correction- Rt post innom & Lt anterior Bridge with ball squeeze x 5 Manual Therapy: IASTM/ STM to R hamstring.  TPR to R iliacus/ TFL/ rectus femoris Aquatic therapy:  * pendulums - side to side and front to/from back ~5 each improved form * straddling noodle: cycling (with even pull in LEs)  * tall kneeling on square thick noodle with intermittent UE on wall; tall kneeling on wonder board (too difficult to maintain LEs on it)  * belly on wonder board with UE on rainbow hand floats - opp arm leg gentle  lifts (cues to lengthen through chair more than lift) * plank on bench with alternating fire hydrants LEs   Treatment                            06/04/23: Blank lines following charge title = not provided on this treatment date.   Manual:  TPDN YES  Trigger Point Dry Needling  Subsequent Treatment: Instructions provided previously at initial dry needling treatment.   Patient Verbal Consent Given: Yes Education Handout Provided: Previously Provided Muscles Treated: Rt glut med/max & TFL; Lt thoracic paraspinals T5-8; Lt rhomboids, subscap Electrical Stimulation Performed: No Treatment Response/Outcome: twitch with decr muscle spasm Post rot of Lt innom IASTM incision on right hip prone rib mobs There-ex: Hooklying add with ab set Supine active hamstring stretch with iso hip flexion from 90/90 There-Act:  Self Care:  Nuro-Re-ed: Standing posture- shifting to Left heel for Lt glut engagement Gait Training:    [facility] Adult PT Treatment:                                                DATE: 05/30/23  Therapeutic Exercise: Hesch self correction- Rt post innom & Lt anterior Bridge with ball  squeeze x 5 Manual Therapy: IASTM/ STM to R hamstring, adductor.  TPR to quadratus femoris Aquatic therapy:  * pendulums - side to side and front to/from back ~3-5 each * straddling noodle: cycling (with even pull in LEs) and hip abdct/ addct (not tolerated in R groin) * plank on yellow noodle with alternating fire hydrants LEs   Treatment                            4/1: Blank lines following charge title = not provided on this treatment date.   Manual:  TPDN YES  Trigger Point Dry Needling  Initial Treatment: Pt instructed on Dry Needling rational, procedures, and possible side effects. Pt instructed to expect mild to moderate muscle soreness later in the day and/or into the next day.  Pt instructed in methods to reduce muscle soreness. Pt instructed to continue prescribed HEP. Patient  verbalized understanding of these instructions and education.   Had DN in the past at [facility].   Patient Verbal Consent Given: Yes Education Handout Provided: No in pt instructions Muscles Treated: bilateral glut max & med, Rt piriformis, Rt TFL Electrical Stimulation Performed: No Treatment Response/Outcome: twitch with decreased spasm Post rotation spring mob to Lt innominate There-ex: Hesch self correction- Rt post innom & Lt anterior C sit with lat pull down There-Act:  Self Care:  Nuro-Re-ed: Standing postural alignment- weight to heels and glut set Gait Training:        PATIENT EDUCATION:  Education details: aquatic therapy exercise modifications/ progressions  Person educated: Patient Education method: Explanation, Demonstration, Tactile cues, Verbal cues, Education comprehension: verbalized understanding, returned demonstration, verbal cues required, tactile cues required, and needs further education  HOME EXERCISE PROGRAM: Access Code: URL: [email or web address] Date: 04/30/2023 Prepared by: Aaron Edelman  Exercises - Supine Transversus Abdominis Bracing - Hands on Stomach  - 2 x daily - 7 x weekly - 2 sets - 10 reps - 5 seconds hold - Bent Knee Fallouts  - 1-2 x daily - 7 x weekly - 1-2 sets - 10 reps  ASSESSMENT:  CLINICAL IMPRESSION: Continued post Rt rotation notable but improved symmetry after MET correction. Continue focus on core strengthening in pool.  Progressing gradually towards goals   Initial Impression Patient is a 38 y.o. female 17 weeks and 5 days s/p Revision R hip femoroplasty, arthroscopy with labral repair, open internal fixation post pelv ring Fx on 12/27/2022.  Pt continues to have constant significant pain.  She is limited with her functional mobility skills and activities.  Pt is limited with ambulation distance, standing duration, and stairs due to pain.  Pt unable to walk her dog and walk her neighborhood.   She has pain with driving and is very limited with household chores due to pain.  Pt hasn't received any therapy since this past surgery.  She should benefit from skilled PT services per protocol including aquatic therapy to address impairments and improve overall function.     OBJECTIVE IMPAIRMENTS: Abnormal gait, decreased activity tolerance, decreased endurance, decreased mobility, difficulty walking, decreased ROM, decreased strength, hypomobility, and pain.   ACTIVITY LIMITATIONS: lifting, bending, sitting, standing, squatting, stairs, transfers, and locomotion level  PARTICIPATION LIMITATIONS: cleaning, laundry, driving, shopping, and community activity  PERSONAL FACTORS: Time since onset of injury/illness/exacerbation and 3+ comorbidities: R hip labral repair, L ACL repair x 2, cervical fusion x 2  are also affecting patient's functional outcome.   REHAB POTENTIAL: Good  CLINICAL DECISION MAKING: Evolving/moderate complexity  EVALUATION COMPLEXITY: Moderate   GOALS:  SHORT TERM GOALS: Target date:  05/28/2023   Pt will be independent with HEP for improved pain, strength, tolerance to activity, and function.  Baseline: Goal status: INITIAL  2.  Pt will tolerate aquatic therapy without adverse effects in order to perform exercises in a reduced stress environment and improve functional mobility, tolerance to activity, strength, and pain.  Baseline:  Goal status: MET -05/24/23 Target date:  05/21/2023   3.  Pt's worst pain will be no > than 8/10 for improved tolerance to activity and mobility.  Baseline: 5/10 Goal status: MET - 05/24/23  4.  Pt will report at least a 25% improvement in her daily mobility.   Baseline: 20% Goal status: IN PROGRESS - 05/24/23  5.  Pt will demonstrate improved quality of gait including improved Wb'ing thru R LE, reduced hip drop, and reduced limp.  Baseline:  Goal status: INITIAL  6.  Pt will perform a 6 inch step up with good form and stability  without significant pain Baseline:  Goal status: MET -05/24/23 Target date:  06/04/2023  7.  Pt will ambulate with a normalized heel to toe gait pattern without limping.   Goal status:  INITIAL  Target date:  06/18/2023    LONG TERM GOALS: Target date: 07/23/2023   Pt will ambulate extended community distance without significant pain and difficulty.  Baseline:  Goal status: INITIAL  2.  Pt will ascend and descend stairs with a reciprocal gait with a rail.  Baseline:  Goal status: INITIAL  3.  Pt will be able to walk her neighborhood without significant pain. Baseline:  Goal status: INITIAL  4.  Pt will be able to perform her ADLs and IADLs including household chores without significant pain and difficulty.  Baseline:  Goal status: INITIAL  5.  Pt will squat with symmetrical Wb'ing in bilat LE's and good form without increased pain for improved functional strength.  Baseline:  Goal status: INITIAL  6.  Pt will demo 4 to 4+/5 strength in R hip abd, 4/5 in R hip flex, and 5/5 in R knee ext for improved performance of and tolerance with functional mobility.  Baseline:  Goal status: INITIAL   PLAN:  PT FREQUENCY: 2x/week  PT DURATION: 12 weeks  PLANNED INTERVENTIONS: 97164- PT Re-evaluation, 97110-Therapeutic exercises, 97530- Therapeutic activity, 97112- Neuromuscular re-education, 97535- Self Care, 29562- Manual therapy, [phone]- Gait training, [phone]- Aquatic Therapy, [phone]- Electrical stimulation (unattended), Patient/Family education, Balance training, Stair training, Taping, Dry Needling, Joint mobilization, Spinal mobilization, Scar mobilization, Cryotherapy, and Moist heat  PLAN FOR NEXT SESSION: see above.    Mayer Camel, PTA 06/06/23 5:52 PM [facility] [address] [place], [place], [address] Phone: [phone]   Fax:  [phone]

## 2023-06-11 ENCOUNTER — Ambulatory Visit (HOSPITAL_BASED_OUTPATIENT_CLINIC_OR_DEPARTMENT_OTHER): Admitting: Physical Therapy

## 2023-06-11 DIAGNOSIS — M25551 Pain in right hip: Secondary | ICD-10-CM | POA: Diagnosis not present

## 2023-06-11 DIAGNOSIS — M25651 Stiffness of right hip, not elsewhere classified: Secondary | ICD-10-CM

## 2023-06-11 DIAGNOSIS — R262 Difficulty in walking, not elsewhere classified: Secondary | ICD-10-CM

## 2023-06-11 DIAGNOSIS — M6281 Muscle weakness (generalized): Secondary | ICD-10-CM

## 2023-06-11 NOTE — Therapy (Signed)
 OUTPATIENT PHYSICAL THERAPY LOWER EXTREMITY TREATMENT   Patient Name: [name] MRN: [id number] DOB:08-29-1985, 38 y.o., female Today's Date: 06/11/2023  END OF SESSION:  PT End of Session - 06/11/23 1355     Visit Number 12    Number of Visits 24    Date for PT Re-Evaluation 07/23/23    Authorization Type [employer]    PT Start Time 1352    PT Stop Time 1430    PT Time Calculation (min) 38 min    Activity Tolerance Patient tolerated treatment well    Behavior During Therapy WFL for tasks assessed/performed                 Past Medical History:  Diagnosis Date   ACL graft tear (HCC)    left knee   Allergy    Angio-edema    Anxiety    Complication of anesthesia    woke up during surgery   Depression    Family history of adverse reaction to anesthesia     mom is difficult to put to sleep   IUD (intrauterine device) in place    Knee pain, right    Migraine headache    Sleep paralysis    Ulcerative proctitis (HCC)    Past Surgical History:  Procedure Laterality Date   ANTERIOR CRUCIATE LIGAMENT REPAIR Left 08/10/2014   Procedure: ANTERIOR CRUCIATE LIGAMENT (ACL) REPAIR;  Surgeon: Eugenia Mcalpine, MD;  Location: [facility];  Service: Orthopedics;  Laterality: Left;   HIP SURGERY Right 08/25/2019   PAO surgery [facility]   HIP SURGERY Bilateral 01/05/2021   [facility], had hardware taken out   KNEE ARTHROSCOPY Left 08/10/2014   Procedure: ARTHROSCOPY KNEE DEBRIDEMENT ALLOGRAFT ACL REVISION RECONSTRUCTION;  Surgeon: Eugenia Mcalpine, MD;  Location: [facility];  Service: Orthopedics;  Laterality: Left;   KNEE ARTHROSCOPY W/ ACL RECONSTRUCTION Left 2006   SPINAL FUSION     widom tooth extraction     only had 1 wisdom tooth    Patient Active Problem List   Diagnosis Date Noted   Ulcerative proctitis with rectal bleeding (HCC) 06/16/2020   Bloating 06/16/2020   Chronic migraine without aura without status migrainosus, not  intractable 08/19/2018   Migraine with aura and with status migrainosus, not intractable 08/19/2018   Pain in left shoulder 08/19/2018   Pain in left ankle and joints of left foot 05/09/2018   Pain in left hip 05/09/2018   Cervicalgia 05/09/2018   Chronic rhinitis 11/12/2017   Angioedema 11/12/2017   Allergic reaction 11/12/2017   Chronic sinusitis 11/12/2017   Cervical intraepithelial neoplasia grade 1 10/01/2017   GAD (generalized anxiety disorder) 09/17/2014   ACL graft tear (HCC) 08/10/2014   S/P ACL reconstruction 08/10/2014   Migraine headache 05/15/2010     REFERRING PROVIDER: Lily Kocher, MD  REFERRING DIAG: [phone] (ICD-10-CM) - Status post hip surgery / s/p arthroscopy of hip  S32.691K (ICD-10-CM) - Other specified fracture of right ischium, subsequent encounter for fracture with nonunion  THERAPY DIAG:  Pain in right hip  Muscle weakness (generalized)  Stiffness of right hip, not elsewhere classified  Difficulty in walking, not elsewhere classified  Rationale for Evaluation and Treatment: Rehabilitation  ONSET DATE: DOS  12/27/2022   SUBJECTIVE:   SUBJECTIVE STATEMENT: Pt states her hip is hurting a lot today. Mostly feeling it in the ant and posterior thigh going into L shoulder.    Initial subjective Pt has a hx of hip surgeries.  She underwent R hip arthroscopy with femoroplasty, labral repair, and pelvis osteotomy on 08/24/2019 (PAO).  Pt then had L hip labral repair and hip bone osteotomy, femoroplasty on 03/21/20.  Pt had hardware removal on 01/05/21 in bilat hips to help promote bone growth which did not help.   Pt underwent Revision R hip femoroplasty, arthroscopy with labral repair, open internal fixation post pelv ring Fx on 12/27/2022.  Pt had an ischial nonunion.  Pt had x rays which showed that the hardware is intact and there is progressive healing of the ischial nonunion.  MD note indicated to continue to increase activities as tolerated  and continue with physical therapy with anticipated start of aquatic physical therapy.  MD sent surgical protocol for arthroscopic hip labral repair with femoral osteoplasty.  Pt states she hurts with everything.  Pt is limited with ambulation distance, standing duration, and stairs due to pain.  Pt unable to bend over.  Pt unable to walk her dog and walk her neighborhood.  Pt has pain with driving.  Pt is very limited with household chores due to pain.  Sitting is affected due to her hip pain.   Pt hasn't performed therapy since the surgery.  Pt states dry needling helped her last time she was in therapy.  Pt wants to perform aquatic therapy.    PERTINENT HISTORY: Hx of hip surgeries: -Revision R hip femoroplasty, arthroscopy with labral repair, open internal fixation post pelv ring Fx on 12/27/2022  -R hip arthroscopy with femoroplasty, labral repair, and pelvis osteotomy on 08/24/2019 (PAO)  -L hip labral repair and hip bone osteotomy, femoroplasty on 03/21/20 -hardware removal on 01/05/21 in bilat hips     L ACL repair x 2 in 06 and 2016 GAD Migraines Ulcerative colitis and celiac Mast cell activation syndrome Cervical fusion in 2020 and 2021   PAIN: Are you having any pain?  Yes  NPRS:  5/10 current, 10/10 worst, 4/10 best Location:  R anterior and posteror hip, R hamstring  PRECAUTIONS: Other: per surgical protocol, R hip labral repair, L ACL repair x 2, cervical fusion x 2   WEIGHT BEARING RESTRICTIONS:  none indicated on orders  FALLS:  Has patient fallen in last 6 months? No  LIVING ENVIRONMENT: Lives with: lives with their spouse Lives in:  2 story home but moving into a 1 story Stairs: yes   PLOF: Independent  PATIENT GOALS: to be pain-free, improve ROM, improve quality of life    OBJECTIVE:  Note: Objective measures were completed at Evaluation unless otherwise noted.  DIAGNOSTIC FINDINGS: Pelvis x ray on 04/05/23:   Findings/Impression:  Hardware: Right  acetabular plate and screw fixation without complication. Alignment: No dislocation. Bones: No acute fracture. Healing osteotomy of the right superior pubic ramus root. Joints: Mild degenerative changes of the left hip. Soft Tissues: Unremarkable.    PATIENT SURVEYS:  LEFS 41/80  COGNITION: Overall cognitive status: Within functional limits for tasks assessed          Pt states all of her incisions are healed and closed with no scabbing.    PALPATION: TTP:  anterior > lateral hip.  Tender in R glute and along entire HS to post knee   LOWER EXTREMITY ROM:  Active ROM Right eval Left eval  Hip flexion 105 118  Hip extension    Hip abduction 22 30  Hip adduction    Hip internal rotation 42 24  Hip external rotation 24 40  Knee flexion    Knee  extension    Ankle dorsiflexion    Ankle plantarflexion    Ankle inversion    Ankle eversion     (Blank rows = not tested)  LOWER EXTREMITY MMT:  Strength not tested   GAIT: Assistive device utilized: None Level of assistance: Complete Independence Comments: Pt has an antalgic limp.  She favors R LE with gait and appears to have a R hip drop.  7/10 pain with ambulation.                                                                                                                                TREATMENT DATE:   [facility] Adult PT Treatment:                                                DATE: 06/11/23 Therapeutic Exercise: Child's pose after needling Reviewed and discussed HEP while needling Manual Therapy: Skilled assessment and palpation for TPDN Trigger Point Dry Needling  Subsequent Treatment: Instructions provided previously at initial dry needling treatment.   Patient Verbal Consent Given: Yes Education Handout Provided: Previously Provided Muscles Treated: Rt glut med/max & TFL; Lt thoracic paraspinals T5-8; Lt rhomboids, subscap Electrical Stimulation Performed: No Treatment Response/Outcome: twitch with decr  muscle spasm STM & TPR glute med/max, TFL/hip flexors, L thoracic paraspinals, L rhomboids Neuromuscular re-ed:  Therapeutic Activity:  Gait:  Modalities:  Self Care:    [facility] Adult PT Treatment:                                                DATE: 06/06/23  Therapeutic Exercise: Hesch self correction- Rt post innom & Lt anterior Bridge with ball squeeze x 5 Manual Therapy: IASTM/ STM to R hamstring.  TPR to R iliacus/ TFL/ rectus femoris Aquatic therapy:  * pendulums - side to side and front to/from back ~5 each improved form * straddling noodle: cycling (with even pull in LEs)  * tall kneeling on square thick noodle with intermittent UE on wall; tall kneeling on wonder board (too difficult to maintain LEs on it)  * belly on wonder board with UE on rainbow hand floats - opp arm leg gentle lifts (cues to lengthen through chair more than lift) * plank on bench with alternating fire hydrants LEs   Treatment                            06/04/23: Blank lines following charge title = not provided on this treatment date.   Manual:  TPDN YES  Trigger Point Dry Needling  Subsequent Treatment: Instructions provided previously at initial dry needling treatment.   Patient  Verbal Consent Given: Yes Education Handout Provided: Previously Provided Muscles Treated: Rt glut med/max & TFL; Lt thoracic paraspinals T5-8; Lt rhomboids, subscap Electrical Stimulation Performed: No Treatment Response/Outcome: twitch with decr muscle spasm Post rot of Lt innom IASTM incision on right hip prone rib mobs There-ex: Hooklying add with ab set Supine active hamstring stretch with iso hip flexion from 90/90 There-Act:  Self Care:  Nuro-Re-ed: Standing posture- shifting to Left heel for Lt glut engagement Gait Training:    [facility] Adult PT Treatment:                                                DATE: 05/30/23  Therapeutic Exercise: Hesch self correction- Rt post innom & Lt anterior Bridge with  ball squeeze x 5 Manual Therapy: IASTM/ STM to R hamstring, adductor.  TPR to quadratus femoris Aquatic therapy:  * pendulums - side to side and front to/from back ~3-5 each * straddling noodle: cycling (with even pull in LEs) and hip abdct/ addct (not tolerated in R groin) * plank on yellow noodle with alternating fire hydrants LEs   Treatment                            4/1: Blank lines following charge title = not provided on this treatment date.   Manual:  TPDN YES  Trigger Point Dry Needling  Initial Treatment: Pt instructed on Dry Needling rational, procedures, and possible side effects. Pt instructed to expect mild to moderate muscle soreness later in the day and/or into the next day.  Pt instructed in methods to reduce muscle soreness. Pt instructed to continue prescribed HEP. Patient verbalized understanding of these instructions and education.   Had DN in the past at [facility].   Patient Verbal Consent Given: Yes Education Handout Provided: No in pt instructions Muscles Treated: bilateral glut max & med, Rt piriformis, Rt TFL Electrical Stimulation Performed: No Treatment Response/Outcome: twitch with decreased spasm Post rotation spring mob to Lt innominate There-ex: Hesch self correction- Rt post innom & Lt anterior C sit with lat pull down There-Act:  Self Care:  Nuro-Re-ed: Standing postural alignment- weight to heels and glut set Gait Training:        PATIENT EDUCATION:  Education details: aquatic therapy exercise modifications/ progressions  Person educated: Patient Education method: Explanation, Demonstration, Tactile cues, Verbal cues, Education comprehension: verbalized understanding, returned demonstration, verbal cues required, tactile cues required, and needs further education  HOME EXERCISE PROGRAM: Access Code: URL: [email or web address] Date: 04/30/2023 Prepared by: Marnie Siren  Exercises - Supine Transversus  Abdominis Bracing - Hands on Stomach  - 2 x daily - 7 x weekly - 2 sets - 10 reps - 5 seconds hold - Bent Knee Fallouts  - 1-2 x daily - 7 x weekly - 1-2 sets - 10 reps  ASSESSMENT:  CLINICAL IMPRESSION: Increased time needling this session due to pt reporting increased tightness from this past week (lots of home stressors). Verbally reviewed her HEP to continue to decrease muscle tightness and increase stability.   Initial Impression Patient is a 38 y.o. female 17 weeks and 5 days s/p Revision R hip femoroplasty, arthroscopy with labral repair, open internal fixation post pelv ring Fx on 12/27/2022.  Pt continues to have constant significant pain.  She is limited with her  functional mobility skills and activities.  Pt is limited with ambulation distance, standing duration, and stairs due to pain.  Pt unable to walk her dog and walk her neighborhood.  She has pain with driving and is very limited with household chores due to pain.  Pt hasn't received any therapy since this past surgery.  She should benefit from skilled PT services per protocol including aquatic therapy to address impairments and improve overall function.     OBJECTIVE IMPAIRMENTS: Abnormal gait, decreased activity tolerance, decreased endurance, decreased mobility, difficulty walking, decreased ROM, decreased strength, hypomobility, and pain.   ACTIVITY LIMITATIONS: lifting, bending, sitting, standing, squatting, stairs, transfers, and locomotion level  PARTICIPATION LIMITATIONS: cleaning, laundry, driving, shopping, and community activity  PERSONAL FACTORS: Time since onset of injury/illness/exacerbation and 3+ comorbidities: R hip labral repair, L ACL repair x 2, cervical fusion x 2  are also affecting patient's functional outcome.   REHAB POTENTIAL: Good  CLINICAL DECISION MAKING: Evolving/moderate complexity  EVALUATION COMPLEXITY: Moderate   GOALS:  SHORT TERM GOALS: Target date:  05/28/2023   Pt will be independent  with HEP for improved pain, strength, tolerance to activity, and function.  Baseline: Goal status: INITIAL  2.  Pt will tolerate aquatic therapy without adverse effects in order to perform exercises in a reduced stress environment and improve functional mobility, tolerance to activity, strength, and pain.  Baseline:  Goal status: MET -05/24/23 Target date:  05/21/2023   3.  Pt's worst pain will be no > than 8/10 for improved tolerance to activity and mobility.  Baseline: 5/10 Goal status: MET - 05/24/23  4.  Pt will report at least a 25% improvement in her daily mobility.   Baseline: 20% Goal status: IN PROGRESS - 05/24/23  5.  Pt will demonstrate improved quality of gait including improved Wb'ing thru R LE, reduced hip drop, and reduced limp.  Baseline:  Goal status: INITIAL  6.  Pt will perform a 6 inch step up with good form and stability without significant pain Baseline:  Goal status: MET -05/24/23 Target date:  06/04/2023  7.  Pt will ambulate with a normalized heel to toe gait pattern without limping.   Goal status:  INITIAL  Target date:  06/18/2023    LONG TERM GOALS: Target date: 07/23/2023   Pt will ambulate extended community distance without significant pain and difficulty.  Baseline:  Goal status: INITIAL  2.  Pt will ascend and descend stairs with a reciprocal gait with a rail.  Baseline:  Goal status: INITIAL  3.  Pt will be able to walk her neighborhood without significant pain. Baseline:  Goal status: INITIAL  4.  Pt will be able to perform her ADLs and IADLs including household chores without significant pain and difficulty.  Baseline:  Goal status: INITIAL  5.  Pt will squat with symmetrical Wb'ing in bilat LE's and good form without increased pain for improved functional strength.  Baseline:  Goal status: INITIAL  6.  Pt will demo 4 to 4+/5 strength in R hip abd, 4/5 in R hip flex, and 5/5 in R knee ext for improved performance of and tolerance with  functional mobility.  Baseline:  Goal status: INITIAL   PLAN:  PT FREQUENCY: 2x/week  PT DURATION: 12 weeks  PLANNED INTERVENTIONS: 97164- PT Re-evaluation, 97110-Therapeutic exercises, 97530- Therapeutic activity, 97112- Neuromuscular re-education, 97535- Self Care, [address]- Manual therapy, [phone]- Gait training, [phone]- Aquatic Therapy, [phone]- Electrical stimulation (unattended), Patient/Family education, Balance training, Stair training, Taping, Dry Needling, Joint  mobilization, Spinal mobilization, Scar mobilization, Cryotherapy, and Moist heat  PLAN FOR NEXT SESSION: see above.    Rhesa Forsberg April Ma L [place], [place] 06/11/23 2:29 PM [facility] [address] [place], [place], [address] Phone: [phone]   Fax:  [phone]

## 2023-06-13 ENCOUNTER — Ambulatory Visit (HOSPITAL_BASED_OUTPATIENT_CLINIC_OR_DEPARTMENT_OTHER): Admitting: Physical Therapy

## 2023-06-13 ENCOUNTER — Encounter (HOSPITAL_BASED_OUTPATIENT_CLINIC_OR_DEPARTMENT_OTHER): Payer: Self-pay | Admitting: Physical Therapy

## 2023-06-13 DIAGNOSIS — R262 Difficulty in walking, not elsewhere classified: Secondary | ICD-10-CM

## 2023-06-13 DIAGNOSIS — M25651 Stiffness of right hip, not elsewhere classified: Secondary | ICD-10-CM

## 2023-06-13 DIAGNOSIS — M25551 Pain in right hip: Secondary | ICD-10-CM | POA: Diagnosis not present

## 2023-06-13 DIAGNOSIS — M6281 Muscle weakness (generalized): Secondary | ICD-10-CM

## 2023-06-13 NOTE — Therapy (Signed)
 OUTPATIENT PHYSICAL THERAPY LOWER EXTREMITY TREATMENT   Patient Name: [name] MRN: [id number] DOB:February 09, 1986, 38 y.o., female Today's Date: 06/13/2023  END OF SESSION:  PT End of Session - 06/13/23 1605     Visit Number 13    Number of Visits 24    Date for PT Re-Evaluation 07/23/23    Authorization Type [employer]    PT Start Time 1531    PT Stop Time 1610    PT Time Calculation (min) 39 min    Activity Tolerance Patient tolerated treatment well    Behavior During Therapy WFL for tasks assessed/performed                 Past Medical History:  Diagnosis Date   ACL graft tear (HCC)    left knee   Allergy    Angio-edema    Anxiety    Complication of anesthesia    woke up during surgery   Depression    Family history of adverse reaction to anesthesia     mom is difficult to put to sleep   IUD (intrauterine device) in place    Knee pain, right    Migraine headache    Sleep paralysis    Ulcerative proctitis (HCC)    Past Surgical History:  Procedure Laterality Date   ANTERIOR CRUCIATE LIGAMENT REPAIR Left 08/10/2014   Procedure: ANTERIOR CRUCIATE LIGAMENT (ACL) REPAIR;  Surgeon: Genevie Kerns, MD;  Location: [facility];  Service: Orthopedics;  Laterality: Left;   HIP SURGERY Right 08/25/2019   PAO surgery [facility]   HIP SURGERY Bilateral 01/05/2021   [facility], had hardware taken out   KNEE ARTHROSCOPY Left 08/10/2014   Procedure: ARTHROSCOPY KNEE DEBRIDEMENT ALLOGRAFT ACL REVISION RECONSTRUCTION;  Surgeon: Genevie Kerns, MD;  Location: [facility];  Service: Orthopedics;  Laterality: Left;   KNEE ARTHROSCOPY W/ ACL RECONSTRUCTION Left 2006   SPINAL FUSION     widom tooth extraction     only had 1 wisdom tooth    Patient Active Problem List   Diagnosis Date Noted   Ulcerative proctitis with rectal bleeding (HCC) 06/16/2020   Bloating 06/16/2020   Chronic migraine without aura without status migrainosus, not  intractable 08/19/2018   Migraine with aura and with status migrainosus, not intractable 08/19/2018   Pain in left shoulder 08/19/2018   Pain in left ankle and joints of left foot 05/09/2018   Pain in left hip 05/09/2018   Cervicalgia 05/09/2018   Chronic rhinitis 11/12/2017   Angioedema 11/12/2017   Allergic reaction 11/12/2017   Chronic sinusitis 11/12/2017   Cervical intraepithelial neoplasia grade 1 10/01/2017   GAD (generalized anxiety disorder) 09/17/2014   ACL graft tear (HCC) 08/10/2014   S/P ACL reconstruction 08/10/2014   Migraine headache 05/15/2010     REFERRING PROVIDER: Allan Ishihara, MD  REFERRING DIAG: [phone] (ICD-10-CM) - Status post hip surgery / s/p arthroscopy of hip  S32.691K (ICD-10-CM) - Other specified fracture of right ischium, subsequent encounter for fracture with nonunion  THERAPY DIAG:  Pain in right hip  Muscle weakness (generalized)  Stiffness of right hip, not elsewhere classified  Difficulty in walking, not elsewhere classified  Rationale for Evaluation and Treatment: Rehabilitation  ONSET DATE: DOS  12/27/2022   SUBJECTIVE:   SUBJECTIVE STATEMENT: Pt states she began walking in neighborhood (30 min, 2x).    Initial subjective Pt has a hx of hip surgeries.  She underwent R hip arthroscopy with femoroplasty, labral repair, and pelvis osteotomy  on 08/24/2019 (PAO).  Pt then had L hip labral repair and hip bone osteotomy, femoroplasty on 03/21/20.  Pt had hardware removal on 01/05/21 in bilat hips to help promote bone growth which did not help.   Pt underwent Revision R hip femoroplasty, arthroscopy with labral repair, open internal fixation post pelv ring Fx on 12/27/2022.  Pt had an ischial nonunion.  Pt had x rays which showed that the hardware is intact and there is progressive healing of the ischial nonunion.  MD note indicated to continue to increase activities as tolerated and continue with physical therapy with anticipated start  of aquatic physical therapy.  MD sent surgical protocol for arthroscopic hip labral repair with femoral osteoplasty.  Pt states she hurts with everything.  Pt is limited with ambulation distance, standing duration, and stairs due to pain.  Pt unable to bend over.  Pt unable to walk her dog and walk her neighborhood.  Pt has pain with driving.  Pt is very limited with household chores due to pain.  Sitting is affected due to her hip pain.   Pt hasn't performed therapy since the surgery.  Pt states dry needling helped her last time she was in therapy.  Pt wants to perform aquatic therapy.    PERTINENT HISTORY: Hx of hip surgeries: -Revision R hip femoroplasty, arthroscopy with labral repair, open internal fixation post pelv ring Fx on 12/27/2022  -R hip arthroscopy with femoroplasty, labral repair, and pelvis osteotomy on 08/24/2019 (PAO)  -L hip labral repair and hip bone osteotomy, femoroplasty on 03/21/20 -hardware removal on 01/05/21 in bilat hips     L ACL repair x 2 in 06 and 2016 GAD Migraines Ulcerative colitis and celiac Mast cell activation syndrome Cervical fusion in 2020 and 2021   PAIN: Are you having any pain?  Yes  NPRS:  4/10 current,  Location:  R anterior and posteror hip, R hamstring  PRECAUTIONS: Other: per surgical protocol, R hip labral repair, L ACL repair x 2, cervical fusion x 2   WEIGHT BEARING RESTRICTIONS:  none indicated on orders  FALLS:  Has patient fallen in last 6 months? No  LIVING ENVIRONMENT: Lives with: lives with their spouse Lives in:  2 story home but moving into a 1 story Stairs: yes   PLOF: Independent  PATIENT GOALS: to be pain-free, improve ROM, improve quality of life    OBJECTIVE:  Note: Objective measures were completed at Evaluation unless otherwise noted.  DIAGNOSTIC FINDINGS: Pelvis x ray on 04/05/23:   Findings/Impression:  Hardware: Right acetabular plate and screw fixation without complication. Alignment: No  dislocation. Bones: No acute fracture. Healing osteotomy of the right superior pubic ramus root. Joints: Mild degenerative changes of the left hip. Soft Tissues: Unremarkable.    PATIENT SURVEYS:  LEFS 41/80  COGNITION: Overall cognitive status: Within functional limits for tasks assessed          Pt states all of her incisions are healed and closed with no scabbing.    PALPATION: TTP:  anterior > lateral hip.  Tender in R glute and along entire HS to post knee   LOWER EXTREMITY ROM:  Active ROM Right eval Left eval  Hip flexion 105 118  Hip extension    Hip abduction 22 30  Hip adduction    Hip internal rotation 42 24  Hip external rotation 24 40  Knee flexion    Knee extension    Ankle dorsiflexion    Ankle plantarflexion    Ankle  inversion    Ankle eversion     (Blank rows = not tested)  LOWER EXTREMITY MMT:  Strength not tested   GAIT: Assistive device utilized: None Level of assistance: Complete Independence Comments: Pt has an antalgic limp.  She favors R LE with gait and appears to have a R hip drop.  7/10 pain with ambulation.                                                                                                                                TREATMENT DATE:    [facility] Adult PT Treatment:                                                DATE: 06/13/23  Therapeutic Exercise: Hesch self correction- Rt post innom & Lt anterior -2 rounds Bridge with ball squeeze x 5, 2 rounds Manual Therapy: IASTM/ STM to R hamstring, TFL, glute med.  TPR to R iliacus/ rectus femoris Aquatic therapy:  * side step into squat with arm addct/ abdct -> with yellow hand floats * wide stance with core engaged, reciprocal arm swing with light resistance bells -> staggered stance  * wide stance with core engaged with horiz abdct/ addct with light resistance bells  * balanced standing on yellow noodle -> squats -> squats catching "air"  * tall kneeling on yellow noodle  with intermittent UE on wall; cues for stacking shoulders/hips/knees   [facility] Adult PT Treatment:                                                DATE: 06/11/23 Therapeutic Exercise: Child's pose after needling Reviewed and discussed HEP while needling Manual Therapy: Skilled assessment and palpation for TPDN Trigger Point Dry Needling  Subsequent Treatment: Instructions provided previously at initial dry needling treatment.   Patient Verbal Consent Given: Yes Education Handout Provided: Previously Provided Muscles Treated: Rt glut med/max & TFL; Lt thoracic paraspinals T5-8; Lt rhomboids, subscap Electrical Stimulation Performed: No Treatment Response/Outcome: twitch with decr muscle spasm STM & TPR glute med/max, TFL/hip flexors, L thoracic paraspinals, L rhomboids   [facility] Adult PT Treatment:                                                DATE: 06/06/23  Therapeutic Exercise: Hesch self correction- Rt post innom & Lt anterior Bridge with ball squeeze x 5 Manual Therapy: IASTM/ STM to R hamstring.  TPR to R iliacus/ TFL/ rectus femoris Aquatic therapy:  * pendulums - side to side and  front to/from back ~5 each improved form * straddling noodle: cycling (with even pull in LEs)  * tall kneeling on square thick noodle with intermittent UE on wall; tall kneeling on wonder board (too difficult to maintain LEs on it)  * belly on wonder board with UE on rainbow hand floats - opp arm leg gentle lifts (cues to lengthen through chair more than lift) * plank on bench with alternating fire hydrants LEs   Treatment                            06/04/23: Blank lines following charge title = not provided on this treatment date.   Manual:  TPDN YES  Trigger Point Dry Needling  Subsequent Treatment: Instructions provided previously at initial dry needling treatment.   Patient Verbal Consent Given: Yes Education Handout Provided: Previously Provided Muscles Treated: Rt glut med/max & TFL; Lt  thoracic paraspinals T5-8; Lt rhomboids, subscap Electrical Stimulation Performed: No Treatment Response/Outcome: twitch with decr muscle spasm Post rot of Lt innom IASTM incision on right hip prone rib mobs There-ex: Hooklying add with ab set Supine active hamstring stretch with iso hip flexion from 90/90 There-Act:  Self Care:  Nuro-Re-ed: Standing posture- shifting to Left heel for Lt glut engagement Gait Training:    [facility] Adult PT Treatment:                                                DATE: 05/30/23  Therapeutic Exercise: Hesch self correction- Rt post innom & Lt anterior Bridge with ball squeeze x 5 Manual Therapy: IASTM/ STM to R hamstring, adductor.  TPR to quadratus femoris Aquatic therapy:  * pendulums - side to side and front to/from back ~3-5 each * straddling noodle: cycling (with even pull in LEs) and hip abdct/ addct (not tolerated in R groin) * plank on yellow noodle with alternating fire hydrants LEs   Treatment                            4/1: Blank lines following charge title = not provided on this treatment date.   Manual:  TPDN YES  Trigger Point Dry Needling  Initial Treatment: Pt instructed on Dry Needling rational, procedures, and possible side effects. Pt instructed to expect mild to moderate muscle soreness later in the day and/or into the next day.  Pt instructed in methods to reduce muscle soreness. Pt instructed to continue prescribed HEP. Patient verbalized understanding of these instructions and education.   Had DN in the past at [facility].   Patient Verbal Consent Given: Yes Education Handout Provided: No in pt instructions Muscles Treated: bilateral glut max & med, Rt piriformis, Rt TFL Electrical Stimulation Performed: No Treatment Response/Outcome: twitch with decreased spasm Post rotation spring mob to Lt innominate There-ex: Hesch self correction- Rt post innom & Lt anterior C sit with lat pull down There-Act:  Self  Care:  Nuro-Re-ed: Standing postural alignment- weight to heels and glut set Gait Training:        PATIENT EDUCATION:  Education details: aquatic therapy exercise modifications/ progressions  Person educated: Patient Education method: Explanation, Demonstration, Tactile cues, Verbal cues, Education comprehension: verbalized understanding, returned demonstration, verbal cues required, tactile cues required, and needs further education  HOME EXERCISE PROGRAM:  Access Code: URL: [email or web address] Date: 04/30/2023 Prepared by: Marnie Siren  Exercises - Supine Transversus Abdominis Bracing - Hands on Stomach  - 2 x daily - 7 x weekly - 2 sets - 10 reps - 5 seconds hold - Bent Knee Fallouts  - 1-2 x daily - 7 x weekly - 1-2 sets - 10 reps  ASSESSMENT:  CLINICAL IMPRESSION: Good improvement in pelvic alignment post MET correction.  Pt instructed in doing this exercise at home as needed if she sees that her alignment is off, as we discussed.  Pt tolerated aquatic exercises well afterwards. Progressing towards goals.   Initial Impression Patient is a 38 y.o. female 17 weeks and 5 days s/p Revision R hip femoroplasty, arthroscopy with labral repair, open internal fixation post pelv ring Fx on 12/27/2022.  Pt continues to have constant significant pain.  She is limited with her functional mobility skills and activities.  Pt is limited with ambulation distance, standing duration, and stairs due to pain.  Pt unable to walk her dog and walk her neighborhood.  She has pain with driving and is very limited with household chores due to pain.  Pt hasn't received any therapy since this past surgery.  She should benefit from skilled PT services per protocol including aquatic therapy to address impairments and improve overall function.     OBJECTIVE IMPAIRMENTS: Abnormal gait, decreased activity tolerance, decreased endurance, decreased mobility, difficulty walking, decreased  ROM, decreased strength, hypomobility, and pain.   ACTIVITY LIMITATIONS: lifting, bending, sitting, standing, squatting, stairs, transfers, and locomotion level  PARTICIPATION LIMITATIONS: cleaning, laundry, driving, shopping, and community activity  PERSONAL FACTORS: Time since onset of injury/illness/exacerbation and 3+ comorbidities: R hip labral repair, L ACL repair x 2, cervical fusion x 2  are also affecting patient's functional outcome.   REHAB POTENTIAL: Good  CLINICAL DECISION MAKING: Evolving/moderate complexity  EVALUATION COMPLEXITY: Moderate   GOALS:  SHORT TERM GOALS: Target date:  05/28/2023   Pt will be independent with HEP for improved pain, strength, tolerance to activity, and function.  Baseline: Goal status: INITIAL  2.  Pt will tolerate aquatic therapy without adverse effects in order to perform exercises in a reduced stress environment and improve functional mobility, tolerance to activity, strength, and pain.  Baseline:  Goal status: MET -05/24/23 Target date:  05/21/2023   3.  Pt's worst pain will be no > than 8/10 for improved tolerance to activity and mobility.  Baseline: 5/10 Goal status: MET - 05/24/23  4.  Pt will report at least a 25% improvement in her daily mobility.   Baseline: 20% Goal status: IN PROGRESS - 05/24/23  5.  Pt will demonstrate improved quality of gait including improved Wb'ing thru R LE, reduced hip drop, and reduced limp.  Baseline:  Goal status: INITIAL  6.  Pt will perform a 6 inch step up with good form and stability without significant pain Baseline:  Goal status: MET -05/24/23 Target date:  06/04/2023  7.  Pt will ambulate with a normalized heel to toe gait pattern without limping.   Goal status:  INITIAL  Target date:  06/18/2023    LONG TERM GOALS: Target date: 07/23/2023   Pt will ambulate extended community distance without significant pain and difficulty.  Baseline:  Goal status: INITIAL  2.  Pt will ascend and  descend stairs with a reciprocal gait with a rail.  Baseline:  Goal status: INITIAL  3.  Pt will be able to walk her neighborhood without significant pain.  Baseline:  Goal status: In progress -06/13/23  4.  Pt will be able to perform her ADLs and IADLs including household chores without significant pain and difficulty.  Baseline:  Goal status: INITIAL  5.  Pt will squat with symmetrical Wb'ing in bilat LE's and good form without increased pain for improved functional strength.  Baseline:  Goal status: INITIAL  6.  Pt will demo 4 to 4+/5 strength in R hip abd, 4/5 in R hip flex, and 5/5 in R knee ext for improved performance of and tolerance with functional mobility.  Baseline:  Goal status: INITIAL   PLAN:  PT FREQUENCY: 2x/week  PT DURATION: 12 weeks  PLANNED INTERVENTIONS: 97164- PT Re-evaluation, 97110-Therapeutic exercises, 97530- Therapeutic activity, 97112- Neuromuscular re-education, 97535- Self Care, 16109- Manual therapy, [phone]- Gait training, [phone]- Aquatic Therapy, [phone]- Electrical stimulation (unattended), Patient/Family education, Balance training, Stair training, Taping, Dry Needling, Joint mobilization, Spinal mobilization, Scar mobilization, Cryotherapy, and Moist heat  PLAN FOR NEXT SESSION: see above.   Almedia Jacobsen, PTA 06/13/23 5:24 PM [facility] [address] [place], [place], [address] Phone: [phone]   Fax:  [phone]

## 2023-06-14 ENCOUNTER — Telehealth (HOSPITAL_COMMUNITY): Payer: Self-pay | Admitting: Psychiatry

## 2023-06-18 ENCOUNTER — Encounter (HOSPITAL_BASED_OUTPATIENT_CLINIC_OR_DEPARTMENT_OTHER): Admitting: Physical Therapy

## 2023-06-18 ENCOUNTER — Ambulatory Visit (HOSPITAL_BASED_OUTPATIENT_CLINIC_OR_DEPARTMENT_OTHER): Admitting: Physical Therapy

## 2023-06-18 DIAGNOSIS — M25651 Stiffness of right hip, not elsewhere classified: Secondary | ICD-10-CM

## 2023-06-18 DIAGNOSIS — M25551 Pain in right hip: Secondary | ICD-10-CM | POA: Diagnosis not present

## 2023-06-18 DIAGNOSIS — R262 Difficulty in walking, not elsewhere classified: Secondary | ICD-10-CM

## 2023-06-18 DIAGNOSIS — M6281 Muscle weakness (generalized): Secondary | ICD-10-CM

## 2023-06-18 NOTE — Therapy (Signed)
 OUTPATIENT PHYSICAL THERAPY LOWER EXTREMITY TREATMENT / PROGRESS NOTE   Patient Name: [name] MRN: [id number] DOB:1985/04/11, 38 y.o., female Today's Date: 06/19/2023  END OF SESSION:  PT End of Session - 06/18/23 1641     Visit Number 14    Number of Visits 36    Date for PT Re-Evaluation 08/20/23    Authorization Type [employer]    PT Start Time 1543    PT Stop Time 1634    PT Time Calculation (min) 51 min    Activity Tolerance Patient tolerated treatment well    Behavior During Therapy WFL for tasks assessed/performed                  Past Medical History:  Diagnosis Date   ACL graft tear (HCC)    left knee   Allergy     Angio-edema    Anxiety    Complication of anesthesia    woke up during surgery   Depression    Family history of adverse reaction to anesthesia     mom is difficult to put to sleep   IUD (intrauterine device) in place    Knee pain, right    Migraine headache    Sleep paralysis    Ulcerative proctitis (HCC)    Past Surgical History:  Procedure Laterality Date   ANTERIOR CRUCIATE LIGAMENT REPAIR Left 08/10/2014   Procedure: ANTERIOR CRUCIATE LIGAMENT (ACL) REPAIR;  Surgeon: Genevie Kerns, MD;  Location: [facility];  Service: Orthopedics;  Laterality: Left;   HIP SURGERY Right 08/25/2019   PAO surgery [facility]   HIP SURGERY Bilateral 01/05/2021   [facility], had hardware taken out   KNEE ARTHROSCOPY Left 08/10/2014   Procedure: ARTHROSCOPY KNEE DEBRIDEMENT ALLOGRAFT ACL REVISION RECONSTRUCTION;  Surgeon: Genevie Kerns, MD;  Location: [facility];  Service: Orthopedics;  Laterality: Left;   KNEE ARTHROSCOPY W/ ACL RECONSTRUCTION Left 2006   SPINAL FUSION     widom tooth extraction     only had 1 wisdom tooth    Patient Active Problem List   Diagnosis Date Noted   Ulcerative proctitis with rectal bleeding (HCC) 06/16/2020   Bloating 06/16/2020   Chronic migraine without aura without status  migrainosus, not intractable 08/19/2018   Migraine with aura and with status migrainosus, not intractable 08/19/2018   Pain in left shoulder 08/19/2018   Pain in left ankle and joints of left foot 05/09/2018   Pain in left hip 05/09/2018   Cervicalgia 05/09/2018   Chronic rhinitis 11/12/2017   Angioedema 11/12/2017   Allergic reaction 11/12/2017   Chronic sinusitis 11/12/2017   Cervical intraepithelial neoplasia grade 1 10/01/2017   GAD (generalized anxiety disorder) 09/17/2014   ACL graft tear (HCC) 08/10/2014   S/P ACL reconstruction 08/10/2014   Migraine headache 05/15/2010     REFERRING PROVIDER: Allan Ishihara, MD  REFERRING DIAG: [phone] (ICD-10-CM) - Status post hip surgery / s/p arthroscopy of hip  S32.691K (ICD-10-CM) - Other specified fracture of right ischium, subsequent encounter for fracture with nonunion  THERAPY DIAG:  Pain in right hip  Muscle weakness (generalized)  Difficulty in walking, not elsewhere classified  Stiffness of right hip, not elsewhere classified  Rationale for Evaluation and Treatment: Rehabilitation  ONSET DATE: DOS  12/27/2022   SUBJECTIVE:   SUBJECTIVE STATEMENT: Pt is 24 weeks and 5 days post op.  Pt states she wants to do more.  Pt reports she may have improved core strength.  Pt she is still  in so much pain.  Pain limits her activities.  Pt states pain limits her ROM with walking.  She reports her function is about the same and reports no functional improvements.  Pt reports a 20% improvement in her daily mobility.  Pt just started walking the neighborhood for 30 mins and has increased pain with walking.  Walking is one of the hardest things to do.  Pt states her pain is in the same places as it was prior to starting therapy.  Pt states nothing is helping the HS. Pt reports compliance with HEP.     PERTINENT HISTORY: Hx of hip surgeries: -Revision R hip femoroplasty, arthroscopy with labral repair, open internal fixation post  pelv ring Fx on 12/27/2022  -R hip arthroscopy with femoroplasty, labral repair, and pelvis osteotomy on 08/24/2019 (PAO)  -L hip labral repair and hip bone osteotomy, femoroplasty on 03/21/20 -hardware removal on 01/05/21 in bilat hips     L ACL repair x 2 in 06 and 2016 GAD Migraines Ulcerative colitis and celiac Mast cell activation syndrome Cervical fusion in 2020 and 2021   PAIN: Are you having any pain?  Yes  NPRS:  6/10 current, 8/10 worst, 4/10 best  Location:  R anterior and posteror hip, R glute/hamstring Type:  constant  PRECAUTIONS: Other: per surgical protocol, R hip labral repair, L ACL repair x 2, cervical fusion x 2   WEIGHT BEARING RESTRICTIONS:  none indicated on orders  FALLS:  Has patient fallen in last 6 months? No  LIVING ENVIRONMENT: Lives with: lives with their spouse Lives in:  2 story home but moving into a 1 story Stairs: yes   PLOF: Independent  PATIENT GOALS: to be pain-free, improve ROM, improve quality of life    OBJECTIVE:  Note: Objective measures were completed at Evaluation unless otherwise noted.  DIAGNOSTIC FINDINGS: Pelvis x ray on 04/05/23:   Findings/Impression:  Hardware: Right acetabular plate and screw fixation without complication. Alignment: No dislocation. Bones: No acute fracture. Healing osteotomy of the right superior pubic ramus root. Joints: Mild degenerative changes of the left hip. Soft Tissues: Unremarkable.    PATIENT SURVEYS:  LEFS Initial/current:  41/80 / 35/80  COGNITION: Overall cognitive status: Within functional limits for tasks assessed       LOWER EXTREMITY ROM:  Active ROM Right eval Left eval Right 4/22  Hip flexion 105 118 104  Hip extension     Hip abduction 22 30 28   Hip adduction     Hip internal rotation 42 24   Hip external rotation 24 40 32  Knee flexion     Knee extension     Ankle dorsiflexion     Ankle plantarflexion     Ankle inversion     Ankle eversion      (Blank  rows = not tested)  LOWER EXTREMITY HHD:  Hip abd (seated):  R:  11.3, L:  16.9   GAIT: Assistive device utilized: None Level of assistance: Complete Independence Comments:  Improved quality of gait with reduced limp though still favors R LE with gait.  Appears to have a R hip drop.  Decreased hip extension.  7/10 pain with ambulation.  TREATMENT DATE:    06/18/23  Reviewed current function, pain levels, and compliance with HEP. Assessed ROM, gait, and strength.  See above. Pt completed LEFS.  S/L clams 2x10 Prone hip extension x 5 bridge x 10, bridge with GTB around thighs x10 Retro walking  See below for pt education.    [facility] Adult PT Treatment:                                                DATE: 06/13/23  Therapeutic Exercise: Hesch self correction- Rt post innom & Lt anterior -2 rounds Bridge with ball squeeze x 5, 2 rounds Manual Therapy: IASTM/ STM to R hamstring, TFL, glute med.  TPR to R iliacus/ rectus femoris Aquatic therapy:  * side step into squat with arm addct/ abdct -> with yellow hand floats * wide stance with core engaged, reciprocal arm swing with light resistance bells -> staggered stance  * wide stance with core engaged with horiz abdct/ addct with light resistance bells  * balanced standing on yellow noodle -> squats -> squats catching "air"  * tall kneeling on yellow noodle with intermittent UE on wall; cues for stacking shoulders/hips/knees   [facility] Adult PT Treatment:                                                DATE: 06/11/23 Therapeutic Exercise: Child's pose after needling Reviewed and discussed HEP while needling Manual Therapy: Skilled assessment and palpation for TPDN Trigger Point Dry Needling  Subsequent Treatment: Instructions provided previously at initial dry needling treatment.   Patient Verbal Consent  Given: Yes Education Handout Provided: Previously Provided Muscles Treated: Rt glut med/max & TFL; Lt thoracic paraspinals T5-8; Lt rhomboids, subscap Electrical Stimulation Performed: No Treatment Response/Outcome: twitch with decr muscle spasm STM & TPR glute med/max, TFL/hip flexors, L thoracic paraspinals, L rhomboids   [facility] Adult PT Treatment:                                                DATE: 06/06/23  Therapeutic Exercise: Hesch self correction- Rt post innom & Lt anterior Bridge with ball squeeze x 5 Manual Therapy: IASTM/ STM to R hamstring.  TPR to R iliacus/ TFL/ rectus femoris Aquatic therapy:  * pendulums - side to side and front to/from back ~5 each improved form * straddling noodle: cycling (with even pull in LEs)  * tall kneeling on square thick noodle with intermittent UE on wall; tall kneeling on wonder board (too difficult to maintain LEs on it)  * belly on wonder board with UE on rainbow hand floats - opp arm leg gentle lifts (cues to lengthen through chair more than lift) * plank on bench with alternating fire hydrants LEs   Treatment                            06/04/23: Blank lines following charge title = not provided on this treatment date.   Manual:  TPDN YES  Trigger Point Dry Needling  Subsequent Treatment: Instructions provided previously at  initial dry needling treatment.   Patient Verbal Consent Given: Yes Education Handout Provided: Previously Provided Muscles Treated: Rt glut med/max & TFL; Lt thoracic paraspinals T5-8; Lt rhomboids, subscap Electrical Stimulation Performed: No Treatment Response/Outcome: twitch with decr muscle spasm Post rot of Lt innom IASTM incision on right hip prone rib mobs There-ex: Hooklying add with ab set Supine active hamstring stretch with iso hip flexion from 90/90 There-Act:  Self Care:  Nuro-Re-ed: Standing posture- shifting to Left heel for Lt glut engagement Gait Training:    [facility] Adult PT Treatment:                                                 DATE: 05/30/23  Therapeutic Exercise: Hesch self correction- Rt post innom & Lt anterior Bridge with ball squeeze x 5 Manual Therapy: IASTM/ STM to R hamstring, adductor.  TPR to quadratus femoris Aquatic therapy:  * pendulums - side to side and front to/from back ~3-5 each * straddling noodle: cycling (with even pull in LEs) and hip abdct/ addct (not tolerated in R groin) * plank on yellow noodle with alternating fire hydrants LEs   Treatment                            4/1: Blank lines following charge title = not provided on this treatment date.   Manual:  TPDN YES  Trigger Point Dry Needling  Initial Treatment: Pt instructed on Dry Needling rational, procedures, and possible side effects. Pt instructed to expect mild to moderate muscle soreness later in the day and/or into the next day.  Pt instructed in methods to reduce muscle soreness. Pt instructed to continue prescribed HEP. Patient verbalized understanding of these instructions and education.   Had DN in the past at [facility].   Patient Verbal Consent Given: Yes Education Handout Provided: No in pt instructions Muscles Treated: bilateral glut max & med, Rt piriformis, Rt TFL Electrical Stimulation Performed: No Treatment Response/Outcome: twitch with decreased spasm Post rotation spring mob to Lt innominate There-ex: Hesch self correction- Rt post innom & Lt anterior C sit with lat pull down There-Act:  Self Care:  Nuro-Re-ed: Standing postural alignment- weight to heels and glut set Gait Training:        PATIENT EDUCATION:  Education details:  PT answered pt's questions and educated pt concerning POC, scope of PT, process of PT, and rationale of interventions.  PT also educated pt in objective findings, relevant anatomy, and exercise form.  Person educated: Patient Education method: Explanation, Demonstration, Tactile cues, Verbal cues, Education  comprehension: verbalized understanding, returned demonstration, verbal cues required, tactile cues required, and needs further education  HOME EXERCISE PROGRAM: Access Code: URL: [email or web address] Date: 04/30/2023 Prepared by: Marnie Siren  Exercises - Supine Transversus Abdominis Bracing - Hands on Stomach  - 2 x daily - 7 x weekly - 2 sets - 10 reps - 5 seconds hold - Bent Knee Fallouts  - 1-2 x daily - 7 x weekly - 1-2 sets - 10 reps  ASSESSMENT:  CLINICAL IMPRESSION:  Pt has been primarily performing aquatic therapy with some land therapy.  She presents to PT today for a land based treatment and PT assessed her for a progress note.  Pt continues to c/o of constant pain and states pain limits her  activities.  She continues to have significant pain and states that her pain is in the same locations.  Pt states her HS pain has not improved.  She continues to have  significant pain and difficulty with walking.  She states her function is about the same, but reports a 20% improvement in her daily mobility.  Pt has recently started walking the neighborhood for 30 mins though has increased pain.  Her worst pain has improved from 10/10 to 8/10 and her best pain has not changed.  Pt continues to have gait deficits though has improved quality of gait with reduced limp.  She reports a 7/10 pain while ambulating in the clinic.  Pt demonstrated improved hip ER and abduction AROM and slightly worse flexion AROM.  Pt has weakness in bilat hip abduction.  She demonstrates worse self perceived disability with LEFS decreasing by 6 points.  Pt able was challenged with S/L clams though was able to perform.  She was more uncomfortable with prone hip extension and PT had pt stop after 5 reps.  Pt has met STG's #2,3,6 and partially met STG's #4,5.  PT will plan to transition more to land based therapy and increase frequency.  Pt should benefit from cont skilled PT to address impairments and  goals and to improve overall function.      OBJECTIVE IMPAIRMENTS: Abnormal gait, decreased activity tolerance, decreased endurance, decreased mobility, difficulty walking, decreased ROM, decreased strength, hypomobility, and pain.   ACTIVITY LIMITATIONS: lifting, bending, sitting, standing, squatting, stairs, transfers, and locomotion level  PARTICIPATION LIMITATIONS: cleaning, laundry, driving, shopping, and community activity  PERSONAL FACTORS: Time since onset of injury/illness/exacerbation and 3+ comorbidities: R hip labral repair, L ACL repair x 2, cervical fusion x 2  are also affecting patient's functional outcome.   REHAB POTENTIAL: Good  CLINICAL DECISION MAKING: Evolving/moderate complexity  EVALUATION COMPLEXITY: Moderate   GOALS:  SHORT TERM GOALS: Target date:  05/28/2023   Pt will be independent with HEP for improved pain, strength, tolerance to activity, and function.  Baseline: Goal status: INITIAL  2.  Pt will tolerate aquatic therapy without adverse effects in order to perform exercises in a reduced stress environment and improve functional mobility, tolerance to activity, strength, and pain.  Baseline:  Goal status: MET -05/24/23 Target date:  05/21/2023   3.  Pt's worst pain will be no > than 8/10 for improved tolerance to activity and mobility.  Baseline: 5/10 Goal status: MET - 05/24/23  4.  Pt will report at least a 25% improvement in her daily mobility.   Baseline: 20% Goal status: PARTIALLY MET - 05/24/23 and 4/22  5.  Pt will demonstrate improved quality of gait including improved Wb'ing thru R LE, reduced hip drop, and reduced limp.  Baseline:  Goal status: PARTIALLY MET  4/22  6.  Pt will perform a 6 inch step up with good form and stability without significant pain Baseline:  Goal status: MET -05/24/23 Target date:  06/04/2023  7.  Pt will ambulate with a normalized heel to toe gait pattern without limping.   Goal status:  PROGRESSING   4/22  Target date:  06/18/2023    LONG TERM GOALS: Target date: 08/20/2023   Pt will ambulate extended community distance without significant pain and difficulty.  Baseline:  Goal status: ONGOING  2.  Pt will ascend and descend stairs with a reciprocal gait with a rail.  Baseline:  Goal status: ONGOING  3.  Pt will be able to walk her  neighborhood without significant pain. Baseline:  Goal status: In progress -06/13/23  4.  Pt will be able to perform her ADLs and IADLs including household chores without significant pain and difficulty.  Baseline:  Goal status: ONGOING  5.  Pt will squat with symmetrical Wb'ing in bilat LE's and good form without increased pain for improved functional strength.  Baseline:  Goal status: INITIAL  6.  Pt will demo 4 to 4+/5 strength in R hip abd, 4/5 in R hip flex, and 5/5 in R knee ext for improved performance of and tolerance with functional mobility.  Baseline:  Goal status: INITIAL   PLAN:  PT FREQUENCY:   2-3x/wk  PT DURATION: 9 weeks  PLANNED INTERVENTIONS: 97164- PT Re-evaluation, 97110-Therapeutic exercises, 97530- Therapeutic activity, 97112- Neuromuscular re-education, 97535- Self Care, 36644- Manual therapy, [phone]- Gait training, [phone]- Aquatic Therapy, [phone]- Electrical stimulation (unattended), Patient/Family education, Balance training, Stair training, Taping, Dry Needling, Joint mobilization, Spinal mobilization, Scar mobilization, Cryotherapy, and Moist heat  PLAN FOR NEXT SESSION:  Pt to transition to more land based therapy.  Cont with DN and exercises per protocol per pt tolerance.   Trina Fujita III PT, DPT 06/19/23 7:01 AM

## 2023-06-19 ENCOUNTER — Encounter (HOSPITAL_BASED_OUTPATIENT_CLINIC_OR_DEPARTMENT_OTHER): Payer: Self-pay | Admitting: Physical Therapy

## 2023-06-19 ENCOUNTER — Ambulatory Visit (HOSPITAL_BASED_OUTPATIENT_CLINIC_OR_DEPARTMENT_OTHER): Admitting: Physical Therapy

## 2023-06-19 DIAGNOSIS — M25551 Pain in right hip: Secondary | ICD-10-CM | POA: Diagnosis not present

## 2023-06-19 DIAGNOSIS — M25651 Stiffness of right hip, not elsewhere classified: Secondary | ICD-10-CM

## 2023-06-19 DIAGNOSIS — R262 Difficulty in walking, not elsewhere classified: Secondary | ICD-10-CM

## 2023-06-19 DIAGNOSIS — M6281 Muscle weakness (generalized): Secondary | ICD-10-CM

## 2023-06-19 NOTE — Therapy (Signed)
 OUTPATIENT PHYSICAL THERAPY LOWER EXTREMITY TREATMENT /    Patient Name: Maryfrances Portugal MRN: [id number] DOB:05/22/1985, 38 y.o., female Today's Date: 06/20/2023  END OF SESSION:  PT End of Session - 06/19/23 1433     Visit Number 15    Number of Visits 36    Date for PT Re-Evaluation 08/20/23    Authorization Type [employer]    PT Start Time 1101    PT Stop Time 1144    PT Time Calculation (min) 43 min    Activity Tolerance Patient tolerated treatment well;No increased pain    Behavior During Therapy WFL for tasks assessed/performed                   Past Medical History:  Diagnosis Date   ACL graft tear (HCC)    left knee   Allergy     Angio-edema    Anxiety    Complication of anesthesia    woke up during surgery   Depression    Family history of adverse reaction to anesthesia     mom is difficult to put to sleep   IUD (intrauterine device) in place    Knee pain, right    Migraine headache    Sleep paralysis    Ulcerative proctitis [facility])    Past Surgical History:  Procedure Laterality Date   ANTERIOR CRUCIATE LIGAMENT REPAIR Left 08/10/2014   Procedure: ANTERIOR CRUCIATE LIGAMENT (ACL) REPAIR;  Surgeon: Genevie Kerns, MD;  Location: [facility];  Service: Orthopedics;  Laterality: Left;   HIP SURGERY Right 08/25/2019   PAO surgery [facility]   HIP SURGERY Bilateral 01/05/2021   [facility], had hardware taken out   KNEE ARTHROSCOPY Left 08/10/2014   Procedure: ARTHROSCOPY KNEE DEBRIDEMENT ALLOGRAFT ACL REVISION RECONSTRUCTION;  Surgeon: Genevie Kerns, MD;  Location: [facility];  Service: Orthopedics;  Laterality: Left;   KNEE ARTHROSCOPY W/ ACL RECONSTRUCTION Left 2006   SPINAL FUSION     widom tooth extraction     only had 1 wisdom tooth    Patient Active Problem List   Diagnosis Date Noted   Ulcerative proctitis with rectal bleeding (HCC) 06/16/2020   Bloating 06/16/2020   Chronic migraine without aura without status  migrainosus, not intractable 08/19/2018   Migraine with aura and with status migrainosus, not intractable 08/19/2018   Pain in left shoulder 08/19/2018   Pain in left ankle and joints of left foot 05/09/2018   Pain in left hip 05/09/2018   Cervicalgia 05/09/2018   Chronic rhinitis 11/12/2017   Angioedema 11/12/2017   Allergic reaction 11/12/2017   Chronic sinusitis 11/12/2017   Cervical intraepithelial neoplasia grade 1 10/01/2017   GAD (generalized anxiety disorder) 09/17/2014   ACL graft tear (HCC) 08/10/2014   S/P ACL reconstruction 08/10/2014   Migraine headache 05/15/2010     REFERRING PROVIDER: Allan Ishihara, MD  REFERRING DIAG: [phone] (ICD-10-CM) - Status post hip surgery / s/p arthroscopy of hip  S32.691K (ICD-10-CM) - Other specified fracture of right ischium, subsequent encounter for fracture with nonunion  THERAPY DIAG:  Pain in right hip  Muscle weakness (generalized)  Stiffness of right hip, not elsewhere classified  Difficulty in walking, not elsewhere classified  Rationale for Evaluation and Treatment: Rehabilitation  ONSET DATE: DOS  12/27/2022   SUBJECTIVE:   SUBJECTIVE STATEMENT: Pt is 24 weeks and 5 days post op.  Pt states she wants to do more.  Pt reports she may have improved core strength.  Pt she  is still in so much pain.  Pain limits her activities.  Pt states pain limits her ROM with walking.  She reports her function is about the same and reports no functional improvements.  Pt reports a 20% improvement in her daily mobility.  Pt just started walking the neighborhood for 30 mins and has increased pain with walking.  Walking is one of the hardest things to do.  Pt states her pain is in the same places as it was prior to starting therapy.  Pt states nothing is helping the HS. Pt reports compliance with HEP.     PERTINENT HISTORY: Hx of hip surgeries: -Revision R hip femoroplasty, arthroscopy with labral repair, open internal fixation post  pelv ring Fx on 12/27/2022  -R hip arthroscopy with femoroplasty, labral repair, and pelvis osteotomy on 08/24/2019 (PAO)  -L hip labral repair and hip bone osteotomy, femoroplasty on 03/21/20 -hardware removal on 01/05/21 in bilat hips     L ACL repair x 2 in 06 and 2016 GAD Migraines Ulcerative colitis and celiac Mast cell activation syndrome Cervical fusion in 2020 and 2021   PAIN: Are you having any pain?  Yes  NPRS:  6/10 current, 8/10 worst, 4/10 best  Location:  R anterior and posteror hip, R glute/hamstring Type:  constant  PRECAUTIONS: Other: per surgical protocol, R hip labral repair, L ACL repair x 2, cervical fusion x 2   WEIGHT BEARING RESTRICTIONS:  none indicated on orders  FALLS:  Has patient fallen in last 6 months? No  LIVING ENVIRONMENT: Lives with: lives with their spouse Lives in:  2 story home but moving into a 1 story Stairs: yes   PLOF: Independent  PATIENT GOALS: to be pain-free, improve ROM, improve quality of life    OBJECTIVE:  Note: Objective measures were completed at Evaluation unless otherwise noted.  DIAGNOSTIC FINDINGS: Pelvis x ray on 04/05/23:   Findings/Impression:  Hardware: Right acetabular plate and screw fixation without complication. Alignment: No dislocation. Bones: No acute fracture. Healing osteotomy of the right superior pubic ramus root. Joints: Mild degenerative changes of the left hip. Soft Tissues: Unremarkable.    PATIENT SURVEYS:  LEFS Initial/current:  41/80 / 35/80  COGNITION: Overall cognitive status: Within functional limits for tasks assessed       LOWER EXTREMITY ROM:  Active ROM Right eval Left eval Right 4/22  Hip flexion 105 118 104  Hip extension     Hip abduction 22 30 28   Hip adduction     Hip internal rotation 42 24   Hip external rotation 24 40 32  Knee flexion     Knee extension     Ankle dorsiflexion     Ankle plantarflexion     Ankle inversion     Ankle eversion      (Blank  rows = not tested)  LOWER EXTREMITY HHD:  Hip abd (seated):  R:  11.3, L:  16.9   GAIT: Assistive device utilized: None Level of assistance: Complete Independence Comments:  Improved quality of gait with reduced limp though still favors R LE with gait.  Appears to have a R hip drop.  Decreased hip extension.  7/10 pain with ambulation.  TREATMENT DATE:   4/23 Manual: All PROM performed with distraction to reduce pain and improve movement Trigger Point Dry Needling  Subsequent Treatment: Instructions provided previously at initial dry needling treatment.   Patient Verbal Consent Given: Yes Education Handout Provided: Previously Provided Muscles Treated: TFL, Hamstring, glute medusing a .30x50 needle 2x in each  Electrical Stimulation Performed: No Treatment Response/Outcome: muscle twitch responses and decrease in muscle tightness  Pt education on theracane STM There-ex: Nu-step warm up Supine clamshells red RTB 2x10 Supine marches 2x10 red RTB Bridge with band red 3x10 RTB   Reviewed hpw to use RPE for sets and reps   06/18/23  Reviewed current function, pain levels, and compliance with HEP. Assessed ROM, gait, and strength.  See above. Pt completed LEFS.  S/L clams 2x10 Prone hip extension x 5 bridge x 10, bridge with GTB around thighs x10 Retro walking  See below for pt education.    [facility] Adult PT Treatment:                                                DATE: 06/13/23  Therapeutic Exercise: Hesch self correction- Rt post innom & Lt anterior -2 rounds Bridge with ball squeeze x 5, 2 rounds Manual Therapy: IASTM/ STM to R hamstring, TFL, glute med.  TPR to R iliacus/ rectus femoris Aquatic therapy:  * side step into squat with arm addct/ abdct -> with yellow hand floats * wide stance with core engaged,  reciprocal arm swing with light resistance bells -> staggered stance  * wide stance with core engaged with horiz abdct/ addct with light resistance bells  * balanced standing on yellow noodle -> squats -> squats catching "air"  * tall kneeling on yellow noodle with intermittent UE on wall; cues for stacking shoulders/hips/knees   [facility] Adult PT Treatment:                                                DATE: 06/11/23 Therapeutic Exercise: Child's pose after needling Reviewed and discussed HEP while needling Manual Therapy: Skilled assessment and palpation for TPDN Trigger Point Dry Needling  Subsequent Treatment: Instructions provided previously at initial dry needling treatment.   Patient Verbal Consent Given: Yes Education Handout Provided: Previously Provided Muscles Treated: Rt glut med/max & TFL; Lt thoracic paraspinals T5-8; Lt rhomboids, subscap Electrical Stimulation Performed: No Treatment Response/Outcome: twitch with decr muscle spasm STM & TPR glute med/max, TFL/hip flexors, L thoracic paraspinals, L rhomboids   [facility] Adult PT Treatment:                                                DATE: 06/06/23  Therapeutic Exercise: Hesch self correction- Rt post innom & Lt anterior Bridge with ball squeeze x 5 Manual Therapy: IASTM/ STM to R hamstring.  TPR to R iliacus/ TFL/ rectus femoris Aquatic therapy:  * pendulums - side to side and front to/from back ~5 each improved form * straddling noodle: cycling (with even pull in LEs)  * tall kneeling on square thick noodle with intermittent UE on  wall; tall kneeling on wonder board (too difficult to maintain LEs on it)  * belly on wonder board with UE on rainbow hand floats - opp arm leg gentle lifts (cues to lengthen through chair more than lift) * plank on bench with alternating fire hydrants LEs   Treatment                            06/04/23: Blank lines following charge title = not provided on this treatment date.   Manual:  TPDN  YES  Trigger Point Dry Needling  Subsequent Treatment: Instructions provided previously at initial dry needling treatment.   Patient Verbal Consent Given: Yes Education Handout Provided: Previously Provided Muscles Treated: Rt glut med/max & TFL; Lt thoracic paraspinals T5-8; Lt rhomboids, subscap Electrical Stimulation Performed: No Treatment Response/Outcome: twitch with decr muscle spasm Post rot of Lt innom IASTM incision on right hip prone rib mobs There-ex: Hooklying add with ab set Supine active hamstring stretch with iso hip flexion from 90/90 There-Act:  Self Care:  Nuro-Re-ed: Standing posture- shifting to Left heel for Lt glut engagement Gait Training:    [facility] Adult PT Treatment:                                                DATE: 05/30/23  Therapeutic Exercise: Hesch self correction- Rt post innom & Lt anterior Bridge with ball squeeze x 5 Manual Therapy: IASTM/ STM to R hamstring, adductor.  TPR to quadratus femoris Aquatic therapy:  * pendulums - side to side and front to/from back ~3-5 each * straddling noodle: cycling (with even pull in LEs) and hip abdct/ addct (not tolerated in R groin) * plank on yellow noodle with alternating fire hydrants LEs   Treatment                            4/1: Blank lines following charge title = not provided on this treatment date.   Manual:  TPDN YES  Trigger Point Dry Needling  Initial Treatment: Pt instructed on Dry Needling rational, procedures, and possible side effects. Pt instructed to expect mild to moderate muscle soreness later in the day and/or into the next day.  Pt instructed in methods to reduce muscle soreness. Pt instructed to continue prescribed HEP. Patient verbalized understanding of these instructions and education.   Had DN in the past at [facility].   Patient Verbal Consent Given: Yes Education Handout Provided: No in pt instructions Muscles Treated: bilateral glut max & med, Rt piriformis, Rt  TFL Electrical Stimulation Performed: No Treatment Response/Outcome: twitch with decreased spasm Post rotation spring mob to Lt innominate There-ex: Hesch self correction- Rt post innom & Lt anterior C sit with lat pull down There-Act:  Self Care:  Nuro-Re-ed: Standing postural alignment- weight to heels and glut set Gait Training:        PATIENT EDUCATION:  Education details:  PT answered pt's questions and educated pt concerning POC, scope of PT, process of PT, and rationale of interventions.  PT also educated pt in objective findings, relevant anatomy, and exercise form.  Person educated: Patient Education method: Explanation, Demonstration, Tactile cues, Verbal cues, Education comprehension: verbalized understanding, returned demonstration, verbal cues required, tactile cues required, and needs further education  HOME EXERCISE PROGRAM:  Access Code: URL: [email or web address] Date: 04/30/2023 Prepared by: Marnie Siren  Exercises - Supine Transversus Abdominis Bracing - Hands on Stomach  - 2 x daily - 7 x weekly - 2 sets - 10 reps - 5 seconds hold - Bent Knee Fallouts  - 1-2 x daily - 7 x weekly - 1-2 sets - 10 reps  ASSESSMENT:  CLINICAL IMPRESSION: 4/23 Pt warmed up on the nu-step for with no increase in symptoms. TPDN was performed in spots noted above. She had a great twitch and improved hip flexion with TPDN and manual therapy to the anterior hip Sh ehad pain with exercises but no increase in pain. She was advised at this time if that is the case that is fine. We just dont want the pain going up. She was given three exercises with cuing on how to progress. Therapy also added in the nu-step for warm-up.     Pt has been primarily performing aquatic therapy with some land therapy.  She presents to PT today for a land based treatment and PT assessed her for a progress note.  Pt continues to c/o of constant pain and states pain limits her  activities.  She continues to have significant pain and states that her pain is in the same locations.  Pt states her HS pain has not improved.  She continues to have  significant pain and difficulty with walking.  She states her function is about the same, but reports a 20% improvement in her daily mobility.  Pt has recently started walking the neighborhood for 30 mins though has increased pain.  Her worst pain has improved from 10/10 to 8/10 and her best pain has not changed.  Pt continues to have gait deficits though has improved quality of gait with reduced limp.  She reports a 7/10 pain while ambulating in the clinic.  Pt demonstrated improved hip ER and abduction AROM and slightly worse flexion AROM.  Pt has weakness in bilat hip abduction.  She demonstrates worse self perceived disability with LEFS decreasing by 6 points.  Pt able was challenged with S/L clams though was able to perform.  She was more uncomfortable with prone hip extension and PT had pt stop after 5 reps.  Pt has met STG's #2,3,6 and partially met STG's #4,5.  PT will plan to transition more to land based therapy and increase frequency.  Pt should benefit from cont skilled PT to address impairments and goals and to improve overall function.      OBJECTIVE IMPAIRMENTS: Abnormal gait, decreased activity tolerance, decreased endurance, decreased mobility, difficulty walking, decreased ROM, decreased strength, hypomobility, and pain.   ACTIVITY LIMITATIONS: lifting, bending, sitting, standing, squatting, stairs, transfers, and locomotion level  PARTICIPATION LIMITATIONS: cleaning, laundry, driving, shopping, and community activity  PERSONAL FACTORS: Time since onset of injury/illness/exacerbation and 3+ comorbidities: R hip labral repair, L ACL repair x 2, cervical fusion x 2  are also affecting patient's functional outcome.   REHAB POTENTIAL: Good  CLINICAL DECISION MAKING: Evolving/moderate complexity  EVALUATION COMPLEXITY:  Moderate   GOALS:  SHORT TERM GOALS: Target date:  05/28/2023   Pt will be independent with HEP for improved pain, strength, tolerance to activity, and function.  Baseline: Goal status: INITIAL  2.  Pt will tolerate aquatic therapy without adverse effects in order to perform exercises in a reduced stress environment and improve functional mobility, tolerance to activity, strength, and pain.  Baseline:  Goal status: MET -05/24/23 Target date:  05/21/2023  3.  Pt's worst pain will be no > than 8/10 for improved tolerance to activity and mobility.  Baseline: 5/10 Goal status: MET - 05/24/23  4.  Pt will report at least a 25% improvement in her daily mobility.   Baseline: 20% Goal status: PARTIALLY MET - 05/24/23 and 4/22  5.  Pt will demonstrate improved quality of gait including improved Wb'ing thru R LE, reduced hip drop, and reduced limp.  Baseline:  Goal status: PARTIALLY MET  4/22  6.  Pt will perform a 6 inch step up with good form and stability without significant pain Baseline:  Goal status: MET -05/24/23 Target date:  06/04/2023  7.  Pt will ambulate with a normalized heel to toe gait pattern without limping.   Goal status:  PROGRESSING  4/22  Target date:  06/18/2023    LONG TERM GOALS: Target date: 08/20/2023   Pt will ambulate extended community distance without significant pain and difficulty.  Baseline:  Goal status: ONGOING  2.  Pt will ascend and descend stairs with a reciprocal gait with a rail.  Baseline:  Goal status: ONGOING  3.  Pt will be able to walk her neighborhood without significant pain. Baseline:  Goal status: In progress -06/13/23  4.  Pt will be able to perform her ADLs and IADLs including household chores without significant pain and difficulty.  Baseline:  Goal status: ONGOING  5.  Pt will squat with symmetrical Wb'ing in bilat LE's and good form without increased pain for improved functional strength.  Baseline:  Goal status:  INITIAL  6.  Pt will demo 4 to 4+/5 strength in R hip abd, 4/5 in R hip flex, and 5/5 in R knee ext for improved performance of and tolerance with functional mobility.  Baseline:  Goal status: INITIAL   PLAN:  PT FREQUENCY:   2-3x/wk  PT DURATION: 9 weeks  PLANNED INTERVENTIONS: [id number]- PT Re-evaluation, 97110-Therapeutic exercises, 97530- Therapeutic activity, 97112- Neuromuscular re-education, 97535- Self Care, [id number]- Manual therapy, [phone]- Gait training, [phone]- Aquatic Therapy, [phone]- Electrical stimulation (unattended), Patient/Family education, Balance training, Stair training, Taping, Dry Needling, Joint mobilization, Spinal mobilization, Scar mobilization, Cryotherapy, and Moist heat  PLAN FOR NEXT SESSION:  Pt to transition to more land based therapy.  Cont with DN and exercises per protocol per pt tolerance.   Herminia Lope Ginette Bradway SPT 06/20/23 8:13 AM

## 2023-06-20 ENCOUNTER — Encounter (HOSPITAL_BASED_OUTPATIENT_CLINIC_OR_DEPARTMENT_OTHER): Payer: Self-pay | Admitting: Physical Therapy

## 2023-06-20 ENCOUNTER — Ambulatory Visit (HOSPITAL_BASED_OUTPATIENT_CLINIC_OR_DEPARTMENT_OTHER): Admitting: Physical Therapy

## 2023-06-20 DIAGNOSIS — M25551 Pain in right hip: Secondary | ICD-10-CM | POA: Diagnosis not present

## 2023-06-20 DIAGNOSIS — M25651 Stiffness of right hip, not elsewhere classified: Secondary | ICD-10-CM

## 2023-06-20 DIAGNOSIS — R262 Difficulty in walking, not elsewhere classified: Secondary | ICD-10-CM

## 2023-06-20 DIAGNOSIS — M6281 Muscle weakness (generalized): Secondary | ICD-10-CM

## 2023-06-20 NOTE — Therapy (Signed)
 OUTPATIENT PHYSICAL THERAPY LOWER EXTREMITY TREATMENT /    Patient Name: [name] MRN: [id number] DOB:12/09/1985, 38 y.o., female Today's Date: 06/21/2023  END OF SESSION:  PT End of Session - 06/20/23 1652     Visit Number 16    Number of Visits 36    Date for PT Re-Evaluation 08/20/23    Authorization Type [employer]    PT Start Time 1623    PT Stop Time 1705    PT Time Calculation (min) 42 min    Activity Tolerance Patient tolerated treatment well    Behavior During Therapy WFL for tasks assessed/performed                    Past Medical History:  Diagnosis Date   ACL graft tear (HCC)    left knee   Allergy     Angio-edema    Anxiety    Complication of anesthesia    woke up during surgery   Depression    Family history of adverse reaction to anesthesia     mom is difficult to put to sleep   IUD (intrauterine device) in place    Knee pain, right    Migraine headache    Sleep paralysis    Ulcerative proctitis (HCC)    Past Surgical History:  Procedure Laterality Date   ANTERIOR CRUCIATE LIGAMENT REPAIR Left 08/10/2014   Procedure: ANTERIOR CRUCIATE LIGAMENT (ACL) REPAIR;  Surgeon: Genevie Kerns, MD;  Location: [facility] [place];  Service: Orthopedics;  Laterality: Left;   HIP SURGERY Right 08/25/2019   PAO surgery [facility]   HIP SURGERY Bilateral 01/05/2021   [facility], had hardware taken out   KNEE ARTHROSCOPY Left 08/10/2014   Procedure: ARTHROSCOPY KNEE DEBRIDEMENT ALLOGRAFT ACL REVISION RECONSTRUCTION;  Surgeon: Genevie Kerns, MD;  Location: [facility];  Service: Orthopedics;  Laterality: Left;   KNEE ARTHROSCOPY W/ ACL RECONSTRUCTION Left 2006   SPINAL FUSION     widom tooth extraction     only had 1 wisdom tooth    Patient Active Problem List   Diagnosis Date Noted   Ulcerative proctitis with rectal bleeding (HCC) 06/16/2020   Bloating 06/16/2020   Chronic migraine without aura without status migrainosus, not  intractable 08/19/2018   Migraine with aura and with status migrainosus, not intractable 08/19/2018   Pain in left shoulder 08/19/2018   Pain in left ankle and joints of left foot 05/09/2018   Pain in left hip 05/09/2018   Cervicalgia 05/09/2018   Chronic rhinitis 11/12/2017   Angioedema 11/12/2017   Allergic reaction 11/12/2017   Chronic sinusitis 11/12/2017   Cervical intraepithelial neoplasia grade 1 10/01/2017   GAD (generalized anxiety disorder) 09/17/2014   ACL graft tear (HCC) 08/10/2014   S/P ACL reconstruction 08/10/2014   Migraine headache 05/15/2010     REFERRING PROVIDER: Allan Ishihara, MD  REFERRING DIAG: [phone] (ICD-10-CM) - Status post hip surgery / s/p arthroscopy of hip  S32.691K (ICD-10-CM) - Other specified fracture of right ischium, subsequent encounter for fracture with nonunion  THERAPY DIAG:  Pain in right hip  Muscle weakness (generalized)  Stiffness of right hip, not elsewhere classified  Difficulty in walking, not elsewhere classified  Rationale for Evaluation and Treatment: Rehabilitation  ONSET DATE: DOS  12/27/2022   SUBJECTIVE:   SUBJECTIVE STATEMENT: Pt is 25 weeks post op.  Pt states she wants to do more.  Pt states the dry needling helped a little.  She denies any soreness with dry  needling.  Pt reports she continues to have the same pain.  Pt states she feels the best today than she has in awhile.        PERTINENT HISTORY: Hx of hip surgeries: -Revision R hip femoroplasty, arthroscopy with labral repair, open internal fixation post pelv ring Fx on 12/27/2022 -R hip arthroscopy with femoroplasty, labral repair, and pelvis osteotomy on 08/24/2019 (PAO)  -L hip labral repair and hip bone osteotomy, femoroplasty on 03/21/20 -hardware removal on 01/05/21 in bilat hips     L ACL repair x 2 in 06 and 2016 GAD Migraines Ulcerative colitis and celiac Mast cell activation syndrome Cervical fusion in 2020 and 2021   PAIN: Are you  having any pain?  Yes  NPRS: 4/10 current, 8/10 worst, 4/10 best  Location:  R anterior and posteror hip, R glute/hamstring Type:  constant  PRECAUTIONS: Other: per surgical protocol, R hip labral repair, L ACL repair x 2, cervical fusion x 2   WEIGHT BEARING RESTRICTIONS:  none indicated on orders  FALLS:  Has patient fallen in last 6 months? No  LIVING ENVIRONMENT: Lives with: lives with their spouse Lives in:  2 story home but moving into a 1 story Stairs: yes   PLOF: Independent  PATIENT GOALS: to be pain-free, improve ROM, improve quality of life    OBJECTIVE:  Note: Objective measures were completed at Evaluation unless otherwise noted.  DIAGNOSTIC FINDINGS: Pelvis x ray on 04/05/23:   Findings/Impression:  Hardware: Right acetabular plate and screw fixation without complication. Alignment: No dislocation. Bones: No acute fracture. Healing osteotomy of the right superior pubic ramus root. Joints: Mild degenerative changes of the left hip. Soft Tissues: Unremarkable.    PATIENT SURVEYS:  LEFS Initial/current:  41/80 / 35/80  COGNITION: Overall cognitive status: Within functional limits for tasks assessed       LOWER EXTREMITY ROM:  Active ROM Right eval Left eval Right 4/22  Hip flexion 105 118 104  Hip extension     Hip abduction 22 30 28   Hip adduction     Hip internal rotation 42 24   Hip external rotation 24 40 32  Knee flexion     Knee extension     Ankle dorsiflexion     Ankle plantarflexion     Ankle inversion     Ankle eversion      (Blank rows = not tested)  LOWER EXTREMITY HHD:  Hip abd (seated):  R:  11.3, L:  16.9   GAIT: Assistive device utilized: None Level of assistance: Complete Independence Comments:  Improved quality of gait with reduced limp though still favors R LE with gait.  Appears to have a R hip drop.  Decreased hip extension.  7/10 pain with ambulation.                                                                                                                                 TREATMENT  DATE:   4/24  Pt performed the bike at home to warm up Therapeutic Exercise: Supine bridge with GTB around thighs 2x10 Supine clamshells red RTB 2x10 S/L clams 2x10 Prone hip extension 2x10 S/L hip abduction x 10   Therapeutic Activities: Retro walking Mini squats 2x10 with UE support  Step ups  x10 each on 4 inch step and on 6 inch step Lateral step ups on 4 inch step 2x10  Manual Therapy:  Pt received STM to L HS in prone.   See below for pt education   4/23 Manual: All PROM performed with distraction to reduce pain and improve movement Trigger Point Dry Needling  Subsequent Treatment: Instructions provided previously at initial dry needling treatment.   Patient Verbal Consent Given: Yes Education Handout Provided: Previously Provided Muscles Treated: TFL, Hamstring, glute medusing a .30x50 needle 2x in each  Electrical Stimulation Performed: No Treatment Response/Outcome: muscle twitch responses and decrease in muscle tightness  Pt education on theracane STM There-ex: Nu-step warm up Supine clamshells red RTB 2x10 Supine marches 2x10 red RTB Bridge with band red 3x10 RTB   Reviewed hpw to use RPE for sets and reps   06/18/23  Reviewed current function, pain levels, and compliance with HEP. Assessed ROM, gait, and strength.  See above. Pt completed LEFS.  S/L clams 2x10 Prone hip extension x 5 bridge x 10, bridge with GTB around thighs x10 Retro walking  See below for pt education.    [facility] Adult PT Treatment:                                                DATE: 06/13/23  Therapeutic Exercise: Hesch self correction- Rt post innom & Lt anterior -2 rounds Bridge with ball squeeze x 5, 2 rounds Manual Therapy: IASTM/ STM to R hamstring, TFL, glute med.  TPR to R iliacus/ rectus femoris Aquatic therapy:  * side step into squat with arm addct/ abdct -> with  yellow hand floats * wide stance with core engaged, reciprocal arm swing with light resistance bells -> staggered stance  * wide stance with core engaged with horiz abdct/ addct with light resistance bells  * balanced standing on yellow noodle -> squats -> squats catching "air"  * tall kneeling on yellow noodle with intermittent UE on wall; cues for stacking shoulders/hips/knees   [facility] Adult PT Treatment:                                                DATE: 06/11/23 Therapeutic Exercise: Child's pose after needling Reviewed and discussed HEP while needling Manual Therapy: Skilled assessment and palpation for TPDN Trigger Point Dry Needling  Subsequent Treatment: Instructions provided previously at initial dry needling treatment.   Patient Verbal Consent Given: Yes Education Handout Provided: Previously Provided Muscles Treated: Rt glut med/max & TFL; Lt thoracic paraspinals T5-8; Lt rhomboids, subscap Electrical Stimulation Performed: No Treatment Response/Outcome: twitch with decr muscle spasm STM & TPR glute med/max, TFL/hip flexors, L thoracic paraspinals, L rhomboids   [facility] Adult PT Treatment:  DATE: 06/06/23  Therapeutic Exercise: Hesch self correction- Rt post innom & Lt anterior Bridge with ball squeeze x 5 Manual Therapy: IASTM/ STM to R hamstring.  TPR to R iliacus/ TFL/ rectus femoris Aquatic therapy:  * pendulums - side to side and front to/from back ~5 each improved form * straddling noodle: cycling (with even pull in LEs)  * tall kneeling on square thick noodle with intermittent UE on wall; tall kneeling on wonder board (too difficult to maintain LEs on it)  * belly on wonder board with UE on rainbow hand floats - opp arm leg gentle lifts (cues to lengthen through chair more than lift) * plank on bench with alternating fire hydrants LEs   Treatment                            06/04/23: Blank lines following charge title =  not provided on this treatment date.   Manual:  TPDN YES  Trigger Point Dry Needling  Subsequent Treatment: Instructions provided previously at initial dry needling treatment.   Patient Verbal Consent Given: Yes Education Handout Provided: Previously Provided Muscles Treated: Rt glut med/max & TFL; Lt thoracic paraspinals T5-8; Lt rhomboids, subscap Electrical Stimulation Performed: No Treatment Response/Outcome: twitch with decr muscle spasm Post rot of Lt innom IASTM incision on right hip prone rib mobs There-ex: Hooklying add with ab set Supine active hamstring stretch with iso hip flexion from 90/90 There-Act:  Self Care:  Nuro-Re-ed: Standing posture- shifting to Left heel for Lt glut engagement Gait Training:    [facility] Adult PT Treatment:                                                DATE: 05/30/23  Therapeutic Exercise: Hesch self correction- Rt post innom & Lt anterior Bridge with ball squeeze x 5 Manual Therapy: IASTM/ STM to R hamstring, adductor.  TPR to quadratus femoris Aquatic therapy:  * pendulums - side to side and front to/from back ~3-5 each * straddling noodle: cycling (with even pull in LEs) and hip abdct/ addct (not tolerated in R groin) * plank on yellow noodle with alternating fire hydrants LEs     PATIENT EDUCATION:  Education details:  PT answered pt's questions and educated pt concerning POC, duration of treatment, scope of PT, rationale of exercises and exercise volume/intensity, and rationale of interventions.  PT also educated pt in relevant anatomy, sx response, and exercise form.  Person educated: Patient Education method: Explanation, Demonstration, Tactile cues, Verbal cues, Education comprehension: verbalized understanding, returned demonstration, verbal cues required, tactile cues required, and needs further education  HOME EXERCISE PROGRAM: Access Code: URL: [email or web address] Date: 04/30/2023 Prepared by:  Marnie Siren  Exercises - Supine Transversus Abdominis Bracing - Hands on Stomach  - 2 x daily - 7 x weekly - 2 sets - 10 reps - 5 seconds hold - Bent Knee Fallouts  - 1-2 x daily - 7 x weekly - 1-2 sets - 10 reps  ASSESSMENT:  CLINICAL IMPRESSION: Pt presents to PT for a land based treatment.  Pt performed therapeutic exercise and therapeutic activities to improve LE strength, stability, pain, and functional mobility.  She performed exercises well with cuing and instruction in correct form.  Pt had improved tolerance with exercises today compared to Tuesday's treatment.  Pt was  able to perform S/L clams with improve ease.  She was also able to perform prone hip extension.  PT had pt perform closed chain activities also which she tolerated well.  PT answered pt's questions including educating pt concerning rationale of exercises/interventions, duration of PT treatments, and exercise intensity/volume in relation to sx response/pt tolerance.  Pt responded well to Rx having no increased pain after Rx.  Pt should benefit from cont skilled PT to address impairments and goals and to improve overall function.     OBJECTIVE IMPAIRMENTS: Abnormal gait, decreased activity tolerance, decreased endurance, decreased mobility, difficulty walking, decreased ROM, decreased strength, hypomobility, and pain.   ACTIVITY LIMITATIONS: lifting, bending, sitting, standing, squatting, stairs, transfers, and locomotion level  PARTICIPATION LIMITATIONS: cleaning, laundry, driving, shopping, and community activity  PERSONAL FACTORS: Time since onset of injury/illness/exacerbation and 3+ comorbidities: R hip labral repair, L ACL repair x 2, cervical fusion x 2  are also affecting patient's functional outcome.   REHAB POTENTIAL: Good  CLINICAL DECISION MAKING: Evolving/moderate complexity  EVALUATION COMPLEXITY: Moderate   GOALS:  SHORT TERM GOALS: Target date:  05/28/2023   Pt will be independent with HEP for  improved pain, strength, tolerance to activity, and function.  Baseline: Goal status: INITIAL  2.  Pt will tolerate aquatic therapy without adverse effects in order to perform exercises in a reduced stress environment and improve functional mobility, tolerance to activity, strength, and pain.  Baseline:  Goal status: MET -05/24/23 Target date:  05/21/2023   3.  Pt's worst pain will be no > than 8/10 for improved tolerance to activity and mobility.  Baseline: 5/10 Goal status: MET - 05/24/23  4.  Pt will report at least a 25% improvement in her daily mobility.   Baseline: 20% Goal status: PARTIALLY MET - 05/24/23 and 4/22  5.  Pt will demonstrate improved quality of gait including improved Wb'ing thru R LE, reduced hip drop, and reduced limp.  Baseline:  Goal status: PARTIALLY MET  4/22  6.  Pt will perform a 6 inch step up with good form and stability without significant pain Baseline:  Goal status: MET -05/24/23 Target date:  06/04/2023  7.  Pt will ambulate with a normalized heel to toe gait pattern without limping.   Goal status:  PROGRESSING  4/22  Target date:  06/18/2023    LONG TERM GOALS: Target date: 08/20/2023   Pt will ambulate extended community distance without significant pain and difficulty.  Baseline:  Goal status: ONGOING  2.  Pt will ascend and descend stairs with a reciprocal gait with a rail.  Baseline:  Goal status: ONGOING  3.  Pt will be able to walk her neighborhood without significant pain. Baseline:  Goal status: In progress -06/13/23  4.  Pt will be able to perform her ADLs and IADLs including household chores without significant pain and difficulty.  Baseline:  Goal status: ONGOING  5.  Pt will squat with symmetrical Wb'ing in bilat LE's and good form without increased pain for improved functional strength.  Baseline:  Goal status: INITIAL  6.  Pt will demo 4 to 4+/5 strength in R hip abd, 4/5 in R hip flex, and 5/5 in R knee ext for improved  performance of and tolerance with functional mobility.  Baseline:  Goal status: INITIAL   PLAN:  PT FREQUENCY:   2-3x/wk  PT DURATION: 9 weeks  PLANNED INTERVENTIONS: 97164- PT Re-evaluation, 97110-Therapeutic exercises, 97530- Therapeutic activity, [id number]- Neuromuscular re-education, 97535- Self Care, [id number]- Manual  therapy, [id number]- Gait training, [id number]- Aquatic Therapy, [phone]- Electrical stimulation (unattended), Patient/Family education, Balance training, Stair training, Taping, Dry Needling, Joint mobilization, Spinal mobilization, Scar mobilization, Cryotherapy, and Moist heat  PLAN FOR NEXT SESSION:  Pt to transition to more land based therapy.  Cont with DN and exercises per protocol per pt tolerance.   Trina Fujita III PT, DPT 06/21/23 10:24 PM

## 2023-06-25 ENCOUNTER — Ambulatory Visit (HOSPITAL_BASED_OUTPATIENT_CLINIC_OR_DEPARTMENT_OTHER): Admitting: Physical Therapy

## 2023-06-25 DIAGNOSIS — M6281 Muscle weakness (generalized): Secondary | ICD-10-CM

## 2023-06-25 DIAGNOSIS — R262 Difficulty in walking, not elsewhere classified: Secondary | ICD-10-CM

## 2023-06-25 DIAGNOSIS — M25551 Pain in right hip: Secondary | ICD-10-CM | POA: Diagnosis not present

## 2023-06-25 DIAGNOSIS — M25651 Stiffness of right hip, not elsewhere classified: Secondary | ICD-10-CM

## 2023-06-25 NOTE — Therapy (Signed)
 OUTPATIENT PHYSICAL THERAPY LOWER EXTREMITY TREATMENT /    Patient Name: [name] MRN: [id number] DOB:[birth year], 38 y.o., female Today's Date: 06/26/2023  END OF SESSION:  PT End of Session - 06/25/23 1547     Visit Number 17    Number of Visits 36    Date for PT Re-Evaluation 08/20/23    Authorization Type U[employer]    PT Start Time 1539    PT Stop Time 1625    PT Time Calculation (min) 46 min    Activity Tolerance Patient tolerated treatment well    Behavior During Therapy WFL for tasks assessed/performed                     Past Medical History:  Diagnosis Date   ACL graft tear (HCC)    left knee   Allergy     Angio-edema    Anxiety    Complication of anesthesia    woke up during surgery   Depression    Family history of adverse reaction to anesthesia     mom is difficult to put to sleep   IUD (intrauterine device) in place    Knee pain, right    Migraine headache    Sleep paralysis    Ulcerative proctitis (HCC)    Past Surgical History:  Procedure Laterality Date   ANTERIOR CRUCIATE LIGAMENT REPAIR Left 08/10/2014   Procedure: ANTERIOR CRUCIATE LIGAMENT (ACL) REPAIR;  Surgeon: Genevie Kerns, MD;  Location: [facility] [place];  Service: Orthopedics;  Laterality: Left;   HIP SURGERY Right 08/25/2019   PAO surgery [facility]   HIP SURGERY Bilateral 01/05/2021   [facility], had hardware taken out   KNEE ARTHROSCOPY Left 08/10/2014   Procedure: ARTHROSCOPY KNEE DEBRIDEMENT ALLOGRAFT ACL REVISION RECONSTRUCTION;  Surgeon: Genevie Kerns, MD;  Location: [facility];  Service: Orthopedics;  Laterality: Left;   KNEE ARTHROSCOPY W/ ACL RECONSTRUCTION Left 2006   SPINAL FUSION     widom tooth extraction     only had 1 wisdom tooth    Patient Active Problem List   Diagnosis Date Noted   Ulcerative proctitis with rectal bleeding (HCC) 06/16/2020   Bloating 06/16/2020   Chronic migraine without aura without status migrainosus, not  intractable 08/19/2018   Migraine with aura and with status migrainosus, not intractable 08/19/2018   Pain in left shoulder 08/19/2018   Pain in left ankle and joints of left foot 05/09/2018   Pain in left hip 05/09/2018   Cervicalgia 05/09/2018   Chronic rhinitis 11/12/2017   Angioedema 11/12/2017   Allergic reaction 11/12/2017   Chronic sinusitis 11/12/2017   Cervical intraepithelial neoplasia grade 1 10/01/2017   GAD (generalized anxiety disorder) 09/17/2014   ACL graft tear (HCC) 08/10/2014   S/P ACL reconstruction 08/10/2014   Migraine headache 05/15/2010     REFERRING PROVIDER: Allan Ishihara, MD  REFERRING DIAG: [phone] (ICD-10-CM) - Status post hip surgery / s/p arthroscopy of hip  S32.691K (ICD-10-CM) - Other specified fracture of right ischium, subsequent encounter for fracture with nonunion  THERAPY DIAG:  Pain in right hip  Muscle weakness (generalized)  Stiffness of right hip, not elsewhere classified  Difficulty in walking, not elsewhere classified  Rationale for Evaluation and Treatment: Rehabilitation  ONSET DATE: DOS  12/27/2022   SUBJECTIVE:   SUBJECTIVE STATEMENT: Pt is 25 weeks and 6 days post op.  Pt felt fine after prior Rx.  Pt states her pain is now a constant 5/10 in HS and  anterior hip instead of the constant 7/10.  Pt takes smaller steps with walking to decrease pulling at her hip.  Pt states at the end of last week, she noticed less pain.        PERTINENT HISTORY: Hx of hip surgeries: -Revision R hip femoroplasty, arthroscopy with labral repair, open internal fixation post pelv ring Fx on 12/27/2022 -R hip arthroscopy with femoroplasty, labral repair, and pelvis osteotomy on 08/24/2019 (PAO)  -L hip labral repair and hip bone osteotomy, femoroplasty on 03/21/20 -hardware removal on 01/05/21 in bilat hips     L ACL repair x 2 in 06 and 2016 GAD Migraines Ulcerative colitis and celiac Mast cell activation syndrome Cervical fusion in  2020 and 2021   PAIN: Are you having any pain?  Yes  NPRS: 4/10 current, 8/10 worst, 4/10 best  Location:  R anterior and posteror hip, R glute/hamstring Type:  constant  PRECAUTIONS: Other: per surgical protocol, R hip labral repair, L ACL repair x 2, cervical fusion x 2   WEIGHT BEARING RESTRICTIONS:  none indicated on orders  FALLS:  Has patient fallen in last 6 months? No  LIVING ENVIRONMENT: Lives with: lives with their spouse Lives in:  2 story home but moving into a 1 story Stairs: yes   PLOF: Independent  PATIENT GOALS: to be pain-free, improve ROM, improve quality of life    OBJECTIVE:  Note: Objective measures were completed at Evaluation unless otherwise noted.  DIAGNOSTIC FINDINGS: Pelvis x ray on 04/05/23:   Findings/Impression:  Hardware: Right acetabular plate and screw fixation without complication. Alignment: No dislocation. Bones: No acute fracture. Healing osteotomy of the right superior pubic ramus root. Joints: Mild degenerative changes of the left hip. Soft Tissues: Unremarkable.    PATIENT SURVEYS:  LEFS Initial/current:  41/80 / 35/80  COGNITION: Overall cognitive status: Within functional limits for tasks assessed       LOWER EXTREMITY ROM:  Active ROM Right eval Left eval Right 4/22  Hip flexion 105 118 104  Hip extension     Hip abduction 22 30 28   Hip adduction     Hip internal rotation 42 24   Hip external rotation 24 40 32  Knee flexion     Knee extension     Ankle dorsiflexion     Ankle plantarflexion     Ankle inversion     Ankle eversion      (Blank rows = not tested)  LOWER EXTREMITY HHD:  Hip abd (seated):  R:  11.3, L:  16.9   GAIT: Assistive device utilized: None Level of assistance: Complete Independence Comments:  Improved quality of gait with reduced limp though still favors R LE with gait.  Appears to have a R hip drop.  Decreased hip extension.  7/10 pain with ambulation.  TREATMENT DATE:   4/29  Therapeutic Exercise: Upright bike x 5 mins at L1-2 Supine bridge with GTB around thighs 2x10 Supine bridge with march  S/L clams approx 20   See below for pt education  Therapeutic Activities: Retro walking Mini squats 2x10-12 without UE support  Step ups  x12-15 each on 6 inch step and on 8 inch step Lateral step ups approx 12-15 reps each on 4 inch and 6 inch step      PATIENT EDUCATION:  Education details:  PT answered pt's questions and educated pt concerning POC, duration of treatment, scope of PT, rationale of exercises and exercise volume/intensity, and rationale of interventions.  PT also educated pt in relevant anatomy, sx response, and exercise form.  Person educated: Patient Education method: Explanation, Demonstration, Tactile cues, Verbal cues, Education comprehension: verbalized understanding, returned demonstration, verbal cues required, tactile cues required, and needs further education  HOME EXERCISE PROGRAM: Access Code: URL: [email or web address] Date: 04/30/2023 Prepared by: Marnie Siren   ASSESSMENT:  CLINICAL IMPRESSION: Pt presents to treatment reporting some improvements in her pain levels.  She continues to have pain in the same locations though reports reduced constant pain. PT progressed exercises with increasing intensity of land based treatment.  Pt able to perform step ups, fwd and lateral, on a higher step today without c/o's.  She performed exercises well with cuing and instruction in correct form.  Pt had good tolerance with exercises today.  Pt responded well to Rx having no increased pain after Rx.  Pt should benefit from cont skilled PT to address impairments and goals and to improve overall function.      OBJECTIVE IMPAIRMENTS: Abnormal gait, decreased activity tolerance, decreased  endurance, decreased mobility, difficulty walking, decreased ROM, decreased strength, hypomobility, and pain.   ACTIVITY LIMITATIONS: lifting, bending, sitting, standing, squatting, stairs, transfers, and locomotion level  PARTICIPATION LIMITATIONS: cleaning, laundry, driving, shopping, and community activity  PERSONAL FACTORS: Time since onset of injury/illness/exacerbation and 3+ comorbidities: R hip labral repair, L ACL repair x 2, cervical fusion x 2  are also affecting patient's functional outcome.   REHAB POTENTIAL: Good  CLINICAL DECISION MAKING: Evolving/moderate complexity  EVALUATION COMPLEXITY: Moderate   GOALS:  SHORT TERM GOALS: Target date:  05/28/2023   Pt will be independent with HEP for improved pain, strength, tolerance to activity, and function.  Baseline: Goal status: INITIAL  2.  Pt will tolerate aquatic therapy without adverse effects in order to perform exercises in a reduced stress environment and improve functional mobility, tolerance to activity, strength, and pain.  Baseline:  Goal status: MET -05/24/23 Target date:  05/21/2023   3.  Pt's worst pain will be no > than 8/10 for improved tolerance to activity and mobility.  Baseline: 5/10 Goal status: MET - 05/24/23  4.  Pt will report at least a 25% improvement in her daily mobility.   Baseline: 20% Goal status: PARTIALLY MET - 05/24/23 and 4/22  5.  Pt will demonstrate improved quality of gait including improved Wb'ing thru R LE, reduced hip drop, and reduced limp.  Baseline:  Goal status: PARTIALLY MET  4/22  6.  Pt will perform a 6 inch step up with good form and stability without significant pain Baseline:  Goal status: MET -05/24/23 Target date:  06/04/2023  7.  Pt will ambulate with a normalized heel to toe gait pattern without limping.   Goal status:  PROGRESSING  4/22  Target date:  06/18/2023    LONG TERM GOALS:  Target date: 08/20/2023   Pt will ambulate extended community distance without  significant pain and difficulty.  Baseline:  Goal status: ONGOING  2.  Pt will ascend and descend stairs with a reciprocal gait with a rail.  Baseline:  Goal status: ONGOING  3.  Pt will be able to walk her neighborhood without significant pain. Baseline:  Goal status: In progress -06/13/23  4.  Pt will be able to perform her ADLs and IADLs including household chores without significant pain and difficulty.  Baseline:  Goal status: ONGOING  5.  Pt will squat with symmetrical Wb'ing in bilat LE's and good form without increased pain for improved functional strength.  Baseline:  Goal status: INITIAL  6.  Pt will demo 4 to 4+/5 strength in R hip abd, 4/5 in R hip flex, and 5/5 in R knee ext for improved performance of and tolerance with functional mobility.  Baseline:  Goal status: INITIAL   PLAN:  PT FREQUENCY:   2-3x/wk  PT DURATION: 9 weeks  PLANNED INTERVENTIONS: 97164- PT Re-evaluation, 97110-Therapeutic exercises, 97530- Therapeutic activity, 97112- Neuromuscular re-education, 97535- Self Care, 29528- Manual therapy, [phone]- Gait training, [phone]- Aquatic Therapy, [phone]- Electrical stimulation (unattended), Patient/Family education, Balance training, Stair training, Taping, Dry Needling, Joint mobilization, Spinal mobilization, Scar mobilization, Cryotherapy, and Moist heat  PLAN FOR NEXT SESSION:  Pt to transition to more land based therapy.  Cont with DN and exercises per protocol per pt tolerance.  Add qped birddog and Modified plank if tolerable  Trina Fujita III PT, DPT 06/26/23 10:20 PM

## 2023-06-26 ENCOUNTER — Encounter (HOSPITAL_BASED_OUTPATIENT_CLINIC_OR_DEPARTMENT_OTHER): Payer: Self-pay | Admitting: Physical Therapy

## 2023-06-27 ENCOUNTER — Ambulatory Visit (HOSPITAL_BASED_OUTPATIENT_CLINIC_OR_DEPARTMENT_OTHER): Attending: Orthopedic Surgery | Admitting: Physical Therapy

## 2023-06-27 ENCOUNTER — Encounter (HOSPITAL_BASED_OUTPATIENT_CLINIC_OR_DEPARTMENT_OTHER): Payer: Self-pay | Admitting: Physical Therapy

## 2023-06-27 DIAGNOSIS — M25651 Stiffness of right hip, not elsewhere classified: Secondary | ICD-10-CM | POA: Diagnosis present

## 2023-06-27 DIAGNOSIS — M6281 Muscle weakness (generalized): Secondary | ICD-10-CM | POA: Diagnosis present

## 2023-06-27 DIAGNOSIS — M25551 Pain in right hip: Secondary | ICD-10-CM

## 2023-06-27 DIAGNOSIS — R262 Difficulty in walking, not elsewhere classified: Secondary | ICD-10-CM | POA: Diagnosis present

## 2023-06-27 NOTE — Therapy (Signed)
 OUTPATIENT PHYSICAL THERAPY LOWER EXTREMITY TREATMENT /    Patient Name: [name] MRN: [id number] DOB:Nov 04, 1985, 38 y.o., female Today's Date: 06/27/2023  END OF SESSION:  PT End of Session - 06/27/23 1624     Visit Number 18    Number of Visits 36    Date for PT Re-Evaluation 08/20/23    Authorization Type UHC    PT Start Time 1620    PT Stop Time 1703    PT Time Calculation (min) 43 min    Activity Tolerance Patient tolerated treatment well    Behavior During Therapy WFL for tasks assessed/performed                     Past Medical History:  Diagnosis Date   ACL graft tear (HCC)    left knee   Allergy     Angio-edema    Anxiety    Complication of anesthesia    woke up during surgery   Depression    Family history of adverse reaction to anesthesia     mom is difficult to put to sleep   IUD (intrauterine device) in place    Knee pain, right    Migraine headache    Sleep paralysis    Ulcerative proctitis (HCC)    Past Surgical History:  Procedure Laterality Date   ANTERIOR CRUCIATE LIGAMENT REPAIR Left 08/10/2014   Procedure: ANTERIOR CRUCIATE LIGAMENT (ACL) REPAIR;  Surgeon: Genevie Kerns, MD;  Location: [facility] [place];  Service: Orthopedics;  Laterality: Left;   HIP SURGERY Right 08/25/2019   PAO surgery [facility]   HIP SURGERY Bilateral 01/05/2021   [facility], had hardware taken out   KNEE ARTHROSCOPY Left 08/10/2014   Procedure: ARTHROSCOPY KNEE DEBRIDEMENT ALLOGRAFT ACL REVISION RECONSTRUCTION;  Surgeon: Genevie Kerns, MD;  Location: [facility];  Service: Orthopedics;  Laterality: Left;   KNEE ARTHROSCOPY W/ ACL RECONSTRUCTION Left 2006   SPINAL FUSION     widom tooth extraction     only had 1 wisdom tooth    Patient Active Problem List   Diagnosis Date Noted   Ulcerative proctitis with rectal bleeding (HCC) 06/16/2020   Bloating 06/16/2020   Chronic migraine without aura without status migrainosus, not  intractable 08/19/2018   Migraine with aura and with status migrainosus, not intractable 08/19/2018   Pain in left shoulder 08/19/2018   Pain in left ankle and joints of left foot 05/09/2018   Pain in left hip 05/09/2018   Cervicalgia 05/09/2018   Chronic rhinitis 11/12/2017   Angioedema 11/12/2017   Allergic reaction 11/12/2017   Chronic sinusitis 11/12/2017   Cervical intraepithelial neoplasia grade 1 10/01/2017   GAD (generalized anxiety disorder) 09/17/2014   ACL graft tear (HCC) 08/10/2014   S/P ACL reconstruction 08/10/2014   Migraine headache 05/15/2010     REFERRING PROVIDER: Allan Ishihara, MD  REFERRING DIAG: [phone] (ICD-10-CM) - Status post hip surgery / s/p arthroscopy of hip  S32.691K (ICD-10-CM) - Other specified fracture of right ischium, subsequent encounter for fracture with nonunion  THERAPY DIAG:  Pain in right hip  Muscle weakness (generalized)  Stiffness of right hip, not elsewhere classified  Difficulty in walking, not elsewhere classified  Rationale for Evaluation and Treatment: Rehabilitation  ONSET DATE: DOS  12/27/2022   SUBJECTIVE:   SUBJECTIVE STATEMENT: Pt is 26 weeks and 1 day post op.  Pt states she had too much gluten last night which can increase her pain.  Pt states she felt  ok after prior Rx and had no adverse effects after prior Rx.  Pt takes smaller steps with walking to decrease pulling at her hip.          PERTINENT HISTORY: Hx of hip surgeries: -Revision R hip femoroplasty, arthroscopy with labral repair, open internal fixation post pelv ring Fx on 12/27/2022 -R hip arthroscopy with femoroplasty, labral repair, and pelvis osteotomy on 08/24/2019 (PAO)  -L hip labral repair and hip bone osteotomy, femoroplasty on 03/21/20 -hardware removal on 01/05/21 in bilat hips     L ACL repair x 2 in 06 and 2016 GAD Migraines Ulcerative colitis and celiac Mast cell activation syndrome Cervical fusion in 2020 and 2021   PAIN:  Are you having any pain?  Yes  NPRS: 4/10 current, 8/10 worst, 4/10 best  Location:  R anterior and posteror hip, R glute/hamstring Type:  constant  PRECAUTIONS: Other: per surgical protocol, R hip labral repair, L ACL repair x 2, cervical fusion x 2   WEIGHT BEARING RESTRICTIONS:  none indicated on orders  FALLS:  Has patient fallen in last 6 months? No  LIVING ENVIRONMENT: Lives with: lives with their spouse Lives in:  2 story home but moving into a 1 story Stairs: yes   PLOF: Independent  PATIENT GOALS: to be pain-free, improve ROM, improve quality of life    OBJECTIVE:  Note: Objective measures were completed at Evaluation unless otherwise noted.  DIAGNOSTIC FINDINGS: Pelvis x ray on 04/05/23:   Findings/Impression:  Hardware: Right acetabular plate and screw fixation without complication. Alignment: No dislocation. Bones: No acute fracture. Healing osteotomy of the right superior pubic ramus root. Joints: Mild degenerative changes of the left hip. Soft Tissues: Unremarkable.    PATIENT SURVEYS:  LEFS Initial/current:  41/80 / 35/80  COGNITION: Overall cognitive status: Within functional limits for tasks assessed       LOWER EXTREMITY ROM:  Active ROM Right eval Left eval Right 4/22  Hip flexion 105 118 104  Hip extension     Hip abduction 22 30 28   Hip adduction     Hip internal rotation 42 24   Hip external rotation 24 40 32  Knee flexion     Knee extension     Ankle dorsiflexion     Ankle plantarflexion     Ankle inversion     Ankle eversion      (Blank rows = not tested)  LOWER EXTREMITY HHD:  Hip abd (seated):  R:  11.3, L:  16.9   GAIT: Assistive device utilized: None Level of assistance: Complete Independence Comments:  Improved quality of gait with reduced limp though still favors R LE with gait.  Appears to have a R hip drop.  Decreased hip extension.  7/10 pain with ambulation.  TREATMENT DATE:   5/1  Therapeutic Exercise: Upright bike x 6 mins at L1-2 Supine bridge with march 2x10 Qped birddogs x10 Modified planks 3x20 sec  See below for pt education  Therapeutic Activities: Retro walking Mini squats 2x10-12 without UE support  Step ups  2x10-12 on 8 inch step Lateral step ups 2x10 on 6 inch step      PATIENT EDUCATION:  Education details:  POC, rationale of exercises and exercise volume/intensity, rationale of interventions, relevant anatomy, sx response, and exercise form.  Person educated: Patient Education method: Explanation, Demonstration, Tactile cues, Verbal cues, Education comprehension: verbalized understanding, returned demonstration, verbal cues required, tactile cues required, and needs further education  HOME EXERCISE PROGRAM: Access Code: URL: [email or web address] Date: 04/30/2023 Prepared by: Marnie Siren   ASSESSMENT:  CLINICAL IMPRESSION: Pt seems to be responding well to land based treatments.  PT is progressing land based exercises and pt is tolerating the exercises well.  She performed therapeutic exercises and therapeutic activities well with cuing and instruction in correct form and positioning.  Pt responded well to Rx having no increased pain and states she can feel some tightness in hip adductors after Rx.  She should benefit from cont skilled PT to address impairments and goals and to improve overall function.     OBJECTIVE IMPAIRMENTS: Abnormal gait, decreased activity tolerance, decreased endurance, decreased mobility, difficulty walking, decreased ROM, decreased strength, hypomobility, and pain.   ACTIVITY LIMITATIONS: lifting, bending, sitting, standing, squatting, stairs, transfers, and locomotion level  PARTICIPATION LIMITATIONS: cleaning, laundry, driving, shopping, and community activity  PERSONAL FACTORS:  Time since onset of injury/illness/exacerbation and 3+ comorbidities: R hip labral repair, L ACL repair x 2, cervical fusion x 2  are also affecting patient's functional outcome.   REHAB POTENTIAL: Good  CLINICAL DECISION MAKING: Evolving/moderate complexity  EVALUATION COMPLEXITY: Moderate   GOALS:  SHORT TERM GOALS: Target date:  05/28/2023   Pt will be independent with HEP for improved pain, strength, tolerance to activity, and function.  Baseline: Goal status: INITIAL  2.  Pt will tolerate aquatic therapy without adverse effects in order to perform exercises in a reduced stress environment and improve functional mobility, tolerance to activity, strength, and pain.  Baseline:  Goal status: MET -05/24/23 Target date:  05/21/2023   3.  Pt's worst pain will be no > than 8/10 for improved tolerance to activity and mobility.  Baseline: 5/10 Goal status: MET - 05/24/23  4.  Pt will report at least a 25% improvement in her daily mobility.   Baseline: 20% Goal status: PARTIALLY MET - 05/24/23 and 4/22  5.  Pt will demonstrate improved quality of gait including improved Wb'ing thru R LE, reduced hip drop, and reduced limp.  Baseline:  Goal status: PARTIALLY MET  4/22  6.  Pt will perform a 6 inch step up with good form and stability without significant pain Baseline:  Goal status: MET -05/24/23 Target date:  06/04/2023  7.  Pt will ambulate with a normalized heel to toe gait pattern without limping.   Goal status:  PROGRESSING  4/22  Target date:  06/18/2023    LONG TERM GOALS: Target date: 08/20/2023   Pt will ambulate extended community distance without significant pain and difficulty.  Baseline:  Goal status: ONGOING  2.  Pt will ascend and descend stairs with a reciprocal gait with a rail.  Baseline:  Goal status: ONGOING  3.  Pt will be able to walk her neighborhood without significant pain. Baseline:  Goal status: In progress -06/13/23  4.  Pt will be able to perform  her ADLs and IADLs including household chores without significant pain and difficulty.  Baseline:  Goal status: ONGOING  5.  Pt will squat with symmetrical Wb'ing in bilat LE's and good form without increased pain for improved functional strength.  Baseline:  Goal status: INITIAL  6.  Pt will demo 4 to 4+/5 strength in R hip abd, 4/5 in R hip flex, and 5/5 in R knee ext for improved performance of and tolerance with functional mobility.  Baseline:  Goal status: INITIAL   PLAN:  PT FREQUENCY:   2-3x/wk  PT DURATION: 9 weeks  PLANNED INTERVENTIONS: 97164- PT Re-evaluation, 97110-Therapeutic exercises, 97530- Therapeutic activity, 97112- Neuromuscular re-education, 97535- Self Care, 19147- Manual therapy, [phone]- Gait training, [phone]- Aquatic Therapy, [phone]- Electrical stimulation (unattended), Patient/Family education, Balance training, Stair training, Taping, Dry Needling, Joint mobilization, Spinal mobilization, Scar mobilization, Cryotherapy, and Moist heat  PLAN FOR NEXT SESSION:  Pt to transition to more land based therapy.  Cont with DN and exercises per protocol per pt tolerance.    Trina Fujita III PT, DPT 06/27/23 11:03 PM

## 2023-06-28 ENCOUNTER — Encounter (HOSPITAL_BASED_OUTPATIENT_CLINIC_OR_DEPARTMENT_OTHER): Payer: Self-pay | Admitting: Physical Therapy

## 2023-06-28 ENCOUNTER — Ambulatory Visit (HOSPITAL_BASED_OUTPATIENT_CLINIC_OR_DEPARTMENT_OTHER): Admitting: Physical Therapy

## 2023-06-28 DIAGNOSIS — M25651 Stiffness of right hip, not elsewhere classified: Secondary | ICD-10-CM

## 2023-06-28 DIAGNOSIS — M25551 Pain in right hip: Secondary | ICD-10-CM

## 2023-06-28 DIAGNOSIS — M6281 Muscle weakness (generalized): Secondary | ICD-10-CM

## 2023-06-28 DIAGNOSIS — R262 Difficulty in walking, not elsewhere classified: Secondary | ICD-10-CM

## 2023-06-28 NOTE — Therapy (Signed)
 OUTPATIENT PHYSICAL THERAPY LOWER EXTREMITY TREATMENT /    Patient Name: [name] MRN: [id number] DOB:September 05, 1985, 38 y.o., female Today's Date: 06/29/2023  END OF SESSION:  PT End of Session - 06/28/23 1508     Visit Number 19    Number of Visits 36    Date for PT Re-Evaluation 08/20/23    Authorization Type UHC    PT Start Time 1435    PT Stop Time 1514    PT Time Calculation (min) 39 min    Activity Tolerance Patient tolerated treatment well;No increased pain    Behavior During Therapy WFL for tasks assessed/performed                      Past Medical History:  Diagnosis Date   ACL graft tear (HCC)    left knee   Allergy     Angio-edema    Anxiety    Complication of anesthesia    woke up during surgery   Depression    Family history of adverse reaction to anesthesia     mom is difficult to put to sleep   IUD (intrauterine device) in place    Knee pain, right    Migraine headache    Sleep paralysis    Ulcerative proctitis [facility])    Past Surgical History:  Procedure Laterality Date   ANTERIOR CRUCIATE LIGAMENT REPAIR Left 08/10/2014   Procedure: ANTERIOR CRUCIATE LIGAMENT (ACL) REPAIR;  Surgeon: Genevie Kerns, MD;  Location: [facility];  Service: Orthopedics;  Laterality: Left;   HIP SURGERY Right 08/25/2019   PAO surgery [facility]   HIP SURGERY Bilateral 01/05/2021   [facility], had hardware taken out   KNEE ARTHROSCOPY Left 08/10/2014   Procedure: ARTHROSCOPY KNEE DEBRIDEMENT ALLOGRAFT ACL REVISION RECONSTRUCTION;  Surgeon: Genevie Kerns, MD;  Location: [facility];  Service: Orthopedics;  Laterality: Left;   KNEE ARTHROSCOPY W/ ACL RECONSTRUCTION Left 2006   SPINAL FUSION     widom tooth extraction     only had 1 wisdom tooth    Patient Active Problem List   Diagnosis Date Noted   Ulcerative proctitis with rectal bleeding (HCC) 06/16/2020   Bloating 06/16/2020   Chronic migraine without aura without  status migrainosus, not intractable 08/19/2018   Migraine with aura and with status migrainosus, not intractable 08/19/2018   Pain in left shoulder 08/19/2018   Pain in left ankle and joints of left foot 05/09/2018   Pain in left hip 05/09/2018   Cervicalgia 05/09/2018   Chronic rhinitis 11/12/2017   Angioedema 11/12/2017   Allergic reaction 11/12/2017   Chronic sinusitis 11/12/2017   Cervical intraepithelial neoplasia grade 1 10/01/2017   GAD (generalized anxiety disorder) 09/17/2014   ACL graft tear (HCC) 08/10/2014   S/P ACL reconstruction 08/10/2014   Migraine headache 05/15/2010     REFERRING PROVIDER: Allan Ishihara, MD  REFERRING DIAG: [phone] (ICD-10-CM) - Status post hip surgery / s/p arthroscopy of hip  S32.691K (ICD-10-CM) - Other specified fracture of right ischium, subsequent encounter for fracture with nonunion  THERAPY DIAG:  Pain in right hip  Muscle weakness (generalized)  Difficulty in walking, not elsewhere classified  Stiffness of right hip, not elsewhere classified  Rationale for Evaluation and Treatment: Rehabilitation  ONSET DATE: DOS  12/27/2022   SUBJECTIVE:   SUBJECTIVE STATEMENT: 5/2 Pt reported TPDN to hip region was helpful. Still has pain in the hip and shoulder but is tolerating the exercises well.  PERTINENT HISTORY: Hx of hip surgeries: -Revision R hip femoroplasty, arthroscopy with labral repair, open internal fixation post pelv ring Fx on 12/27/2022 -R hip arthroscopy with femoroplasty, labral repair, and pelvis osteotomy on 08/24/2019 (PAO)  -L hip labral repair and hip bone osteotomy, femoroplasty on 03/21/20 -hardware removal on 01/05/21 in bilat hips     L ACL repair x 2 in 06 and 2016 GAD Migraines Ulcerative colitis and celiac Mast cell activation syndrome Cervical fusion in 2020 and 2021   PAIN: Are you having any pain?  Yes  NPRS: 4/10 current, 8/10 worst, 4/10 best  Location:  R anterior and posteror hip,  R glute/hamstring Type:  constant  PRECAUTIONS: Other: per surgical protocol, R hip labral repair, L ACL repair x 2, cervical fusion x 2   WEIGHT BEARING RESTRICTIONS:  none indicated on orders  FALLS:  Has patient fallen in last 6 months? No  LIVING ENVIRONMENT: Lives with: lives with their spouse Lives in:  2 story home but moving into a 1 story Stairs: yes   PLOF: Independent  PATIENT GOALS: to be pain-free, improve ROM, improve quality of life    OBJECTIVE:  Note: Objective measures were completed at Evaluation unless otherwise noted.  DIAGNOSTIC FINDINGS: Pelvis x ray on 04/05/23:   Findings/Impression:  Hardware: Right acetabular plate and screw fixation without complication. Alignment: No dislocation. Bones: No acute fracture. Healing osteotomy of the right superior pubic ramus root. Joints: Mild degenerative changes of the left hip. Soft Tissues: Unremarkable.    PATIENT SURVEYS:  LEFS Initial/current:  41/80 / 35/80  COGNITION: Overall cognitive status: Within functional limits for tasks assessed       LOWER EXTREMITY ROM:  Active ROM Right eval Left eval Right 4/22  Hip flexion 105 118 104  Hip extension     Hip abduction 22 30 28   Hip adduction     Hip internal rotation 42 24   Hip external rotation 24 40 32  Knee flexion     Knee extension     Ankle dorsiflexion     Ankle plantarflexion     Ankle inversion     Ankle eversion      (Blank rows = not tested)  LOWER EXTREMITY HHD:  Hip abd (seated):  R:  11.3, L:  16.9   GAIT: Assistive device utilized: None Level of assistance: Complete Independence Comments:  Improved quality of gait with reduced limp though still favors R LE with gait.  Appears to have a R hip drop.  Decreased hip extension.  7/10 pain with ambulation.                                                                                                                                TREATMENT DATE:   5/2 Manual:  All PROM performed with distraction to reduce pain and improve movement Trigger Point Dry Needling  Subsequent Treatment: Instructions provided previously at initial dry needling  treatment.   Patient Verbal Consent Given: Yes Education Handout Provided: Previously Provided Muscles Treated: R hamstring, R anterior hip fleor , L UT, L subscapularis Electrical Stimulation Performed: No Treatment Response/Outcome: muscle twitch response''=  Manual: skilled palpation of trigger points. Trigger point release to all trigger points needled   Neuro-re-ed:  Dead lift from box 12 lbs 3x10  Kettle bell swing 3x10 12 lbs  The patient has kettle bells at home.   Last visit  Therapeutic Exercise: Upright bike x 6 mins at L1-2 Supine bridge with march 2x10 Qped birddogs x10 Modified planks 3x20 sec  See below for pt education  Therapeutic Activities: Retro walking Mini squats 2x10-12 without UE support  Step ups  2x10-12 on 8 inch step Lateral step ups 2x10 on 6 inch step      PATIENT EDUCATION:  Education details:  POC, rationale of exercises and exercise volume/intensity, rationale of interventions, relevant anatomy, sx response, and exercise form.  Person educated: Patient Education method: Explanation, Demonstration, Tactile cues, Verbal cues, Education comprehension: verbalized understanding, returned demonstration, verbal cues required, tactile cues required, and needs further education  HOME EXERCISE PROGRAM: Access Code: URL: [email or web address] Date: 04/30/2023 Prepared by: Marnie Siren   ASSESSMENT:  CLINICAL IMPRESSION: 5/2 Pt warmed up on bike for with no increase in symptoms. TPDN was done in [place], subscap region, R hamstring, and anterior shoulder. Pt reported reduction in tightness post TPDN. Pt will continue to benefit from skilled physical therapy to reduce symptom irritability and improve functional strength for return to ADL's and  recreational activities. Therapy reviewed use of kettle bells for home. Therapy discussed the use of RPE for grading the weights of her exercises.      OBJECTIVE IMPAIRMENTS: Abnormal gait, decreased activity tolerance, decreased endurance, decreased mobility, difficulty walking, decreased ROM, decreased strength, hypomobility, and pain.   ACTIVITY LIMITATIONS: lifting, bending, sitting, standing, squatting, stairs, transfers, and locomotion level  PARTICIPATION LIMITATIONS: cleaning, laundry, driving, shopping, and community activity  PERSONAL FACTORS: Time since onset of injury/illness/exacerbation and 3+ comorbidities: R hip labral repair, L ACL repair x 2, cervical fusion x 2  are also affecting patient's functional outcome.   REHAB POTENTIAL: Good  CLINICAL DECISION MAKING: Evolving/moderate complexity  EVALUATION COMPLEXITY: Moderate   GOALS:  SHORT TERM GOALS: Target date:  05/28/2023   Pt will be independent with HEP for improved pain, strength, tolerance to activity, and function.  Baseline: Goal status: INITIAL  2.  Pt will tolerate aquatic therapy without adverse effects in order to perform exercises in a reduced stress environment and improve functional mobility, tolerance to activity, strength, and pain.  Baseline:  Goal status: MET -05/24/23 Target date:  05/21/2023   3.  Pt's worst pain will be no > than 8/10 for improved tolerance to activity and mobility.  Baseline: 5/10 Goal status: MET - 05/24/23  4.  Pt will report at least a 25% improvement in her daily mobility.   Baseline: 20% Goal status: PARTIALLY MET - 05/24/23 and 4/22  5.  Pt will demonstrate improved quality of gait including improved Wb'ing thru R LE, reduced hip drop, and reduced limp.  Baseline:  Goal status: PARTIALLY MET  4/22  6.  Pt will perform a 6 inch step up with good form and stability without significant pain Baseline:  Goal status: MET -05/24/23 Target date:  06/04/2023  7.  Pt will  ambulate with a normalized heel to toe gait pattern without limping.   Goal status:  PROGRESSING  4/22  Target date:  06/18/2023    LONG TERM GOALS: Target date: 08/20/2023   Pt will ambulate extended community distance without significant pain and difficulty.  Baseline:  Goal status: ONGOING  2.  Pt will ascend and descend stairs with a reciprocal gait with a rail.  Baseline:  Goal status: ONGOING  3.  Pt will be able to walk her neighborhood without significant pain. Baseline:  Goal status: In progress -06/13/23  4.  Pt will be able to perform her ADLs and IADLs including household chores without significant pain and difficulty.  Baseline:  Goal status: ONGOING  5.  Pt will squat with symmetrical Wb'ing in bilat LE's and good form without increased pain for improved functional strength.  Baseline:  Goal status: INITIAL  6.  Pt will demo 4 to 4+/5 strength in R hip abd, 4/5 in R hip flex, and 5/5 in R knee ext for improved performance of and tolerance with functional mobility.  Baseline:  Goal status: INITIAL   PLAN:  PT FREQUENCY:   2-3x/wk  PT DURATION: 9 weeks  PLANNED INTERVENTIONS: 97164- PT Re-evaluation, 97110-Therapeutic exercises, 97530- Therapeutic activity, 97112- Neuromuscular re-education, 97535- Self Care, 69629- Manual therapy, [phone]- Gait training, [phone]- Aquatic Therapy, [phone]- Electrical stimulation (unattended), Patient/Family education, Balance training, Stair training, Taping, Dry Needling, Joint mobilization, Spinal mobilization, Scar mobilization, Cryotherapy, and Moist heat  PLAN FOR NEXT SESSION:  Pt to transition to more land based therapy.  Cont with DN and exercises per protocol per pt tolerance.    Herminia Lope Ruthell Feigenbaum SPT 06/29/23 7:24 PM

## 2023-07-01 ENCOUNTER — Ambulatory Visit (HOSPITAL_BASED_OUTPATIENT_CLINIC_OR_DEPARTMENT_OTHER): Admitting: Physical Therapy

## 2023-07-01 ENCOUNTER — Encounter (HOSPITAL_BASED_OUTPATIENT_CLINIC_OR_DEPARTMENT_OTHER): Payer: Self-pay | Admitting: Physical Therapy

## 2023-07-01 DIAGNOSIS — M6281 Muscle weakness (generalized): Secondary | ICD-10-CM

## 2023-07-01 DIAGNOSIS — M25551 Pain in right hip: Secondary | ICD-10-CM

## 2023-07-01 DIAGNOSIS — R262 Difficulty in walking, not elsewhere classified: Secondary | ICD-10-CM

## 2023-07-01 DIAGNOSIS — M25651 Stiffness of right hip, not elsewhere classified: Secondary | ICD-10-CM

## 2023-07-01 NOTE — Therapy (Signed)
 OUTPATIENT PHYSICAL THERAPY LOWER EXTREMITY TREATMENT /    Patient Name: [name] MRN: [id number] DOB:Apr 18, 1985, 38 y.o., female Today's Date: 07/01/2023  END OF SESSION:  PT End of Session - 07/01/23 1320     Visit Number 20    Number of Visits 36    Date for PT Re-Evaluation 08/20/23    Authorization Type UHC    PT Start Time 1320    PT Stop Time 1404    PT Time Calculation (min) 44 min    Activity Tolerance Patient tolerated treatment well;No increased pain    Behavior During Therapy WFL for tasks assessed/performed                       Past Medical History:  Diagnosis Date   ACL graft tear (HCC)    left knee   Allergy     Angio-edema    Anxiety    Complication of anesthesia    woke up during surgery   Depression    Family history of adverse reaction to anesthesia     mom is difficult to put to sleep   IUD (intrauterine device) in place    Knee pain, right    Migraine headache    Sleep paralysis    Ulcerative proctitis [facility])    Past Surgical History:  Procedure Laterality Date   ANTERIOR CRUCIATE LIGAMENT REPAIR Left 08/10/2014   Procedure: ANTERIOR CRUCIATE LIGAMENT (ACL) REPAIR;  Surgeon: Genevie Kerns, MD;  Location: [facility];  Service: Orthopedics;  Laterality: Left;   HIP SURGERY Right 08/25/2019   PAO surgery [facility]   HIP SURGERY Bilateral 01/05/2021   [facility], had hardware taken out   KNEE ARTHROSCOPY Left 08/10/2014   Procedure: ARTHROSCOPY KNEE DEBRIDEMENT ALLOGRAFT ACL REVISION RECONSTRUCTION;  Surgeon: Genevie Kerns, MD;  Location: [facility];  Service: Orthopedics;  Laterality: Left;   KNEE ARTHROSCOPY W/ ACL RECONSTRUCTION Left 2006   SPINAL FUSION     widom tooth extraction     only had 1 wisdom tooth    Patient Active Problem List   Diagnosis Date Noted   Ulcerative proctitis with rectal bleeding (HCC) 06/16/2020   Bloating 06/16/2020   Chronic migraine without aura without  status migrainosus, not intractable 08/19/2018   Migraine with aura and with status migrainosus, not intractable 08/19/2018   Pain in left shoulder 08/19/2018   Pain in left ankle and joints of left foot 05/09/2018   Pain in left hip 05/09/2018   Cervicalgia 05/09/2018   Chronic rhinitis 11/12/2017   Angioedema 11/12/2017   Allergic reaction 11/12/2017   Chronic sinusitis 11/12/2017   Cervical intraepithelial neoplasia grade 1 10/01/2017   GAD (generalized anxiety disorder) 09/17/2014   ACL graft tear (HCC) 08/10/2014   S/P ACL reconstruction 08/10/2014   Migraine headache 05/15/2010     REFERRING PROVIDER: Allan Ishihara, MD  REFERRING DIAG: [phone] (ICD-10-CM) - Status post hip surgery / s/p arthroscopy of hip  S32.691K (ICD-10-CM) - Other specified fracture of right ischium, subsequent encounter for fracture with nonunion  THERAPY DIAG:  Pain in right hip  Muscle weakness (generalized)  Difficulty in walking, not elsewhere classified  Stiffness of right hip, not elsewhere classified  Rationale for Evaluation and Treatment: Rehabilitation  ONSET DATE: DOS  12/27/2022   SUBJECTIVE:   SUBJECTIVE STATEMENT: Past 1.5 wk has been a little better. A little regress in groin and HS in last 2 days, am on period right now. It  feels really tight right now. AC joint hurts.      PERTINENT HISTORY: Hx of hip surgeries: -Revision R hip femoroplasty, arthroscopy with labral repair, open internal fixation post pelv ring Fx on 12/27/2022 -R hip arthroscopy with femoroplasty, labral repair, and pelvis osteotomy on 08/24/2019 (PAO)  -L hip labral repair and hip bone osteotomy, femoroplasty on 03/21/20 -hardware removal on 01/05/21 in bilat hips     L ACL repair x 2 in 06 and 2016 GAD Migraines Ulcerative colitis and celiac Mast cell activation syndrome Cervical fusion in 2020 and 2021   PAIN: Are you having any pain?  Yes  NPRS: 4/10 current, 8/10 worst, 4/10 best   Location:  R anterior and posteror hip, R glute/hamstring Type:  constant  PRECAUTIONS: Other: per surgical protocol, R hip labral repair, L ACL repair x 2, cervical fusion x 2   WEIGHT BEARING RESTRICTIONS:  none indicated on orders  FALLS:  Has patient fallen in last 6 months? No  LIVING ENVIRONMENT: Lives with: lives with their spouse Lives in:  2 story home but moving into a 1 story Stairs: yes   PLOF: Independent  PATIENT GOALS: to be pain-free, improve ROM, improve quality of life    OBJECTIVE:  Note: Objective measures were completed at Evaluation unless otherwise noted.  DIAGNOSTIC FINDINGS: Pelvis x ray on 04/05/23:   Findings/Impression:  Hardware: Right acetabular plate and screw fixation without complication. Alignment: No dislocation. Bones: No acute fracture. Healing osteotomy of the right superior pubic ramus root. Joints: Mild degenerative changes of the left hip. Soft Tissues: Unremarkable.    PATIENT SURVEYS:  LEFS Initial/current:  41/80 / 35/80  COGNITION: Overall cognitive status: Within functional limits for tasks assessed       LOWER EXTREMITY ROM:  Active ROM Right eval Left eval Right 4/22  Hip flexion 105 118 104  Hip extension     Hip abduction 22 30 28   Hip adduction     Hip internal rotation 42 24   Hip external rotation 24 40 32  Knee flexion     Knee extension     Ankle dorsiflexion     Ankle plantarflexion     Ankle inversion     Ankle eversion      (Blank rows = not tested)  LOWER EXTREMITY HHD:  Hip abd (seated):  R:  11.3, L:  16.9   GAIT: Assistive device utilized: None Level of assistance: Complete Independence Comments:  Improved quality of gait with reduced limp though still favors R LE with gait.  Appears to have a R hip drop.  Decreased hip extension.  7/10 pain with ambulation.                                                                                                                                 TREATMENT DATE:   Treatment  5/5: Blank lines following charge title = not provided on this treatment date.   Manual:  TPDN YES  Trigger Point Dry Needling  Subsequent Treatment: Instructions provided previously at initial dry needling treatment.   Patient Verbal Consent Given: Yes Education Handout Provided: Previously Provided Muscles Treated: upper trap, mid deltoid, SCM, subscap, lats, midthoracic paraspinals Electrical Stimulation Performed: No Treatment Response/Outcome: twitch with decreased tension  There-ex:  There-Act: 3-layer heel lift to Rt shoe Self Care:  Nuro-Re-ed: Rt sidelying knee over knee PRI Lt SLS, Rt foot on stool, Lt OH reach, core engagement through Rt obliques.  Gait Training:    5/2 Manual: All PROM performed with distraction to reduce pain and improve movement Trigger Point Dry Needling  Subsequent Treatment: Instructions provided previously at initial dry needling treatment.   Patient Verbal Consent Given: Yes Education Handout Provided: Previously Provided Muscles Treated: R hamstring, R anterior hip fleor , L UT, L subscapularis Electrical Stimulation Performed: No Treatment Response/Outcome: muscle twitch response''=  Manual: skilled palpation of trigger points. Trigger point release to all trigger points needled   Neuro-re-ed:  Dead lift from box 12 lbs 3x10  Kettle bell swing 3x10 12 lbs  The patient has kettle bells at home.   Last visit  Therapeutic Exercise: Upright bike x 6 mins at L1-2 Supine bridge with march 2x10 Qped birddogs x10 Modified planks 3x20 sec  See below for pt education  Therapeutic Activities: Retro walking Mini squats 2x10-12 without UE support  Step ups  2x10-12 on 8 inch step Lateral step ups 2x10 on 6 inch step      PATIENT EDUCATION:  Education details:  POC, rationale of exercises and exercise volume/intensity, rationale of interventions, relevant  anatomy, sx response, and exercise form.  Person educated: Patient Education method: Explanation, Demonstration, Tactile cues, Verbal cues, Education comprehension: verbalized understanding, returned demonstration, verbal cues required, tactile cues required, and needs further education  HOME EXERCISE PROGRAM: Access Code: URL: [email or web address] Date: 04/30/2023 Prepared by: Marnie Siren  3-layer heel lift PRI Rt sidelying knee over knee (handout provided)   ASSESSMENT:  CLINICAL IMPRESSION: Pelvis looked significantly more level in gait with heel lift but demo Lt trendelenburg. Able to notice a change in hamstring tightness with heel lift- asked that she wear the lift as often as possible and will hold on TPDN to determine change of muscle length/tension with level pelvis. Instructed her to remove the lift should concordant pain increase.      OBJECTIVE IMPAIRMENTS: Abnormal gait, decreased activity tolerance, decreased endurance, decreased mobility, difficulty walking, decreased ROM, decreased strength, hypomobility, and pain.   ACTIVITY LIMITATIONS: lifting, bending, sitting, standing, squatting, stairs, transfers, and locomotion level  PARTICIPATION LIMITATIONS: cleaning, laundry, driving, shopping, and community activity  PERSONAL FACTORS: Time since onset of injury/illness/exacerbation and 3+ comorbidities: R hip labral repair, L ACL repair x 2, cervical fusion x 2  are also affecting patient's functional outcome.   REHAB POTENTIAL: Good  CLINICAL DECISION MAKING: Evolving/moderate complexity  EVALUATION COMPLEXITY: Moderate   GOALS:  SHORT TERM GOALS: Target date:  05/28/2023   Pt will be independent with HEP for improved pain, strength, tolerance to activity, and function.  Baseline: Goal status: INITIAL  2.  Pt will tolerate aquatic therapy without adverse effects in order to perform exercises in a reduced stress environment and improve  functional mobility, tolerance to activity, strength, and pain.  Baseline:  Goal status: MET -05/24/23 Target date:  05/21/2023   3.  Pt's worst pain will  be no > than 8/10 for improved tolerance to activity and mobility.  Baseline: 5/10 Goal status: MET - 05/24/23  4.  Pt will report at least a 25% improvement in her daily mobility.   Baseline: 20% Goal status: PARTIALLY MET - 05/24/23 and 4/22  5.  Pt will demonstrate improved quality of gait including improved Wb'ing thru R LE, reduced hip drop, and reduced limp.  Baseline:  Goal status: PARTIALLY MET  4/22  6.  Pt will perform a 6 inch step up with good form and stability without significant pain Baseline:  Goal status: MET -05/24/23 Target date:  06/04/2023  7.  Pt will ambulate with a normalized heel to toe gait pattern without limping.   Goal status:  PROGRESSING  4/22  Target date:  06/18/2023    LONG TERM GOALS: Target date: 08/20/2023   Pt will ambulate extended community distance without significant pain and difficulty.  Baseline:  Goal status: ONGOING  2.  Pt will ascend and descend stairs with a reciprocal gait with a rail.  Baseline:  Goal status: ONGOING  3.  Pt will be able to walk her neighborhood without significant pain. Baseline:  Goal status: In progress -06/13/23  4.  Pt will be able to perform her ADLs and IADLs including household chores without significant pain and difficulty.  Baseline:  Goal status: ONGOING  5.  Pt will squat with symmetrical Wb'ing in bilat LE's and good form without increased pain for improved functional strength.  Baseline:  Goal status: INITIAL  6.  Pt will demo 4 to 4+/5 strength in R hip abd, 4/5 in R hip flex, and 5/5 in R knee ext for improved performance of and tolerance with functional mobility.  Baseline:  Goal status: INITIAL   PLAN:  PT FREQUENCY:   2-3x/wk  PT DURATION: 9 weeks  PLANNED INTERVENTIONS: 97164- PT Re-evaluation, 97110-Therapeutic exercises,  97530- Therapeutic activity, 97112- Neuromuscular re-education, 97535- Self Care, 16109- Manual therapy, [phone]- Gait training, [phone]- Aquatic Therapy, [phone]- Electrical stimulation (unattended), Patient/Family education, Balance training, Stair training, Taping, Dry Needling, Joint mobilization, Spinal mobilization, Scar mobilization, Cryotherapy, and Moist heat  PLAN FOR NEXT SESSION:  Pt to transition to more land based therapy.  Cont with DN and exercises per protocol per pt tolerance.    Akhilesh Sassone C. Cloteal Isaacson PT, DPT 07/01/23 3:04 PM

## 2023-07-03 ENCOUNTER — Ambulatory Visit (HOSPITAL_BASED_OUTPATIENT_CLINIC_OR_DEPARTMENT_OTHER): Admitting: Physical Therapy

## 2023-07-03 DIAGNOSIS — M6281 Muscle weakness (generalized): Secondary | ICD-10-CM

## 2023-07-03 DIAGNOSIS — M25551 Pain in right hip: Secondary | ICD-10-CM | POA: Diagnosis not present

## 2023-07-03 DIAGNOSIS — R262 Difficulty in walking, not elsewhere classified: Secondary | ICD-10-CM

## 2023-07-03 DIAGNOSIS — M25651 Stiffness of right hip, not elsewhere classified: Secondary | ICD-10-CM

## 2023-07-03 NOTE — Therapy (Signed)
 OUTPATIENT PHYSICAL THERAPY LOWER EXTREMITY TREATMENT /    Patient Name: [name] MRN: [id number] DOB:01-Aug-1985, 38 y.o., female Today's Date: 07/04/2023  END OF SESSION:  PT End of Session - 07/03/23 1544     Visit Number 21    Number of Visits 36    Date for PT Re-Evaluation 08/20/23    Authorization Type [employer]    PT Start Time 1542    PT Stop Time 1623    PT Time Calculation (min) 41 min    Activity Tolerance Patient tolerated treatment well;No increased pain    Behavior During Therapy WFL for tasks assessed/performed                        Past Medical History:  Diagnosis Date   ACL graft tear (HCC)    left knee   Allergy     Angio-edema    Anxiety    Complication of anesthesia    woke up during surgery   Depression    Family history of adverse reaction to anesthesia     mom is difficult to put to sleep   IUD (intrauterine device) in place    Knee pain, right    Migraine headache    Sleep paralysis    Ulcerative proctitis [facility])    Past Surgical History:  Procedure Laterality Date   ANTERIOR CRUCIATE LIGAMENT REPAIR Left 08/10/2014   Procedure: ANTERIOR CRUCIATE LIGAMENT (ACL) REPAIR;  Surgeon: Genevie Kerns, MD;  Location: [facility] [place];  Service: Orthopedics;  Laterality: Left;   HIP SURGERY Right 08/25/2019   PAO surgery [facility]   HIP SURGERY Bilateral 01/05/2021   [facility], had hardware taken out   KNEE ARTHROSCOPY Left 08/10/2014   Procedure: ARTHROSCOPY KNEE DEBRIDEMENT ALLOGRAFT ACL REVISION RECONSTRUCTION;  Surgeon: Genevie Kerns, MD;  Location: [facility];  Service: Orthopedics;  Laterality: Left;   KNEE ARTHROSCOPY W/ ACL RECONSTRUCTION Left 2006   SPINAL FUSION     widom tooth extraction     only had 1 wisdom tooth    Patient Active Problem List   Diagnosis Date Noted   Ulcerative proctitis with rectal bleeding (HCC) 06/16/2020   Bloating 06/16/2020   Chronic migraine without aura without  status migrainosus, not intractable 08/19/2018   Migraine with aura and with status migrainosus, not intractable 08/19/2018   Pain in left shoulder 08/19/2018   Pain in left ankle and joints of left foot 05/09/2018   Pain in left hip 05/09/2018   Cervicalgia 05/09/2018   Chronic rhinitis 11/12/2017   Angioedema 11/12/2017   Allergic reaction 11/12/2017   Chronic sinusitis 11/12/2017   Cervical intraepithelial neoplasia grade 1 10/01/2017   GAD (generalized anxiety disorder) 09/17/2014   ACL graft tear (HCC) 08/10/2014   S/P ACL reconstruction 08/10/2014   Migraine headache 05/15/2010     REFERRING PROVIDER: Allan Ishihara, MD  REFERRING DIAG: [phone] (ICD-10-CM) - Status post hip surgery / s/p arthroscopy of hip  S32.691K (ICD-10-CM) - Other specified fracture of right ischium, subsequent encounter for fracture with nonunion  THERAPY DIAG:  Pain in right hip  Muscle weakness (generalized)  Difficulty in walking, not elsewhere classified  Stiffness of right hip, not elsewhere classified  Rationale for Evaluation and Treatment: Rehabilitation  ONSET DATE: DOS  12/27/2022   SUBJECTIVE:   SUBJECTIVE STATEMENT: Pt is 26 weeks and 6 days post op.  Pt reports tightness in R anterior hip and has a muscle/nerve pain in R  glute. Pt has been wearing a 3 layer heel lift on the R side some.  Pt states it feels weird.  Pt saw chiropractor yesterday.      PERTINENT HISTORY: Hx of hip surgeries: -Revision R hip femoroplasty, arthroscopy with labral repair, open internal fixation post pelv ring Fx on 12/27/2022 -R hip arthroscopy with femoroplasty, labral repair, and pelvis osteotomy on 08/24/2019 (PAO)  -L hip labral repair and hip bone osteotomy, femoroplasty on 03/21/20 -hardware removal on 01/05/21 in bilat hips     L ACL repair x 2 in 06 and 2016 GAD Migraines Ulcerative colitis and celiac Mast cell activation syndrome Cervical fusion in 2020 and 2021   PAIN: Are you  having any pain?  Yes  NPRS: 5/10 current, 8/10 worst, 4/10 best  Location:  R anterior and R glute > HS Type:  constant  PRECAUTIONS: Other: per surgical protocol, R hip labral repair, L ACL repair x 2, cervical fusion x 2   WEIGHT BEARING RESTRICTIONS: none indicated on orders  FALLS:  Has patient fallen in last 6 months? No  LIVING ENVIRONMENT: Lives with: lives with their spouse Lives in:  2 story home but moving into a 1 story Stairs: yes   PLOF: Independent  PATIENT GOALS: to be pain-free, improve ROM, improve quality of life    OBJECTIVE:  Note: Objective measures were completed at Evaluation unless otherwise noted.  DIAGNOSTIC FINDINGS: Pelvis x ray on 04/05/23:   Findings/Impression:  Hardware: Right acetabular plate and screw fixation without complication. Alignment: No dislocation. Bones: No acute fracture. Healing osteotomy of the right superior pubic ramus root. Joints: Mild degenerative changes of the left hip. Soft Tissues: Unremarkable.    PATIENT SURVEYS:  LEFS Initial/current:  41/80 / 35/80  COGNITION: Overall cognitive status: Within functional limits for tasks assessed       LOWER EXTREMITY ROM:  Active ROM Right eval Left eval Right 4/22  Hip flexion 105 118 104  Hip extension     Hip abduction 22 30 28   Hip adduction     Hip internal rotation 42 24   Hip external rotation 24 40 32  Knee flexion     Knee extension     Ankle dorsiflexion     Ankle plantarflexion     Ankle inversion     Ankle eversion      (Blank rows = not tested)  LOWER EXTREMITY HHD:  Hip abd (seated):  R:  11.3, L:  16.9   GAIT: Assistive device utilized: None Level of assistance: Complete Independence Comments:  Improved quality of gait with reduced limp though still favors R LE with gait.  Appears to have a R hip drop.  Decreased hip extension.  7/10 pain with ambulation.                                                                                                                                 TREATMENT DATE:  5/7 Therapeutic Exercise: Pt performed Upright bike prior to Rx Supine bridge with march 2x10 Qped birddogs 2x10 Modified planks 1x20 sec, 2x30 sec Lateral band walks with RTB at thighs x 1 lap at rail and with GTB at thighs x 2 laps at rail Retro walking Mini squats 2x10-12 without UE support  Dead lift from box 10 lbs 2x10   See below for pt education   Treatment                            5/5: Blank lines following charge title = not provided on this treatment date.   Manual:  TPDN YES  Trigger Point Dry Needling  Subsequent Treatment: Instructions provided previously at initial dry needling treatment.   Patient Verbal Consent Given: Yes Education Handout Provided: Previously Provided Muscles Treated: upper trap, mid deltoid, SCM, subscap, lats, midthoracic paraspinals Electrical Stimulation Performed: No Treatment Response/Outcome: twitch with decreased tension  There-ex:  There-Act: 3-layer heel lift to Rt shoe Self Care:  Nuro-Re-ed: Rt sidelying knee over knee PRI Lt SLS, Rt foot on stool, Lt OH reach, core engagement through Rt obliques.  Gait Training:    5/2 Manual: All PROM performed with distraction to reduce pain and improve movement Trigger Point Dry Needling  Subsequent Treatment: Instructions provided previously at initial dry needling treatment.   Patient Verbal Consent Given: Yes Education Handout Provided: Previously Provided Muscles Treated: R hamstring, R anterior hip fleor , L UT, L subscapularis Electrical Stimulation Performed: No Treatment Response/Outcome: muscle twitch response''=  Manual: skilled palpation of trigger points. Trigger point release to all trigger points needled   Neuro-re-ed:  Dead lift from box 12 lbs 3x10  Kettle bell swing 3x10 12 lbs  The patient has kettle bells at home.        PATIENT EDUCATION:  Education details:  POC,  rationale of exercises and exercise volume/intensity, rationale of interventions, relevant anatomy, sx response, and exercise form.  Person educated: Patient Education method: Explanation, Demonstration, Tactile cues, Verbal cues, Education comprehension: verbalized understanding, returned demonstration, verbal cues required, tactile cues required, and needs further education  HOME EXERCISE PROGRAM: Access Code: URL: [email or web address] Date: 04/30/2023 Prepared by: Marnie Siren  3-layer heel lift PRI Rt sidelying knee over knee (handout provided)   ASSESSMENT:  CLINICAL IMPRESSION: Pt has improved tolerance with land based exercises.  PT is progressing intensity of exercises and pt is tolerating progression well.  She performed exercises well with cuing and instruction in correct form.  Pt is fatigued with exercises.  She responded well to Rx reporting no increased pain after Rx.  She should benefit from cont skilled PT to address impairments and goals and to improve overall function.       OBJECTIVE IMPAIRMENTS: Abnormal gait, decreased activity tolerance, decreased endurance, decreased mobility, difficulty walking, decreased ROM, decreased strength, hypomobility, and pain.   ACTIVITY LIMITATIONS: lifting, bending, sitting, standing, squatting, stairs, transfers, and locomotion level  PARTICIPATION LIMITATIONS: cleaning, laundry, driving, shopping, and community activity  PERSONAL FACTORS: Time since onset of injury/illness/exacerbation and 3+ comorbidities: R hip labral repair, L ACL repair x 2, cervical fusion x 2 are also affecting patient's functional outcome.   REHAB POTENTIAL: Good  CLINICAL DECISION MAKING: Evolving/moderate complexity  EVALUATION COMPLEXITY: Moderate   GOALS:  SHORT TERM GOALS: Target date:  05/28/2023   Pt will be independent with HEP for improved pain, strength, tolerance to activity, and function.  Baseline: Goal  status:  INITIAL  2.  Pt will tolerate aquatic therapy without adverse effects in order to perform exercises in a reduced stress environment and improve functional mobility, tolerance to activity, strength, and pain.  Baseline:  Goal status: MET -05/24/23 Target date:  05/21/2023   3.  Pt's worst pain will be no > than 8/10 for improved tolerance to activity and mobility.  Baseline: 5/10 Goal status: MET - 05/24/23  4.  Pt will report at least a 25% improvement in her daily mobility.   Baseline: 20% Goal status: PARTIALLY MET - 05/24/23 and 4/22  5.  Pt will demonstrate improved quality of gait including improved Wb'ing thru R LE, reduced hip drop, and reduced limp.  Baseline:  Goal status: PARTIALLY MET  4/22  6.  Pt will perform a 6 inch step up with good form and stability without significant pain Baseline:  Goal status: MET -05/24/23 Target date:  06/04/2023  7.  Pt will ambulate with a normalized heel to toe gait pattern without limping.   Goal status:  PROGRESSING  4/22  Target date:  06/18/2023    LONG TERM GOALS: Target date: 08/20/2023   Pt will ambulate extended community distance without significant pain and difficulty.  Baseline:  Goal status: ONGOING  2.  Pt will ascend and descend stairs with a reciprocal gait with a rail.  Baseline:  Goal status: ONGOING  3.  Pt will be able to walk her neighborhood without significant pain. Baseline:  Goal status: In progress -06/13/23  4.  Pt will be able to perform her ADLs and IADLs including household chores without significant pain and difficulty.  Baseline:  Goal status: ONGOING  5.  Pt will squat with symmetrical Wb'ing in bilat LE's and good form without increased pain for improved functional strength.  Baseline:  Goal status: INITIAL  6.  Pt will demo 4 to 4+/5 strength in R hip abd, 4/5 in R hip flex, and 5/5 in R knee ext for improved performance of and tolerance with functional mobility.  Baseline:  Goal  status: INITIAL   PLAN:  PT FREQUENCY:   2-3x/wk  PT DURATION: 9 weeks  PLANNED INTERVENTIONS: 97164- PT Re-evaluation, 97110-Therapeutic exercises, 97530- Therapeutic activity, 97112- Neuromuscular re-education, 97535- Self Care, 40102- Manual therapy, [phone]- Gait training, [phone]- Aquatic Therapy, [phone]- Electrical stimulation (unattended), Patient/Family education, Balance training, Stair training, Taping, Dry Needling, Joint mobilization, Spinal mobilization, Scar mobilization, Cryotherapy, and Moist heat  PLAN FOR NEXT SESSION:  Pt to transition to more land based therapy.  Cont with DN and exercises per protocol per pt tolerance.    Trina Fujita III PT, DPT 07/04/23 11:01 PM

## 2023-07-04 ENCOUNTER — Encounter (HOSPITAL_BASED_OUTPATIENT_CLINIC_OR_DEPARTMENT_OTHER): Payer: Self-pay | Admitting: Physical Therapy

## 2023-07-05 ENCOUNTER — Encounter (HOSPITAL_BASED_OUTPATIENT_CLINIC_OR_DEPARTMENT_OTHER): Payer: Self-pay | Admitting: Physical Therapy

## 2023-07-05 ENCOUNTER — Ambulatory Visit (HOSPITAL_BASED_OUTPATIENT_CLINIC_OR_DEPARTMENT_OTHER): Admitting: Physical Therapy

## 2023-07-05 DIAGNOSIS — M6281 Muscle weakness (generalized): Secondary | ICD-10-CM

## 2023-07-05 DIAGNOSIS — M25551 Pain in right hip: Secondary | ICD-10-CM

## 2023-07-05 DIAGNOSIS — R262 Difficulty in walking, not elsewhere classified: Secondary | ICD-10-CM

## 2023-07-05 DIAGNOSIS — M25651 Stiffness of right hip, not elsewhere classified: Secondary | ICD-10-CM

## 2023-07-05 NOTE — Therapy (Signed)
 OUTPATIENT PHYSICAL THERAPY LOWER EXTREMITY TREATMENT /    Patient Name: [name] MRN: [id number] DOB:[birth year], 38 y.o., female Today's Date: 07/05/2023  END OF SESSION:  PT End of Session - 07/05/23 2341     Visit Number 22    Number of Visits 36    Date for PT Re-Evaluation 08/20/23    Authorization Type [employer]    PT Start Time 1107    PT Stop Time 1155    PT Time Calculation (min) 48 min    Activity Tolerance Patient tolerated treatment well    Behavior During Therapy WFL for tasks assessed/performed                         Past Medical History:  Diagnosis Date   ACL graft tear (HCC)    left knee   Allergy     Angio-edema    Anxiety    Complication of anesthesia    woke up during surgery   Depression    Family history of adverse reaction to anesthesia     mom is difficult to put to sleep   IUD (intrauterine device) in place    Knee pain, right    Migraine headache    Sleep paralysis    Ulcerative proctitis (HCC)    Past Surgical History:  Procedure Laterality Date   ANTERIOR CRUCIATE LIGAMENT REPAIR Left 08/10/2014   Procedure: ANTERIOR CRUCIATE LIGAMENT (ACL) REPAIR;  Surgeon: Genevie Kerns, MD;  Location: [facility] [place];  Service: Orthopedics;  Laterality: Left;   HIP SURGERY Right 08/25/2019   PAO surgery [facility]   HIP SURGERY Bilateral 01/05/2021   [facility], had hardware taken out   KNEE ARTHROSCOPY Left 08/10/2014   Procedure: ARTHROSCOPY KNEE DEBRIDEMENT ALLOGRAFT ACL REVISION RECONSTRUCTION;  Surgeon: Genevie Kerns, MD;  Location: [facility];  Service: Orthopedics;  Laterality: Left;   KNEE ARTHROSCOPY W/ ACL RECONSTRUCTION Left 2006   SPINAL FUSION     widom tooth extraction     only had 1 wisdom tooth    Patient Active Problem List   Diagnosis Date Noted   Ulcerative proctitis with rectal bleeding (HCC) 06/16/2020   Bloating 06/16/2020   Chronic migraine without aura without status  migrainosus, not intractable 08/19/2018   Migraine with aura and with status migrainosus, not intractable 08/19/2018   Pain in left shoulder 08/19/2018   Pain in left ankle and joints of left foot 05/09/2018   Pain in left hip 05/09/2018   Cervicalgia 05/09/2018   Chronic rhinitis 11/12/2017   Angioedema 11/12/2017   Allergic reaction 11/12/2017   Chronic sinusitis 11/12/2017   Cervical intraepithelial neoplasia grade 1 10/01/2017   GAD (generalized anxiety disorder) 09/17/2014   ACL graft tear (HCC) 08/10/2014   S/P ACL reconstruction 08/10/2014   Migraine headache 05/15/2010     REFERRING PROVIDER: Allan Ishihara, MD  REFERRING DIAG: [phone] (ICD-10-CM) - Status post hip surgery / s/p arthroscopy of hip  S32.691K (ICD-10-CM) - Other specified fracture of right ischium, subsequent encounter for fracture with nonunion  THERAPY DIAG:  Pain in right hip  Muscle weakness (generalized)  Difficulty in walking, not elsewhere classified  Stiffness of right hip, not elsewhere classified  Rationale for Evaluation and Treatment: Rehabilitation  ONSET DATE: DOS  12/27/2022   SUBJECTIVE:   SUBJECTIVE STATEMENT: Pt is 27 weeks and 1 day post op.  Pt states she felt fine after prior Rx.  Pt mowed her back yard yesterday.  She reports no increased pain from mowing, but was tired.  Pt has a hx of intermittent sciatic pain in L LE and reports it has been more constant over the past week.  Pt states she still feels a pulling in her R anterior hip.     PERTINENT HISTORY: Hx of hip surgeries: -Revision R hip femoroplasty, arthroscopy with labral repair, open internal fixation post pelv ring Fx on 12/27/2022 -R hip arthroscopy with femoroplasty, labral repair, and pelvis osteotomy on 08/24/2019 (PAO)  -L hip labral repair and hip bone osteotomy, femoroplasty on 03/21/20 -hardware removal on 01/05/21 in bilat hips     L ACL repair x 2 in 06 and 2016 GAD Migraines Ulcerative colitis  and celiac Mast cell activation syndrome Cervical fusion in 2020 and 2021   PAIN: Are you having any pain?  Yes  NPRS: 4.5/10 current, 8/10 worst, 4/10 best  Location:  R anterior hip, R glute, HS Type:  constant  PRECAUTIONS: Other: per surgical protocol, R hip labral repair, L ACL repair x 2, cervical fusion x 2   WEIGHT BEARING RESTRICTIONS: none indicated on orders  FALLS:  Has patient fallen in last 6 months? No  LIVING ENVIRONMENT: Lives with: lives with their spouse Lives in:  2 story home but moving into a 1 story Stairs: yes   PLOF: Independent  PATIENT GOALS: to be pain-free, improve ROM, improve quality of life    OBJECTIVE:  Note: Objective measures were completed at Evaluation unless otherwise noted.  DIAGNOSTIC FINDINGS: Pelvis x ray on 04/05/23:   Findings/Impression:  Hardware: Right acetabular plate and screw fixation without complication. Alignment: No dislocation. Bones: No acute fracture. Healing osteotomy of the right superior pubic ramus root. Joints: Mild degenerative changes of the left hip. Soft Tissues: Unremarkable.    PATIENT SURVEYS:  LEFS Initial/current:  41/80 / 35/80  COGNITION: Overall cognitive status: Within functional limits for tasks assessed       LOWER EXTREMITY ROM:  Active ROM Right eval Left eval Right 4/22  Hip flexion 105 118 104  Hip extension     Hip abduction 22 30 28   Hip adduction     Hip internal rotation 42 24   Hip external rotation 24 40 32  Knee flexion     Knee extension     Ankle dorsiflexion     Ankle plantarflexion     Ankle inversion     Ankle eversion      (Blank rows = not tested)  LOWER EXTREMITY HHD:  Hip abd (seated):  R:  11.3, L:  16.9   GAIT: Assistive device utilized: None Level of assistance: Complete Independence Comments:  Improved quality of gait with reduced limp though still favors R LE with gait.  Appears to have a R hip drop.  Decreased hip extension.  7/10 pain  with ambulation.  TREATMENT DATE:   5/9 Upright bike L2-3 x 6 mins Supine bridge with march 2x10 Qped birddogs 2x10 Modified planks 3x30 sec S/L hip abd 3x10 R LE, 2x10 L LE Lateral band walks with GTB at thighs x 3 laps at rail Retro walking with RTB around ankles x 2 laps Mini squats 2x12-15 without UE support    5/7 Therapeutic Exercise: Pt performed Upright bike prior to Rx Supine bridge with march 2x10 Qped birddogs 2x10 Modified planks 1x20 sec, 2x30 sec Lateral band walks with RTB at thighs x 1 lap at rail and with GTB at thighs x 2 laps at rail Retro walking Mini squats 2x10-12 without UE support  Dead lift from box 10 lbs 2x10   See below for pt education   Treatment                            5/5: Blank lines following charge title = not provided on this treatment date.   Manual:  TPDN YES  Trigger Point Dry Needling  Subsequent Treatment: Instructions provided previously at initial dry needling treatment.   Patient Verbal Consent Given: Yes Education Handout Provided: Previously Provided Muscles Treated: upper trap, mid deltoid, SCM, subscap, lats, midthoracic paraspinals Electrical Stimulation Performed: No Treatment Response/Outcome: twitch with decreased tension  There-ex:  There-Act: 3-layer heel lift to Rt shoe Self Care:  Nuro-Re-ed: Rt sidelying knee over knee PRI Lt SLS, Rt foot on stool, Lt OH reach, core engagement through Rt obliques.  Gait Training:    5/2 Manual: All PROM performed with distraction to reduce pain and improve movement Trigger Point Dry Needling  Subsequent Treatment: Instructions provided previously at initial dry needling treatment.   Patient Verbal Consent Given: Yes Education Handout Provided: Previously Provided Muscles Treated: R hamstring, R anterior hip fleor , L UT, L  subscapularis Electrical Stimulation Performed: No Treatment Response/Outcome: muscle twitch response''=  Manual: skilled palpation of trigger points. Trigger point release to all trigger points needled   Neuro-re-ed:  Dead lift from box 12 lbs 3x10  Kettle bell swing 3x10 12 lbs  The patient has kettle bells at home.        PATIENT EDUCATION:  Education details:  POC, rationale of exercises and exercise volume/intensity, rationale of interventions, relevant anatomy, sx response, and exercise form.  Person educated: Patient Education method: Explanation, Demonstration, Tactile cues, Verbal cues, Education comprehension: verbalized understanding, returned demonstration, verbal cues required, tactile cues required, and needs further education  HOME EXERCISE PROGRAM: Access Code: URL: [email or web address] Date: 04/30/2023 Prepared by: Marnie Siren  3-layer heel lift PRI Rt sidelying knee over knee (handout provided)   ASSESSMENT:  CLINICAL IMPRESSION: Pt continues to improve with tolerance with land based exercises.  PT is progressing intensity of exercises and pt is tolerating progression well.  PT increased resistance with lateral band walks and added resistance to retro walks.  Pt has improved performance and ease with performing supine bridge with marching.  PT provided cuing and instruction in correct form and she demonstrates improved form with cuing.  Pt is very motivated and gives great effort with all exercises.  She responded well to Rx reporting no increased pain after Rx.  She should benefit from cont skilled PT to address impairments and goals and to improve overall function.       OBJECTIVE IMPAIRMENTS: Abnormal gait, decreased activity tolerance, decreased endurance, decreased mobility, difficulty walking, decreased ROM, decreased strength, hypomobility, and pain.  ACTIVITY LIMITATIONS: lifting, bending, sitting, standing, squatting,  stairs, transfers, and locomotion level  PARTICIPATION LIMITATIONS: cleaning, laundry, driving, shopping, and community activity  PERSONAL FACTORS: Time since onset of injury/illness/exacerbation and 3+ comorbidities: R hip labral repair, L ACL repair x 2, cervical fusion x 2 are also affecting patient's functional outcome.   REHAB POTENTIAL: Good  CLINICAL DECISION MAKING: Evolving/moderate complexity  EVALUATION COMPLEXITY: Moderate   GOALS:  SHORT TERM GOALS: Target date:  05/28/2023   Pt will be independent with HEP for improved pain, strength, tolerance to activity, and function.  Baseline: Goal status: INITIAL  2.  Pt will tolerate aquatic therapy without adverse effects in order to perform exercises in a reduced stress environment and improve functional mobility, tolerance to activity, strength, and pain.  Baseline:  Goal status: MET -05/24/23 Target date:  05/21/2023   3.  Pt's worst pain will be no > than 8/10 for improved tolerance to activity and mobility.  Baseline: 5/10 Goal status: MET - 05/24/23  4.  Pt will report at least a 25% improvement in her daily mobility.   Baseline: 20% Goal status: PARTIALLY MET - 05/24/23 and 4/22  5.  Pt will demonstrate improved quality of gait including improved Wb'ing thru R LE, reduced hip drop, and reduced limp.  Baseline:  Goal status: PARTIALLY MET  4/22  6.  Pt will perform a 6 inch step up with good form and stability without significant pain Baseline:  Goal status: MET -05/24/23 Target date:  06/04/2023  7.  Pt will ambulate with a normalized heel to toe gait pattern without limping.   Goal status:  PROGRESSING  4/22  Target date:  06/18/2023    LONG TERM GOALS: Target date: 08/20/2023   Pt will ambulate extended community distance without significant pain and difficulty.  Baseline:  Goal status: ONGOING  2.  Pt will ascend and descend stairs with a reciprocal gait with a rail.  Baseline:  Goal status:  ONGOING  3.  Pt will be able to walk her neighborhood without significant pain. Baseline:  Goal status: In progress -06/13/23  4.  Pt will be able to perform her ADLs and IADLs including household chores without significant pain and difficulty.  Baseline:  Goal status: ONGOING  5.  Pt will squat with symmetrical Wb'ing in bilat LE's and good form without increased pain for improved functional strength.  Baseline:  Goal status: INITIAL  6.  Pt will demo 4 to 4+/5 strength in R hip abd, 4/5 in R hip flex, and 5/5 in R knee ext for improved performance of and tolerance with functional mobility.  Baseline:  Goal status: INITIAL   PLAN:  PT FREQUENCY:   2-3x/wk  PT DURATION: 9 weeks  PLANNED INTERVENTIONS: 97164- PT Re-evaluation, 97110-Therapeutic exercises, 97530- Therapeutic activity, 97112- Neuromuscular re-education, 97535- Self Care, 95638- Manual therapy, [phone]- Gait training, [phone]- Aquatic Therapy, [phone]- Electrical stimulation (unattended), Patient/Family education, Balance training, Stair training, Taping, Dry Needling, Joint mobilization, Spinal mobilization, Scar mobilization, Cryotherapy, and Moist heat  PLAN FOR NEXT SESSION:  Cont with land based therapy.  Cont with DN and exercises per protocol per pt tolerance.    Trina Fujita III PT, DPT 07/05/23 11:54 PM

## 2023-07-08 ENCOUNTER — Ambulatory Visit (HOSPITAL_BASED_OUTPATIENT_CLINIC_OR_DEPARTMENT_OTHER): Admitting: Physical Therapy

## 2023-07-10 ENCOUNTER — Telehealth (HOSPITAL_COMMUNITY): Payer: Self-pay | Admitting: Psychiatry

## 2023-07-10 ENCOUNTER — Encounter (HOSPITAL_BASED_OUTPATIENT_CLINIC_OR_DEPARTMENT_OTHER): Payer: Self-pay | Admitting: Physical Therapy

## 2023-07-10 ENCOUNTER — Ambulatory Visit (HOSPITAL_BASED_OUTPATIENT_CLINIC_OR_DEPARTMENT_OTHER): Admitting: Physical Therapy

## 2023-07-10 ENCOUNTER — Encounter (HOSPITAL_COMMUNITY): Payer: Self-pay | Admitting: Psychiatry

## 2023-07-10 VITALS — Wt 136.0 lb

## 2023-07-10 DIAGNOSIS — M25551 Pain in right hip: Secondary | ICD-10-CM

## 2023-07-10 DIAGNOSIS — F411 Generalized anxiety disorder: Secondary | ICD-10-CM

## 2023-07-10 DIAGNOSIS — Z62819 Personal history of unspecified abuse in childhood: Secondary | ICD-10-CM

## 2023-07-10 DIAGNOSIS — F4001 Agoraphobia with panic disorder: Secondary | ICD-10-CM | POA: Diagnosis not present

## 2023-07-10 DIAGNOSIS — R262 Difficulty in walking, not elsewhere classified: Secondary | ICD-10-CM

## 2023-07-10 DIAGNOSIS — M25651 Stiffness of right hip, not elsewhere classified: Secondary | ICD-10-CM

## 2023-07-10 DIAGNOSIS — M6281 Muscle weakness (generalized): Secondary | ICD-10-CM

## 2023-07-10 DIAGNOSIS — F330 Major depressive disorder, recurrent, mild: Secondary | ICD-10-CM | POA: Diagnosis not present

## 2023-07-10 MED ORDER — AMITRIPTYLINE HCL 10 MG PO TABS: 10.0000 mg | ORAL_TABLET | Freq: Every day | ORAL | 0 refills | Status: DC
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
## 2023-07-10 NOTE — Telephone Encounter (Signed)
 D:  Dr. Carlos Chesterfield requested the MH-IOP Case Mgr to call pt in order to provide her with resources.  A:  Placed call to provide pt with EMDR therapists names (ie. [facility] [phone], Harley Lies, LMFT [phone]; [facility] [phone]; Xenia Heinz, [facility] [phone]; Deloris Fetters, [facility] [phone]), but there was no answer.  Left vm requesting pt to call the case manager back so she can provide her with the names of the providers.  Inform Dr. Carlos Chesterfield.

## 2023-07-10 NOTE — Therapy (Signed)
 OUTPATIENT PHYSICAL THERAPY LOWER EXTREMITY TREATMENT /    Patient Name: [name] MRN: [id number] DOB:1985/09/08, 38 y.o., female Today's Date: 07/10/2023  END OF SESSION:  PT End of Session - 07/10/23 1523     Visit Number 23    Number of Visits 36    Date for PT Re-Evaluation 08/20/23    Authorization Type [employer]    PT Start Time 1523    PT Stop Time 1606    PT Time Calculation (min) 43 min    Activity Tolerance Patient tolerated treatment well    Behavior During Therapy WFL for tasks assessed/performed                         Past Medical History:  Diagnosis Date   ACL graft tear (HCC)    left knee   Allergy     Angio-edema    Anxiety    Complication of anesthesia    woke up during surgery   Depression    Family history of adverse reaction to anesthesia     mom is difficult to put to sleep   IUD (intrauterine device) in place    Knee pain, right    Migraine headache    Sleep paralysis    Ulcerative proctitis (HCC)    Past Surgical History:  Procedure Laterality Date   ANTERIOR CRUCIATE LIGAMENT REPAIR Left 08/10/2014   Procedure: ANTERIOR CRUCIATE LIGAMENT (ACL) REPAIR;  Surgeon: Genevie Kerns, MD;  Location: [facility] [place];  Service: Orthopedics;  Laterality: Left;   HIP SURGERY Right 08/25/2019   PAO surgery [facility]   HIP SURGERY Bilateral 01/05/2021   [facility], had hardware taken out   KNEE ARTHROSCOPY Left 08/10/2014   Procedure: ARTHROSCOPY KNEE DEBRIDEMENT ALLOGRAFT ACL REVISION RECONSTRUCTION;  Surgeon: Genevie Kerns, MD;  Location: [facility];  Service: Orthopedics;  Laterality: Left;   KNEE ARTHROSCOPY W/ ACL RECONSTRUCTION Left 2006   SPINAL FUSION     widom tooth extraction     only had 1 wisdom tooth    Patient Active Problem List   Diagnosis Date Noted   Ulcerative proctitis with rectal bleeding (HCC) 06/16/2020   Bloating 06/16/2020   Chronic migraine without aura without status  migrainosus, not intractable 08/19/2018   Migraine with aura and with status migrainosus, not intractable 08/19/2018   Pain in left shoulder 08/19/2018   Pain in left ankle and joints of left foot 05/09/2018   Pain in left hip 05/09/2018   Cervicalgia 05/09/2018   Chronic rhinitis 11/12/2017   Angioedema 11/12/2017   Allergic reaction 11/12/2017   Chronic sinusitis 11/12/2017   Cervical intraepithelial neoplasia grade 1 10/01/2017   GAD (generalized anxiety disorder) 09/17/2014   ACL graft tear (HCC) 08/10/2014   S/P ACL reconstruction 08/10/2014   Migraine headache 05/15/2010     REFERRING PROVIDER: Allan Ishihara, MD  REFERRING DIAG: [phone] (ICD-10-CM) - Status post hip surgery / s/p arthroscopy of hip  S32.691K (ICD-10-CM) - Other specified fracture of right ischium, subsequent encounter for fracture with nonunion  THERAPY DIAG:  Pain in right hip  Muscle weakness (generalized)  Difficulty in walking, not elsewhere classified  Stiffness of right hip, not elsewhere classified  Rationale for Evaluation and Treatment: Rehabilitation  ONSET DATE: DOS  12/27/2022   SUBJECTIVE:   SUBJECTIVE STATEMENT: Pt states hip has been sore for last week. Hip flexor is tight and so are hamstrings. Issues with the sciatic nerve down to foot.  Sitting position can irritate it.      PERTINENT HISTORY: Hx of hip surgeries: -Revision R hip femoroplasty, arthroscopy with labral repair, open internal fixation post pelv ring Fx on 12/27/2022 -R hip arthroscopy with femoroplasty, labral repair, and pelvis osteotomy on 08/24/2019 (PAO)  -L hip labral repair and hip bone osteotomy, femoroplasty on 03/21/20 -hardware removal on 01/05/21 in bilat hips     L ACL repair x 2 in 06 and 2016 GAD Migraines Ulcerative colitis and celiac Mast cell activation syndrome Cervical fusion in 2020 and 2021   PAIN: Are you having any pain?  Yes  NPRS: 4.5/10 current, 8/10 worst, 4/10 best   Location:  R anterior hip, R glute, HS Type:  constant  PRECAUTIONS: Other: per surgical protocol, R hip labral repair, L ACL repair x 2, cervical fusion x 2   WEIGHT BEARING RESTRICTIONS: none indicated on orders  FALLS:  Has patient fallen in last 6 months? No  LIVING ENVIRONMENT: Lives with: lives with their spouse Lives in:  2 story home but moving into a 1 story Stairs: yes   PLOF: Independent  PATIENT GOALS: to be pain-free, improve ROM, improve quality of life    OBJECTIVE:  Note: Objective measures were completed at Evaluation unless otherwise noted.  DIAGNOSTIC FINDINGS: Pelvis x ray on 04/05/23:   Findings/Impression:  Hardware: Right acetabular plate and screw fixation without complication. Alignment: No dislocation. Bones: No acute fracture. Healing osteotomy of the right superior pubic ramus root. Joints: Mild degenerative changes of the left hip. Soft Tissues: Unremarkable.    PATIENT SURVEYS:  LEFS Initial/current:  41/80 / 35/80  COGNITION: Overall cognitive status: Within functional limits for tasks assessed       LOWER EXTREMITY ROM:  Active ROM Right eval Left eval Right 4/22  Hip flexion 105 118 104  Hip extension     Hip abduction 22 30 28   Hip adduction     Hip internal rotation 42 24   Hip external rotation 24 40 32  Knee flexion     Knee extension     Ankle dorsiflexion     Ankle plantarflexion     Ankle inversion     Ankle eversion      (Blank rows = not tested)  LOWER EXTREMITY HHD:  Hip abd (seated):  R:  11.3, L:  16.9   GAIT: Assistive device utilized: None Level of assistance: Complete Independence Comments:  Improved quality of gait with reduced limp though still favors R LE with gait.  Appears to have a R hip drop.  Decreased hip extension.  7/10 pain with ambulation.                                                                                                                                TREATMENT DATE:    07/10/23 Manual: STM to R hip flexors, hamstrings, glutes pre and post dry needling for trigger point identification and muscular  relaxation. Trigger Point Dry Needling  Subsequent Treatment: Instructions provided previously at initial dry needling treatment.   Patient Verbal Consent Given: Yes Education Handout Provided: Previously Provided Muscles Treated:  R hip flexors, hamstrings, glutes Electrical Stimulation Performed: No Treatment Response/Outcome: twitch response, decrease in tissue tension Prone hip flexor stretch with PA glides grade II-III    5/9 Upright bike L2-3 x 6 mins Supine bridge with march 2x10 Qped birddogs 2x10 Modified planks 3x30 sec S/L hip abd 3x10 R LE, 2x10 L LE Lateral band walks with GTB at thighs x 3 laps at rail Retro walking with RTB around ankles x 2 laps Mini squats 2x12-15 without UE support    5/7 Therapeutic Exercise: Pt performed Upright bike prior to Rx Supine bridge with march 2x10 Qped birddogs 2x10 Modified planks 1x20 sec, 2x30 sec Lateral band walks with RTB at thighs x 1 lap at rail and with GTB at thighs x 2 laps at rail Retro walking Mini squats 2x10-12 without UE support  Dead lift from box 10 lbs 2x10   See below for pt education   Treatment                            5/5: Blank lines following charge title = not provided on this treatment date.   Manual:  TPDN YES  Trigger Point Dry Needling  Subsequent Treatment: Instructions provided previously at initial dry needling treatment.   Patient Verbal Consent Given: Yes Education Handout Provided: Previously Provided Muscles Treated: upper trap, mid deltoid, SCM, subscap, lats, midthoracic paraspinals Electrical Stimulation Performed: No Treatment Response/Outcome: twitch with decreased tension  There-ex:  There-Act: 3-layer heel lift to Rt shoe Self Care:  Nuro-Re-ed: Rt sidelying knee over knee PRI Lt SLS, Rt foot on stool, Lt OH reach, core engagement  through Rt obliques.  Gait Training:    5/2 Manual: All PROM performed with distraction to reduce pain and improve movement Trigger Point Dry Needling  Subsequent Treatment: Instructions provided previously at initial dry needling treatment.   Patient Verbal Consent Given: Yes Education Handout Provided: Previously Provided Muscles Treated: R hamstring, R anterior hip fleor , L UT, L subscapularis Electrical Stimulation Performed: No Treatment Response/Outcome: muscle twitch response''=  Manual: skilled palpation of trigger points. Trigger point release to all trigger points needled   Neuro-re-ed:  Dead lift from box 12 lbs 3x10  Kettle bell swing 3x10 12 lbs  The patient has kettle bells at home.        PATIENT EDUCATION:  Education details:  POC, rationale of exercises and exercise volume/intensity, rationale of interventions, relevant anatomy, sx response, and exercise form.  Person educated: Patient Education method: Explanation, Demonstration, Tactile cues, Verbal cues, Education comprehension: verbalized understanding, returned demonstration, verbal cues required, tactile cues required, and needs further education  HOME EXERCISE PROGRAM: Access Code: URL: [email or web address] Date: 04/30/2023 Prepared by: Marnie Siren  3-layer heel lift PRI Rt sidelying knee over knee (handout provided)   ASSESSMENT:  CLINICAL IMPRESSION: Continued with DN and manual to R hip for pain/mobility deficits. Some residual hip flexor tightness remaining but decrease in tissue tension hamstrings and glutes. Patient will continue to benefit from physical therapy in order to improve function and reduce impairment.      OBJECTIVE IMPAIRMENTS: Abnormal gait, decreased activity tolerance, decreased endurance, decreased mobility, difficulty walking, decreased ROM, decreased strength, hypomobility, and pain.   ACTIVITY LIMITATIONS: lifting, bending,  sitting, standing, squatting, stairs, transfers, and  locomotion level  PARTICIPATION LIMITATIONS: cleaning, laundry, driving, shopping, and community activity  PERSONAL FACTORS: Time since onset of injury/illness/exacerbation and 3+ comorbidities: R hip labral repair, L ACL repair x 2, cervical fusion x 2 are also affecting patient's functional outcome.   REHAB POTENTIAL: Good  CLINICAL DECISION MAKING: Evolving/moderate complexity  EVALUATION COMPLEXITY: Moderate   GOALS:  SHORT TERM GOALS: Target date:  05/28/2023   Pt will be independent with HEP for improved pain, strength, tolerance to activity, and function.  Baseline: Goal status: INITIAL  2.  Pt will tolerate aquatic therapy without adverse effects in order to perform exercises in a reduced stress environment and improve functional mobility, tolerance to activity, strength, and pain.  Baseline:  Goal status: MET -05/24/23 Target date:  05/21/2023   3.  Pt's worst pain will be no > than 8/10 for improved tolerance to activity and mobility.  Baseline: 5/10 Goal status: MET - 05/24/23  4.  Pt will report at least a 25% improvement in her daily mobility.   Baseline: 20% Goal status: PARTIALLY MET - 05/24/23 and 4/22  5.  Pt will demonstrate improved quality of gait including improved Wb'ing thru R LE, reduced hip drop, and reduced limp.  Baseline:  Goal status: PARTIALLY MET  4/22  6.  Pt will perform a 6 inch step up with good form and stability without significant pain Baseline:  Goal status: MET -05/24/23 Target date:  06/04/2023  7.  Pt will ambulate with a normalized heel to toe gait pattern without limping.   Goal status:  PROGRESSING  4/22  Target date:  06/18/2023    LONG TERM GOALS: Target date: 08/20/2023   Pt will ambulate extended community distance without significant pain and difficulty.  Baseline:  Goal status: ONGOING  2.  Pt will ascend and descend stairs with a reciprocal gait with a rail.   Baseline:  Goal status: ONGOING  3.  Pt will be able to walk her neighborhood without significant pain. Baseline:  Goal status: In progress -06/13/23  4.  Pt will be able to perform her ADLs and IADLs including household chores without significant pain and difficulty.  Baseline:  Goal status: ONGOING  5.  Pt will squat with symmetrical Wb'ing in bilat LE's and good form without increased pain for improved functional strength.  Baseline:  Goal status: INITIAL  6.  Pt will demo 4 to 4+/5 strength in R hip abd, 4/5 in R hip flex, and 5/5 in R knee ext for improved performance of and tolerance with functional mobility.  Baseline:  Goal status: INITIAL   PLAN:  PT FREQUENCY:   2-3x/wk  PT DURATION: 9 weeks  PLANNED INTERVENTIONS: 97164- PT Re-evaluation, 97110-Therapeutic exercises, 97530- Therapeutic activity, 97112- Neuromuscular re-education, 97535- Self Care, 09604- Manual therapy, [phone]- Gait training, [phone]- Aquatic Therapy, [phone]- Electrical stimulation (unattended), Patient/Family education, Balance training, Stair training, Taping, Dry Needling, Joint mobilization, Spinal mobilization, Scar mobilization, Cryotherapy, and Moist heat  PLAN FOR NEXT SESSION:  Cont with land based therapy.  Cont with DN and exercises per protocol per pt tolerance.     Perfecto Bracket Kristina Bertone, PT 07/10/2023, 4:12 PM

## 2023-07-12 ENCOUNTER — Encounter (HOSPITAL_BASED_OUTPATIENT_CLINIC_OR_DEPARTMENT_OTHER): Admitting: Physical Therapy

## 2023-07-15 ENCOUNTER — Ambulatory Visit (HOSPITAL_BASED_OUTPATIENT_CLINIC_OR_DEPARTMENT_OTHER): Admitting: Physical Therapy

## 2023-07-15 ENCOUNTER — Encounter (HOSPITAL_BASED_OUTPATIENT_CLINIC_OR_DEPARTMENT_OTHER): Payer: Self-pay | Admitting: Physical Therapy

## 2023-07-15 DIAGNOSIS — R262 Difficulty in walking, not elsewhere classified: Secondary | ICD-10-CM

## 2023-07-15 DIAGNOSIS — M6281 Muscle weakness (generalized): Secondary | ICD-10-CM

## 2023-07-15 DIAGNOSIS — M25551 Pain in right hip: Secondary | ICD-10-CM

## 2023-07-15 DIAGNOSIS — M25651 Stiffness of right hip, not elsewhere classified: Secondary | ICD-10-CM

## 2023-07-15 NOTE — Therapy (Signed)
 OUTPATIENT PHYSICAL THERAPY LOWER EXTREMITY TREATMENT /    Patient Name: [name] MRN: [id number] DOB:Dec 13, 1985, 38 y.o., female Today's Date: 07/16/2023  END OF SESSION:  PT End of Session - 07/15/23 1449     Visit Number 24    Number of Visits 36    Date for PT Re-Evaluation 08/20/23    Authorization Type [employer]    PT Start Time 1416    PT Stop Time 1458    PT Time Calculation (min) 42 min    Activity Tolerance Patient tolerated treatment well    Behavior During Therapy WFL for tasks assessed/performed                          Past Medical History:  Diagnosis Date   ACL graft tear (HCC)    left knee   Allergy     Angio-edema    Anxiety    Complication of anesthesia    woke up during surgery   Depression    Family history of adverse reaction to anesthesia     mom is difficult to put to sleep   IUD (intrauterine device) in place    Knee pain, right    Migraine headache    Sleep paralysis    Ulcerative proctitis (HCC)    Past Surgical History:  Procedure Laterality Date   ANTERIOR CRUCIATE LIGAMENT REPAIR Left 08/10/2014   Procedure: ANTERIOR CRUCIATE LIGAMENT (ACL) REPAIR;  Surgeon: Genevie Kerns, MD;  Location: [facility];  Service: Orthopedics;  Laterality: Left;   HIP SURGERY Right 08/25/2019   PAO surgery [facility]   HIP SURGERY Bilateral 01/05/2021   [facility], had hardware taken out   KNEE ARTHROSCOPY Left 08/10/2014   Procedure: ARTHROSCOPY KNEE DEBRIDEMENT ALLOGRAFT ACL REVISION RECONSTRUCTION;  Surgeon: Genevie Kerns, MD;  Location: [facility];  Service: Orthopedics;  Laterality: Left;   KNEE ARTHROSCOPY W/ ACL RECONSTRUCTION Left 2006   SPINAL FUSION     widom tooth extraction     only had 1 wisdom tooth    Patient Active Problem List   Diagnosis Date Noted   Ulcerative proctitis with rectal bleeding (HCC) 06/16/2020   Bloating 06/16/2020   Chronic migraine without aura without status  migrainosus, not intractable 08/19/2018   Migraine with aura and with status migrainosus, not intractable 08/19/2018   Pain in left shoulder 08/19/2018   Pain in left ankle and joints of left foot 05/09/2018   Pain in left hip 05/09/2018   Cervicalgia 05/09/2018   Chronic rhinitis 11/12/2017   Angioedema 11/12/2017   Allergic reaction 11/12/2017   Chronic sinusitis 11/12/2017   Cervical intraepithelial neoplasia grade 1 10/01/2017   GAD (generalized anxiety disorder) 09/17/2014   ACL graft tear (HCC) 08/10/2014   S/P ACL reconstruction 08/10/2014   Migraine headache 05/15/2010     REFERRING PROVIDER: Allan Ishihara, MD  REFERRING DIAG: [id number] (ICD-10-CM) - Status post hip surgery / s/p arthroscopy of hip  S32.691K (ICD-10-CM) - Other specified fracture of right ischium, subsequent encounter for fracture with nonunion  THERAPY DIAG:  Pain in right hip  Muscle weakness (generalized)  Difficulty in walking, not elsewhere classified  Stiffness of right hip, not elsewhere classified  Rationale for Evaluation and Treatment: Rehabilitation  ONSET DATE: DOS  12/27/2022   SUBJECTIVE:   SUBJECTIVE STATEMENT: Pt states her sciatica is flare up and she is feeling it a lot in R SI area and posterior hip.  Pt drove  to [place] and back this weekend.  Pt states she was in pain the whole weekend.  Pt denies any increased pain after  prior Rx.  Pt states she is still having the pain in front of the hip.       PERTINENT HISTORY: Hx of hip surgeries: -Revision R hip femoroplasty, arthroscopy with labral repair, open internal fixation post pelv ring Fx on 12/27/2022 -R hip arthroscopy with femoroplasty, labral repair, and pelvis osteotomy on 08/24/2019 (PAO)  -L hip labral repair and hip bone osteotomy, femoroplasty on 03/21/20 -hardware removal on 01/05/21 in bilat hips     L ACL repair x 2 in 06 and 2016 GAD Migraines Ulcerative colitis and celiac Mast cell activation  syndrome Cervical fusion in 2020 and 2021   PAIN: Are you having any pain?  Yes  NPRS: 6/10 current, 8/10 worst, 4/10 best  Location:  R anterior hip, R glute, HS Type:  constant  PRECAUTIONS: Other: per surgical protocol, R hip labral repair, L ACL repair x 2, cervical fusion x 2   WEIGHT BEARING RESTRICTIONS: none indicated on orders  FALLS:  Has patient fallen in last 6 months? No  LIVING ENVIRONMENT: Lives with: lives with their spouse Lives in:  2 story home but moving into a 1 story Stairs: yes   PLOF: Independent  PATIENT GOALS: to be pain-free, improve ROM, improve quality of life    OBJECTIVE:  Note: Objective measures were completed at Evaluation unless otherwise noted.  DIAGNOSTIC FINDINGS: Pelvis x ray on 04/05/23:   Findings/Impression:  Hardware: Right acetabular plate and screw fixation without complication. Alignment: No dislocation. Bones: No acute fracture. Healing osteotomy of the right superior pubic ramus root. Joints: Mild degenerative changes of the left hip. Soft Tissues: Unremarkable.    PATIENT SURVEYS:  LEFS Initial/current:  41/80 / 35/80  COGNITION: Overall cognitive status: Within functional limits for tasks assessed       LOWER EXTREMITY ROM:  Active ROM Right eval Left eval Right 4/22  Hip flexion 105 118 104  Hip extension     Hip abduction 22 30 28   Hip adduction     Hip internal rotation 42 24   Hip external rotation 24 40 32  Knee flexion     Knee extension     Ankle dorsiflexion     Ankle plantarflexion     Ankle inversion     Ankle eversion      (Blank rows = not tested)  LOWER EXTREMITY HHD:  Hip abd (seated):  R:  11.3, L:  16.9   GAIT: Assistive device utilized: None Level of assistance: Complete Independence Comments:  Improved quality of gait with reduced limp though still favors R LE with gait.  Appears to have a R hip drop.  Decreased hip extension.  7/10 pain with ambulation.  TREATMENT DATE:   07/15/23 Upright bike L2-3 x 6 mins Supine bridge with march 2x10 Modified plank x30 sec Prone plank 2x30 sec Side modified plank 2x10 sec bilat S/L hip abduction 2x10 Modified side plank 2x20 sec bilat Lateral band walks with GTB above knees 3x10-12 Retro walking with RTB around ankles x 2 laps Mini squats 2x13,14 without UE support       07/10/23 Manual: STM to R hip flexors, hamstrings, glutes pre and post dry needling for trigger point identification and muscular relaxation. Trigger Point Dry Needling  Subsequent Treatment: Instructions provided previously at initial dry needling treatment.   Patient Verbal Consent Given: Yes Education Handout Provided: Previously Provided Muscles Treated:  R hip flexors, hamstrings, glutes Electrical Stimulation Performed: No Treatment Response/Outcome: twitch response, decrease in tissue tension Prone hip flexor stretch with PA glides grade II-III    5/9 Upright bike L2-3 x 6 mins Supine bridge with march 2x10 Qped birddogs 2x10 Modified planks 3x30 sec S/L hip abd 3x10 R LE, 2x10 L LE Lateral band walks with GTB at thighs x 3 laps at rail Retro walking with RTB around ankles x 2 laps Mini squats 2x12-15 without UE support    5/7 Therapeutic Exercise: Pt performed Upright bike prior to Rx Supine bridge with march 2x10 Qped birddogs 2x10 Modified planks 1x20 sec, 2x30 sec Lateral band walks with RTB at thighs x 1 lap at rail and with GTB at thighs x 2 laps at rail Retro walking Mini squats 2x10-12 without UE support  Dead lift from box 10 lbs 2x10   See below for pt education   Treatment                            5/5: Blank lines following charge title = not provided on this treatment date.   Manual:  TPDN YES  Trigger Point Dry Needling  Subsequent Treatment: Instructions provided  previously at initial dry needling treatment.   Patient Verbal Consent Given: Yes Education Handout Provided: Previously Provided Muscles Treated: upper trap, mid deltoid, SCM, subscap, lats, midthoracic paraspinals Electrical Stimulation Performed: No Treatment Response/Outcome: twitch with decreased tension  There-ex:  There-Act: 3-layer heel lift to Rt shoe Self Care:  Nuro-Re-ed: Rt sidelying knee over knee PRI Lt SLS, Rt foot on stool, Lt OH reach, core engagement through Rt obliques.  Gait Training:       PATIENT EDUCATION:  Education details:  POC, rationale of exercises and exercise volume/intensity, rationale of interventions, relevant anatomy, sx response, and exercise form.  Person educated: Patient Education method: Explanation, Demonstration, Tactile cues, Verbal cues, Education comprehension: verbalized understanding, returned demonstration, verbal cues required, tactile cues required, and needs further education  HOME EXERCISE PROGRAM: Access Code: URL: [email or web address] Date: 04/30/2023 Prepared by: Marnie Siren  3-layer heel lift PRI Rt sidelying knee over knee (handout provided)   ASSESSMENT:  CLINICAL IMPRESSION: Pt performed exercises well with cuing for correct form.  Pt demonstrates improved core strength as evidenced by performance of planks.  Pt performed plank for 30 sec on elbows and toes.  Pt also performed side planks today.  Pt had good tolerance with exercises.  She responded well to Rx reporting improved pain from 6/10 before Rx to 5/10 after Rx.  Pt should benefit from cont skilled PT to improve core and LE strength, tolerance to activity, and functional mobility.       OBJECTIVE IMPAIRMENTS: Abnormal gait, decreased activity tolerance, decreased endurance,  decreased mobility, difficulty walking, decreased ROM, decreased strength, hypomobility, and pain.   ACTIVITY LIMITATIONS: lifting, bending, sitting,  standing, squatting, stairs, transfers, and locomotion level  PARTICIPATION LIMITATIONS: cleaning, laundry, driving, shopping, and community activity  PERSONAL FACTORS: Time since onset of injury/illness/exacerbation and 3+ comorbidities: R hip labral repair, L ACL repair x 2, cervical fusion x 2 are also affecting patient's functional outcome.   REHAB POTENTIAL: Good  CLINICAL DECISION MAKING: Evolving/moderate complexity  EVALUATION COMPLEXITY: Moderate   GOALS:  SHORT TERM GOALS: Target date:  05/28/2023   Pt will be independent with HEP for improved pain, strength, tolerance to activity, and function.  Baseline: Goal status: INITIAL  2.  Pt will tolerate aquatic therapy without adverse effects in order to perform exercises in a reduced stress environment and improve functional mobility, tolerance to activity, strength, and pain.  Baseline:  Goal status: MET -05/24/23 Target date:  05/21/2023   3.  Pt's worst pain will be no > than 8/10 for improved tolerance to activity and mobility.  Baseline: 5/10 Goal status: MET - 05/24/23  4.  Pt will report at least a 25% improvement in her daily mobility.   Baseline: 20% Goal status: PARTIALLY MET - 05/24/23 and 4/22  5.  Pt will demonstrate improved quality of gait including improved Wb'ing thru R LE, reduced hip drop, and reduced limp.  Baseline:  Goal status: PARTIALLY MET  4/22  6.  Pt will perform a 6 inch step up with good form and stability without significant pain Baseline:  Goal status: MET -05/24/23 Target date:  06/04/2023  7.  Pt will ambulate with a normalized heel to toe gait pattern without limping.   Goal status:  PROGRESSING  4/22  Target date:  06/18/2023    LONG TERM GOALS: Target date: 08/20/2023   Pt will ambulate extended community distance without significant pain and difficulty.  Baseline:  Goal status: ONGOING  2.  Pt will ascend and descend stairs with a reciprocal gait with a rail.  Baseline:  Goal  status: ONGOING  3.  Pt will be able to walk her neighborhood without significant pain. Baseline:  Goal status: In progress -06/13/23  4.  Pt will be able to perform her ADLs and IADLs including household chores without significant pain and difficulty.  Baseline:  Goal status: ONGOING  5.  Pt will squat with symmetrical Wb'ing in bilat LE's and good form without increased pain for improved functional strength.  Baseline:  Goal status: INITIAL  6.  Pt will demo 4 to 4+/5 strength in R hip abd, 4/5 in R hip flex, and 5/5 in R knee ext for improved performance of and tolerance with functional mobility.  Baseline:  Goal status: INITIAL   PLAN:  PT FREQUENCY:   2-3x/wk  PT DURATION: 9 weeks  PLANNED INTERVENTIONS: 97164- PT Re-evaluation, 97110-Therapeutic exercises, 97530- Therapeutic activity, 97112- Neuromuscular re-education, 97535- Self Care, 16109- Manual therapy, [phone]- Gait training, [phone]- Aquatic Therapy, [phone]- Electrical stimulation (unattended), Patient/Family education, Balance training, Stair training, Taping, Dry Needling, Joint mobilization, Spinal mobilization, Scar mobilization, Cryotherapy, and Moist heat  PLAN FOR NEXT SESSION:  Cont with land based therapy.  Cont with DN and exercises per protocol per pt tolerance.     Trina Fujita III PT, DPT 07/16/23 9:05 PM

## 2023-07-16 ENCOUNTER — Ambulatory Visit (HOSPITAL_BASED_OUTPATIENT_CLINIC_OR_DEPARTMENT_OTHER): Admitting: Physical Therapy

## 2023-07-16 DIAGNOSIS — R262 Difficulty in walking, not elsewhere classified: Secondary | ICD-10-CM

## 2023-07-16 DIAGNOSIS — M25551 Pain in right hip: Secondary | ICD-10-CM

## 2023-07-16 DIAGNOSIS — M6281 Muscle weakness (generalized): Secondary | ICD-10-CM

## 2023-07-16 DIAGNOSIS — M25651 Stiffness of right hip, not elsewhere classified: Secondary | ICD-10-CM

## 2023-07-17 ENCOUNTER — Encounter (HOSPITAL_BASED_OUTPATIENT_CLINIC_OR_DEPARTMENT_OTHER): Payer: Self-pay | Admitting: Physical Therapy

## 2023-07-17 ENCOUNTER — Ambulatory Visit (HOSPITAL_BASED_OUTPATIENT_CLINIC_OR_DEPARTMENT_OTHER): Admitting: Physical Therapy

## 2023-07-17 DIAGNOSIS — M25651 Stiffness of right hip, not elsewhere classified: Secondary | ICD-10-CM

## 2023-07-17 DIAGNOSIS — R262 Difficulty in walking, not elsewhere classified: Secondary | ICD-10-CM

## 2023-07-17 DIAGNOSIS — M25551 Pain in right hip: Secondary | ICD-10-CM

## 2023-07-17 DIAGNOSIS — M6281 Muscle weakness (generalized): Secondary | ICD-10-CM

## 2023-07-17 NOTE — Therapy (Signed)
 OUTPATIENT PHYSICAL THERAPY LOWER EXTREMITY TREATMENT /    Patient Name: [name] MRN: [id number] DOB:04-16-1985, 38 y.o., female Today's Date: 07/17/2023  END OF SESSION:  PT End of Session - 07/17/23 1436     Visit Number 26    Number of Visits 36    Date for PT Re-Evaluation 08/20/23    Authorization Type [employer]    PT Start Time 1430    PT Stop Time 1512    PT Time Calculation (min) 42 min    Activity Tolerance Patient tolerated treatment well    Behavior During Therapy WFL for tasks assessed/performed                          Past Medical History:  Diagnosis Date   ACL graft tear (HCC)    left knee   Allergy     Angio-edema    Anxiety    Complication of anesthesia    woke up during surgery   Depression    Family history of adverse reaction to anesthesia     mom is difficult to put to sleep   IUD (intrauterine device) in place    Knee pain, right    Migraine headache    Sleep paralysis    Ulcerative proctitis (HCC)    Past Surgical History:  Procedure Laterality Date   ANTERIOR CRUCIATE LIGAMENT REPAIR Left 08/10/2014   Procedure: ANTERIOR CRUCIATE LIGAMENT (ACL) REPAIR;  Surgeon: Genevie Kerns, MD;  Location: [facility];  Service: Orthopedics;  Laterality: Left;   HIP SURGERY Right 08/25/2019   PAO surgery [facility]   HIP SURGERY Bilateral 01/05/2021   [facility], had hardware taken out   KNEE ARTHROSCOPY Left 08/10/2014   Procedure: ARTHROSCOPY KNEE DEBRIDEMENT ALLOGRAFT ACL REVISION RECONSTRUCTION;  Surgeon: Genevie Kerns, MD;  Location: [facility];  Service: Orthopedics;  Laterality: Left;   KNEE ARTHROSCOPY W/ ACL RECONSTRUCTION Left 2006   SPINAL FUSION     widom tooth extraction     only had 1 wisdom tooth    Patient Active Problem List   Diagnosis Date Noted   Ulcerative proctitis with rectal bleeding (HCC) 06/16/2020   Bloating 06/16/2020   Chronic migraine without aura without status  migrainosus, not intractable 08/19/2018   Migraine with aura and with status migrainosus, not intractable 08/19/2018   Pain in left shoulder 08/19/2018   Pain in left ankle and joints of left foot 05/09/2018   Pain in left hip 05/09/2018   Cervicalgia 05/09/2018   Chronic rhinitis 11/12/2017   Angioedema 11/12/2017   Allergic reaction 11/12/2017   Chronic sinusitis 11/12/2017   Cervical intraepithelial neoplasia grade 1 10/01/2017   GAD (generalized anxiety disorder) 09/17/2014   ACL graft tear (HCC) 08/10/2014   S/P ACL reconstruction 08/10/2014   Migraine headache 05/15/2010     REFERRING PROVIDER: Allan Ishihara, MD  REFERRING DIAG: [phone] (ICD-10-CM) - Status post hip surgery / s/p arthroscopy of hip  S32.691K (ICD-10-CM) - Other specified fracture of right ischium, subsequent encounter for fracture with nonunion  THERAPY DIAG:  No diagnosis found.  Rationale for Evaluation and Treatment: Rehabilitation  ONSET DATE: DOS  12/27/2022   SUBJECTIVE:   SUBJECTIVE STATEMENT: The patient was on a trip for several days. She did a lot of walking. She is very sore in her anterior and posterior hip today. She also has a headache.        PERTINENT HISTORY: Hx of hip surgeries: -  Revision R hip femoroplasty, arthroscopy with labral repair, open internal fixation post pelv ring Fx on 12/27/2022 -R hip arthroscopy with femoroplasty, labral repair, and pelvis osteotomy on 08/24/2019 (PAO)  -L hip labral repair and hip bone osteotomy, femoroplasty on 03/21/20 -hardware removal on 01/05/21 in bilat hips     L ACL repair x 2 in 06 and 2016 GAD Migraines Ulcerative colitis and celiac Mast cell activation syndrome Cervical fusion in 2020 and 2021   PAIN: Are you having any pain?  Yes  NPRS: 6/10 current, 8/10 worst, 4/10 best  Location:  R anterior hip, R glute, HS Type:  constant  PRECAUTIONS: Other: per surgical protocol, R hip labral repair, L ACL repair x 2, cervical  fusion x 2   WEIGHT BEARING RESTRICTIONS: none indicated on orders  FALLS:  Has patient fallen in last 6 months? No  LIVING ENVIRONMENT: Lives with: lives with their spouse Lives in:  2 story home but moving into a 1 story Stairs: yes   PLOF: Independent  PATIENT GOALS: to be pain-free, improve ROM, improve quality of life    OBJECTIVE:  Note: Objective measures were completed at Evaluation unless otherwise noted.  DIAGNOSTIC FINDINGS: Pelvis x ray on 04/05/23:   Findings/Impression:  Hardware: Right acetabular plate and screw fixation without complication. Alignment: No dislocation. Bones: No acute fracture. Healing osteotomy of the right superior pubic ramus root. Joints: Mild degenerative changes of the left hip. Soft Tissues: Unremarkable.    PATIENT SURVEYS:  LEFS Initial/current:  41/80 / 35/80  COGNITION: Overall cognitive status: Within functional limits for tasks assessed       LOWER EXTREMITY ROM:  Active ROM Right eval Left eval Right 4/22  Hip flexion 105 118 104  Hip extension     Hip abduction 22 30 28   Hip adduction     Hip internal rotation 42 24   Hip external rotation 24 40 32  Knee flexion     Knee extension     Ankle dorsiflexion     Ankle plantarflexion     Ankle inversion     Ankle eversion      (Blank rows = not tested)  LOWER EXTREMITY HHD:  Hip abd (seated):  R:  11.3, L:  16.9 LOWER EXTREMITY MMT:    MMT Right  Left   Hip flexion 11.6 18.6  Hip extension    Hip abduction 18.9 21.4  Hip adduction    Hip internal rotation    Hip external rotation    Knee flexion    Knee extension 23.8 32.6  Ankle dorsiflexion    Ankle plantarflexion    Ankle inversion    Ankle eversion     (Blank rows = not tested)   GAIT: Assistive device utilized: None Level of assistance: Complete Independence Comments:  Improved quality of gait with reduced limp though still favors R LE with gait.  Appears to have a R hip drop.  Decreased  hip extension.  7/10 pain with ambulation.  TREATMENT DATE:    5/21  Ex bike warm up Muscle testing   LAQ 3x10 2 lbs   Neuro-re-ed  Supine march x10  Supine march w/ switch 2x10   Decompression 90/90 Heel press 3x10  DKTC 3x10 could feel sciatic pain    Step onto air-ex fwd and lateral 2x10        5/20  Trigger Point Dry Needling  Subsequent Treatment: Instructions provided previously at initial dry needling treatment.   Patient Verbal Consent Given: Yes Education Handout Provided: Previously Provided Muscles Treated:  R hip flexors 3x right gluteal 3x piriformis 1x, left upper trap 2x all using a .30x50 needle  Electrical Stimulation Performed: No Treatment Response/Outcome: twitch response, decrease in tissue tension Prone hip flexor stretch with PA glides grade II-III  Manual: trigger point release and skilled palpation performed to all muscles needled  Roller to anterior hip    07/15/23 Upright bike L2-3 x 6 mins Supine bridge with march 2x10 Modified plank x30 sec Prone plank 2x30 sec Side modified plank 2x10 sec bilat S/L hip abduction 2x10 Modified side plank 2x20 sec bilat Lateral band walks with GTB above knees 3x10-12 Retro walking with RTB around ankles x 2 laps Mini squats 2x13,14 without UE support       07/10/23 Manual: STM to R hip flexors, hamstrings, glutes pre and post dry needling for trigger point identification and muscular relaxation. Trigger Point Dry Needling  Subsequent Treatment: Instructions provided previously at initial dry needling treatment.   Patient Verbal Consent Given: Yes Education Handout Provided: Previously Provided Muscles Treated:  R hip flexors, hamstrings, glutes Electrical Stimulation Performed: No Treatment Response/Outcome: twitch response, decrease in tissue tension Prone hip  flexor stretch with PA glides grade II-III    5/9 Upright bike L2-3 x 6 mins Supine bridge with march 2x10 Qped birddogs 2x10 Modified planks 3x30 sec S/L hip abd 3x10 R LE, 2x10 L LE Lateral band walks with GTB at thighs x 3 laps at rail Retro walking with RTB around ankles x 2 laps Mini squats 2x12-15 without UE support         PATIENT EDUCATION:  Education details:  POC, rationale of exercises and exercise volume/intensity, rationale of interventions, relevant anatomy, sx response, and exercise form.  Person educated: Patient Education method: Explanation, Demonstration, Tactile cues, Verbal cues, Education comprehension: verbalized understanding, returned demonstration, verbal cues required, tactile cues required, and needs further education  HOME EXERCISE PROGRAM: Access Code: URL: [email or web address] Date: 04/30/2023 Prepared by: Marnie Siren  3-layer heel lift PRI Rt sidelying knee over knee (handout provided)   ASSESSMENT:  CLINICAL IMPRESSION: The patient reported pain into her hamstrings and in her sciatic nerve are today with her exercises. We advised her to continue as long as it doesn't become severe. We performed muscle testing today. She showed some improvement in her gluteal strength. She continues to have a deficit left to right which is to be expected. We reviewed decompression position exercises which still caused pain. We will continue to advance exercises as tolerated.     OBJECTIVE IMPAIRMENTS: Abnormal gait, decreased activity tolerance, decreased endurance, decreased mobility, difficulty walking, decreased ROM, decreased strength, hypomobility, and pain.   ACTIVITY LIMITATIONS: lifting, bending, sitting, standing, squatting, stairs, transfers, and locomotion level  PARTICIPATION LIMITATIONS: cleaning, laundry, driving, shopping, and community activity  PERSONAL FACTORS: Time since onset of injury/illness/exacerbation and  3+ comorbidities: R hip labral repair, L ACL repair x 2, cervical fusion x 2 are also affecting patient's functional  outcome.   REHAB POTENTIAL: Good  CLINICAL DECISION MAKING: Evolving/moderate complexity  EVALUATION COMPLEXITY: Moderate   GOALS:  SHORT TERM GOALS: Target date:  05/28/2023   Pt will be independent with HEP for improved pain, strength, tolerance to activity, and function.  Baseline: Goal status: INITIAL  2.  Pt will tolerate aquatic therapy without adverse effects in order to perform exercises in a reduced stress environment and improve functional mobility, tolerance to activity, strength, and pain.  Baseline:  Goal status: MET -05/24/23 Target date:  05/21/2023   3.  Pt's worst pain will be no > than 8/10 for improved tolerance to activity and mobility.  Baseline: 5/10 Goal status: MET - 05/24/23  4.  Pt will report at least a 25% improvement in her daily mobility.   Baseline: 20% Goal status: PARTIALLY MET - 05/24/23 and 4/22  5.  Pt will demonstrate improved quality of gait including improved Wb'ing thru R LE, reduced hip drop, and reduced limp.  Baseline:  Goal status: PARTIALLY MET  4/22  6.  Pt will perform a 6 inch step up with good form and stability without significant pain Baseline:  Goal status: MET -05/24/23 Target date:  06/04/2023  7.  Pt will ambulate with a normalized heel to toe gait pattern without limping.   Goal status:  PROGRESSING  4/22  Target date:  06/18/2023    LONG TERM GOALS: Target date: 08/20/2023   Pt will ambulate extended community distance without significant pain and difficulty.  Baseline:  Goal status: ONGOING  2.  Pt will ascend and descend stairs with a reciprocal gait with a rail.  Baseline:  Goal status: ONGOING  3.  Pt will be able to walk her neighborhood without significant pain. Baseline:  Goal status: In progress -06/13/23  4.  Pt will be able to perform her ADLs and IADLs including household chores without  significant pain and difficulty.  Baseline:  Goal status: ONGOING  5.  Pt will squat with symmetrical Wb'ing in bilat LE's and good form without increased pain for improved functional strength.  Baseline:  Goal status: INITIAL  6.  Pt will demo 4 to 4+/5 strength in R hip abd, 4/5 in R hip flex, and 5/5 in R knee ext for improved performance of and tolerance with functional mobility.  Baseline:  Goal status: INITIAL   PLAN:  PT FREQUENCY:   2-3x/wk  PT DURATION: 9 weeks  PLANNED INTERVENTIONS: 97164- PT Re-evaluation, 97110-Therapeutic exercises, 97530- Therapeutic activity, 97112- Neuromuscular re-education, 97535- Self Care, 44010- Manual therapy, [phone]- Gait training, [phone]- Aquatic Therapy, [phone]- Electrical stimulation (unattended), Patient/Family education, Balance training, Stair training, Taping, Dry Needling, Joint mobilization, Spinal mobilization, Scar mobilization, Cryotherapy, and Moist heat  PLAN FOR NEXT SESSION:  Cont with land based therapy.  Cont with DN and exercises per protocol per pt tolerance.    Signa Drier PT DPT 07/17/23 2:37 PM

## 2023-07-17 NOTE — Therapy (Signed)
 OUTPATIENT PHYSICAL THERAPY LOWER EXTREMITY TREATMENT /    Patient Name: [name] MRN: [id number] DOB:Sep 18, 1985, 38 y.o., female Today's Date: 07/17/2023  END OF SESSION:  PT End of Session - 07/17/23 1040     Visit Number 25    Number of Visits 36    Date for PT Re-Evaluation 08/20/23    Authorization Type [employer]    PT Start Time 1436   Patient 6 min late   PT Stop Time 1515    PT Time Calculation (min) 39 min    Activity Tolerance Patient tolerated treatment well    Behavior During Therapy WFL for tasks assessed/performed                          Past Medical History:  Diagnosis Date   ACL graft tear (HCC)    left knee   Allergy     Angio-edema    Anxiety    Complication of anesthesia    woke up during surgery   Depression    Family history of adverse reaction to anesthesia     mom is difficult to put to sleep   IUD (intrauterine device) in place    Knee pain, right    Migraine headache    Sleep paralysis    Ulcerative proctitis (HCC)    Past Surgical History:  Procedure Laterality Date   ANTERIOR CRUCIATE LIGAMENT REPAIR Left 08/10/2014   Procedure: ANTERIOR CRUCIATE LIGAMENT (ACL) REPAIR;  Surgeon: Genevie Kerns, MD;  Location: [facility] ;  Service: Orthopedics;  Laterality: Left;   HIP SURGERY Right 08/25/2019   PAO surgery [facility]   HIP SURGERY Bilateral 01/05/2021   [facility], had hardware taken out   KNEE ARTHROSCOPY Left 08/10/2014   Procedure: ARTHROSCOPY KNEE DEBRIDEMENT ALLOGRAFT ACL REVISION RECONSTRUCTION;  Surgeon: Genevie Kerns, MD;  Location: [facility];  Service: Orthopedics;  Laterality: Left;   KNEE ARTHROSCOPY W/ ACL RECONSTRUCTION Left 2006   SPINAL FUSION     widom tooth extraction     only had 1 wisdom tooth    Patient Active Problem List   Diagnosis Date Noted   Ulcerative proctitis with rectal bleeding (HCC) 06/16/2020   Bloating 06/16/2020   Chronic migraine without aura  without status migrainosus, not intractable 08/19/2018   Migraine with aura and with status migrainosus, not intractable 08/19/2018   Pain in left shoulder 08/19/2018   Pain in left ankle and joints of left foot 05/09/2018   Pain in left hip 05/09/2018   Cervicalgia 05/09/2018   Chronic rhinitis 11/12/2017   Angioedema 11/12/2017   Allergic reaction 11/12/2017   Chronic sinusitis 11/12/2017   Cervical intraepithelial neoplasia grade 1 10/01/2017   GAD (generalized anxiety disorder) 09/17/2014   ACL graft tear (HCC) 08/10/2014   S/P ACL reconstruction 08/10/2014   Migraine headache 05/15/2010     REFERRING PROVIDER: Allan Ishihara, MD  REFERRING DIAG: [phone] (ICD-10-CM) - Status post hip surgery / s/p arthroscopy of hip  S32.691K (ICD-10-CM) - Other specified fracture of right ischium, subsequent encounter for fracture with nonunion  THERAPY DIAG:  Pain in right hip  Muscle weakness (generalized)  Difficulty in walking, not elsewhere classified  Stiffness of right hip, not elsewhere classified  Rationale for Evaluation and Treatment: Rehabilitation  ONSET DATE: DOS  12/27/2022   SUBJECTIVE:   SUBJECTIVE STATEMENT: The patient was on a trip for several days. She did a lot of walking. She is very sore  in her anterior and posterior hip today. She also has a headache.        PERTINENT HISTORY: Hx of hip surgeries: -Revision R hip femoroplasty, arthroscopy with labral repair, open internal fixation post pelv ring Fx on 12/27/2022 -R hip arthroscopy with femoroplasty, labral repair, and pelvis osteotomy on 08/24/2019 (PAO)  -L hip labral repair and hip bone osteotomy, femoroplasty on 03/21/20 -hardware removal on 01/05/21 in bilat hips     L ACL repair x 2 in 06 and 2016 GAD Migraines Ulcerative colitis and celiac Mast cell activation syndrome Cervical fusion in 2020 and 2021   PAIN: Are you having any pain?  Yes  NPRS: 6/10 current, 8/10 worst, 4/10 best   Location:  R anterior hip, R glute, HS Type:  constant  PRECAUTIONS: Other: per surgical protocol, R hip labral repair, L ACL repair x 2, cervical fusion x 2   WEIGHT BEARING RESTRICTIONS: none indicated on orders  FALLS:  Has patient fallen in last 6 months? No  LIVING ENVIRONMENT: Lives with: lives with their spouse Lives in:  2 story home but moving into a 1 story Stairs: yes   PLOF: Independent  PATIENT GOALS: to be pain-free, improve ROM, improve quality of life    OBJECTIVE:  Note: Objective measures were completed at Evaluation unless otherwise noted.  DIAGNOSTIC FINDINGS: Pelvis x ray on 04/05/23:   Findings/Impression:  Hardware: Right acetabular plate and screw fixation without complication. Alignment: No dislocation. Bones: No acute fracture. Healing osteotomy of the right superior pubic ramus root. Joints: Mild degenerative changes of the left hip. Soft Tissues: Unremarkable.    PATIENT SURVEYS:  LEFS Initial/current:  41/80 / 35/80  COGNITION: Overall cognitive status: Within functional limits for tasks assessed       LOWER EXTREMITY ROM:  Active ROM Right eval Left eval Right 4/22  Hip flexion 105 118 104  Hip extension     Hip abduction 22 30 28   Hip adduction     Hip internal rotation 42 24   Hip external rotation 24 40 32  Knee flexion     Knee extension     Ankle dorsiflexion     Ankle plantarflexion     Ankle inversion     Ankle eversion      (Blank rows = not tested)  LOWER EXTREMITY HHD:  Hip abd (seated):  R:  11.3, L:  16.9   GAIT: Assistive device utilized: None Level of assistance: Complete Independence Comments:  Improved quality of gait with reduced limp though still favors R LE with gait.  Appears to have a R hip drop.  Decreased hip extension.  7/10 pain with ambulation.                                                                                                                                TREATMENT DATE:    5/21  Trigger Point Dry Needling  Subsequent Treatment: Instructions provided  previously at initial dry needling treatment.   Patient Verbal Consent Given: Yes Education Handout Provided: Previously Provided Muscles Treated:  R hip flexors 3x right gluteal 3x piriformis 1x, left upper trap 2x all using a .30x50 needle  Electrical Stimulation Performed: No Treatment Response/Outcome: twitch response, decrease in tissue tension Prone hip flexor stretch with PA glides grade II-III  Manual: trigger point release and skilled palpation performed to all muscles needled  Roller to anterior hip    07/15/23 Upright bike L2-3 x 6 mins Supine bridge with march 2x10 Modified plank x30 sec Prone plank 2x30 sec Side modified plank 2x10 sec bilat S/L hip abduction 2x10 Modified side plank 2x20 sec bilat Lateral band walks with GTB above knees 3x10-12 Retro walking with RTB around ankles x 2 laps Mini squats 2x13,14 without UE support       07/10/23 Manual: STM to R hip flexors, hamstrings, glutes pre and post dry needling for trigger point identification and muscular relaxation. Trigger Point Dry Needling  Subsequent Treatment: Instructions provided previously at initial dry needling treatment.   Patient Verbal Consent Given: Yes Education Handout Provided: Previously Provided Muscles Treated:  R hip flexors, hamstrings, glutes Electrical Stimulation Performed: No Treatment Response/Outcome: twitch response, decrease in tissue tension Prone hip flexor stretch with PA glides grade II-III    5/9 Upright bike L2-3 x 6 mins Supine bridge with march 2x10 Qped birddogs 2x10 Modified planks 3x30 sec S/L hip abd 3x10 R LE, 2x10 L LE Lateral band walks with GTB at thighs x 3 laps at rail Retro walking with RTB around ankles x 2 laps Mini squats 2x12-15 without UE support         PATIENT EDUCATION:  Education details:  POC, rationale of exercises and exercise volume/intensity,  rationale of interventions, relevant anatomy, sx response, and exercise form.  Person educated: Patient Education method: Explanation, Demonstration, Tactile cues, Verbal cues, Education comprehension: verbalized understanding, returned demonstration, verbal cues required, tactile cues required, and needs further education  HOME EXERCISE PROGRAM: Access Code: URL: [email or web address] Date: 04/30/2023 Prepared by: Marnie Siren  3-layer heel lift PRI Rt sidelying knee over knee (handout provided)   ASSESSMENT:  CLINICAL IMPRESSION: The patient came in following an acute flair-up of symptoms. Therapy focused on needling and soft tissue mobilization. Therapy will continue to progress exercises next visit. She reported feeling better following treatment.      OBJECTIVE IMPAIRMENTS: Abnormal gait, decreased activity tolerance, decreased endurance, decreased mobility, difficulty walking, decreased ROM, decreased strength, hypomobility, and pain.   ACTIVITY LIMITATIONS: lifting, bending, sitting, standing, squatting, stairs, transfers, and locomotion level  PARTICIPATION LIMITATIONS: cleaning, laundry, driving, shopping, and community activity  PERSONAL FACTORS: Time since onset of injury/illness/exacerbation and 3+ comorbidities: R hip labral repair, L ACL repair x 2, cervical fusion x 2 are also affecting patient's functional outcome.   REHAB POTENTIAL: Good  CLINICAL DECISION MAKING: Evolving/moderate complexity  EVALUATION COMPLEXITY: Moderate   GOALS:  SHORT TERM GOALS: Target date:  05/28/2023   Pt will be independent with HEP for improved pain, strength, tolerance to activity, and function.  Baseline: Goal status: INITIAL  2.  Pt will tolerate aquatic therapy without adverse effects in order to perform exercises in a reduced stress environment and improve functional mobility, tolerance to activity, strength, and pain.  Baseline:  Goal status: MET  -05/24/23 Target date:  05/21/2023   3.  Pt's worst pain will be no > than 8/10 for improved tolerance to activity and mobility.  Baseline: 5/10  Goal status: MET - 05/24/23  4.  Pt will report at least a 25% improvement in her daily mobility.   Baseline: 20% Goal status: PARTIALLY MET - 05/24/23 and 4/22  5.  Pt will demonstrate improved quality of gait including improved Wb'ing thru R LE, reduced hip drop, and reduced limp.  Baseline:  Goal status: PARTIALLY MET  4/22  6.  Pt will perform a 6 inch step up with good form and stability without significant pain Baseline:  Goal status: MET -05/24/23 Target date:  06/04/2023  7.  Pt will ambulate with a normalized heel to toe gait pattern without limping.   Goal status:  PROGRESSING  4/22  Target date:  06/18/2023    LONG TERM GOALS: Target date: 08/20/2023   Pt will ambulate extended community distance without significant pain and difficulty.  Baseline:  Goal status: ONGOING  2.  Pt will ascend and descend stairs with a reciprocal gait with a rail.  Baseline:  Goal status: ONGOING  3.  Pt will be able to walk her neighborhood without significant pain. Baseline:  Goal status: In progress -06/13/23  4.  Pt will be able to perform her ADLs and IADLs including household chores without significant pain and difficulty.  Baseline:  Goal status: ONGOING  5.  Pt will squat with symmetrical Wb'ing in bilat LE's and good form without increased pain for improved functional strength.  Baseline:  Goal status: INITIAL  6.  Pt will demo 4 to 4+/5 strength in R hip abd, 4/5 in R hip flex, and 5/5 in R knee ext for improved performance of and tolerance with functional mobility.  Baseline:  Goal status: INITIAL   PLAN:  PT FREQUENCY:   2-3x/wk  PT DURATION: 9 weeks  PLANNED INTERVENTIONS: 97164- PT Re-evaluation, 97110-Therapeutic exercises, 97530- Therapeutic activity, 97112- Neuromuscular re-education, 97535- Self Care, 16109- Manual  therapy, [phone]- Gait training, [phone]- Aquatic Therapy, [phone]- Electrical stimulation (unattended), Patient/Family education, Balance training, Stair training, Taping, Dry Needling, Joint mobilization, Spinal mobilization, Scar mobilization, Cryotherapy, and Moist heat  PLAN FOR NEXT SESSION:  Cont with land based therapy.  Cont with DN and exercises per protocol per pt tolerance.    Signa Drier PT DPT 07/17/23 10:43 AM

## 2023-07-18 ENCOUNTER — Encounter (HOSPITAL_BASED_OUTPATIENT_CLINIC_OR_DEPARTMENT_OTHER): Admitting: Physical Therapy

## 2023-07-23 ENCOUNTER — Ambulatory Visit (HOSPITAL_BASED_OUTPATIENT_CLINIC_OR_DEPARTMENT_OTHER): Admitting: Physical Therapy

## 2023-07-23 NOTE — Therapy (Incomplete)
 OUTPATIENT PHYSICAL THERAPY LOWER EXTREMITY TREATMENT /    Patient Name: [name] MRN: [id number] DOB:04/13/1985, 38 y.o., female Today's Date: 07/23/2023  END OF SESSION:                 Past Medical History:  Diagnosis Date   ACL graft tear (HCC)    left knee   Allergy     Angio-edema    Anxiety    Complication of anesthesia    woke up during surgery   Depression    Family history of adverse reaction to anesthesia     mom is difficult to put to sleep   IUD (intrauterine device) in place    Knee pain, right    Migraine headache    Sleep paralysis    Ulcerative proctitis [facility])    Past Surgical History:  Procedure Laterality Date   ANTERIOR CRUCIATE LIGAMENT REPAIR Left 08/10/2014   Procedure: ANTERIOR CRUCIATE LIGAMENT (ACL) REPAIR;  Surgeon: Genevie Kerns, MD;  Location: [facility];  Service: Orthopedics;  Laterality: Left;   HIP SURGERY Right 08/25/2019   PAO surgery [facility]   HIP SURGERY Bilateral 01/05/2021   [facility], had hardware taken out   KNEE ARTHROSCOPY Left 08/10/2014   Procedure: ARTHROSCOPY KNEE DEBRIDEMENT ALLOGRAFT ACL REVISION RECONSTRUCTION;  Surgeon: Genevie Kerns, MD;  Location: [facility];  Service: Orthopedics;  Laterality: Left;   KNEE ARTHROSCOPY W/ ACL RECONSTRUCTION Left 2006   SPINAL FUSION     widom tooth extraction     only had 1 wisdom tooth    Patient Active Problem List   Diagnosis Date Noted   Ulcerative proctitis with rectal bleeding (HCC) 06/16/2020   Bloating 06/16/2020   Chronic migraine without aura without status migrainosus, not intractable 08/19/2018   Migraine with aura and with status migrainosus, not intractable 08/19/2018   Pain in left shoulder 08/19/2018   Pain in left ankle and joints of left foot 05/09/2018   Pain in left hip 05/09/2018   Cervicalgia 05/09/2018   Chronic rhinitis 11/12/2017   Angioedema 11/12/2017   Allergic reaction 11/12/2017   Chronic  sinusitis 11/12/2017   Cervical intraepithelial neoplasia grade 1 10/01/2017   GAD (generalized anxiety disorder) 09/17/2014   ACL graft tear (HCC) 08/10/2014   S/P ACL reconstruction 08/10/2014   Migraine headache 05/15/2010     REFERRING PROVIDER: Allan Ishihara, MD  REFERRING DIAG: [phone] (ICD-10-CM) - Status post hip surgery / s/p arthroscopy of hip  S32.691K (ICD-10-CM) - Other specified fracture of right ischium, subsequent encounter for fracture with nonunion  THERAPY DIAG:  No diagnosis found.  Rationale for Evaluation and Treatment: Rehabilitation  ONSET DATE: DOS  12/27/2022   SUBJECTIVE:   SUBJECTIVE STATEMENT: ***     PERTINENT HISTORY: Hx of hip surgeries: -Revision R hip femoroplasty, arthroscopy with labral repair, open internal fixation post pelv ring Fx on 12/27/2022 -R hip arthroscopy with femoroplasty, labral repair, and pelvis osteotomy on 08/24/2019 (PAO)  -L hip labral repair and hip bone osteotomy, femoroplasty on 03/21/20 -hardware removal on 01/05/21 in bilat hips     L ACL repair x 2 in 06 and 2016 GAD Migraines Ulcerative colitis and celiac Mast cell activation syndrome Cervical fusion in 2020 and 2021   PAIN: Are you having any pain?  Yes  NPRS: 6/10 current, 8/10 worst, 4/10 best  Location:  R anterior hip, R glute, HS Type:  constant  PRECAUTIONS: Other: per surgical protocol, R hip labral repair, L ACL  repair x 2, cervical fusion x 2   WEIGHT BEARING RESTRICTIONS: none indicated on orders  FALLS:  Has patient fallen in last 6 months? No  LIVING ENVIRONMENT: Lives with: lives with their spouse Lives in:  2 story home but moving into a 1 story Stairs: yes   PLOF: Independent  PATIENT GOALS: to be pain-free, improve ROM, improve quality of life    OBJECTIVE:  Note: Objective measures were completed at Evaluation unless otherwise noted.  DIAGNOSTIC FINDINGS: Pelvis x ray on 04/05/23:    Findings/Impression:  Hardware: Right acetabular plate and screw fixation without complication. Alignment: No dislocation. Bones: No acute fracture. Healing osteotomy of the right superior pubic ramus root. Joints: Mild degenerative changes of the left hip. Soft Tissues: Unremarkable.    PATIENT SURVEYS:  LEFS Initial/current:  41/80 / 35/80  COGNITION: Overall cognitive status: Within functional limits for tasks assessed       LOWER EXTREMITY ROM:  Active ROM Right eval Left eval Right 4/22  Hip flexion 105 118 104  Hip extension     Hip abduction 22 30 28   Hip adduction     Hip internal rotation 42 24   Hip external rotation 24 40 32  Knee flexion     Knee extension     Ankle dorsiflexion     Ankle plantarflexion     Ankle inversion     Ankle eversion      (Blank rows = not tested)  LOWER EXTREMITY HHD:  Hip abd (seated):  R:  11.3, L:  16.9 LOWER EXTREMITY MMT:    MMT Right  Left   Hip flexion 11.6 18.6  Hip extension    Hip abduction 18.9 21.4  Hip adduction    Hip internal rotation    Hip external rotation    Knee flexion    Knee extension 23.8 32.6  Ankle dorsiflexion    Ankle plantarflexion    Ankle inversion    Ankle eversion     (Blank rows = not tested)   GAIT: Assistive device utilized: None Level of assistance: Complete Independence Comments:  Improved quality of gait with reduced limp though still favors R LE with gait.  Appears to have a R hip drop.  Decreased hip extension.  7/10 pain with ambulation.                                                                                                                                TREATMENT DATE:    5/27: ***  5/21  Ex bike warm up Muscle testing   LAQ 3x10 2 lbs   Neuro-re-ed  Supine march x10  Supine march w/ switch 2x10   Decompression 90/90 Heel press 3x10  DKTC 3x10 could feel sciatic pain    Step onto air-ex fwd and lateral 2x10          PATIENT  EDUCATION:  Education details:  POC, rationale of exercises and exercise  volume/intensity, rationale of interventions, relevant anatomy, sx response, and exercise form.  Person educated: Patient Education method: Explanation, Demonstration, Tactile cues, Verbal cues, Education comprehension: verbalized understanding, returned demonstration, verbal cues required, tactile cues required, and needs further education  HOME EXERCISE PROGRAM: Access Code: URL: [email or web address] Date: 04/30/2023 Prepared by: Marnie Siren  3-layer heel lift PRI Rt sidelying knee over knee (handout provided)   ASSESSMENT:  CLINICAL IMPRESSION: ***    OBJECTIVE IMPAIRMENTS: Abnormal gait, decreased activity tolerance, decreased endurance, decreased mobility, difficulty walking, decreased ROM, decreased strength, hypomobility, and pain.   ACTIVITY LIMITATIONS: lifting, bending, sitting, standing, squatting, stairs, transfers, and locomotion level  PARTICIPATION LIMITATIONS: cleaning, laundry, driving, shopping, and community activity  PERSONAL FACTORS: Time since onset of injury/illness/exacerbation and 3+ comorbidities: R hip labral repair, L ACL repair x 2, cervical fusion x 2 are also affecting patient's functional outcome.   REHAB POTENTIAL: Good  CLINICAL DECISION MAKING: Evolving/moderate complexity  EVALUATION COMPLEXITY: Moderate   GOALS:  SHORT TERM GOALS: Target date:  05/28/2023   Pt will be independent with HEP for improved pain, strength, tolerance to activity, and function.  Baseline: Goal status: INITIAL  2.  Pt will tolerate aquatic therapy without adverse effects in order to perform exercises in a reduced stress environment and improve functional mobility, tolerance to activity, strength, and pain.  Baseline:  Goal status: MET -05/24/23 Target date:  05/21/2023   3.  Pt's worst pain will be no > than 8/10 for improved tolerance to activity and mobility.   Baseline: 5/10 Goal status: MET - 05/24/23  4.  Pt will report at least a 25% improvement in her daily mobility.   Baseline: 20% Goal status: PARTIALLY MET - 05/24/23 and 4/22  5.  Pt will demonstrate improved quality of gait including improved Wb'ing thru R LE, reduced hip drop, and reduced limp.  Baseline:  Goal status: PARTIALLY MET  4/22  6.  Pt will perform a 6 inch step up with good form and stability without significant pain Baseline:  Goal status: MET -05/24/23 Target date:  06/04/2023  7.  Pt will ambulate with a normalized heel to toe gait pattern without limping.   Goal status:  PROGRESSING  4/22  Target date:  06/18/2023    LONG TERM GOALS: Target date: 08/20/2023   Pt will ambulate extended community distance without significant pain and difficulty.  Baseline:  Goal status: ONGOING  2.  Pt will ascend and descend stairs with a reciprocal gait with a rail.  Baseline:  Goal status: ONGOING  3.  Pt will be able to walk her neighborhood without significant pain. Baseline:  Goal status: In progress -06/13/23  4.  Pt will be able to perform her ADLs and IADLs including household chores without significant pain and difficulty.  Baseline:  Goal status: ONGOING  5.  Pt will squat with symmetrical Wb'ing in bilat LE's and good form without increased pain for improved functional strength.  Baseline:  Goal status: INITIAL  6.  Pt will demo 4 to 4+/5 strength in R hip abd, 4/5 in R hip flex, and 5/5 in R knee ext for improved performance of and tolerance with functional mobility.  Baseline:  Goal status: INITIAL   PLAN:  PT FREQUENCY:   2-3x/wk  PT DURATION: 9 weeks  PLANNED INTERVENTIONS: 97164- PT Re-evaluation, 97110-Therapeutic exercises, 97530- Therapeutic activity, [id number]- Neuromuscular re-education, 97535- Self Care, 16109- Manual therapy, [phone]- Gait training, [phone]- Aquatic Therapy, [phone]- Electrical stimulation (unattended), Patient/Family education, Balance  training,  Stair training, Taping, Dry Needling, Joint mobilization, Spinal mobilization, Scar mobilization, Cryotherapy, and Moist heat  PLAN FOR NEXT SESSION:  Cont with land based therapy.  Cont with DN and exercises per protocol per pt tolerance.    Knoxx Boeding C. Rio Taber PT, DPT 07/23/23 7:58 AM

## 2023-07-24 ENCOUNTER — Ambulatory Visit (HOSPITAL_BASED_OUTPATIENT_CLINIC_OR_DEPARTMENT_OTHER): Admitting: Physical Therapy

## 2023-07-24 ENCOUNTER — Encounter (HOSPITAL_BASED_OUTPATIENT_CLINIC_OR_DEPARTMENT_OTHER): Payer: Self-pay | Admitting: Physical Therapy

## 2023-07-24 DIAGNOSIS — R262 Difficulty in walking, not elsewhere classified: Secondary | ICD-10-CM

## 2023-07-24 DIAGNOSIS — M25651 Stiffness of right hip, not elsewhere classified: Secondary | ICD-10-CM

## 2023-07-24 DIAGNOSIS — M25551 Pain in right hip: Secondary | ICD-10-CM

## 2023-07-24 DIAGNOSIS — M6281 Muscle weakness (generalized): Secondary | ICD-10-CM

## 2023-07-24 NOTE — Therapy (Signed)
 OUTPATIENT PHYSICAL THERAPY LOWER EXTREMITY TREATMENT /    Patient Name: [name] MRN: [id number] DOB:09-06-1985, 38 y.o., female Today's Date: 07/25/2023  END OF SESSION:  PT End of Session - 07/24/23 1412     Visit Number 27    Number of Visits 36    Date for PT Re-Evaluation 08/20/23    Authorization Type [employer]    PT Start Time 1409    PT Stop Time 1450    PT Time Calculation (min) 41 min    Activity Tolerance Patient tolerated treatment well    Behavior During Therapy WFL for tasks assessed/performed                          Past Medical History:  Diagnosis Date   ACL graft tear (HCC)    left knee   Allergy     Angio-edema    Anxiety    Complication of anesthesia    woke up during surgery   Depression    Family history of adverse reaction to anesthesia     mom is difficult to put to sleep   IUD (intrauterine device) in place    Knee pain, right    Migraine headache    Sleep paralysis    Ulcerative proctitis (HCC)    Past Surgical History:  Procedure Laterality Date   ANTERIOR CRUCIATE LIGAMENT REPAIR Left 08/10/2014   Procedure: ANTERIOR CRUCIATE LIGAMENT (ACL) REPAIR;  Surgeon: Genevie Kerns, MD;  Location: [facility];  Service: Orthopedics;  Laterality: Left;   HIP SURGERY Right 08/25/2019   PAO surgery [facility]   HIP SURGERY Bilateral 01/05/2021   [facility], had hardware taken out   KNEE ARTHROSCOPY Left 08/10/2014   Procedure: ARTHROSCOPY KNEE DEBRIDEMENT ALLOGRAFT ACL REVISION RECONSTRUCTION;  Surgeon: Genevie Kerns, MD;  Location: [facility];  Service: Orthopedics;  Laterality: Left;   KNEE ARTHROSCOPY W/ ACL RECONSTRUCTION Left 2006   SPINAL FUSION     widom tooth extraction     only had 1 wisdom tooth    Patient Active Problem List   Diagnosis Date Noted   Ulcerative proctitis with rectal bleeding (HCC) 06/16/2020   Bloating 06/16/2020   Chronic migraine without aura without status  migrainosus, not intractable 08/19/2018   Migraine with aura and with status migrainosus, not intractable 08/19/2018   Pain in left shoulder 08/19/2018   Pain in left ankle and joints of left foot 05/09/2018   Pain in left hip 05/09/2018   Cervicalgia 05/09/2018   Chronic rhinitis 11/12/2017   Angioedema 11/12/2017   Allergic reaction 11/12/2017   Chronic sinusitis 11/12/2017   Cervical intraepithelial neoplasia grade 1 10/01/2017   GAD (generalized anxiety disorder) 09/17/2014   ACL graft tear (HCC) 08/10/2014   S/P ACL reconstruction 08/10/2014   Migraine headache 05/15/2010     REFERRING PROVIDER: Allan Ishihara, MD  REFERRING DIAG: [phone] (ICD-10-CM) - Status post hip surgery / s/p arthroscopy of hip  S32.691K (ICD-10-CM) - Other specified fracture of right ischium, subsequent encounter for fracture with nonunion  THERAPY DIAG:  Pain in right hip  Muscle weakness (generalized)  Difficulty in walking, not elsewhere classified  Stiffness of right hip, not elsewhere classified  Rationale for Evaluation and Treatment: Rehabilitation  ONSET DATE: DOS  12/27/2022   SUBJECTIVE:   SUBJECTIVE STATEMENT: Pt was in the car for 7 hours yesterday and walked over 3 miles.  She is hurting more today.  Pt states her  walking is still not great, but she tolerated it better.  Pt reports feeling a deep, bruised pain in anterior hip not a sharp pulling after walking yesterday.  Pt still has sx's in anterior hip though not as scary.        PERTINENT HISTORY: Hx of hip surgeries: -Revision R hip femoroplasty, arthroscopy with labral repair, open internal fixation post pelv ring Fx on 12/27/2022 -R hip arthroscopy with femoroplasty, labral repair, and pelvis osteotomy on 08/24/2019 (PAO)  -L hip labral repair and hip bone osteotomy, femoroplasty on 03/21/20 -hardware removal on 01/05/21 in bilat hips     L ACL repair x 2 in 06 and 2016 GAD Migraines Ulcerative colitis and  celiac Mast cell activation syndrome Cervical fusion in 2020 and 2021   PAIN: Are you having any pain?  Yes  NPRS: 6/10 current, 8/10 worst, 4/10 best  Location:  R anterior hip, R glute, HS, PSIS area Type:  constant  PRECAUTIONS: Other: per surgical protocol, R hip labral repair, L ACL repair x 2, cervical fusion x 2   WEIGHT BEARING RESTRICTIONS: none indicated on orders  FALLS:  Has patient fallen in last 6 months? No  LIVING ENVIRONMENT: Lives with: lives with their spouse Lives in:  2 story home but moving into a 1 story Stairs: yes   PLOF: Independent  PATIENT GOALS: to be pain-free, improve ROM, improve quality of life    OBJECTIVE:  Note: Objective measures were completed at Evaluation unless otherwise noted.  DIAGNOSTIC FINDINGS: Pelvis x ray on 04/05/23:   Findings/Impression:  Hardware: Right acetabular plate and screw fixation without complication. Alignment: No dislocation. Bones: No acute fracture. Healing osteotomy of the right superior pubic ramus root. Joints: Mild degenerative changes of the left hip. Soft Tissues: Unremarkable.    PATIENT SURVEYS:  LEFS Initial/current:  41/80 / 35/80  COGNITION: Overall cognitive status: Within functional limits for tasks assessed       LOWER EXTREMITY ROM:  Active ROM Right eval Left eval Right 4/22  Hip flexion 105 118 104  Hip extension     Hip abduction 22 30 28   Hip adduction     Hip internal rotation 42 24   Hip external rotation 24 40 32  Knee flexion     Knee extension     Ankle dorsiflexion     Ankle plantarflexion     Ankle inversion     Ankle eversion      (Blank rows = not tested)  LOWER EXTREMITY HHD:  Hip abd (seated):  R:  11.3, L:  16.9 LOWER EXTREMITY MMT:    MMT Right  Left   Hip flexion 11.6 18.6  Hip extension    Hip abduction 18.9 21.4  Hip adduction    Hip internal rotation    Hip external rotation    Knee flexion    Knee extension 23.8 32.6  Ankle  dorsiflexion    Ankle plantarflexion    Ankle inversion    Ankle eversion     (Blank rows = not tested)   GAIT: Assistive device utilized: None Level of assistance: Complete Independence Comments:  Improved quality of gait with reduced limp though still favors R LE with gait.  Appears to have a R hip drop.  Decreased hip extension.  7/10 pain with ambulation.  TREATMENT DATE:    5/28 Upright bike L2-3 x 6 mins Lateral band walks with GTB above knees 3x10 Retro walking with RTB around ankles x 2 laps Monster walks with RTB x 2 laps  Step onto air-ex fwd and lateral approx 12-15  Prone plank 2x30 sec Side planks 2x20 sec bilat  Q-ped birddogs   5/21  Ex bike warm up Muscle testing   LAQ 3x10 2 lbs   Neuro-re-ed  Supine march x10  Supine march w/ switch 2x10   Decompression 90/90 Heel press 3x10  DKTC 3x10 could feel sciatic pain    Step onto air-ex fwd and lateral 2x10    5/20  Trigger Point Dry Needling  Subsequent Treatment: Instructions provided previously at initial dry needling treatment.   Patient Verbal Consent Given: Yes Education Handout Provided: Previously Provided Muscles Treated:  R hip flexors 3x right gluteal 3x piriformis 1x, left upper trap 2x all using a .30x50 needle  Electrical Stimulation Performed: No Treatment Response/Outcome: twitch response, decrease in tissue tension Prone hip flexor stretch with PA glides grade II-III  Manual: trigger point release and skilled palpation performed to all muscles needled  Roller to anterior hip    07/15/23 Upright bike L2-3 x 6 mins Supine bridge with march 2x10 Modified plank x30 sec Prone plank 2x30 sec Side modified plank 2x10 sec bilat S/L hip abduction 2x10 Modified side plank 2x20 sec bilat Lateral band walks with GTB above knees 3x10-12 Retro walking with  RTB around ankles x 2 laps Mini squats 2x13,14 without UE support       07/10/23 Manual: STM to R hip flexors, hamstrings, glutes pre and post dry needling for trigger point identification and muscular relaxation. Trigger Point Dry Needling  Subsequent Treatment: Instructions provided previously at initial dry needling treatment.   Patient Verbal Consent Given: Yes Education Handout Provided: Previously Provided Muscles Treated:  R hip flexors, hamstrings, glutes Electrical Stimulation Performed: No Treatment Response/Outcome: twitch response, decrease in tissue tension Prone hip flexor stretch with PA glides grade II-III    5/9 Upright bike L2-3 x 6 mins Supine bridge with march 2x10 Qped birddogs 2x10 Modified planks 3x30 sec S/L hip abd 3x10 R LE, 2x10 L LE Lateral band walks with GTB at thighs x 3 laps at rail Retro walking with RTB around ankles x 2 laps Mini squats 2x12-15 without UE support         PATIENT EDUCATION:  Education details:  POC, rationale of exercises and exercise volume/intensity, rationale of interventions, relevant anatomy, sx response, and exercise form.  Person educated: Patient Education method: Explanation, Demonstration, Tactile cues, Verbal cues, Education comprehension: verbalized understanding, returned demonstration, verbal cues required, tactile cues required, and needs further education  HOME EXERCISE PROGRAM: Access Code: URL: [email or web address] Date: 04/30/2023 Prepared by: Marnie Siren  3-layer heel lift PRI Rt sidelying knee over knee (handout provided)   ASSESSMENT:  CLINICAL IMPRESSION: Pt continues to have sx's in anterior hip though has made some progress.  Pt had to do a lot of walking, but also a lot of sitting in the car yesterday which bothered her hip.  Pt gives great effort with all exercises and is improving with exercise tolerance.  She is improving with LE and core strength as  evidenced by performance and progression of exercises.  Pt reports no change in pain after Rx, still 6/10.      OBJECTIVE IMPAIRMENTS: Abnormal gait, decreased activity tolerance, decreased endurance, decreased mobility, difficulty walking, decreased ROM, decreased  strength, hypomobility, and pain.   ACTIVITY LIMITATIONS: lifting, bending, sitting, standing, squatting, stairs, transfers, and locomotion level  PARTICIPATION LIMITATIONS: cleaning, laundry, driving, shopping, and community activity  PERSONAL FACTORS: Time since onset of injury/illness/exacerbation and 3+ comorbidities: R hip labral repair, L ACL repair x 2, cervical fusion x 2 are also affecting patient's functional outcome.   REHAB POTENTIAL: Good  CLINICAL DECISION MAKING: Evolving/moderate complexity  EVALUATION COMPLEXITY: Moderate   GOALS:  SHORT TERM GOALS: Target date:  05/28/2023   Pt will be independent with HEP for improved pain, strength, tolerance to activity, and function.  Baseline: Goal status: INITIAL  2.  Pt will tolerate aquatic therapy without adverse effects in order to perform exercises in a reduced stress environment and improve functional mobility, tolerance to activity, strength, and pain.  Baseline:  Goal status: MET -05/24/23 Target date:  05/21/2023   3.  Pt's worst pain will be no > than 8/10 for improved tolerance to activity and mobility.  Baseline: 5/10 Goal status: MET - 05/24/23  4.  Pt will report at least a 25% improvement in her daily mobility.   Baseline: 20% Goal status: PARTIALLY MET - 05/24/23 and 4/22  5.  Pt will demonstrate improved quality of gait including improved Wb'ing thru R LE, reduced hip drop, and reduced limp.  Baseline:  Goal status: PARTIALLY MET  4/22  6.  Pt will perform a 6 inch step up with good form and stability without significant pain Baseline:  Goal status: MET -05/24/23 Target date:  06/04/2023  7.  Pt will ambulate with a normalized heel to toe  gait pattern without limping.   Goal status:  PROGRESSING  4/22  Target date:  06/18/2023    LONG TERM GOALS: Target date: 08/20/2023   Pt will ambulate extended community distance without significant pain and difficulty.  Baseline:  Goal status: ONGOING  2.  Pt will ascend and descend stairs with a reciprocal gait with a rail.  Baseline:  Goal status: ONGOING  3.  Pt will be able to walk her neighborhood without significant pain. Baseline:  Goal status: In progress -06/13/23  4.  Pt will be able to perform her ADLs and IADLs including household chores without significant pain and difficulty.  Baseline:  Goal status: ONGOING  5.  Pt will squat with symmetrical Wb'ing in bilat LE's and good form without increased pain for improved functional strength.  Baseline:  Goal status: INITIAL  6.  Pt will demo 4 to 4+/5 strength in R hip abd, 4/5 in R hip flex, and 5/5 in R knee ext for improved performance of and tolerance with functional mobility.  Baseline:  Goal status: INITIAL   PLAN:  PT FREQUENCY:   2-3x/wk  PT DURATION: 9 weeks  PLANNED INTERVENTIONS: 97164- PT Re-evaluation, 97110-Therapeutic exercises, 97530- Therapeutic activity, 97112- Neuromuscular re-education, 97535- Self Care, 16109- Manual therapy, [phone]- Gait training, [phone]- Aquatic Therapy, [phone]- Electrical stimulation (unattended), Patient/Family education, Balance training, Stair training, Taping, Dry Needling, Joint mobilization, Spinal mobilization, Scar mobilization, Cryotherapy, and Moist heat  PLAN FOR NEXT SESSION:  Cont with land based therapy.  Cont with DN and exercises per protocol per pt tolerance.    Trina Fujita III PT, DPT 07/25/23 5:24 PM

## 2023-07-26 ENCOUNTER — Ambulatory Visit (HOSPITAL_BASED_OUTPATIENT_CLINIC_OR_DEPARTMENT_OTHER): Admitting: Physical Therapy

## 2023-07-26 ENCOUNTER — Encounter (HOSPITAL_BASED_OUTPATIENT_CLINIC_OR_DEPARTMENT_OTHER): Payer: Self-pay | Admitting: Physical Therapy

## 2023-07-26 DIAGNOSIS — M25651 Stiffness of right hip, not elsewhere classified: Secondary | ICD-10-CM

## 2023-07-26 DIAGNOSIS — M6281 Muscle weakness (generalized): Secondary | ICD-10-CM

## 2023-07-26 DIAGNOSIS — M25551 Pain in right hip: Secondary | ICD-10-CM | POA: Diagnosis not present

## 2023-07-26 DIAGNOSIS — R262 Difficulty in walking, not elsewhere classified: Secondary | ICD-10-CM

## 2023-07-26 NOTE — Therapy (Signed)
 OUTPATIENT PHYSICAL THERAPY LOWER EXTREMITY TREATMENT /    Patient Name: [name] MRN: [id number] DOB:[birth year], 38 y.o., female Today's Date: 07/26/2023  END OF SESSION:  PT End of Session - 07/26/23 1308     Visit Number 28    Number of Visits 36    Date for PT Re-Evaluation 08/20/23    Authorization Type UHC    PT Start Time 1305    PT Stop Time 1349    PT Time Calculation (min) 44 min    Activity Tolerance Patient tolerated treatment well    Behavior During Therapy WFL for tasks assessed/performed                           Past Medical History:  Diagnosis Date   ACL graft tear (HCC)    left knee   Allergy     Angio-edema    Anxiety    Complication of anesthesia    woke up during surgery   Depression    Family history of adverse reaction to anesthesia     mom is difficult to put to sleep   IUD (intrauterine device) in place    Knee pain, right    Migraine headache    Sleep paralysis    Ulcerative proctitis (HCC)    Past Surgical History:  Procedure Laterality Date   ANTERIOR CRUCIATE LIGAMENT REPAIR Left 08/10/2014   Procedure: ANTERIOR CRUCIATE LIGAMENT (ACL) REPAIR;  Surgeon: Genevie Kerns, MD;  Location: [facility];  Service: Orthopedics;  Laterality: Left;   HIP SURGERY Right 08/25/2019   PAO surgery [facility]   HIP SURGERY Bilateral 01/05/2021   [facility], had hardware taken out   KNEE ARTHROSCOPY Left 08/10/2014   Procedure: ARTHROSCOPY KNEE DEBRIDEMENT ALLOGRAFT ACL REVISION RECONSTRUCTION;  Surgeon: Genevie Kerns, MD;  Location: [facility];  Service: Orthopedics;  Laterality: Left;   KNEE ARTHROSCOPY W/ ACL RECONSTRUCTION Left 2006   SPINAL FUSION     widom tooth extraction     only had 1 wisdom tooth    Patient Active Problem List   Diagnosis Date Noted   Ulcerative proctitis with rectal bleeding (HCC) 06/16/2020   Bloating 06/16/2020   Chronic migraine without aura without status  migrainosus, not intractable 08/19/2018   Migraine with aura and with status migrainosus, not intractable 08/19/2018   Pain in left shoulder 08/19/2018   Pain in left ankle and joints of left foot 05/09/2018   Pain in left hip 05/09/2018   Cervicalgia 05/09/2018   Chronic rhinitis 11/12/2017   Angioedema 11/12/2017   Allergic reaction 11/12/2017   Chronic sinusitis 11/12/2017   Cervical intraepithelial neoplasia grade 1 10/01/2017   GAD (generalized anxiety disorder) 09/17/2014   ACL graft tear (HCC) 08/10/2014   S/P ACL reconstruction 08/10/2014   Migraine headache 05/15/2010     REFERRING PROVIDER: Allan Ishihara, MD  REFERRING DIAG: [phone] (ICD-10-CM) - Status post hip surgery / s/p arthroscopy of hip  S32.691K (ICD-10-CM) - Other specified fracture of right ischium, subsequent encounter for fracture with nonunion  THERAPY DIAG:  Pain in right hip  Muscle weakness (generalized)  Difficulty in walking, not elsewhere classified  Stiffness of right hip, not elsewhere classified  Rationale for Evaluation and Treatment: Rehabilitation  ONSET DATE: DOS  12/27/2022   SUBJECTIVE:   SUBJECTIVE STATEMENT: Anterior hip is so tight and post, deep musculature and hamstring are tight. Lt shoulder is also really tight.  PERTINENT HISTORY: Hx of hip surgeries: -Revision R hip femoroplasty, arthroscopy with labral repair, open internal fixation post pelv ring Fx on 12/27/2022 -R hip arthroscopy with femoroplasty, labral repair, and pelvis osteotomy on 08/24/2019 (PAO)  -L hip labral repair and hip bone osteotomy, femoroplasty on 03/21/20 -hardware removal on 01/05/21 in bilat hips     L ACL repair x 2 in 06 and 2016 GAD Migraines Ulcerative colitis and celiac Mast cell activation syndrome Cervical fusion in 2020 and 2021   PAIN: Are you having any pain?  Yes  NPRS: 6/10 current, 8/10 worst, 4/10 best  Location:  R anterior hip, R glute, HS, PSIS area Type:   constant  PRECAUTIONS: Other: per surgical protocol, R hip labral repair, L ACL repair x 2, cervical fusion x 2   WEIGHT BEARING RESTRICTIONS: none indicated on orders  FALLS:  Has patient fallen in last 6 months? No  LIVING ENVIRONMENT: Lives with: lives with their spouse Lives in:  2 story home but moving into a 1 story Stairs: yes   PLOF: Independent  PATIENT GOALS: to be pain-free, improve ROM, improve quality of life    OBJECTIVE:  Note: Objective measures were completed at Evaluation unless otherwise noted.  DIAGNOSTIC FINDINGS: Pelvis x ray on 04/05/23:   Findings/Impression:  Hardware: Right acetabular plate and screw fixation without complication. Alignment: No dislocation. Bones: No acute fracture. Healing osteotomy of the right superior pubic ramus root. Joints: Mild degenerative changes of the left hip. Soft Tissues: Unremarkable.    PATIENT SURVEYS:  LEFS Initial/current:  41/80 / 35/80  COGNITION: Overall cognitive status: Within functional limits for tasks assessed       LOWER EXTREMITY ROM:  Active ROM Right eval Left eval Right 4/22  Hip flexion 105 118 104  Hip extension     Hip abduction 22 30 28   Hip adduction     Hip internal rotation 42 24   Hip external rotation 24 40 32  Knee flexion     Knee extension     Ankle dorsiflexion     Ankle plantarflexion     Ankle inversion     Ankle eversion      (Blank rows = not tested)  LOWER EXTREMITY HHD:  Hip abd (seated):  R:  11.3, L:  16.9 LOWER EXTREMITY MMT:    MMT Right  Left   Hip flexion 11.6 18.6  Hip extension    Hip abduction 18.9 21.4  Hip adduction    Hip internal rotation    Hip external rotation    Knee flexion    Knee extension 23.8 32.6  Ankle dorsiflexion    Ankle plantarflexion    Ankle inversion    Ankle eversion     (Blank rows = not tested)   GAIT: Assistive device utilized: None Level of assistance: Complete Independence Comments:  Improved quality  of gait with reduced limp though still favors R LE with gait.  Appears to have a R hip drop.  Decreased hip extension.  7/10 pain with ambulation.  TREATMENT DATE:    5/30 Manual:  TPDN YES  Trigger Point Dry Needling  Subsequent Treatment: Instructions provided previously at initial dry needling treatment.   Patient Verbal Consent Given: Yes Education Handout Provided: Previously Provided Muscles Treated: Rt hip- TFL, glut med, piriformis, deep hip ERs, bil upper traps Electrical Stimulation Performed: No Treatment Response/Outcome: twitch with decreased concordant spasm  Glut set- coronal/caudal Qped Lt hip hike from Rt knee on double airex Split stance dead lift 10lb   2023/08/14 Upright bike L2-3 x 6 mins Lateral band walks with GTB above knees 3x10 Retro walking with RTB around ankles x 2 laps Monster walks with RTB x 2 laps  Step onto air-ex fwd and lateral approx 12-15  Prone plank 2x30 sec Side planks 2x20 sec bilat  Q-ped birddogs   5/21  Ex bike warm up Muscle testing   LAQ 3x10 2 lbs   Neuro-re-ed  Supine march x10  Supine march w/ switch 2x10   Decompression 90/90 Heel press 3x10  DKTC 3x10 could feel sciatic pain    Step onto air-ex fwd and lateral 2x10       PATIENT EDUCATION:  Education details:  POC, rationale of exercises and exercise volume/intensity, rationale of interventions, relevant anatomy, sx response, and exercise form.  Person educated: Patient Education method: Explanation, Demonstration, Tactile cues, Verbal cues, Education comprehension: verbalized understanding, returned demonstration, verbal cues required, tactile cues required, and needs further education  HOME EXERCISE PROGRAM: Access Code: URL: [email or web address]   3-layer heel lift PRI Rt sidelying knee over knee  (handout provided)   ASSESSMENT:  CLINICAL IMPRESSION: Limited stretch in gemelli, obturator and quadratus femoris resulting in HS spasm and anterior force of femur and tension in illiopsoas. Able to improve anterior hip pain with treatment today.     OBJECTIVE IMPAIRMENTS: Abnormal gait, decreased activity tolerance, decreased endurance, decreased mobility, difficulty walking, decreased ROM, decreased strength, hypomobility, and pain.   ACTIVITY LIMITATIONS: lifting, bending, sitting, standing, squatting, stairs, transfers, and locomotion level  PARTICIPATION LIMITATIONS: cleaning, laundry, driving, shopping, and community activity  PERSONAL FACTORS: Time since onset of injury/illness/exacerbation and 3+ comorbidities: R hip labral repair, L ACL repair x 2, cervical fusion x 2 are also affecting patient's functional outcome.   REHAB POTENTIAL: Good  CLINICAL DECISION MAKING: Evolving/moderate complexity  EVALUATION COMPLEXITY: Moderate   GOALS:  SHORT TERM GOALS: Target date:  05/28/2023   Pt will be independent with HEP for improved pain, strength, tolerance to activity, and function.  Baseline: Goal status: met for short term  2.  Pt will tolerate aquatic therapy without adverse effects in order to perform exercises in a reduced stress environment and improve functional mobility, tolerance to activity, strength, and pain.  Baseline:  Goal status: MET -05/24/23 Target date:  05/21/2023   3.  Pt's worst pain will be no > than 8/10 for improved tolerance to activity and mobility.  Baseline: 5/10 Goal status: MET - 05/24/23  4.  Pt will report at least a 25% improvement in her daily mobility.   Baseline: 20% Goal status: PARTIALLY MET - 05/24/23 and 4/22  5.  Pt will demonstrate improved quality of gait including improved Wb'ing thru R LE, reduced hip drop, and reduced limp.  Baseline:  Goal status: PARTIALLY MET  4/22  6.  Pt will perform a 6 inch step up with good form  and stability without significant pain Baseline:  Goal status: MET -05/24/23 Target date:  06/04/2023  7.  Pt will ambulate with  a normalized heel to toe gait pattern without limping.   Goal status:  PROGRESSING  4/22  Target date:  06/18/2023    LONG TERM GOALS: Target date: 08/20/2023   Pt will ambulate extended community distance without significant pain and difficulty.  Baseline:  Goal status: ONGOING  2.  Pt will ascend and descend stairs with a reciprocal gait with a rail.  Baseline:  Goal status: ONGOING  3.  Pt will be able to walk her neighborhood without significant pain. Baseline:  Goal status: In progress -06/13/23  4.  Pt will be able to perform her ADLs and IADLs including household chores without significant pain and difficulty.  Baseline:  Goal status: ONGOING  5.  Pt will squat with symmetrical Wb'ing in bilat LE's and good form without increased pain for improved functional strength.  Baseline:  Goal status: INITIAL  6.  Pt will demo 4 to 4+/5 strength in R hip abd, 4/5 in R hip flex, and 5/5 in R knee ext for improved performance of and tolerance with functional mobility.  Baseline:  Goal status: INITIAL   PLAN:  PT FREQUENCY:   2-3x/wk  PT DURATION: 9 weeks  PLANNED INTERVENTIONS: 97164- PT Re-evaluation, 97110-Therapeutic exercises, 97530- Therapeutic activity, 97112- Neuromuscular re-education, 97535- Self Care, 91478- Manual therapy, [phone]- Gait training, [phone]- Aquatic Therapy, [phone]- Electrical stimulation (unattended), Patient/Family education, Balance training, Stair training, Taping, Dry Needling, Joint mobilization, Spinal mobilization, Scar mobilization, Cryotherapy, and Moist heat  PLAN FOR NEXT SESSION:  Cont with land based therapy.  Cont with DN and exercises per protocol per pt tolerance.    Tsugio Elison C. Gaje Tennyson PT, DPT 07/26/23 3:51 PM

## 2023-07-29 ENCOUNTER — Ambulatory Visit (HOSPITAL_BASED_OUTPATIENT_CLINIC_OR_DEPARTMENT_OTHER): Attending: Orthopedic Surgery | Admitting: Physical Therapy

## 2023-07-29 ENCOUNTER — Encounter (HOSPITAL_BASED_OUTPATIENT_CLINIC_OR_DEPARTMENT_OTHER): Payer: Self-pay | Admitting: Physical Therapy

## 2023-07-29 DIAGNOSIS — M25551 Pain in right hip: Secondary | ICD-10-CM | POA: Diagnosis present

## 2023-07-29 DIAGNOSIS — M25651 Stiffness of right hip, not elsewhere classified: Secondary | ICD-10-CM | POA: Diagnosis present

## 2023-07-29 DIAGNOSIS — R262 Difficulty in walking, not elsewhere classified: Secondary | ICD-10-CM | POA: Diagnosis present

## 2023-07-29 DIAGNOSIS — M6281 Muscle weakness (generalized): Secondary | ICD-10-CM | POA: Diagnosis present

## 2023-07-29 NOTE — Therapy (Signed)
 OUTPATIENT PHYSICAL THERAPY LOWER EXTREMITY TREATMENT     Patient Name: [name] MRN: [id number] DOB:[birth year], 38 y.o., female Today's Date: 07/30/2023  END OF SESSION:  PT End of Session - 07/29/23 1458     Visit Number 29    Number of Visits 36    Date for PT Re-Evaluation 08/20/23    Authorization Type UHC    PT Start Time 1413    PT Stop Time 1456    PT Time Calculation (min) 43 min    Activity Tolerance Patient tolerated treatment well    Behavior During Therapy WFL for tasks assessed/performed                            Past Medical History:  Diagnosis Date   ACL graft tear (HCC)    left knee   Allergy     Angio-edema    Anxiety    Complication of anesthesia    woke up during surgery   Depression    Family history of adverse reaction to anesthesia     mom is difficult to put to sleep   IUD (intrauterine device) in place    Knee pain, right    Migraine headache    Sleep paralysis    Ulcerative proctitis (HCC)    Past Surgical History:  Procedure Laterality Date   ANTERIOR CRUCIATE LIGAMENT REPAIR Left 08/10/2014   Procedure: ANTERIOR CRUCIATE LIGAMENT (ACL) REPAIR;  Surgeon: Genevie Kerns, MD;  Location: [facility];  Service: Orthopedics;  Laterality: Left;   HIP SURGERY Right 08/25/2019   PAO surgery [facility]   HIP SURGERY Bilateral 01/05/2021   [facility], had hardware taken out   KNEE ARTHROSCOPY Left 08/10/2014   Procedure: ARTHROSCOPY KNEE DEBRIDEMENT ALLOGRAFT ACL REVISION RECONSTRUCTION;  Surgeon: Genevie Kerns, MD;  Location: [facility];  Service: Orthopedics;  Laterality: Left;   KNEE ARTHROSCOPY W/ ACL RECONSTRUCTION Left 2006   SPINAL FUSION     widom tooth extraction     only had 1 wisdom tooth    Patient Active Problem List   Diagnosis Date Noted   Ulcerative proctitis with rectal bleeding (HCC) 06/16/2020   Bloating 06/16/2020   Chronic migraine without aura without status  migrainosus, not intractable 08/19/2018   Migraine with aura and with status migrainosus, not intractable 08/19/2018   Pain in left shoulder 08/19/2018   Pain in left ankle and joints of left foot 05/09/2018   Pain in left hip 05/09/2018   Cervicalgia 05/09/2018   Chronic rhinitis 11/12/2017   Angioedema 11/12/2017   Allergic reaction 11/12/2017   Chronic sinusitis 11/12/2017   Cervical intraepithelial neoplasia grade 1 10/01/2017   GAD (generalized anxiety disorder) 09/17/2014   ACL graft tear (HCC) 08/10/2014   S/P ACL reconstruction 08/10/2014   Migraine headache 05/15/2010     REFERRING PROVIDER: Allan Ishihara, MD  REFERRING DIAG: [phone] (ICD-10-CM) - Status post hip surgery / s/p arthroscopy of hip  S32.691K (ICD-10-CM) - Other specified fracture of right ischium, subsequent encounter for fracture with nonunion  THERAPY DIAG:  Pain in right hip  Muscle weakness (generalized)  Difficulty in walking, not elsewhere classified  Stiffness of right hip, not elsewhere classified  Rationale for Evaluation and Treatment: Rehabilitation  ONSET DATE: DOS  12/27/2022   SUBJECTIVE:   SUBJECTIVE STATEMENT: Pt is 7 months post op.  Pt states her piriformis area is bothering her the most right now.  Pt states  the dry needling was great prior Rx.  Pt feels that dry needling activated her piriformis.  It feels like her muscle woke up though is hurting.        PERTINENT HISTORY: Hx of hip surgeries: -Revision R hip femoroplasty, arthroscopy with labral repair, open internal fixation post pelv ring Fx on 12/27/2022 -R hip arthroscopy with femoroplasty, labral repair, and pelvis osteotomy on 08/24/2019 (PAO)  -L hip labral repair and hip bone osteotomy, femoroplasty on 03/21/20 -hardware removal on 01/05/21 in bilat hips     L ACL repair x 2 in 06 and 2016 GAD Migraines Ulcerative colitis and celiac Mast cell activation syndrome Cervical fusion in 2020 and 2021   PAIN:  Are you having any pain?  Yes  NPRS: 6/10 current, 8/10 worst, 4/10 best  Location:  R anterior hip, R glute/piriformis, HS, PSIS area Type:  constant  PRECAUTIONS: Other: per surgical protocol, R hip labral repair, L ACL repair x 2, cervical fusion x 2   WEIGHT BEARING RESTRICTIONS: none indicated on orders  FALLS:  Has patient fallen in last 6 months? No  LIVING ENVIRONMENT: Lives with: lives with their spouse Lives in:  2 story home but moving into a 1 story Stairs: yes   PLOF: Independent  PATIENT GOALS: to be pain-free, improve ROM, improve quality of life    OBJECTIVE:  Note: Objective measures were completed at Evaluation unless otherwise noted.  DIAGNOSTIC FINDINGS: Pelvis x ray on 04/05/23:   Findings/Impression:  Hardware: Right acetabular plate and screw fixation without complication. Alignment: No dislocation. Bones: No acute fracture. Healing osteotomy of the right superior pubic ramus root. Joints: Mild degenerative changes of the left hip. Soft Tissues: Unremarkable.    PATIENT SURVEYS:  LEFS Initial/current:  41/80 / 35/80  COGNITION: Overall cognitive status: Within functional limits for tasks assessed       LOWER EXTREMITY ROM:  Active ROM Right eval Left eval Right 4/22  Hip flexion 105 118 104  Hip extension     Hip abduction 22 30 28   Hip adduction     Hip internal rotation 42 24   Hip external rotation 24 40 32  Knee flexion     Knee extension     Ankle dorsiflexion     Ankle plantarflexion     Ankle inversion     Ankle eversion      (Blank rows = not tested)  LOWER EXTREMITY HHD:  Hip abd (seated):  R:  11.3, L:  16.9 LOWER EXTREMITY MMT:    MMT Right  Left   Hip flexion 11.6 18.6  Hip extension    Hip abduction 18.9 21.4  Hip adduction    Hip internal rotation    Hip external rotation    Knee flexion    Knee extension 23.8 32.6  Ankle dorsiflexion    Ankle plantarflexion    Ankle inversion    Ankle eversion      (Blank rows = not tested)   GAIT: Assistive device utilized: None Level of assistance: Complete Independence Comments:  Improved quality of gait with reduced limp though still favors R LE with gait.  Appears to have a R hip drop.  Decreased hip extension.  7/10 pain with ambulation.  TREATMENT DATE:   6/2 Elliptical lvl 0 x 5 mins Lateral band walks with GTB around ankles 3x10 Retro Walks/Monster Walks with RTB 3x10 Squats with 5# 2x12  Step onto air-ex fwd and lateral approx 2x10  Prone planks 3x30 sec Side planks 2 x 30 sec  5/30 Manual:  TPDN YES  Trigger Point Dry Needling  Subsequent Treatment: Instructions provided previously at initial dry needling treatment.   Patient Verbal Consent Given: Yes Education Handout Provided: Previously Provided Muscles Treated: Rt hip- TFL, glut med, piriformis, deep hip ERs, bil upper traps Electrical Stimulation Performed: No Treatment Response/Outcome: twitch with decreased concordant spasm  Glut set- coronal/caudal Qped Lt hip hike from Rt knee on double airex Split stance dead lift 10lb   08/08/23 Upright bike L2-3 x 6 mins Lateral band walks with GTB above knees 3x10 Retro walking with RTB around ankles x 2 laps Monster walks with RTB x 2 laps  Step onto air-ex fwd and lateral approx 12-15  Prone plank 2x30 sec Side planks 2x20 sec bilat  Q-ped birddogs   5/21  Ex bike warm up Muscle testing   LAQ 3x10 2 lbs   Neuro-re-ed  Supine march x10  Supine march w/ switch 2x10   Decompression 90/90 Heel press 3x10  DKTC 3x10 could feel sciatic pain    Step onto air-ex fwd and lateral 2x10       PATIENT EDUCATION:  Education details:  POC, rationale of exercises and exercise volume/intensity, rationale of interventions, relevant anatomy, sx response, and exercise form.  Person educated:  Patient Education method: Explanation, Demonstration, Tactile cues, Verbal cues, Education comprehension: verbalized understanding, returned demonstration, verbal cues required, tactile cues required, and needs further education  HOME EXERCISE PROGRAM: Access Code: URL: [email or web address]   3-layer heel lift PRI Rt sidelying knee over knee (handout provided)   ASSESSMENT:  CLINICAL IMPRESSION: Pt is improving with strength and tolerance with exercises as evidenced by performance of exercises.  PT progressed exercises with adding elliptical, adding weight to squats, increasing time with side planks, and moving theraband distally with lateral band walks.  She performed exercises well having good tolerance with exercises.  PT responded well to Rx having no c/o's after Rx.       OBJECTIVE IMPAIRMENTS: Abnormal gait, decreased activity tolerance, decreased endurance, decreased mobility, difficulty walking, decreased ROM, decreased strength, hypomobility, and pain.   ACTIVITY LIMITATIONS: lifting, bending, sitting, standing, squatting, stairs, transfers, and locomotion level  PARTICIPATION LIMITATIONS: cleaning, laundry, driving, shopping, and community activity  PERSONAL FACTORS: Time since onset of injury/illness/exacerbation and 3+ comorbidities: R hip labral repair, L ACL repair x 2, cervical fusion x 2 are also affecting patient's functional outcome.   REHAB POTENTIAL: Good  CLINICAL DECISION MAKING: Evolving/moderate complexity  EVALUATION COMPLEXITY: Moderate   GOALS:  SHORT TERM GOALS: Target date:  05/28/2023   Pt will be independent with HEP for improved pain, strength, tolerance to activity, and function.  Baseline: Goal status: met for short term  2.  Pt will tolerate aquatic therapy without adverse effects in order to perform exercises in a reduced stress environment and improve functional mobility, tolerance to activity, strength, and pain.   Baseline:  Goal status: MET -05/24/23 Target date:  05/21/2023   3.  Pt's worst pain will be no > than 8/10 for improved tolerance to activity and mobility.  Baseline: 5/10 Goal status: MET - 05/24/23  4.  Pt will report at least a 25% improvement in her daily mobility.  Baseline: 20% Goal status: PARTIALLY MET - 05/24/23 and 4/22  5.  Pt will demonstrate improved quality of gait including improved Wb'ing thru R LE, reduced hip drop, and reduced limp.  Baseline:  Goal status: PARTIALLY MET  4/22  6.  Pt will perform a 6 inch step up with good form and stability without significant pain Baseline:  Goal status: MET -05/24/23 Target date:  06/04/2023  7.  Pt will ambulate with a normalized heel to toe gait pattern without limping.   Goal status:  PROGRESSING  4/22  Target date:  06/18/2023    LONG TERM GOALS: Target date: 08/20/2023   Pt will ambulate extended community distance without significant pain and difficulty.  Baseline:  Goal status: ONGOING  2.  Pt will ascend and descend stairs with a reciprocal gait with a rail.  Baseline:  Goal status: ONGOING  3.  Pt will be able to walk her neighborhood without significant pain. Baseline:  Goal status: In progress -06/13/23  4.  Pt will be able to perform her ADLs and IADLs including household chores without significant pain and difficulty.  Baseline:  Goal status: ONGOING  5.  Pt will squat with symmetrical Wb'ing in bilat LE's and good form without increased pain for improved functional strength.  Baseline:  Goal status: INITIAL  6.  Pt will demo 4 to 4+/5 strength in R hip abd, 4/5 in R hip flex, and 5/5 in R knee ext for improved performance of and tolerance with functional mobility.  Baseline:  Goal status: INITIAL   PLAN:  PT FREQUENCY:   2-3x/wk  PT DURATION: 9 weeks  PLANNED INTERVENTIONS: 97164- PT Re-evaluation, 97110-Therapeutic exercises, 97530- Therapeutic activity, 97112- Neuromuscular re-education,  97535- Self Care, 40981- Manual therapy, [phone]- Gait training, [phone]- Aquatic Therapy, [phone]- Electrical stimulation (unattended), Patient/Family education, Balance training, Stair training, Taping, Dry Needling, Joint mobilization, Spinal mobilization, Scar mobilization, Cryotherapy, and Moist heat  PLAN FOR NEXT SESSION:  Cont with land based therapy.  Cont with DN and exercises per protocol per pt tolerance.    Trina Fujita III PT, DPT 07/30/23 9:23 PM

## 2023-07-31 ENCOUNTER — Ambulatory Visit (HOSPITAL_BASED_OUTPATIENT_CLINIC_OR_DEPARTMENT_OTHER): Admitting: Physical Therapy

## 2023-07-31 ENCOUNTER — Other Ambulatory Visit (HOSPITAL_COMMUNITY): Payer: Self-pay | Admitting: *Deleted

## 2023-07-31 ENCOUNTER — Telehealth (HOSPITAL_BASED_OUTPATIENT_CLINIC_OR_DEPARTMENT_OTHER): Payer: Self-pay | Admitting: Psychiatry

## 2023-07-31 ENCOUNTER — Encounter (HOSPITAL_COMMUNITY): Payer: Self-pay | Admitting: Psychiatry

## 2023-07-31 VITALS — Wt 136.0 lb

## 2023-07-31 DIAGNOSIS — F4001 Agoraphobia with panic disorder: Secondary | ICD-10-CM | POA: Diagnosis not present

## 2023-07-31 DIAGNOSIS — M25651 Stiffness of right hip, not elsewhere classified: Secondary | ICD-10-CM

## 2023-07-31 DIAGNOSIS — Z62819 Personal history of unspecified abuse in childhood: Secondary | ICD-10-CM

## 2023-07-31 DIAGNOSIS — F411 Generalized anxiety disorder: Secondary | ICD-10-CM | POA: Diagnosis not present

## 2023-07-31 DIAGNOSIS — F330 Major depressive disorder, recurrent, mild: Secondary | ICD-10-CM

## 2023-07-31 DIAGNOSIS — F4321 Adjustment disorder with depressed mood: Secondary | ICD-10-CM

## 2023-07-31 DIAGNOSIS — M25551 Pain in right hip: Secondary | ICD-10-CM | POA: Diagnosis not present

## 2023-07-31 DIAGNOSIS — R262 Difficulty in walking, not elsewhere classified: Secondary | ICD-10-CM

## 2023-07-31 DIAGNOSIS — M6281 Muscle weakness (generalized): Secondary | ICD-10-CM

## 2023-07-31 MED ORDER — AMITRIPTYLINE HCL 10 MG PO TABS: 10.0000 mg | ORAL_TABLET | Freq: Every day | ORAL | 1 refills | Status: DC

## 2023-07-31 NOTE — Progress Notes (Signed)
 [facility] MD Virtual Progress Note   Patient Location: Home Provider Location: Home Office  I connect with patient by video and verified that I am speaking with correct person by using two identifiers. I discussed the limitations of evaluation and management by telemedicine and the availability of in person appointments. I also discussed with the patient that there may be a patient responsible charge related to this service. The patient expressed understanding and agreed to proceed.  [name] [id number] 38 y.o.  07/31/2023 8:59 AM  History of Present Illness:  [name] is 38 year old Caucasian, married, self-employed female who was seen first time few weeks ago as referred from GI doctor following management of anxiety symptoms.  She also had panic attacks, depression and history of trauma.  Patient has multiple health issues and she is seeing multiple provider.  She has chronic pain and had multiple surgeries.  She is taking Cymbalta  to help her anxiety and pain.  Patient told recently her uncle died due to overdose.  Patient told uncle had addiction with pain meds and it is unclear if it was intentional or accidental overdose.  Patient told her grandma died 3 months ago and not sure if the uncle was going through grief.  Patient reported feeling sad about the her uncle's death.  She started on amitriptyline  only 10 mg.  She is not sure if it is working or not working because her symptoms remain the same.  She reported her dreams are more vivid.  She also reported having sleep paralysis but she was never given the diagnosis and never had any sleep study.  Patient told she has the symptoms since she was 38 years old.  She recalls episodes usually at night when she go to sleep and she feel her heart is not working.  She denies any major panic attacks since the last visit.  She has few crying spells but denies any suicidal thoughts or homicidal thoughts.  Patient lives with her husband.   She has no children.  She is independent Producer, television/film/video.  Husband works for [employer].  She will reported her appetite and weight is unchanged.  She denies any tremors, shakes or any EPS.  We also referred her for therapy for chronic PTSD but patient did not able to connect with a therapist.  Patient has a limited contact with her parents due to past trauma.  Her mother trying to improve the relationship with the patient.  Patient is on Cymbalta  30 mg and reported no major side effects.  She do not recall any major issues with the amitriptyline  10 mg.  Past Psychiatric History: History of anxiety and depression in the past due to financial stress and not have a job.  Took Cymbalta  up to 60 mg and Wellbutrin  but stopped after feeling better.  Lexapro  did not work, gabapentin caused visual impairment.  No history of suicidal attempt, mania, psychosis.  History of childhood trauma.  History of cannabis use but stopped after panic attack.   Outpatient Encounter Medications as of 07/31/2023  Medication Sig   amitriptyline  (ELAVIL ) 10 MG tablet Take 1-2 tablets (10-20 mg total) by mouth at bedtime.   clonazePAM  (KLONOPIN ) 0.5 MG tablet Take 1 tablet (0.5 mg total) by mouth daily as needed for anxiety. TAKE 1 TABLET(0.5 MG) BY MOUTH DAILY AS NEEDED FOR ANXIETY   cyclobenzaprine  (FLEXERIL ) 10 MG tablet Take 1 tablet (10 mg total) by mouth 2 (two) times daily as needed for muscle spasms. TAKE 1  TABLET(10 MG) BY MOUTH AT BEDTIME   dicyclomine  (BENTYL ) 10 MG capsule Take 1 capsule (10 mg total) by mouth every 6 (six) hours.   DULoxetine  (CYMBALTA ) 30 MG capsule Take 1 capsule (30 mg total) by mouth daily.   mesalamine  (LIALDA ) 1.2 g EC tablet TAKE 4 TABLETS(4.8 GRAMS) BY MOUTH DAILY WITH BREAKFAST   mesalamine  (ROWASA ) 4 g enema Place 60 mLs (4 g total) rectally 2 (two) times daily.   pantoprazole  (PROTONIX ) 40 MG tablet Take 1 tablet (40 mg total) by mouth 2 (two) times daily for 42 days, THEN 1 tablet  (40 mg total) daily. Use Protonix  40 mg PO BID for 6 weeks then reduce to 40mg  daily.Aaron Aas   sulfaSALAzine (AZULFIDINE) 500 MG tablet TAKE 1 TABLET BY MOUTH THREE TIMES DAILY BEFORE MEALS (Patient not taking: Reported on 05/02/2023)   SUMAtriptan  (IMITREX ) 100 MG tablet TAKE 1 TABLET IMMEDIATELY. MAY REPEAT IN 2 HRS AS NEEDED FOR MIGRAINE   No facility-administered encounter medications on file as of 07/31/2023.    Recent Results (from the past 2160 hours)  Calprotectin, Fecal     Status: None   Collection Time: 05/02/23 10:43 AM   Specimen: Blood   BLD  Result Value Ref Range   Calprotectin, Fecal 23 0 - 120 ug/g    Comment: Concentration     Interpretation   Follow-Up < 5 - 50 ug/g     Normal           None >50 -120 ug/g     Borderline       Re-evaluate in 4-6 weeks     >120 ug/g     Abnormal         Repeat as clinically                                    indicated   Vitamin B6     Status: None   Collection Time: 05/02/23 10:43 AM  Result Value Ref Range   Vitamin B6 12.0 2.1 - 21.7 ng/mL    Comment: (Note) Vitamin supplementation within 24 hours prior to blood  draw may affect the accuracy of results. . This test was developed and its analytical performance  characteristics have been determined by [employer]. It has not been cleared or approved by the  FDA. This assay has been validated pursuant to the CLIA  regulations and is used for clinical purposes. . [facility] [address] [place] [address] [phone] Debroah Fanning L. Amaryllis Junior, MD, PhD   Vitamin D  (25 hydroxy)     Status: None   Collection Time: 05/02/23 10:43 AM  Result Value Ref Range   VITD 35.72 30.00 - 100.00 ng/mL  IBC + Ferritin     Status: None   Collection Time: 05/02/23 10:43 AM  Result Value Ref Range   Iron 119 42 - 145 ug/dL   Transferrin 416.6 063.0 - 360.0 mg/dL   Saturation Ratios 16.0 20.0 - 50.0 %   Ferritin 21.3 10.0 - 291.0 ng/mL   TIBC 351.4 250.0 -  450.0 mcg/dL  C-reactive protein     Status: None   Collection Time: 05/02/23 10:43 AM  Result Value Ref Range   CRP <1.0 0.5 - 20.0 mg/dL  Sed Rate (ESR)     Status: None   Collection Time: 05/02/23 10:43 AM  Result Value Ref Range   Sed Rate  9 0 - 20 mm/hr     Psychiatric Specialty Exam: Physical Exam  Review of Systems  Musculoskeletal:  Positive for back pain.  Neurological:  Positive for numbness.  Psychiatric/Behavioral:  Positive for dysphoric mood and sleep disturbance.     Weight 136 lb (61.7 kg).Body mass index is 24.09 kg/m.  General Appearance: Well Groomed  Eye Contact:  Good  Speech:  Slow  Volume:  Normal  Mood:  Dysphoric  Affect:  Appropriate  Thought Process:  Goal Directed  Orientation:  Full (Time, Place, and Person)  Thought Content:  Rumination  Suicidal Thoughts:  No  Homicidal Thoughts:  No  Memory:  Immediate;   Good Recent;   Good Remote;   Good  Judgement:  Intact  Insight:  Present  Psychomotor Activity:  Decreased  Concentration:  Concentration: Good and Attention Span: Good  Recall:  Good  Fund of Knowledge:  Good  Language:  Good  Akathisia:  No  Handed:  Right  AIMS (if indicated):     Assets:  Communication Skills Desire for Improvement Financial Resources/Insurance Housing Social Support Talents/Skills Transportation  ADL's:  Intact  Cognition:  WNL  Sleep:  fair but vivid dreams and sleep paralysis       01/20/2020    2:47 PM 08/19/2019    1:39 PM 07/17/2018    4:05 PM 06/05/2018    3:40 PM 05/05/2018    9:01 AM  Depression screen PHQ 2/9  Decreased Interest 3 0 0 3 1  Down, Depressed, Hopeless 3 0 0 3 2  PHQ - 2 Score 6 0 0 6 3  Altered sleeping 3   3 3   Tired, decreased energy 3   3 3   Change in appetite 0   0 1  Feeling bad or failure about yourself  3   0 1  Trouble concentrating 3   1 2   Moving slowly or fidgety/restless 0   0 0  Suicidal thoughts 0   0 0  PHQ-9 Score 18   13 13   Difficult doing work/chores  Very difficult   Very difficult Extremely dIfficult    Assessment/Plan: MDD (major depressive disorder), recurrent episode, mild (HCC) - Plan: amitriptyline  (ELAVIL ) 10 MG tablet  Agoraphobia with panic attacks - Plan: amitriptyline  (ELAVIL ) 10 MG tablet  Trauma in childhood - Plan: amitriptyline  (ELAVIL ) 10 MG tablet  GAD (generalized anxiety disorder) - Plan: amitriptyline  (ELAVIL ) 10 MG tablet  Grief  Patient is 38 year old Caucasian, self-employed married female with multiple health issues including ulcerative colitis, migraine headaches, chronic pain with neuropathy, back pain and GI issues.  Review current medication.  Patient is not physical therapy.  Patient is only taking 10 mg amitriptyline .  She is on Cymbalta  30 mg daily.  Discussed recent loss of uncle's death due to overdose.  She cannot is going through grief.  I encouraged to contact therapist for grief counseling and consider EMDR.  Recommend to try amitriptyline  20 mg to help her anxiety symptoms.  She has no panic attacks since the last visit.  Her Cymbalta  was prescribed by her PCP.  Patient is not sure if she has refills remaining on the Cymbalta  and she will call us  back if she need more additional refills on Cymbalta .  I recommend should give more time to amitriptyline .  She agreed with the plan.  Will follow-up in 6 to 8 weeks.   Follow Up Instructions:     I discussed the assessment and treatment plan with the patient. The  patient was provided an opportunity to ask questions and all were answered. The patient agreed with the plan and demonstrated an understanding of the instructions.   The patient was advised to call back or seek an in-person evaluation if the symptoms worsen or if the condition fails to improve as anticipated.    Collaboration of Care: Other provider involved in patient's care AEB notes are available in epic to review  Patient/Guardian was advised Release of Information must be obtained prior to  any record release in order to collaborate their care with an outside provider. Patient/Guardian was advised if they have not already done so to contact the registration department to sign all necessary forms in order for us  to release information regarding their care.   Consent: Patient/Guardian gives verbal consent for treatment and assignment of benefits for services provided during this visit. Patient/Guardian expressed understanding and agreed to proceed.     Total encounter time 28 minutes which includes face-to-face time, chart reviewed, care coordination, order entry and documentation during this encounter.   Note: This document was prepared by [employer] voice dictation technology and any errors that results from this process are unintentional.    Arturo Late, MD 07/31/2023

## 2023-08-01 ENCOUNTER — Ambulatory Visit (HOSPITAL_BASED_OUTPATIENT_CLINIC_OR_DEPARTMENT_OTHER): Admitting: Physical Therapy

## 2023-08-01 ENCOUNTER — Encounter (HOSPITAL_BASED_OUTPATIENT_CLINIC_OR_DEPARTMENT_OTHER): Payer: Self-pay | Admitting: Physical Therapy

## 2023-08-01 NOTE — Therapy (Signed)
 OUTPATIENT PHYSICAL THERAPY LOWER EXTREMITY TREATMENT     Patient Name: [name] MRN: [id number] DOB:03/11/1985, 38 y.o., female Today's Date: 08/01/2023  END OF SESSION:  PT End of Session - 08/01/23 1135     Visit Number 30    Number of Visits 36    Date for PT Re-Evaluation 08/20/23    Authorization Type UHC    PT Start Time 1430    PT Stop Time 1512    PT Time Calculation (min) 42 min    Activity Tolerance Patient tolerated treatment well    Behavior During Therapy WFL for tasks assessed/performed                            Past Medical History:  Diagnosis Date   ACL graft tear (HCC)    left knee   Allergy     Angio-edema    Anxiety    Complication of anesthesia    woke up during surgery   Depression    Family history of adverse reaction to anesthesia     mom is difficult to put to sleep   IUD (intrauterine device) in place    Knee pain, right    Migraine headache    Sleep paralysis    Ulcerative proctitis (HCC)    Past Surgical History:  Procedure Laterality Date   ANTERIOR CRUCIATE LIGAMENT REPAIR Left 08/10/2014   Procedure: ANTERIOR CRUCIATE LIGAMENT (ACL) REPAIR;  Surgeon: Genevie Kerns, MD;  Location: [facility];  Service: Orthopedics;  Laterality: Left;   HIP SURGERY Right 08/25/2019   PAO surgery [facility] Bray   HIP SURGERY Bilateral 01/05/2021   [facility], had hardware taken out   KNEE ARTHROSCOPY Left 08/10/2014   Procedure: ARTHROSCOPY KNEE DEBRIDEMENT ALLOGRAFT ACL REVISION RECONSTRUCTION;  Surgeon: Genevie Kerns, MD;  Location: [facility];  Service: Orthopedics;  Laterality: Left;   KNEE ARTHROSCOPY W/ ACL RECONSTRUCTION Left 2006   SPINAL FUSION     widom tooth extraction     only had 1 wisdom tooth    Patient Active Problem List   Diagnosis Date Noted   Ulcerative proctitis with rectal bleeding (HCC) 06/16/2020   Bloating 06/16/2020   Chronic migraine without aura without status  migrainosus, not intractable 08/19/2018   Migraine with aura and with status migrainosus, not intractable 08/19/2018   Pain in left shoulder 08/19/2018   Pain in left ankle and joints of left foot 05/09/2018   Pain in left hip 05/09/2018   Cervicalgia 05/09/2018   Chronic rhinitis 11/12/2017   Angioedema 11/12/2017   Allergic reaction 11/12/2017   Chronic sinusitis 11/12/2017   Cervical intraepithelial neoplasia grade 1 10/01/2017   GAD (generalized anxiety disorder) 09/17/2014   ACL graft tear (HCC) 08/10/2014   S/P ACL reconstruction 08/10/2014   Migraine headache 05/15/2010     REFERRING PROVIDER: Allan Ishihara, MD  REFERRING DIAG: [phone] (ICD-10-CM) - Status post hip surgery / s/p arthroscopy of hip  S32.691K (ICD-10-CM) - Other specified fracture of right ischium, subsequent encounter for fracture with nonunion  THERAPY DIAG:  Pain in right hip  Muscle weakness (generalized)  Difficulty in walking, not elsewhere classified  Stiffness of right hip, not elsewhere classified  Rationale for Evaluation and Treatment: Rehabilitation  ONSET DATE: DOS  12/27/2022   SUBJECTIVE:   SUBJECTIVE STATEMENT: Pt is 7 months post op.  Pt states her piriformis area is bothering her the most right now.  Pt states  the dry needling was great prior Rx.  Pt feels that dry needling activated her piriformis.  It feels like her muscle woke up though is hurting.        PERTINENT HISTORY: Hx of hip surgeries: -Revision R hip femoroplasty, arthroscopy with labral repair, open internal fixation post pelv ring Fx on 12/27/2022 -R hip arthroscopy with femoroplasty, labral repair, and pelvis osteotomy on 08/24/2019 (PAO)  -L hip labral repair and hip bone osteotomy, femoroplasty on 03/21/20 -hardware removal on 01/05/21 in bilat hips     L ACL repair x 2 in 06 and 2016 GAD Migraines Ulcerative colitis and celiac Mast cell activation syndrome Cervical fusion in 2020 and 2021   PAIN:  Are you having any pain?  Yes  NPRS: 6/10 current, 8/10 worst, 4/10 best  Location:  R anterior hip, R glute/piriformis, HS, PSIS area Type:  constant  PRECAUTIONS: Other: per surgical protocol, R hip labral repair, L ACL repair x 2, cervical fusion x 2   WEIGHT BEARING RESTRICTIONS: none indicated on orders  FALLS:  Has patient fallen in last 6 months? No  LIVING ENVIRONMENT: Lives with: lives with their spouse Lives in:  2 story home but moving into a 1 story Stairs: yes   PLOF: Independent  PATIENT GOALS: to be pain-free, improve ROM, improve quality of life    OBJECTIVE:  Note: Objective measures were completed at Evaluation unless otherwise noted.  DIAGNOSTIC FINDINGS: Pelvis x ray on 04/05/23:   Findings/Impression:  Hardware: Right acetabular plate and screw fixation without complication. Alignment: No dislocation. Bones: No acute fracture. Healing osteotomy of the right superior pubic ramus root. Joints: Mild degenerative changes of the left hip. Soft Tissues: Unremarkable.    PATIENT SURVEYS:  LEFS Initial/current:  41/80 / 35/80  COGNITION: Overall cognitive status: Within functional limits for tasks assessed       LOWER EXTREMITY ROM:  Active ROM Right eval Left eval Right 4/22  Hip flexion 105 118 104  Hip extension     Hip abduction 22 30 28   Hip adduction     Hip internal rotation 42 24   Hip external rotation 24 40 32  Knee flexion     Knee extension     Ankle dorsiflexion     Ankle plantarflexion     Ankle inversion     Ankle eversion      (Blank rows = not tested)  LOWER EXTREMITY HHD:  Hip abd (seated):  R:  11.3, L:  16.9 LOWER EXTREMITY MMT:    MMT Right  Left   Hip flexion 11.6 18.6  Hip extension    Hip abduction 18.9 21.4  Hip adduction    Hip internal rotation    Hip external rotation    Knee flexion    Knee extension 23.8 32.6  Ankle dorsiflexion    Ankle plantarflexion    Ankle inversion    Ankle eversion      (Blank rows = not tested)   GAIT: Assistive device utilized: None Level of assistance: Complete Independence Comments:  Improved quality of gait with reduced limp though still favors R LE with gait.  Appears to have a R hip drop.  Decreased hip extension.  7/10 pain with ambulation.  TREATMENT DATE:    6/5 Manual: Trigger point release to lower lumbar spine, gluteals, and IT band. LAD with grade 2 and 3 oscillations to right lower extremity  Neuro reeducation: Dead lift from 4 inch lift 26 pounds 3 x 12 RPE of 6 Deep squat 3 x 10 using TRX for control   6/2 Elliptical lvl 0 x 5 mins Lateral band walks with GTB around ankles 3x10 Retro Walks/Monster Walks with RTB 3x10 Squats with 5# 2x12  Step onto air-ex fwd and lateral approx 2x10  Prone planks 3x30 sec Side planks 2 x 30 sec  5/30 Manual:  TPDN YES  Trigger Point Dry Needling  Subsequent Treatment: Instructions provided previously at initial dry needling treatment.   Patient Verbal Consent Given: Yes Education Handout Provided: Previously Provided Muscles Treated: Rt hip- TFL, glut med, piriformis, deep hip ERs, bil upper traps Electrical Stimulation Performed: No Treatment Response/Outcome: twitch with decreased concordant spasm  Glut set- coronal/caudal Qped Lt hip hike from Rt knee on double airex Split stance dead lift 10lb   August 01, 2023 Upright bike L2-3 x 6 mins Lateral band walks with GTB above knees 3x10 Retro walking with RTB around ankles x 2 laps Monster walks with RTB x 2 laps  Step onto air-ex fwd and lateral approx 12-15  Prone plank 2x30 sec Side planks 2x20 sec bilat  Q-ped birddogs   5/21  Ex bike warm up Muscle testing   LAQ 3x10 2 lbs   Neuro-re-ed  Supine march x10  Supine march w/ switch 2x10   Decompression 90/90 Heel press 3x10  DKTC 3x10 could  feel sciatic pain    Step onto air-ex fwd and lateral 2x10       PATIENT EDUCATION:  Education details:  POC, rationale of exercises and exercise volume/intensity, rationale of interventions, relevant anatomy, sx response, and exercise form.  Person educated: Patient Education method: Explanation, Demonstration, Tactile cues, Verbal cues, Education comprehension: verbalized understanding, returned demonstration, verbal cues required, tactile cues required, and needs further education  HOME EXERCISE PROGRAM: Access Code: URL: [email or web address]   3-layer heel lift PRI Rt sidelying knee over knee (handout provided)   ASSESSMENT:  CLINICAL IMPRESSION: Patient with trigger points.  Therapy will work to include heels more bar spine.  We increased the weight with her dead lift.  We also worked on deep squats using TRX straps.  She has kettle bells at home.  She is advised to continue to progress her strengthening.  She reported improved pain following manual therapy.  Therapy will continue to progress as tolerated.   OBJECTIVE IMPAIRMENTS: Abnormal gait, decreased activity tolerance, decreased endurance, decreased mobility, difficulty walking, decreased ROM, decreased strength, hypomobility, and pain.   ACTIVITY LIMITATIONS: lifting, bending, sitting, standing, squatting, stairs, transfers, and locomotion level  PARTICIPATION LIMITATIONS: cleaning, laundry, driving, shopping, and community activity  PERSONAL FACTORS: Time since onset of injury/illness/exacerbation and 3+ comorbidities: R hip labral repair, L ACL repair x 2, cervical fusion x 2 are also affecting patient's functional outcome.   REHAB POTENTIAL: Good  CLINICAL DECISION MAKING: Evolving/moderate complexity  EVALUATION COMPLEXITY: Moderate   GOALS:  SHORT TERM GOALS: Target date:  05/28/2023   Pt will be independent with HEP for improved pain, strength, tolerance to activity, and  function.  Baseline: Goal status: met for short term  2.  Pt will tolerate aquatic therapy without adverse effects in order to perform exercises in a reduced stress environment and improve functional mobility, tolerance to activity, strength, and pain.  Baseline:  Goal status: MET -05/24/23 Target date:  05/21/2023   3.  Pt's worst pain will be no > than 8/10 for improved tolerance to activity and mobility.  Baseline: 5/10 Goal status: MET - 05/24/23  4.  Pt will report at least a 25% improvement in her daily mobility.   Baseline: 20% Goal status: PARTIALLY MET - 05/24/23 and 4/22  5.  Pt will demonstrate improved quality of gait including improved Wb'ing thru R LE, reduced hip drop, and reduced limp.  Baseline:  Goal status: PARTIALLY MET  4/22  6.  Pt will perform a 6 inch step up with good form and stability without significant pain Baseline:  Goal status: MET -05/24/23 Target date:  06/04/2023  7.  Pt will ambulate with a normalized heel to toe gait pattern without limping.   Goal status:  PROGRESSING  4/22  Target date:  06/18/2023    LONG TERM GOALS: Target date: 08/20/2023   Pt will ambulate extended community distance without significant pain and difficulty.  Baseline:  Goal status: ONGOING  2.  Pt will ascend and descend stairs with a reciprocal gait with a rail.  Baseline:  Goal status: ONGOING  3.  Pt will be able to walk her neighborhood without significant pain. Baseline:  Goal status: In progress -06/13/23  4.  Pt will be able to perform her ADLs and IADLs including household chores without significant pain and difficulty.  Baseline:  Goal status: ONGOING  5.  Pt will squat with symmetrical Wb'ing in bilat LE's and good form without increased pain for improved functional strength.  Baseline:  Goal status: INITIAL  6.  Pt will demo 4 to 4+/5 strength in R hip abd, 4/5 in R hip flex, and 5/5 in R knee ext for improved performance of and tolerance with  functional mobility.  Baseline:  Goal status: INITIAL   PLAN:  PT FREQUENCY:   2-3x/wk  PT DURATION: 9 weeks  PLANNED INTERVENTIONS: 97164- PT Re-evaluation, 97110-Therapeutic exercises, 97530- Therapeutic activity, 97112- Neuromuscular re-education, 97535- Self Care, 16109- Manual therapy, [phone]- Gait training, [phone]- Aquatic Therapy, [phone]- Electrical stimulation (unattended), Patient/Family education, Balance training, Stair training, Taping, Dry Needling, Joint mobilization, Spinal mobilization, Scar mobilization, Cryotherapy, and Moist heat  PLAN FOR NEXT SESSION:  Cont with land based therapy.  Cont with DN and exercises per protocol per pt tolerance.    Signa Drier  08/01/23 8:25 PM

## 2023-08-04 NOTE — Therapy (Incomplete)
 OUTPATIENT PHYSICAL THERAPY LOWER EXTREMITY TREATMENT     Patient Name: [name] MRN: [id number] DOB:1986/01/19, 38 y.o., female Today's Date: 08/04/2023  END OF SESSION:                   Past Medical History:  Diagnosis Date   ACL graft tear (HCC)    left knee   Allergy     Angio-edema    Anxiety    Complication of anesthesia    woke up during surgery   Depression    Family history of adverse reaction to anesthesia     mom is difficult to put to sleep   IUD (intrauterine device) in place    Knee pain, right    Migraine headache    Sleep paralysis    Ulcerative proctitis [facility])    Past Surgical History:  Procedure Laterality Date   ANTERIOR CRUCIATE LIGAMENT REPAIR Left 08/10/2014   Procedure: ANTERIOR CRUCIATE LIGAMENT (ACL) REPAIR;  Surgeon: Genevie Kerns, MD;  Location: [facility];  Service: Orthopedics;  Laterality: Left;   HIP SURGERY Right 08/25/2019   PAO surgery [facility]   HIP SURGERY Bilateral 01/05/2021   [facility], had hardware taken out   KNEE ARTHROSCOPY Left 08/10/2014   Procedure: ARTHROSCOPY KNEE DEBRIDEMENT ALLOGRAFT ACL REVISION RECONSTRUCTION;  Surgeon: Genevie Kerns, MD;  Location: [facility];  Service: Orthopedics;  Laterality: Left;   KNEE ARTHROSCOPY W/ ACL RECONSTRUCTION Left 2006   SPINAL FUSION     widom tooth extraction     only had 1 wisdom tooth    Patient Active Problem List   Diagnosis Date Noted   Ulcerative proctitis with rectal bleeding (HCC) 06/16/2020   Bloating 06/16/2020   Chronic migraine without aura without status migrainosus, not intractable 08/19/2018   Migraine with aura and with status migrainosus, not intractable 08/19/2018   Pain in left shoulder 08/19/2018   Pain in left ankle and joints of left foot 05/09/2018   Pain in left hip 05/09/2018   Cervicalgia 05/09/2018   Chronic rhinitis 11/12/2017   Angioedema 11/12/2017   Allergic reaction 11/12/2017    Chronic sinusitis 11/12/2017   Cervical intraepithelial neoplasia grade 1 10/01/2017   GAD (generalized anxiety disorder) 09/17/2014   ACL graft tear (HCC) 08/10/2014   S/P ACL reconstruction 08/10/2014   Migraine headache 05/15/2010     REFERRING PROVIDER: Allan Ishihara, MD  REFERRING DIAG: [phone] (ICD-10-CM) - Status post hip surgery / s/p arthroscopy of hip  S32.691K (ICD-10-CM) - Other specified fracture of right ischium, subsequent encounter for fracture with nonunion  THERAPY DIAG:  No diagnosis found.  Rationale for Evaluation and Treatment: Rehabilitation  ONSET DATE: DOS  12/27/2022   SUBJECTIVE:   SUBJECTIVE STATEMENT: Pt is 7 months post op.   Pt states her piriformis area is bothering her the most right now.  Pt states the dry needling was great prior Rx.  Pt feels that dry needling activated her piriformis.  It feels like her muscle woke up though is hurting.        PERTINENT HISTORY: Hx of hip surgeries: -Revision R hip femoroplasty, arthroscopy with labral repair, open internal fixation post pelv ring Fx on 12/27/2022 -R hip arthroscopy with femoroplasty, labral repair, and pelvis osteotomy on 08/24/2019 (PAO)  -L hip labral repair and hip bone osteotomy, femoroplasty on 03/21/20 -hardware removal on 01/05/21 in bilat hips     L ACL repair x 2 in 06 and 2016 GAD Migraines Ulcerative colitis  and celiac Mast cell activation syndrome Cervical fusion in 2020 and 2021   PAIN: Are you having any pain?  Yes  NPRS: 6/10 current, 8/10 worst, 4/10 best  Location:  R anterior hip, R glute/piriformis, HS, PSIS area Type:  constant  PRECAUTIONS: Other: per surgical protocol, R hip labral repair, L ACL repair x 2, cervical fusion x 2   WEIGHT BEARING RESTRICTIONS: none indicated on orders  FALLS:  Has patient fallen in last 6 months? No  LIVING ENVIRONMENT: Lives with: lives with their spouse Lives in:  2 story home but moving into a 1 story Stairs:  yes   PLOF: Independent  PATIENT GOALS: to be pain-free, improve ROM, improve quality of life    OBJECTIVE:  Note: Objective measures were completed at Evaluation unless otherwise noted.  DIAGNOSTIC FINDINGS: Pelvis x ray on 04/05/23:   Findings/Impression:  Hardware: Right acetabular plate and screw fixation without complication. Alignment: No dislocation. Bones: No acute fracture. Healing osteotomy of the right superior pubic ramus root. Joints: Mild degenerative changes of the left hip. Soft Tissues: Unremarkable.    PATIENT SURVEYS:  LEFS Initial/current:  41/80 / 35/80  COGNITION: Overall cognitive status: Within functional limits for tasks assessed       LOWER EXTREMITY ROM:  Active ROM Right eval Left eval Right 4/22  Hip flexion 105 118 104  Hip extension     Hip abduction 22 30 28   Hip adduction     Hip internal rotation 42 24   Hip external rotation 24 40 32  Knee flexion     Knee extension     Ankle dorsiflexion     Ankle plantarflexion     Ankle inversion     Ankle eversion      (Blank rows = not tested)  LOWER EXTREMITY HHD:  Hip abd (seated):  R:  11.3, L:  16.9 LOWER EXTREMITY MMT:    MMT Right  Left   Hip flexion 11.6 18.6  Hip extension    Hip abduction 18.9 21.4  Hip adduction    Hip internal rotation    Hip external rotation    Knee flexion    Knee extension 23.8 32.6  Ankle dorsiflexion    Ankle plantarflexion    Ankle inversion    Ankle eversion     (Blank rows = not tested)   GAIT: Assistive device utilized: None Level of assistance: Complete Independence Comments:  Improved quality of gait with reduced limp though still favors R LE with gait.  Appears to have a R hip drop.  Decreased hip extension.  7/10 pain with ambulation.                                                                                                                                TREATMENT DATE:    6/9     6/5 Manual: Trigger point  release to lower lumbar spine, gluteals, and IT band. LAD  with grade 2 and 3 oscillations to right lower extremity  Neuro reeducation: Dead lift from 4 inch lift 26 pounds 3 x 12 RPE of 6 Deep squat 3 x 10 using TRX for control   6/2 Elliptical lvl 0 x 5 mins Lateral band walks with GTB around ankles 3x10 Retro Walks/Monster Walks with RTB 3x10 Squats with 5# 2x12  Step onto air-ex fwd and lateral approx 2x10  Prone planks 3x30 sec Side planks 2 x 30 sec  5/30 Manual:  TPDN YES  Trigger Point Dry Needling  Subsequent Treatment: Instructions provided previously at initial dry needling treatment.   Patient Verbal Consent Given: Yes Education Handout Provided: Previously Provided Muscles Treated: Rt hip- TFL, glut med, piriformis, deep hip ERs, bil upper traps Electrical Stimulation Performed: No Treatment Response/Outcome: twitch with decreased concordant spasm  Glut set- coronal/caudal Qped Lt hip hike from Rt knee on double airex Split stance dead lift 10lb   08/07/23 Upright bike L2-3 x 6 mins Lateral band walks with GTB above knees 3x10 Retro walking with RTB around ankles x 2 laps Monster walks with RTB x 2 laps  Step onto air-ex fwd and lateral approx 12-15  Prone plank 2x30 sec Side planks 2x20 sec bilat  Q-ped birddogs   5/21  Ex bike warm up Muscle testing   LAQ 3x10 2 lbs   Neuro-re-ed  Supine march x10  Supine march w/ switch 2x10   Decompression 90/90 Heel press 3x10  DKTC 3x10 could feel sciatic pain    Step onto air-ex fwd and lateral 2x10       PATIENT EDUCATION:  Education details:  POC, rationale of exercises and exercise volume/intensity, rationale of interventions, relevant anatomy, sx response, and exercise form.  Person educated: Patient Education method: Explanation, Demonstration, Tactile cues, Verbal cues, Education comprehension: verbalized understanding, returned demonstration, verbal cues required, tactile cues required,  and needs further education  HOME EXERCISE PROGRAM: Access Code: URL: [email or web address]   3-layer heel lift PRI Rt sidelying knee over knee (handout provided)   ASSESSMENT:  CLINICAL IMPRESSION: Patient with trigger points.  Therapy will work to include heels more bar spine.  We increased the weight with her dead lift.  We also worked on deep squats using TRX straps.  She has kettle bells at home.  She is advised to continue to progress her strengthening.  She reported improved pain following manual therapy.  Therapy will continue to progress as tolerated.   OBJECTIVE IMPAIRMENTS: Abnormal gait, decreased activity tolerance, decreased endurance, decreased mobility, difficulty walking, decreased ROM, decreased strength, hypomobility, and pain.   ACTIVITY LIMITATIONS: lifting, bending, sitting, standing, squatting, stairs, transfers, and locomotion level  PARTICIPATION LIMITATIONS: cleaning, laundry, driving, shopping, and community activity  PERSONAL FACTORS: Time since onset of injury/illness/exacerbation and 3+ comorbidities: R hip labral repair, L ACL repair x 2, cervical fusion x 2 are also affecting patient's functional outcome.   REHAB POTENTIAL: Good  CLINICAL DECISION MAKING: Evolving/moderate complexity  EVALUATION COMPLEXITY: Moderate   GOALS:  SHORT TERM GOALS: Target date:  05/28/2023   Pt will be independent with HEP for improved pain, strength, tolerance to activity, and function.  Baseline: Goal status: met for short term  2.  Pt will tolerate aquatic therapy without adverse effects in order to perform exercises in a reduced stress environment and improve functional mobility, tolerance to activity, strength, and pain.  Baseline:  Goal status: MET -05/24/23 Target date:  05/21/2023   3.  Pt's worst pain will be no >  than 8/10 for improved tolerance to activity and mobility.  Baseline: 5/10 Goal status: MET - 05/24/23  4.  Pt will  report at least a 25% improvement in her daily mobility.   Baseline: 20% Goal status: PARTIALLY MET - 05/24/23 and 4/22  5.  Pt will demonstrate improved quality of gait including improved Wb'ing thru R LE, reduced hip drop, and reduced limp.  Baseline:  Goal status: PARTIALLY MET  4/22  6.  Pt will perform a 6 inch step up with good form and stability without significant pain Baseline:  Goal status: MET -05/24/23 Target date:  06/04/2023  7.  Pt will ambulate with a normalized heel to toe gait pattern without limping.   Goal status:  PROGRESSING  4/22  Target date:  06/18/2023    LONG TERM GOALS: Target date: 08/20/2023   Pt will ambulate extended community distance without significant pain and difficulty.  Baseline:  Goal status: ONGOING  2.  Pt will ascend and descend stairs with a reciprocal gait with a rail.  Baseline:  Goal status: ONGOING  3.  Pt will be able to walk her neighborhood without significant pain. Baseline:  Goal status: In progress -06/13/23  4.  Pt will be able to perform her ADLs and IADLs including household chores without significant pain and difficulty.  Baseline:  Goal status: ONGOING  5.  Pt will squat with symmetrical Wb'ing in bilat LE's and good form without increased pain for improved functional strength.  Baseline:  Goal status: INITIAL  6.  Pt will demo 4 to 4+/5 strength in R hip abd, 4/5 in R hip flex, and 5/5 in R knee ext for improved performance of and tolerance with functional mobility.  Baseline:  Goal status: INITIAL   PLAN:  PT FREQUENCY:   2-3x/wk  PT DURATION: 9 weeks  PLANNED INTERVENTIONS: 97164- PT Re-evaluation, 97110-Therapeutic exercises, 97530- Therapeutic activity, 97112- Neuromuscular re-education, 97535- Self Care, 16109- Manual therapy, [phone]- Gait training, [phone]- Aquatic Therapy, [phone]- Electrical stimulation (unattended), Patient/Family education, Balance training, Stair training, Taping, Dry Needling, Joint  mobilization, Spinal mobilization, Scar mobilization, Cryotherapy, and Moist heat  PLAN FOR NEXT SESSION:  Cont with land based therapy.  Cont with DN and exercises per protocol per pt tolerance.    Signa Drier  08/04/23 8:13 AM

## 2023-08-05 ENCOUNTER — Ambulatory Visit (HOSPITAL_BASED_OUTPATIENT_CLINIC_OR_DEPARTMENT_OTHER): Admitting: Physical Therapy

## 2023-08-05 ENCOUNTER — Encounter (HOSPITAL_BASED_OUTPATIENT_CLINIC_OR_DEPARTMENT_OTHER): Payer: Self-pay | Admitting: Physical Therapy

## 2023-08-05 DIAGNOSIS — M25651 Stiffness of right hip, not elsewhere classified: Secondary | ICD-10-CM

## 2023-08-05 DIAGNOSIS — M6281 Muscle weakness (generalized): Secondary | ICD-10-CM

## 2023-08-05 DIAGNOSIS — R262 Difficulty in walking, not elsewhere classified: Secondary | ICD-10-CM

## 2023-08-05 DIAGNOSIS — M25551 Pain in right hip: Secondary | ICD-10-CM

## 2023-08-05 NOTE — Therapy (Signed)
 OUTPATIENT PHYSICAL THERAPY LOWER EXTREMITY TREATMENT     Patient Name: [name] MRN: [id number] DOB:[birth year], 38 y.o., female Today's Date: 08/05/2023  END OF SESSION:  PT End of Session - 08/05/23 1409     Visit Number 31    Number of Visits 36    Date for PT Re-Evaluation 08/20/23    Authorization Type [employer]    PT Start Time 1408    PT Stop Time 1447    PT Time Calculation (min) 39 min    Activity Tolerance Patient tolerated treatment well    Behavior During Therapy WFL for tasks assessed/performed                            Past Medical History:  Diagnosis Date   ACL graft tear (HCC)    left knee   Allergy     Angio-edema    Anxiety    Complication of anesthesia    woke up during surgery   Depression    Family history of adverse reaction to anesthesia     mom is difficult to put to sleep   IUD (intrauterine device) in place    Knee pain, right    Migraine headache    Sleep paralysis    Ulcerative proctitis (HCC)    Past Surgical History:  Procedure Laterality Date   ANTERIOR CRUCIATE LIGAMENT REPAIR Left 08/10/2014   Procedure: ANTERIOR CRUCIATE LIGAMENT (ACL) REPAIR;  Surgeon: Genevie Kerns, MD;  Location[facility];  Service: Orthopedics;  Laterality: Left;   HIP SURGERY Right 08/25/2019   PAO surgery [facility]   HIP SURGERY Bilateral 01/05/2021   [facility], had hardware taken out   KNEE ARTHROSCOPY Left 08/10/2014   Procedure: ARTHROSCOPY KNEE DEBRIDEMENT ALLOGRAFT ACL REVISION RECONSTRUCTION;  Surgeon: Genevie Kerns, MD;  Location: [facility];  Service: Orthopedics;  Laterality: Left;   KNEE ARTHROSCOPY W/ ACL RECONSTRUCTION Left 2006   SPINAL FUSION     widom tooth extraction     only had 1 wisdom tooth    Patient Active Problem List   Diagnosis Date Noted   Ulcerative proctitis with rectal bleeding (HCC) 06/16/2020   Bloating 06/16/2020   Chronic migraine without aura without status  migrainosus, not intractable 08/19/2018   Migraine with aura and with status migrainosus, not intractable 08/19/2018   Pain in left shoulder 08/19/2018   Pain in left ankle and joints of left foot 05/09/2018   Pain in left hip 05/09/2018   Cervicalgia 05/09/2018   Chronic rhinitis 11/12/2017   Angioedema 11/12/2017   Allergic reaction 11/12/2017   Chronic sinusitis 11/12/2017   Cervical intraepithelial neoplasia grade 1 10/01/2017   GAD (generalized anxiety disorder) 09/17/2014   ACL graft tear (HCC) 08/10/2014   S/P ACL reconstruction 08/10/2014   Migraine headache 05/15/2010     REFERRING PROVIDER: Allan Ishihara, MD  REFERRING DIAG: [phone] (ICD-10-CM) - Status post hip surgery / s/p arthroscopy of hip  S32.691K (ICD-10-CM) - Other specified fracture of right ischium, subsequent encounter for fracture with nonunion  THERAPY DIAG:  Pain in right hip  Muscle weakness (generalized)  Difficulty in walking, not elsewhere classified  Stiffness of right hip, not elsewhere classified  Rationale for Evaluation and Treatment: Rehabilitation  ONSET DATE: DOS  12/27/2022   SUBJECTIVE:   SUBJECTIVE STATEMENT: Tightness after doing a bunch of yard work.       PERTINENT HISTORY: Hx of hip surgeries: -Revision R  hip femoroplasty, arthroscopy with labral repair, open internal fixation post pelv ring Fx on 12/27/2022 -R hip arthroscopy with femoroplasty, labral repair, and pelvis osteotomy on 08/24/2019 (PAO)  -L hip labral repair and hip bone osteotomy, femoroplasty on 03/21/20 -hardware removal on 01/05/21 in bilat hips     L ACL repair x 2 in 06 and 2016 GAD Migraines Ulcerative colitis and celiac Mast cell activation syndrome Cervical fusion in 2020 and 2021   PAIN: Are you having any pain?  Yes  NPRS: 6/10 current, 8/10 worst, 4/10 best  Location:  R anterior hip, R glute/piriformis, HS, PSIS area Type:  constant  PRECAUTIONS: Other: per surgical protocol, R  hip labral repair, L ACL repair x 2, cervical fusion x 2   WEIGHT BEARING RESTRICTIONS: none indicated on orders  FALLS:  Has patient fallen in last 6 months? No  LIVING ENVIRONMENT: Lives with: lives with their spouse Lives in:  2 story home but moving into a 1 story Stairs: yes   PLOF: Independent  PATIENT GOALS: to be pain-free, improve ROM, improve quality of life    OBJECTIVE:  Note: Objective measures were completed at Evaluation unless otherwise noted.  DIAGNOSTIC FINDINGS: Pelvis x ray on 04/05/23:   Findings/Impression:  Hardware: Right acetabular plate and screw fixation without complication. Alignment: No dislocation. Bones: No acute fracture. Healing osteotomy of the right superior pubic ramus root. Joints: Mild degenerative changes of the left hip. Soft Tissues: Unremarkable.    PATIENT SURVEYS:  LEFS Initial/current:  41/80 / 35/80 LEFS 6/9: 63/80  COGNITION: Overall cognitive status: Within functional limits for tasks assessed       LOWER EXTREMITY ROM:  Active ROM Right eval Left eval Right 4/22  Hip flexion 105 118 104  Hip extension     Hip abduction 22 30 28   Hip adduction     Hip internal rotation 42 24   Hip external rotation 24 40 32  Knee flexion     Knee extension     Ankle dorsiflexion     Ankle plantarflexion     Ankle inversion     Ankle eversion      (Blank rows = not tested)  LOWER EXTREMITY HHD:  Hip abd (seated):  R:  11.3, L:  16.9 LOWER EXTREMITY MMT:    MMT Right 5/21 Left 5/21  Hip flexion 11.6 18.6  Hip extension    Hip abduction 18.9 21.4  Hip adduction    Hip internal rotation    Hip external rotation    Knee flexion    Knee extension 23.8 32.6  Ankle dorsiflexion    Ankle plantarflexion    Ankle inversion    Ankle eversion     (Blank rows = not tested)   GAIT: Assistive device utilized: None Level of assistance: Complete Independence Comments:  Improved quality of gait with reduced limp though  still favors R LE with gait.  Appears to have a R hip drop.  Decreased hip extension.  7/10 pain with ambulation.  TREATMENT DATE:    6/9: IASTM Rt HS Qped- knee flexion, hip ext with knee flexed, knee ext, lower straight leg  Done bilaterally, demo in primal push up Wall bridges  +heel lift, + heel lift with single leg lower Straight leg lift/lower from wall Primal push up-down dog    6/5 Manual: Trigger point release to lower lumbar spine, gluteals, and IT band. LAD with grade 2 and 3 oscillations to right lower extremity  Neuro reeducation: Dead lift from 4 inch lift 26 pounds 3 x 12 RPE of 6 Deep squat 3 x 10 using TRX for control   6/2 Elliptical lvl 0 x 5 mins Lateral band walks with GTB around ankles 3x10 Retro Walks/Monster Walks with RTB 3x10 Squats with 5# 2x12  Step onto air-ex fwd and lateral approx 2x10  Prone planks 3x30 sec Side planks 2 x 30 sec      PATIENT EDUCATION:  Education details:  POC, rationale of exercises and exercise volume/intensity, rationale of interventions, relevant anatomy, sx response, and exercise form.  Person educated: Patient Education method: Explanation, Demonstration, Tactile cues, Verbal cues, Education comprehension: verbalized understanding, returned demonstration, verbal cues required, tactile cues required, and needs further education  HOME EXERCISE PROGRAM: Access Code: URL: [email or web address]   3-layer heel lift PRI Rt sidelying knee over knee (handout provided)   ASSESSMENT:  CLINICAL IMPRESSION: Able to work into deeper hip flexion and core control for pelvic strengthening without impingement. Will continue to progress central stability as well as bil LE strengthening.    OBJECTIVE IMPAIRMENTS: Abnormal gait, decreased activity tolerance, decreased  endurance, decreased mobility, difficulty walking, decreased ROM, decreased strength, hypomobility, and pain.   ACTIVITY LIMITATIONS: lifting, bending, sitting, standing, squatting, stairs, transfers, and locomotion level  PARTICIPATION LIMITATIONS: cleaning, laundry, driving, shopping, and community activity  PERSONAL FACTORS: Time since onset of injury/illness/exacerbation and 3+ comorbidities: R hip labral repair, L ACL repair x 2, cervical fusion x 2 are also affecting patient's functional outcome.   REHAB POTENTIAL: Good  CLINICAL DECISION MAKING: Evolving/moderate complexity  EVALUATION COMPLEXITY: Moderate   GOALS:  SHORT TERM GOALS: Target date:  05/28/2023   Pt will be independent with HEP for improved pain, strength, tolerance to activity, and function.  Baseline: Goal status: met for short term  2.  Pt will tolerate aquatic therapy without adverse effects in order to perform exercises in a reduced stress environment and improve functional mobility, tolerance to activity, strength, and pain.  Baseline:  Goal status: MET -05/24/23 Target date:  05/21/2023   3.  Pt's worst pain will be no > than 8/10 for improved tolerance to activity and mobility.  Baseline: 5/10 Goal status: MET - 05/24/23  4.  Pt will report at least a 25% improvement in her daily mobility.   Baseline:  Goal status: MET  5.  Pt will demonstrate improved quality of gait including improved Wb'ing thru R LE, reduced hip drop, and reduced limp.  Baseline:  Goal status: MET  6.  Pt will perform a 6 inch step up with good form and stability without significant pain Baseline:  Goal status: MET -05/24/23 Target date:  06/04/2023  7.  Pt will ambulate with a normalized heel to toe gait pattern without limping.   Goal status:  MET    LONG TERM GOALS: Target date: 08/20/2023   Pt will ambulate extended community distance without significant pain and difficulty.  Baseline:  Goal status: ONGOING  2.  Pt  will ascend and descend stairs  with a reciprocal gait with a rail.  Baseline:  Goal status: ONGOING  3.  Pt will be able to walk her neighborhood without significant pain. Baseline:  Goal status: In progress -06/13/23  4.  Pt will be able to perform her ADLs and IADLs including household chores without significant pain and difficulty.  Baseline:  Goal status: ONGOING  5.  Pt will squat with symmetrical Wb'ing in bilat LE's and good form without increased pain for improved functional strength.  Baseline:  Goal status: INITIAL  6.  Pt will demo 4 to 4+/5 strength in R hip abd, 4/5 in R hip flex, and 5/5 in R knee ext for improved performance of and tolerance with functional mobility.  Baseline:  Goal status: INITIAL   PLAN:  PT FREQUENCY:   2-3x/wk  PT DURATION: 9 weeks  PLANNED INTERVENTIONS: 97164- PT Re-evaluation, 97110-Therapeutic exercises, 97530- Therapeutic activity, 97112- Neuromuscular re-education, 97535- Self Care, 11914- Manual therapy, [phone]- Gait training, [phone]- Aquatic Therapy, [phone]- Electrical stimulation (unattended), Patient/Family education, Balance training, Stair training, Taping, Dry Needling, Joint mobilization, Spinal mobilization, Scar mobilization, Cryotherapy, and Moist heat  PLAN FOR NEXT SESSION:  Cont with land based therapy.  Cont with DN and exercises per protocol per pt tolerance.    Demetria Lightsey C. Christiaan Strebeck PT, DPT 08/05/23 2:47 PM

## 2023-08-07 ENCOUNTER — Ambulatory Visit (HOSPITAL_BASED_OUTPATIENT_CLINIC_OR_DEPARTMENT_OTHER): Admitting: Physical Therapy

## 2023-08-07 DIAGNOSIS — M25551 Pain in right hip: Secondary | ICD-10-CM

## 2023-08-07 DIAGNOSIS — R262 Difficulty in walking, not elsewhere classified: Secondary | ICD-10-CM

## 2023-08-07 DIAGNOSIS — M25651 Stiffness of right hip, not elsewhere classified: Secondary | ICD-10-CM

## 2023-08-07 DIAGNOSIS — M6281 Muscle weakness (generalized): Secondary | ICD-10-CM

## 2023-08-07 NOTE — Therapy (Signed)
 OUTPATIENT PHYSICAL THERAPY LOWER EXTREMITY TREATMENT     Patient Name: [name] MRN: [id number] DOB:Jun 13, 1985, 38 y.o., female Today's Date: 08/08/2023  END OF SESSION:  PT End of Session - 08/08/23 0841     Visit Number 32    Number of Visits 36    Date for PT Re-Evaluation 08/20/23    Authorization Type U[employer]    PT Start Time 1430    PT Stop Time 1512    PT Time Calculation (min) 42 min    Activity Tolerance Patient tolerated treatment well    Behavior During Therapy WFL for tasks assessed/performed                          Past Medical History:  Diagnosis Date   ACL graft tear (HCC)    left knee   Allergy     Angio-edema    Anxiety    Complication of anesthesia    woke up during surgery   Depression    Family history of adverse reaction to anesthesia     mom is difficult to put to sleep   IUD (intrauterine device) in place    Knee pain, right    Migraine headache    Sleep paralysis    Ulcerative proctitis (HCC)    Past Surgical History:  Procedure Laterality Date   ANTERIOR CRUCIATE LIGAMENT REPAIR Left 08/10/2014   Procedure: ANTERIOR CRUCIATE LIGAMENT (ACL) REPAIR;  Surgeon: Genevie Kerns, MD;  Location: [facility];  Service: Orthopedics;  Laterality: Left;   HIP SURGERY Right 08/25/2019   PAO surgery [facility]   HIP SURGERY Bilateral 01/05/2021   [facility], had hardware taken out   KNEE ARTHROSCOPY Left 08/10/2014   Procedure: ARTHROSCOPY KNEE DEBRIDEMENT ALLOGRAFT ACL REVISION RECONSTRUCTION;  Surgeon: Genevie Kerns, MD;  Location: [facility];  Service: Orthopedics;  Laterality: Left;   KNEE ARTHROSCOPY W/ ACL RECONSTRUCTION Left 2006   SPINAL FUSION     widom tooth extraction     only had 1 wisdom tooth    Patient Active Problem List   Diagnosis Date Noted   Ulcerative proctitis with rectal bleeding (HCC) 06/16/2020   Bloating 06/16/2020   Chronic migraine without aura without status  migrainosus, not intractable 08/19/2018   Migraine with aura and with status migrainosus, not intractable 08/19/2018   Pain in left shoulder 08/19/2018   Pain in left ankle and joints of left foot 05/09/2018   Pain in left hip 05/09/2018   Cervicalgia 05/09/2018   Chronic rhinitis 11/12/2017   Angioedema 11/12/2017   Allergic reaction 11/12/2017   Chronic sinusitis 11/12/2017   Cervical intraepithelial neoplasia grade 1 10/01/2017   GAD (generalized anxiety disorder) 09/17/2014   ACL graft tear (HCC) 08/10/2014   S/P ACL reconstruction 08/10/2014   Migraine headache 05/15/2010     REFERRING PROVIDER: Allan Ishihara, MD  REFERRING DIAG: [phone] (ICD-10-CM) - Status post hip surgery / s/p arthroscopy of hip  S32.691K (ICD-10-CM) - Other specified fracture of right ischium, subsequent encounter for fracture with nonunion  THERAPY DIAG:  Pain in right hip  Muscle weakness (generalized)  Difficulty in walking, not elsewhere classified  Stiffness of right hip, not elsewhere classified  Rationale for Evaluation and Treatment: Rehabilitation  ONSET DATE: DOS  12/27/2022   SUBJECTIVE:   SUBJECTIVE STATEMENT: Tightness after doing a bunch of yard work.       PERTINENT HISTORY: Hx of hip surgeries: -Revision R hip femoroplasty,  arthroscopy with labral repair, open internal fixation post pelv ring Fx on 12/27/2022 -R hip arthroscopy with femoroplasty, labral repair, and pelvis osteotomy on 08/24/2019 (PAO)  -L hip labral repair and hip bone osteotomy, femoroplasty on 03/21/20 -hardware removal on 01/05/21 in bilat hips     L ACL repair x 2 in 06 and 2016 GAD Migraines Ulcerative colitis and celiac Mast cell activation syndrome Cervical fusion in 2020 and 2021   PAIN: Are you having any pain?  Yes  NPRS: 6/10 current, 8/10 worst, 4/10 best  Location:  R anterior hip, R glute/piriformis, HS, PSIS area Type:  constant  PRECAUTIONS: Other: per surgical protocol, R  hip labral repair, L ACL repair x 2, cervical fusion x 2   WEIGHT BEARING RESTRICTIONS: none indicated on orders  FALLS:  Has patient fallen in last 6 months? No  LIVING ENVIRONMENT: Lives with: lives with their spouse Lives in:  2 story home but moving into a 1 story Stairs: yes   PLOF: Independent  PATIENT GOALS: to be pain-free, improve ROM, improve quality of life    OBJECTIVE:  Note: Objective measures were completed at Evaluation unless otherwise noted.  DIAGNOSTIC FINDINGS: Pelvis x ray on 04/05/23:   Findings/Impression:  Hardware: Right acetabular plate and screw fixation without complication. Alignment: No dislocation. Bones: No acute fracture. Healing osteotomy of the right superior pubic ramus root. Joints: Mild degenerative changes of the left hip. Soft Tissues: Unremarkable.    PATIENT SURVEYS:  LEFS Initial/current:  41/80 / 35/80 LEFS 6/9: 63/80  COGNITION: Overall cognitive status: Within functional limits for tasks assessed       LOWER EXTREMITY ROM:  Active ROM Right eval Left eval Right 4/22  Hip flexion 105 118 104  Hip extension     Hip abduction 22 30 28   Hip adduction     Hip internal rotation 42 24   Hip external rotation 24 40 32  Knee flexion     Knee extension     Ankle dorsiflexion     Ankle plantarflexion     Ankle inversion     Ankle eversion      (Blank rows = not tested)  LOWER EXTREMITY HHD:  Hip abd (seated):  R:  11.3, L:  16.9 LOWER EXTREMITY MMT:    MMT Right 5/21 Left 5/21  Hip flexion 11.6 18.6  Hip extension    Hip abduction 18.9 21.4  Hip adduction    Hip internal rotation    Hip external rotation    Knee flexion    Knee extension 23.8 32.6  Ankle dorsiflexion    Ankle plantarflexion    Ankle inversion    Ankle eversion     (Blank rows = not tested)   GAIT: Assistive device utilized: None Level of assistance: Complete Independence Comments:  Improved quality of gait with reduced limp though  still favors R LE with gait.  Appears to have a R hip drop.  Decreased hip extension.  7/10 pain with ambulation.  TREATMENT DATE:   6/12 Manual: Trigger point release to lower lumbar spine, gluteals, and IT band. LAD with grade 2 and 3 oscillations to right lower extremity  Neuro-re-ed:  Cable walks fwd and back 15 lbs x10 each  Air-ex hop and hold x20 fwd and lateral  Heel/toe rocks with hold at end 2x15    6/9: IASTM Rt HS Qped- knee flexion, hip ext with knee flexed, knee ext, lower straight leg  Done bilaterally, demo in primal push up Wall bridges  +heel lift, + heel lift with single leg lower Straight leg lift/lower from wall Primal push up-down dog    6/5 Manual: Trigger point release to lower lumbar spine, gluteals, and IT band. LAD with grade 2 and 3 oscillations to right lower extremity  Neuro reeducation: Dead lift from 4 inch lift 26 pounds 3 x 12 RPE of 6 Deep squat 3 x 10 using TRX for control   6/2 Elliptical lvl 0 x 5 mins Lateral band walks with GTB around ankles 3x10 Retro Walks/Monster Walks with RTB 3x10 Squats with 5# 2x12  Step onto air-ex fwd and lateral approx 2x10  Prone planks 3x30 sec Side planks 2 x 30 sec      PATIENT EDUCATION:  Education details:  POC, rationale of exercises and exercise volume/intensity, rationale of interventions, relevant anatomy, sx response, and exercise form.  Person educated: Patient Education method: Explanation, Demonstration, Tactile cues, Verbal cues, Education comprehension: verbalized understanding, returned demonstration, verbal cues required, tactile cues required, and needs further education  HOME EXERCISE PROGRAM: Access Code: URL: [email or web address]   3-layer heel lift PRI Rt sidelying knee over knee (handout  provided)   ASSESSMENT:  CLINICAL IMPRESSION: The patient tolerated treatment well. We focused on eccentric loading and control exercises today. She had mild pain but no more pain then she usually does . She continues to have a large trigger point in her gluteal. We reviewed the refferal patterns for diffierent spots in her glutes.   OBJECTIVE IMPAIRMENTS: Abnormal gait, decreased activity tolerance, decreased endurance, decreased mobility, difficulty walking, decreased ROM, decreased strength, hypomobility, and pain.   ACTIVITY LIMITATIONS: lifting, bending, sitting, standing, squatting, stairs, transfers, and locomotion level  PARTICIPATION LIMITATIONS: cleaning, laundry, driving, shopping, and community activity  PERSONAL FACTORS: Time since onset of injury/illness/exacerbation and 3+ comorbidities: R hip labral repair, L ACL repair x 2, cervical fusion x 2 are also affecting patient's functional outcome.   REHAB POTENTIAL: Good  CLINICAL DECISION MAKING: Evolving/moderate complexity  EVALUATION COMPLEXITY: Moderate   GOALS:  SHORT TERM GOALS: Target date:  05/28/2023   Pt will be independent with HEP for improved pain, strength, tolerance to activity, and function.  Baseline: Goal status: met for short term  2.  Pt will tolerate aquatic therapy without adverse effects in order to perform exercises in a reduced stress environment and improve functional mobility, tolerance to activity, strength, and pain.  Baseline:  Goal status: MET -05/24/23 Target date:  05/21/2023   3.  Pt's worst pain will be no > than 8/10 for improved tolerance to activity and mobility.  Baseline: 5/10 Goal status: MET - 05/24/23  4.  Pt will report at least a 25% improvement in her daily mobility.   Baseline:  Goal status: MET  5.  Pt will demonstrate improved quality of gait including improved Wb'ing thru R LE, reduced hip drop, and reduced limp.  Baseline:  Goal status: MET  6.  Pt will  perform a 6 inch step up with  good form and stability without significant pain Baseline:  Goal status: MET -05/24/23 Target date:  06/04/2023  7.  Pt will ambulate with a normalized heel to toe gait pattern without limping.   Goal status:  MET    LONG TERM GOALS: Target date: 08/20/2023   Pt will ambulate extended community distance without significant pain and difficulty.  Baseline:  Goal status: ONGOING  2.  Pt will ascend and descend stairs with a reciprocal gait with a rail.  Baseline:  Goal status: ONGOING  3.  Pt will be able to walk her neighborhood without significant pain. Baseline:  Goal status: In progress -06/13/23  4.  Pt will be able to perform her ADLs and IADLs including household chores without significant pain and difficulty.  Baseline:  Goal status: ONGOING  5.  Pt will squat with symmetrical Wb'ing in bilat LE's and good form without increased pain for improved functional strength.  Baseline:  Goal status: INITIAL  6.  Pt will demo 4 to 4+/5 strength in R hip abd, 4/5 in R hip flex, and 5/5 in R knee ext for improved performance of and tolerance with functional mobility.  Baseline:  Goal status: INITIAL   PLAN:  PT FREQUENCY:   2-3x/wk  PT DURATION: 9 weeks  PLANNED INTERVENTIONS: 97164- PT Re-evaluation, 97110-Therapeutic exercises, 97530- Therapeutic activity, 97112- Neuromuscular re-education, 97535- Self Care, 16109- Manual therapy, [phone]- Gait training, [phone]- Aquatic Therapy, [phone]- Electrical stimulation (unattended), Patient/Family education, Balance training, Stair training, Taping, Dry Needling, Joint mobilization, Spinal mobilization, Scar mobilization, Cryotherapy, and Moist heat  PLAN FOR NEXT SESSION:  Cont with land based therapy.  Cont with DN and exercises per protocol per pt tolerance.    Signa Drier PT DPT 08/08/23 8:50 AM

## 2023-08-08 ENCOUNTER — Encounter (HOSPITAL_BASED_OUTPATIENT_CLINIC_OR_DEPARTMENT_OTHER): Payer: Self-pay | Admitting: Physical Therapy

## 2023-08-08 ENCOUNTER — Encounter (HOSPITAL_BASED_OUTPATIENT_CLINIC_OR_DEPARTMENT_OTHER): Admitting: Physical Therapy

## 2023-08-12 ENCOUNTER — Ambulatory Visit (HOSPITAL_BASED_OUTPATIENT_CLINIC_OR_DEPARTMENT_OTHER): Admitting: Physical Therapy

## 2023-08-12 DIAGNOSIS — M25551 Pain in right hip: Secondary | ICD-10-CM

## 2023-08-12 DIAGNOSIS — M25651 Stiffness of right hip, not elsewhere classified: Secondary | ICD-10-CM

## 2023-08-12 DIAGNOSIS — M6281 Muscle weakness (generalized): Secondary | ICD-10-CM

## 2023-08-12 DIAGNOSIS — R262 Difficulty in walking, not elsewhere classified: Secondary | ICD-10-CM

## 2023-08-12 NOTE — Therapy (Signed)
 OUTPATIENT PHYSICAL THERAPY LOWER EXTREMITY TREATMENT     Patient Name: [name] MRN: [id number] DOB:November 30, 1985, 38 y.o., female Today's Date: 08/13/2023  END OF SESSION:  PT End of Session - 08/12/23 1452     Visit Number 33    Number of Visits 36    Date for PT Re-Evaluation 08/20/23    Authorization Type UHC    PT Start Time 1404    PT Stop Time 1447    PT Time Calculation (min) 43 min    Activity Tolerance Patient tolerated treatment well    Behavior During Therapy WFL for tasks assessed/performed                           Past Medical History:  Diagnosis Date   ACL graft tear (HCC)    left knee   Allergy     Angio-edema    Anxiety    Complication of anesthesia    woke up during surgery   Depression    Family history of adverse reaction to anesthesia     mom is difficult to put to sleep   IUD (intrauterine device) in place    Knee pain, right    Migraine headache    Sleep paralysis    Ulcerative proctitis (HCC)    Past Surgical History:  Procedure Laterality Date   ANTERIOR CRUCIATE LIGAMENT REPAIR Left 08/10/2014   Procedure: ANTERIOR CRUCIATE LIGAMENT (ACL) REPAIR;  Surgeon: Genevie Kerns, MD;  Location: [facility];  Service: Orthopedics;  Laterality: Left;   HIP SURGERY Right 08/25/2019   PAO surgery [facility]   HIP SURGERY Bilateral 01/05/2021   [facility], had hardware taken out   KNEE ARTHROSCOPY Left 08/10/2014   Procedure: ARTHROSCOPY KNEE DEBRIDEMENT ALLOGRAFT ACL REVISION RECONSTRUCTION;  Surgeon: Genevie Kerns, MD;  Location: [facility];  Service: Orthopedics;  Laterality: Left;   KNEE ARTHROSCOPY W/ ACL RECONSTRUCTION Left 2006   SPINAL FUSION     widom tooth extraction     only had 1 wisdom tooth    Patient Active Problem List   Diagnosis Date Noted   Ulcerative proctitis with rectal bleeding (HCC) 06/16/2020   Bloating 06/16/2020   Chronic migraine without aura without status  migrainosus, not intractable 08/19/2018   Migraine with aura and with status migrainosus, not intractable 08/19/2018   Pain in left shoulder 08/19/2018   Pain in left ankle and joints of left foot 05/09/2018   Pain in left hip 05/09/2018   Cervicalgia 05/09/2018   Chronic rhinitis 11/12/2017   Angioedema 11/12/2017   Allergic reaction 11/12/2017   Chronic sinusitis 11/12/2017   Cervical intraepithelial neoplasia grade 1 10/01/2017   GAD (generalized anxiety disorder) 09/17/2014   ACL graft tear (HCC) 08/10/2014   S/P ACL reconstruction 08/10/2014   Migraine headache 05/15/2010     REFERRING PROVIDER: Allan Ishihara, MD  REFERRING DIAG: [phone] (ICD-10-CM) - Status post hip surgery / s/p arthroscopy of hip  S32.691K (ICD-10-CM) - Other specified fracture of right ischium, subsequent encounter for fracture with nonunion  THERAPY DIAG:  Pain in right hip  Muscle weakness (generalized)  Difficulty in walking, not elsewhere classified  Stiffness of right hip, not elsewhere classified  Rationale for Evaluation and Treatment: Rehabilitation  ONSET DATE: DOS  12/27/2022   SUBJECTIVE:   SUBJECTIVE STATEMENT: Pt states her neck has been bothering her.  Pt denies any adverse effects after prior Rx.      PERTINENT  HISTORY: Hx of hip surgeries: -Revision R hip femoroplasty, arthroscopy with labral repair, open internal fixation post pelv ring Fx on 12/27/2022 -R hip arthroscopy with femoroplasty, labral repair, and pelvis osteotomy on 08/24/2019 (PAO)  -L hip labral repair and hip bone osteotomy, femoroplasty on 03/21/20 -hardware removal on 01/05/21 in bilat hips     L ACL repair x 2 in 06 and 2016 GAD Migraines Ulcerative colitis and celiac Mast cell activation syndrome Cervical fusion in 2020 and 2021   PAIN: Are you having any pain?  Yes  NPRS: 4/10 current, 8/10 worst, 4/10 best  Location:  R anterior hip, R glute/piriformis, HS, PSIS area Type:   constant  PRECAUTIONS: Other: per surgical protocol, R hip labral repair, L ACL repair x 2, cervical fusion x 2   WEIGHT BEARING RESTRICTIONS: none indicated on orders  FALLS:  Has patient fallen in last 6 months? No  LIVING ENVIRONMENT: Lives with: lives with their spouse Lives in:  2 story home but moving into a 1 story Stairs: yes   PLOF: Independent  PATIENT GOALS: to be pain-free, improve ROM, improve quality of life    OBJECTIVE:  Note: Objective measures were completed at Evaluation unless otherwise noted.  DIAGNOSTIC FINDINGS: Pelvis x ray on 04/05/23:   Findings/Impression:  Hardware: Right acetabular plate and screw fixation without complication. Alignment: No dislocation. Bones: No acute fracture. Healing osteotomy of the right superior pubic ramus root. Joints: Mild degenerative changes of the left hip. Soft Tissues: Unremarkable.    PATIENT SURVEYS:  LEFS Initial/current:  41/80 / 35/80 LEFS 6/9: 63/80  COGNITION: Overall cognitive status: Within functional limits for tasks assessed       LOWER EXTREMITY ROM:  Active ROM Right eval Left eval Right 4/22  Hip flexion 105 118 104  Hip extension     Hip abduction 22 30 28   Hip adduction     Hip internal rotation 42 24   Hip external rotation 24 40 32  Knee flexion     Knee extension     Ankle dorsiflexion     Ankle plantarflexion     Ankle inversion     Ankle eversion      (Blank rows = not tested)  LOWER EXTREMITY HHD:  Hip abd (seated):  R:  11.3, L:  16.9 LOWER EXTREMITY MMT:    MMT Right 5/21 Left 5/21  Hip flexion 11.6 18.6  Hip extension    Hip abduction 18.9 21.4  Hip adduction    Hip internal rotation    Hip external rotation    Knee flexion    Knee extension 23.8 32.6  Ankle dorsiflexion    Ankle plantarflexion    Ankle inversion    Ankle eversion     (Blank rows = not tested)   GAIT: Assistive device utilized: None Level of assistance: Complete  Independence Comments:  Improved quality of gait with reduced limp though still favors R LE with gait.  Appears to have a R hip drop.  Decreased hip extension.  7/10 pain with ambulation.  TREATMENT DATE:   6/16 Elliptical lvl 0-1 x 6 mins Qped birddogs x 10 Birddogs on p-ball x10 Retro Walks/Monster Walks with RTB 3x10 Squats with 8# 2x10 Split squat with UE support x 10 reps Dead lift from 6 inch lift 26 pounds 2 x 12  Prone planks 3x30 sec Side planks x 30sec each    6/12 Manual: Trigger point release to lower lumbar spine, gluteals, and IT band. LAD with grade 2 and 3 oscillations to right lower extremity  Neuro-re-ed:  Cable walks fwd and back 15 lbs x10 each  Air-ex hop and hold x20 fwd and lateral  Heel/toe rocks with hold at end 2x15    6/9: IASTM Rt HS Qped- knee flexion, hip ext with knee flexed, knee ext, lower straight leg  Done bilaterally, demo in primal push up Wall bridges  +heel lift, + heel lift with single leg lower Straight leg lift/lower from wall Primal push up-down dog    6/5 Manual: Trigger point release to lower lumbar spine, gluteals, and IT band. LAD with grade 2 and 3 oscillations to right lower extremity  Neuro reeducation: Dead lift from 4 inch lift 26 pounds 3 x 12 RPE of 6 Deep squat 3 x 10 using TRX for control   6/2 Elliptical lvl 0 x 5 mins Lateral band walks with GTB around ankles 3x10 Retro Walks/Monster Walks with RTB 3x10 Squats with 5# 2x12  Step onto air-ex fwd and lateral approx 2x10  Prone planks 3x30 sec Side planks 2 x 30 sec      PATIENT EDUCATION:  Education details:  POC, rationale of exercises and exercise volume/intensity, rationale of interventions, relevant anatomy, sx response, and exercise form.  Person educated: Patient Education method: Explanation, Demonstration,  Tactile cues, Verbal cues, Education comprehension: verbalized understanding, returned demonstration, verbal cues required, tactile cues required, and needs further education  HOME EXERCISE PROGRAM: Access Code: URL: [email or web address]   3-layer heel lift PRI Rt sidelying knee over knee (handout provided)   ASSESSMENT:  CLINICAL IMPRESSION: Pt performed LE strengthening and stability exercises well.  She is improving in LE and core strength as evidenced by performance of exercises.  PT had pt perform birddogs on p-ball and pt was challenged having some difficulty with control/balance.  PT progressed pt with performing split squats which she tolerated well though was fatigued.  She tolerated exercises well and reports no increased pain after Rx.  Pt should benefit from cont PT to address impairments and goals and to improve overall function.     OBJECTIVE IMPAIRMENTS: Abnormal gait, decreased activity tolerance, decreased endurance, decreased mobility, difficulty walking, decreased ROM, decreased strength, hypomobility, and pain.   ACTIVITY LIMITATIONS: lifting, bending, sitting, standing, squatting, stairs, transfers, and locomotion level  PARTICIPATION LIMITATIONS: cleaning, laundry, driving, shopping, and community activity  PERSONAL FACTORS: Time since onset of injury/illness/exacerbation and 3+ comorbidities: R hip labral repair, L ACL repair x 2, cervical fusion x 2 are also affecting patient's functional outcome.   REHAB POTENTIAL: Good  CLINICAL DECISION MAKING: Evolving/moderate complexity  EVALUATION COMPLEXITY: Moderate   GOALS:  SHORT TERM GOALS: Target date:  05/28/2023   Pt will be independent with HEP for improved pain, strength, tolerance to activity, and function.  Baseline: Goal status: met for short term  2.  Pt will tolerate aquatic therapy without adverse effects in order to perform exercises in a reduced stress environment and  improve functional mobility, tolerance to activity, strength, and pain.  Baseline:  Goal status: MET -05/24/23  Target date:  05/21/2023   3.  Pt's worst pain will be no > than 8/10 for improved tolerance to activity and mobility.  Baseline: 5/10 Goal status: MET - 05/24/23  4.  Pt will report at least a 25% improvement in her daily mobility.   Baseline:  Goal status: MET  5.  Pt will demonstrate improved quality of gait including improved Wb'ing thru R LE, reduced hip drop, and reduced limp.  Baseline:  Goal status: MET  6.  Pt will perform a 6 inch step up with good form and stability without significant pain Baseline:  Goal status: MET -05/24/23 Target date:  06/04/2023  7.  Pt will ambulate with a normalized heel to toe gait pattern without limping.   Goal status:  MET    LONG TERM GOALS: Target date: 08/20/2023   Pt will ambulate extended community distance without significant pain and difficulty.  Baseline:  Goal status: ONGOING  2.  Pt will ascend and descend stairs with a reciprocal gait with a rail.  Baseline:  Goal status: ONGOING  3.  Pt will be able to walk her neighborhood without significant pain. Baseline:  Goal status: In progress -06/13/23  4.  Pt will be able to perform her ADLs and IADLs including household chores without significant pain and difficulty.  Baseline:  Goal status: ONGOING  5.  Pt will squat with symmetrical Wb'ing in bilat LE's and good form without increased pain for improved functional strength.  Baseline:  Goal status: INITIAL  6.  Pt will demo 4 to 4+/5 strength in R hip abd, 4/5 in R hip flex, and 5/5 in R knee ext for improved performance of and tolerance with functional mobility.  Baseline:  Goal status: INITIAL   PLAN:  PT FREQUENCY:   2-3x/wk  PT DURATION: 9 weeks  PLANNED INTERVENTIONS: 97164- PT Re-evaluation, 97110-Therapeutic exercises, 97530- Therapeutic activity, 97112- Neuromuscular re-education, 97535- Self Care,  97140- Manual therapy, [phone]- Gait training, 60454- Aquatic Therapy, [phone]- Electrical stimulation (unattended), Patient/Family education, Balance training, Stair training, Taping, Dry Needling, Joint mobilization, Spinal mobilization, Scar mobilization, Cryotherapy, and Moist heat  PLAN FOR NEXT SESSION:  Cont with land based therapy.  Cont with DN and exercises per protocol per pt tolerance.    Signa Drier PT DPT 08/13/23 3:35 PM

## 2023-08-13 ENCOUNTER — Encounter (HOSPITAL_BASED_OUTPATIENT_CLINIC_OR_DEPARTMENT_OTHER): Payer: Self-pay | Admitting: Physical Therapy

## 2023-08-14 ENCOUNTER — Encounter (HOSPITAL_BASED_OUTPATIENT_CLINIC_OR_DEPARTMENT_OTHER): Payer: Self-pay | Admitting: Physical Therapy

## 2023-08-14 ENCOUNTER — Ambulatory Visit (HOSPITAL_BASED_OUTPATIENT_CLINIC_OR_DEPARTMENT_OTHER): Admitting: Physical Therapy

## 2023-08-14 DIAGNOSIS — M25651 Stiffness of right hip, not elsewhere classified: Secondary | ICD-10-CM

## 2023-08-14 DIAGNOSIS — R262 Difficulty in walking, not elsewhere classified: Secondary | ICD-10-CM

## 2023-08-14 DIAGNOSIS — M25551 Pain in right hip: Secondary | ICD-10-CM

## 2023-08-14 DIAGNOSIS — M6281 Muscle weakness (generalized): Secondary | ICD-10-CM

## 2023-08-14 NOTE — Therapy (Signed)
 OUTPATIENT PHYSICAL THERAPY LOWER EXTREMITY TREATMENT     Patient Name: [name] MRN: [id number] DOB:1985/12/22, 38 y.o., female Today's Date: 08/14/2023  END OF SESSION:  PT End of Session - 08/14/23 1522     Visit Number 34    Number of Visits 36    Date for PT Re-Evaluation 08/20/23    Authorization Type [employer]    PT Start Time 1515    PT Stop Time 1556    PT Time Calculation (min) 41 min    Activity Tolerance Patient tolerated treatment well    Behavior During Therapy WFL for tasks assessed/performed                            Past Medical History:  Diagnosis Date   ACL graft tear (HCC)    left knee   Allergy     Angio-edema    Anxiety    Complication of anesthesia    woke up during surgery   Depression    Family history of adverse reaction to anesthesia     mom is difficult to put to sleep   IUD (intrauterine device) in place    Knee pain, right    Migraine headache    Sleep paralysis    Ulcerative proctitis (HCC)    Past Surgical History:  Procedure Laterality Date   ANTERIOR CRUCIATE LIGAMENT REPAIR Left 08/10/2014   Procedure: ANTERIOR CRUCIATE LIGAMENT (ACL) REPAIR;  Surgeon: Genevie Kerns, MD;  Location: [facility] ;  Service: Orthopedics;  Laterality: Left;   HIP SURGERY Right 08/25/2019   PAO surgery [facility]   HIP SURGERY Bilateral 01/05/2021   [facility], had hardware taken out   KNEE ARTHROSCOPY Left 08/10/2014   Procedure: ARTHROSCOPY KNEE DEBRIDEMENT ALLOGRAFT ACL REVISION RECONSTRUCTION;  Surgeon: Genevie Kerns, MD;  Location: [facility];  Service: Orthopedics;  Laterality: Left;   KNEE ARTHROSCOPY W/ ACL RECONSTRUCTION Left 2006   SPINAL FUSION     widom tooth extraction     only had 1 wisdom tooth    Patient Active Problem List   Diagnosis Date Noted   Ulcerative proctitis with rectal bleeding (HCC) 06/16/2020   Bloating 06/16/2020   Chronic migraine without aura without status  migrainosus, not intractable 08/19/2018   Migraine with aura and with status migrainosus, not intractable 08/19/2018   Pain in left shoulder 08/19/2018   Pain in left ankle and joints of left foot 05/09/2018   Pain in left hip 05/09/2018   Cervicalgia 05/09/2018   Chronic rhinitis 11/12/2017   Angioedema 11/12/2017   Allergic reaction 11/12/2017   Chronic sinusitis 11/12/2017   Cervical intraepithelial neoplasia grade 1 10/01/2017   GAD (generalized anxiety disorder) 09/17/2014   ACL graft tear (HCC) 08/10/2014   S/P ACL reconstruction 08/10/2014   Migraine headache 05/15/2010     REFERRING PROVIDER: Allan Ishihara, MD  REFERRING DIAG: [phone] (ICD-10-CM) - Status post hip surgery / s/p arthroscopy of hip  S32.691K (ICD-10-CM) - Other specified fracture of right ischium, subsequent encounter for fracture with nonunion  THERAPY DIAG:  Pain in right hip  Muscle weakness (generalized)  Difficulty in walking, not elsewhere classified  Stiffness of right hip, not elsewhere classified  Rationale for Evaluation and Treatment: Rehabilitation  ONSET DATE: DOS  12/27/2022   SUBJECTIVE:   SUBJECTIVE STATEMENT: The patient reports anterior hip pain and hamstring pian. It has otherwise been doing well.       PERTINENT  HISTORY: Hx of hip surgeries: -Revision R hip femoroplasty, arthroscopy with labral repair, open internal fixation post pelv ring Fx on 12/27/2022 -R hip arthroscopy with femoroplasty, labral repair, and pelvis osteotomy on 08/24/2019 (PAO)  -L hip labral repair and hip bone osteotomy, femoroplasty on 03/21/20 -hardware removal on 01/05/21 in bilat hips     L ACL repair x 2 in 06 and 2016 GAD Migraines Ulcerative colitis and celiac Mast cell activation syndrome Cervical fusion in 2020 and 2021   PAIN: Are you having any pain?  Yes  NPRS: 4/10 current, 8/10 worst, 4/10 best ( no change ) Location:  R anterior hip, R glute/piriformis, HS, PSIS  area Type:  constant  PRECAUTIONS: Other: per surgical protocol, R hip labral repair, L ACL repair x 2, cervical fusion x 2   WEIGHT BEARING RESTRICTIONS: none indicated on orders  FALLS:  Has patient fallen in last 6 months? No  LIVING ENVIRONMENT: Lives with: lives with their spouse Lives in:  2 story home but moving into a 1 story Stairs: yes   PLOF: Independent  PATIENT GOALS: to be pain-free, improve ROM, improve quality of life    OBJECTIVE:  Note: Objective measures were completed at Evaluation unless otherwise noted.  DIAGNOSTIC FINDINGS: Pelvis x ray on 04/05/23:   Findings/Impression:  Hardware: Right acetabular plate and screw fixation without complication. Alignment: No dislocation. Bones: No acute fracture. Healing osteotomy of the right superior pubic ramus root. Joints: Mild degenerative changes of the left hip. Soft Tissues: Unremarkable.    PATIENT SURVEYS:  LEFS Initial/current:  41/80 / 35/80 LEFS 6/9: 63/80  COGNITION: Overall cognitive status: Within functional limits for tasks assessed       LOWER EXTREMITY ROM:  Active ROM Right eval Left eval Right 4/22  Hip flexion 105 118 104  Hip extension     Hip abduction 22 30 28   Hip adduction     Hip internal rotation 42 24   Hip external rotation 24 40 32  Knee flexion     Knee extension     Ankle dorsiflexion     Ankle plantarflexion     Ankle inversion     Ankle eversion      (Blank rows = not tested)  LOWER EXTREMITY HHD:  Hip abd (seated):  R:  11.3, L:  16.9 LOWER EXTREMITY MMT:    MMT Right 5/21 Left 5/21  Hip flexion 11.6 18.6  Hip extension    Hip abduction 18.9 21.4  Hip adduction    Hip internal rotation    Hip external rotation    Knee flexion    Knee extension 23.8 32.6  Ankle dorsiflexion    Ankle plantarflexion    Ankle inversion    Ankle eversion     (Blank rows = not tested)   GAIT: Assistive device utilized: None Level of assistance: Complete  Independence Comments:  Improved quality of gait with reduced limp though still favors R LE with gait.  Appears to have a R hip drop.  Decreased hip extension.  7/10 pain with ambulation.  TREATMENT DATE:   6/18 Manual: Trigger point release to lower lumbar spine, gluteals, and IT band. LAD with grade 2 and 3 oscillations to right lower extremity  Neuro reeducation: Dead lift from 4 inch lift 26 pounds 3 x 12 RPE of 6 Deep squat 3 x 10 using TRX for control Goblet squat in mirror 3x10 15 lbs     6/16 Elliptical lvl 0-1 x 6 mins Qped birddogs x 10 Birddogs on p-ball x10 Retro Walks/Monster Walks with RTB 3x10 Squats with 8# 2x10 Split squat with UE support x 10 reps Dead lift from 6 inch lift 26 pounds 2 x 12  Prone planks 3x30 sec Side planks x 30sec each    6/12 Manual: Trigger point release to lower lumbar spine, gluteals, and IT band. LAD with grade 2 and 3 oscillations to right lower extremity  Neuro-re-ed:  Cable walks fwd and back 15 lbs x10 each  Air-ex hop and hold x20 fwd and lateral  Heel/toe rocks with hold at end 2x15   Neuro reeducation: Dead lift from 4 inch lift 26 pounds 3 x 12 RPE of 6 Deep squat 3 x 10 using TRX for control  6/9: IASTM Rt HS Qped- knee flexion, hip ext with knee flexed, knee ext, lower straight leg  Done bilaterally, demo in primal push up Wall bridges  +heel lift, + heel lift with single leg lower Straight leg lift/lower from wall Primal push up-down dog    6/5 Manual: Trigger point release to lower lumbar spine, gluteals, and IT band. LAD with grade 2 and 3 oscillations to right lower extremity  Neuro reeducation: Dead lift from 4 inch lift 26 pounds 3 x 12 RPE of 6 Deep squat 3 x 10 using TRX for control   6/2 Elliptical lvl 0 x 5 mins Lateral band walks with GTB around ankles 3x10 Retro  Walks/Monster Walks with RTB 3x10 Squats with 5# 2x12  Step onto air-ex fwd and lateral approx 2x10  Prone planks 3x30 sec Side planks 2 x 30 sec      PATIENT EDUCATION:  Education details:  POC, rationale of exercises and exercise volume/intensity, rationale of interventions, relevant anatomy, sx response, and exercise form.  Person educated: Patient Education method: Explanation, Demonstration, Tactile cues, Verbal cues, Education comprehension: verbalized understanding, returned demonstration, verbal cues required, tactile cues required, and needs further education  HOME EXERCISE PROGRAM: Access Code: URL: [email or web address]   3-layer heel lift PRI Rt sidelying knee over knee (handout provided)   ASSESSMENT:  CLINICAL IMPRESSION: The patient is progressing We continue to work on advancing her strengthening exercises. She has pain but nothing more significant then the pain she has in the first place. She continues to have trigger points in the anterior hip and the hamstring.  OBJECTIVE IMPAIRMENTS: Abnormal gait, decreased activity tolerance, decreased endurance, decreased mobility, difficulty walking, decreased ROM, decreased strength, hypomobility, and pain.   ACTIVITY LIMITATIONS: lifting, bending, sitting, standing, squatting, stairs, transfers, and locomotion level  PARTICIPATION LIMITATIONS: cleaning, laundry, driving, shopping, and community activity  PERSONAL FACTORS: Time since onset of injury/illness/exacerbation and 3+ comorbidities: R hip labral repair, L ACL repair x 2, cervical fusion x 2 are also affecting patient's functional outcome.   REHAB POTENTIAL: Good  CLINICAL DECISION MAKING: Evolving/moderate complexity  EVALUATION COMPLEXITY: Moderate   GOALS:  SHORT TERM GOALS: Target date:  05/28/2023   Pt will be independent with HEP for improved pain, strength, tolerance to activity, and function.  Baseline: Goal status: met  for short term  2.  Pt will tolerate aquatic therapy without adverse effects in order to perform exercises in a reduced stress environment and improve functional mobility, tolerance to activity, strength, and pain.  Baseline:  Goal status: MET -05/24/23 Target date:  05/21/2023   3.  Pt's worst pain will be no > than 8/10 for improved tolerance to activity and mobility.  Baseline: 5/10 Goal status: MET - 05/24/23  4.  Pt will report at least a 25% improvement in her daily mobility.   Baseline:  Goal status: MET  5.  Pt will demonstrate improved quality of gait including improved Wb'ing thru R LE, reduced hip drop, and reduced limp.  Baseline:  Goal status: MET  6.  Pt will perform a 6 inch step up with good form and stability without significant pain Baseline:  Goal status: MET -05/24/23 Target date:  06/04/2023  7.  Pt will ambulate with a normalized heel to toe gait pattern without limping.   Goal status:  MET    LONG TERM GOALS: Target date: 08/20/2023   Pt will ambulate extended community distance without significant pain and difficulty.  Baseline:  Goal status: ONGOING 6/18   2.  Pt will ascend and descend stairs with a reciprocal gait with a rail.  Baseline:  Goal status: working on stairs 6/18  3.  Pt will be able to walk her neighborhood without significant pain. Baseline:  Goal status: In progress -06/13/23  4.  Pt will be able to perform her ADLs and IADLs including household chores without significant pain and difficulty.  Baseline:  Goal status: ONGOING  5.  Pt will squat with symmetrical Wb'ing in bilat LE's and good form without increased pain for improved functional strength.  Baseline:  Goal status: INITIAL  6.  Pt will demo 4 to 4+/5 strength in R hip abd, 4/5 in R hip flex, and 5/5 in R knee ext for improved performance of and tolerance with functional mobility.  Baseline:  Goal status: INITIAL   PLAN:  PT FREQUENCY:   2-3x/wk  PT DURATION: 9  weeks  PLANNED INTERVENTIONS: 97164- PT Re-evaluation, 97110-Therapeutic exercises, 97530- Therapeutic activity, 97112- Neuromuscular re-education, 97535- Self Care, 93810- Manual therapy, [phone]- Gait training, [phone]- Aquatic Therapy, [phone]- Electrical stimulation (unattended), Patient/Family education, Balance training, Stair training, Taping, Dry Needling, Joint mobilization, Spinal mobilization, Scar mobilization, Cryotherapy, and Moist heat  PLAN FOR NEXT SESSION:  Cont with land based therapy.  Cont with DN and exercises per protocol per pt tolerance.    Signa Drier PT DPT 08/14/23 3:23 PM

## 2023-08-15 ENCOUNTER — Encounter (HOSPITAL_BASED_OUTPATIENT_CLINIC_OR_DEPARTMENT_OTHER): Admitting: Physical Therapy

## 2023-08-19 ENCOUNTER — Ambulatory Visit (HOSPITAL_BASED_OUTPATIENT_CLINIC_OR_DEPARTMENT_OTHER): Admitting: Physical Therapy

## 2023-08-21 ENCOUNTER — Ambulatory Visit (HOSPITAL_BASED_OUTPATIENT_CLINIC_OR_DEPARTMENT_OTHER): Admitting: Physical Therapy

## 2023-08-21 ENCOUNTER — Encounter (HOSPITAL_BASED_OUTPATIENT_CLINIC_OR_DEPARTMENT_OTHER): Payer: Self-pay | Admitting: Physical Therapy

## 2023-08-21 DIAGNOSIS — M25551 Pain in right hip: Secondary | ICD-10-CM | POA: Diagnosis not present

## 2023-08-21 DIAGNOSIS — M6281 Muscle weakness (generalized): Secondary | ICD-10-CM

## 2023-08-21 DIAGNOSIS — M25651 Stiffness of right hip, not elsewhere classified: Secondary | ICD-10-CM

## 2023-08-21 DIAGNOSIS — R262 Difficulty in walking, not elsewhere classified: Secondary | ICD-10-CM

## 2023-08-21 NOTE — Therapy (Unsigned)
 OUTPATIENT PHYSICAL THERAPY LOWER EXTREMITY TREATMENT     Patient Name: Ifeoma Vallin MRN: [id number] DOB:04-Dec-1985, 38 y.o., female Today's Date: 08/22/2023  END OF SESSION:  PT End of Session - 08/21/23 1443     Visit Number 35    Number of Visits 51    Date for PT Re-Evaluation 10/17/23    Authorization Type [employer]    PT Start Time 1430    PT Stop Time 1512    PT Time Calculation (min) 42 min    Activity Tolerance Patient tolerated treatment well    Behavior During Therapy WFL for tasks assessed/performed                            Past Medical History:  Diagnosis Date   ACL graft tear (HCC)    left knee   Allergy     Angio-edema    Anxiety    Complication of anesthesia    woke up during surgery   Depression    Family history of adverse reaction to anesthesia     mom is difficult to put to sleep   IUD (intrauterine device) in place    Knee pain, right    Migraine headache    Sleep paralysis    Ulcerative proctitis (HCC)    Past Surgical History:  Procedure Laterality Date   ANTERIOR CRUCIATE LIGAMENT REPAIR Left 08/10/2014   Procedure: ANTERIOR CRUCIATE LIGAMENT (ACL) REPAIR;  Surgeon: Lamar Collet, MD;  Location: [facility] [place];  Service: Orthopedics;  Laterality: Left;   HIP SURGERY Right 08/25/2019   PAO surgery [facility] Redell kerns   HIP SURGERY Bilateral 01/05/2021   [facility], had hardware taken out   KNEE ARTHROSCOPY Left 08/10/2014   Procedure: ARTHROSCOPY KNEE DEBRIDEMENT ALLOGRAFT ACL REVISION RECONSTRUCTION;  Surgeon: Lamar Collet, MD;  Location: [facility];  Service: Orthopedics;  Laterality: Left;   KNEE ARTHROSCOPY W/ ACL RECONSTRUCTION Left 2006   SPINAL FUSION     widom tooth extraction     only had 1 wisdom tooth    Patient Active Problem List   Diagnosis Date Noted   Ulcerative proctitis with rectal bleeding (HCC) 06/16/2020   Bloating 06/16/2020   Chronic migraine without aura without status  migrainosus, not intractable 08/19/2018   Migraine with aura and with status migrainosus, not intractable 08/19/2018   Pain in left shoulder 08/19/2018   Pain in left ankle and joints of left foot 05/09/2018   Pain in left hip 05/09/2018   Cervicalgia 05/09/2018   Chronic rhinitis 11/12/2017   Angioedema 11/12/2017   Allergic reaction 11/12/2017   Chronic sinusitis 11/12/2017   Cervical intraepithelial neoplasia grade 1 10/01/2017   GAD (generalized anxiety disorder) 09/17/2014   ACL graft tear (HCC) 08/10/2014   S/P ACL reconstruction 08/10/2014   Migraine headache 05/15/2010     REFERRING PROVIDER: kerns Redell Lenis, MD  REFERRING DIAG: [id number] (ICD-10-CM) - Status post hip surgery / s/p arthroscopy of hip  S32.691K (ICD-10-CM) - Other specified fracture of right ischium, subsequent encounter for fracture with nonunion  THERAPY DIAG:  Pain in right hip  Muscle weakness (generalized)  Difficulty in walking, not elsewhere classified  Stiffness of right hip, not elsewhere classified  Rationale for Evaluation and Treatment: Rehabilitation  ONSET DATE: DOS  12/27/2022   SUBJECTIVE:   SUBJECTIVE STATEMENT: Patient came in today with significant pain.  She reports has had this pain over the past week.  She cannot tolerate  any significant incident onset of pain.  Patient suffers from endometriosis.  She feels like her pain may be tied to her menstrual cycle.      PERTINENT HISTORY: Hx of hip surgeries: -Revision R hip femoroplasty, arthroscopy with labral repair, open internal fixation post pelv ring Fx on 12/27/2022 -R hip arthroscopy with femoroplasty, labral repair, and pelvis osteotomy on 08/24/2019 (PAO)  -L hip labral repair and hip bone osteotomy, femoroplasty on 03/21/20 -hardware removal on 01/05/21 in bilat hips     L ACL repair x 2 in 06 and 2016 GAD Migraines Ulcerative colitis and celiac Mast cell activation syndrome Cervical fusion in 2020 and  2021   PAIN: Are you having any pain?  Yes  NPRS: 4/10 current, 8/10 worst, 4/10 best ( no change ) Location:  R anterior hip, R glute/piriformis, HS, PSIS area Type:  constant  PRECAUTIONS: Other: per surgical protocol, R hip labral repair, L ACL repair x 2, cervical fusion x 2   WEIGHT BEARING RESTRICTIONS: none indicated on orders  FALLS:  Has patient fallen in last 6 months? No  LIVING ENVIRONMENT: Lives with: lives with their spouse Lives in:  2 story home but moving into a 1 story Stairs: yes   PLOF: Independent  PATIENT GOALS: to be pain-free, improve ROM, improve quality of life    OBJECTIVE:  Note: Objective measures were completed at Evaluation unless otherwise noted.  DIAGNOSTIC FINDINGS: Pelvis x ray on 04/05/23:   Findings/Impression:  Hardware: Right acetabular plate and screw fixation without complication. Alignment: No dislocation. Bones: No acute fracture. Healing osteotomy of the right superior pubic ramus root. Joints: Mild degenerative changes of the left hip. Soft Tissues: Unremarkable.    PATIENT SURVEYS:  LEFS Initial/current:  41/80 / 35/80 LEFS 6/9: 63/80  COGNITION: Overall cognitive status: Within functional limits for tasks assessed       LOWER EXTREMITY ROM:  Active ROM Right eval Left eval Right 4/22  Hip flexion 105 118 104  Hip extension     Hip abduction 22 30 28   Hip adduction     Hip internal rotation 42 24   Hip external rotation 24 40 32  Knee flexion     Knee extension     Ankle dorsiflexion     Ankle plantarflexion     Ankle inversion     Ankle eversion      (Blank rows = not tested)  LOWER EXTREMITY HHD:  Hip abd (seated):  R:  11.3, L:  16.9 LOWER EXTREMITY MMT:    MMT Right 5/21 Left 5/21 Right  left  Hip flexion 11.6 18.6 22.3 29.6  Hip extension      Hip abduction 18.9 21.4 20.5 26.6  Hip adduction      Hip internal rotation      Hip external rotation      Knee flexion      Knee extension  23.8 32.6 36.3 44.3  Ankle dorsiflexion      Ankle plantarflexion      Ankle inversion      Ankle eversion       (Blank rows = not tested)   GAIT: Assistive device utilized: None Level of assistance: Complete Independence Comments:  Improved quality of gait with reduced limp though still favors R LE with gait.  Appears to have a R hip drop.  Decreased hip extension.  7/10 pain with ambulation.  TREATMENT DATE:   6/25 Manual: Trigger point release to lower lumbar spine, gluteals, and IT band. LAD with grade 2 and 3 oscillations to right lower extremity Grade III Posterior glides   TherEX: Assess strength Assess range of motion Reviewed goals pertaining to strength and range of motion.   6/18   Manual: Trigger point release to lower lumbar spine, gluteals, and IT band. LAD with grade 2 and 3 oscillations to right lower extremity  Neuro reeducation: Dead lift from 4 inch lift 26 pounds 3 x 12 RPE of 6 Deep squat 3 x 10 using TRX for control Goblet squat in mirror 3x10 15 lbs     6/16 Elliptical lvl 0-1 x 6 mins Qped birddogs x 10 Birddogs on p-ball x10 Retro Walks/Monster Walks with RTB 3x10 Squats with 8# 2x10 Split squat with UE support x 10 reps Dead lift from 6 inch lift 26 pounds 2 x 12  Prone planks 3x30 sec Side planks x 30sec each    6/12 Manual: Trigger point release to lower lumbar spine, gluteals, and IT band. LAD with grade 2 and 3 oscillations to right lower extremity  Neuro-re-ed:  Cable walks fwd and back 15 lbs x10 each  Air-ex hop and hold x20 fwd and lateral  Heel/toe rocks with hold at end 2x15   Neuro reeducation: Dead lift from 4 inch lift 26 pounds 3 x 12 RPE of 6 Deep squat 3 x 10 using TRX for control  6/9: IASTM Rt HS Qped- knee flexion, hip ext with knee flexed, knee ext, lower straight leg  Done  bilaterally, demo in primal push up Wall bridges  +heel lift, + heel lift with single leg lower Straight leg lift/lower from wall Primal push up-down dog    6/5 Manual: Trigger point release to lower lumbar spine, gluteals, and IT band. LAD with grade 2 and 3 oscillations to right lower extremity  Neuro reeducation: Dead lift from 4 inch lift 26 pounds 3 x 12 RPE of 6 Deep squat 3 x 10 using TRX for control   6/2 Elliptical lvl 0 x 5 mins Lateral band walks with GTB around ankles 3x10 Retro Walks/Monster Walks with RTB 3x10 Squats with 5# 2x12  Step onto air-ex fwd and lateral approx 2x10  Prone planks 3x30 sec Side planks 2 x 30 sec      PATIENT EDUCATION:  Education details:  POC, rationale of exercises and exercise volume/intensity, rationale of interventions, relevant anatomy, sx response, and exercise form.  Person educated: Patient Education method: Explanation, Demonstration, Tactile cues, Verbal cues, Education comprehension: verbalized understanding, returned demonstration, verbal cues required, tactile cues required, and needs further education  HOME EXERCISE PROGRAM: Access Code: URL: [email or web address]   3-layer heel lift PRI Rt sidelying knee over knee (handout provided)   ASSESSMENT:  CLINICAL IMPRESSION: The patient was limited today by significant pain. She had significant spasming in her gluteals and lower back. She had improved sharpness in her pain with manual therapy. She was advised to progress back into exercises as tolerated. We performed a progress note on her today. Despite high levels of pain her strength has improved. She is walking more and performing more exercises without pain overall. She is not sure the cause of this latest flair. She was advised to contact her MD if this high level of pain persists. The patient continues to have pain with end range motion.    OBJECTIVE IMPAIRMENTS: Abnormal gait, decreased  activity tolerance, decreased endurance, decreased mobility, difficulty  walking, decreased ROM, decreased strength, hypomobility, and pain.   ACTIVITY LIMITATIONS: lifting, bending, sitting, standing, squatting, stairs, transfers, and locomotion level  PARTICIPATION LIMITATIONS: cleaning, laundry, driving, shopping, and community activity  PERSONAL FACTORS: Time since onset of injury/illness/exacerbation and 3+ comorbidities: R hip labral repair, L ACL repair x 2, cervical fusion x 2 are also affecting patient's functional outcome.   REHAB POTENTIAL: Good  CLINICAL DECISION MAKING: Evolving/moderate complexity  EVALUATION COMPLEXITY: Moderate   GOALS:  SHORT TERM GOALS: Target date:  05/28/2023   Pt will be independent with HEP for improved pain, strength, tolerance to activity, and function.  Baseline: Goal status: met for short term  2.  Pt will tolerate aquatic therapy without adverse effects in order to perform exercises in a reduced stress environment and improve functional mobility, tolerance to activity, strength, and pain.  Baseline:  Goal status: MET 6/26 Target date:  05/21/2023   3.  Pt's worst pain will be no > than 8/10 for improved tolerance to activity and mobility.  Baseline: 5/10 Goal status: high levels of pain today 6/26  4.  Pt will report at least a 25% improvement in her daily mobility.   Baseline:  Goal status: MET is doing more overall 6/26  5.  Pt will demonstrate improved quality of gait including improved Wb'ing thru R LE, reduced hip drop, and reduced limp.  Baseline:  Goal status: MET  6.  Pt will perform a 6 inch step up with good form and stability without significant pain Baseline:  Goal status: MET -05/24/23 Target date:  06/04/2023  7.  Pt will ambulate with a normalized heel to toe gait pattern without limping.   Goal status:  MET    LONG TERM GOALS: Target date: 08/20/2023   Pt will ambulate extended community distance without  significant pain and difficulty.  Baseline:  Goal status: ONGOING 6/26  2.  Pt will ascend and descend stairs with a reciprocal gait with a rail.  Baseline:  Goal status: working on stairs 6/25 3.  Pt will be able to walk her neighborhood without significant pain. Baseline:  Goal status: In progress has started walking but still painful 6/25  4.  Pt will be able to perform her ADLs and IADLs including household chores without significant pain and difficulty.  Baseline:  Goal status: continued pain 6/25  5.  Pt will squat with symmetrical Wb'ing in bilat LE's and good form without increased pain for improved functional strength.  Baseline:  Goal status: INITIAL  6.  Pt will demo 4 to 4+/5 strength in R hip abd, 4/5 in R hip flex, and 5/5 in R knee ext for improved performance of and tolerance with functional mobility.  Baseline:  Goal status: INITIAL   PLAN:  PT FREQUENCY:   2-3x/wk  PT DURATION: 8 weeks  PLANNED INTERVENTIONS: 97164- PT Re-evaluation, 97110-Therapeutic exercises, 97530- Therapeutic activity, 97112- Neuromuscular re-education, 97535- Self Care, [id number]- Manual therapy, [phone]- Gait training, [phone]- Aquatic Therapy, [phone]- Electrical stimulation (unattended), Patient/Family education, Balance training, Stair training, Taping, Dry Needling, Joint mobilization, Spinal mobilization, Scar mobilization, Cryotherapy, and Moist heat  PLAN FOR NEXT SESSION:  Cont with land based therapy.  Cont with DN and exercises per protocol per pt tolerance.  Progress gym exercises as tolerated.   Alm Don PT DPT 08/22/23 9:15 PM

## 2023-08-22 ENCOUNTER — Encounter (HOSPITAL_BASED_OUTPATIENT_CLINIC_OR_DEPARTMENT_OTHER): Payer: Self-pay | Admitting: Physical Therapy

## 2023-08-23 ENCOUNTER — Encounter (HOSPITAL_BASED_OUTPATIENT_CLINIC_OR_DEPARTMENT_OTHER): Admitting: Physical Therapy

## 2023-08-26 ENCOUNTER — Ambulatory Visit (HOSPITAL_BASED_OUTPATIENT_CLINIC_OR_DEPARTMENT_OTHER): Admitting: Physical Therapy

## 2023-08-26 DIAGNOSIS — R262 Difficulty in walking, not elsewhere classified: Secondary | ICD-10-CM

## 2023-08-26 DIAGNOSIS — M25551 Pain in right hip: Secondary | ICD-10-CM

## 2023-08-26 DIAGNOSIS — M6281 Muscle weakness (generalized): Secondary | ICD-10-CM

## 2023-08-26 DIAGNOSIS — M25651 Stiffness of right hip, not elsewhere classified: Secondary | ICD-10-CM

## 2023-08-26 NOTE — Therapy (Signed)
 OUTPATIENT PHYSICAL THERAPY LOWER EXTREMITY TREATMENT     Patient Name: [name] MRN: [id number] DOB:1985/05/16, 38 y.o., female Today's Date: 08/27/2023  END OF SESSION:  PT End of Session - 08/26/23 1424     Visit Number 36    Number of Visits 51    Date for PT Re-Evaluation 10/17/23    Authorization Type [employer]    PT Start Time 1414    PT Stop Time 1449    PT Time Calculation (min) 35 min    Activity Tolerance Patient tolerated treatment well    Behavior During Therapy WFL for tasks assessed/performed                             Past Medical History:  Diagnosis Date   ACL graft tear (HCC)    left knee   Allergy     Angio-edema    Anxiety    Complication of anesthesia    woke up during surgery   Depression    Family history of adverse reaction to anesthesia     mom is difficult to put to sleep   IUD (intrauterine device) in place    Knee pain, right    Migraine headache    Sleep paralysis    Ulcerative proctitis (HCC)    Past Surgical History:  Procedure Laterality Date   ANTERIOR CRUCIATE LIGAMENT REPAIR Left 08/10/2014   Procedure: ANTERIOR CRUCIATE LIGAMENT (ACL) REPAIR;  Surgeon: Lamar Collet, MD;  Location: [facility];  Service: Orthopedics;  Laterality: Left;   HIP SURGERY Right 08/25/2019   PAO surgery [facility] Redell kerns   HIP SURGERY Bilateral 01/05/2021   [facility], had hardware taken out   KNEE ARTHROSCOPY Left 08/10/2014   Procedure: ARTHROSCOPY KNEE DEBRIDEMENT ALLOGRAFT ACL REVISION RECONSTRUCTION;  Surgeon: Lamar Collet, MD;  Location: [facility];  Service: Orthopedics;  Laterality: Left;   KNEE ARTHROSCOPY W/ ACL RECONSTRUCTION Left 2006   SPINAL FUSION     widom tooth extraction     only had 1 wisdom tooth    Patient Active Problem List   Diagnosis Date Noted   Ulcerative proctitis with rectal bleeding (HCC) 06/16/2020   Bloating 06/16/2020   Chronic migraine without aura without status  migrainosus, not intractable 08/19/2018   Migraine with aura and with status migrainosus, not intractable 08/19/2018   Pain in left shoulder 08/19/2018   Pain in left ankle and joints of left foot 05/09/2018   Pain in left hip 05/09/2018   Cervicalgia 05/09/2018   Chronic rhinitis 11/12/2017   Angioedema 11/12/2017   Allergic reaction 11/12/2017   Chronic sinusitis 11/12/2017   Cervical intraepithelial neoplasia grade 1 10/01/2017   GAD (generalized anxiety disorder) 09/17/2014   ACL graft tear (HCC) 08/10/2014   S/P ACL reconstruction 08/10/2014   Migraine headache 05/15/2010     REFERRING PROVIDER: kerns Redell Lenis, MD  REFERRING DIAG: [phone] (ICD-10-CM) - Status post hip surgery / s/p arthroscopy of hip  S32.691K (ICD-10-CM) - Other specified fracture of right ischium, subsequent encounter for fracture with nonunion  THERAPY DIAG:  Pain in right hip  Muscle weakness (generalized)  Difficulty in walking, not elsewhere classified  Stiffness of right hip, not elsewhere classified  Rationale for Evaluation and Treatment: Rehabilitation  ONSET DATE: DOS  12/27/2022   SUBJECTIVE:   SUBJECTIVE STATEMENT: Patient states she has been having some nerve pain in her mid shin.  Pt states her pain has been pretty  consistent for the past 3 weeks.  Pt states she is still having significant pain in the dimple area in her low back and in HS.  Pt states her anterior hip has been worse also.  Pt had to take an oxycodone  twice last week.  Pt denies any adverse effects after prior Rx.         PERTINENT HISTORY: Hx of hip surgeries: -Revision R hip femoroplasty, arthroscopy with labral repair, open internal fixation post pelv ring Fx on 12/27/2022 -R hip arthroscopy with femoroplasty, labral repair, and pelvis osteotomy on 08/24/2019 (PAO)  -L hip labral repair and hip bone osteotomy, femoroplasty on 03/21/20 -hardware removal on 01/05/21 in bilat hips     L ACL repair x 2 in 06  and 2016 GAD Migraines Ulcerative colitis and celiac Mast cell activation syndrome Cervical fusion in 2020 and 2021   PAIN: Are you having any pain?  Yes  NPRS: 6/10 current, 8/10 worst, 4/10 best  Location:  R anterior hip, R glute/piriformis, HS, PSIS area Type:  constant  PRECAUTIONS: Other: per surgical protocol, R hip labral repair, L ACL repair x 2, cervical fusion x 2   WEIGHT BEARING RESTRICTIONS: none indicated on orders  FALLS:  Has patient fallen in last 6 months? No  LIVING ENVIRONMENT: Lives with: lives with their spouse Lives in:  2 story home but moving into a 1 story Stairs: yes   PLOF: Independent  PATIENT GOALS: to be pain-free, improve ROM, improve quality of life    OBJECTIVE:  Note: Objective measures were completed at Evaluation unless otherwise noted.  DIAGNOSTIC FINDINGS: Pelvis x ray on 04/05/23:   Findings/Impression:  Hardware: Right acetabular plate and screw fixation without complication. Alignment: No dislocation. Bones: No acute fracture. Healing osteotomy of the right superior pubic ramus root. Joints: Mild degenerative changes of the left hip. Soft Tissues: Unremarkable.    PATIENT SURVEYS:  LEFS Initial/current:  41/80 / 35/80 LEFS 6/9: 63/80  COGNITION: Overall cognitive status: Within functional limits for tasks assessed       LOWER EXTREMITY ROM:  Active ROM Right eval Left eval Right 4/22  Hip flexion 105 118 104  Hip extension     Hip abduction 22 30 28   Hip adduction     Hip internal rotation 42 24   Hip external rotation 24 40 32  Knee flexion     Knee extension     Ankle dorsiflexion     Ankle plantarflexion     Ankle inversion     Ankle eversion      (Blank rows = not tested)  LOWER EXTREMITY HHD:  Hip abd (seated):  R:  11.3, L:  16.9 LOWER EXTREMITY MMT:    MMT Right 5/21 Left 5/21 Right  left  Hip flexion 11.6 18.6 22.3 29.6  Hip extension      Hip abduction 18.9 21.4 20.5 26.6  Hip  adduction      Hip internal rotation      Hip external rotation      Knee flexion      Knee extension 23.8 32.6 36.3 44.3  Ankle dorsiflexion      Ankle plantarflexion      Ankle inversion      Ankle eversion       (Blank rows = not tested)   GAIT: Assistive device utilized: None Level of assistance: Complete Independence Comments:  Improved quality of gait with reduced limp though still favors R LE with gait.  Appears to  have a R hip drop.  Decreased hip extension.  7/10 pain with ambulation.                                                                                                                                TREATMENT DATE:   6/30  Manual Therapy: Reviewed pt presentation and pain level.   STM to R sided lumbar paraspinals, R glute, and R HS in prone Rolling with the roller to R glute, HS, and anterior hip/proximal ant thigh in prone  6/25 Manual: Trigger point release to lower lumbar spine, gluteals, and IT band. LAD with grade 2 and 3 oscillations to right lower extremity Grade III Posterior glides   TherEX: Assess strength Assess range of motion Reviewed goals pertaining to strength and range of motion.   6/18   Manual: Trigger point release to lower lumbar spine, gluteals, and IT band. LAD with grade 2 and 3 oscillations to right lower extremity  Neuro reeducation: Dead lift from 4 inch lift 26 pounds 3 x 12 RPE of 6 Deep squat 3 x 10 using TRX for control Goblet squat in mirror 3x10 15 lbs     6/16 Elliptical lvl 0-1 x 6 mins Qped birddogs x 10 Birddogs on p-ball x10 Retro Walks/Monster Walks with RTB 3x10 Squats with 8# 2x10 Split squat with UE support x 10 reps Dead lift from 6 inch lift 26 pounds 2 x 12  Prone planks 3x30 sec Side planks x 30sec each    6/12 Manual: Trigger point release to lower lumbar spine, gluteals, and IT band. LAD with grade 2 and 3 oscillations to right lower extremity  Neuro-re-ed:  Cable walks fwd and  back 15 lbs x10 each  Air-ex hop and hold x20 fwd and lateral  Heel/toe rocks with hold at end 2x15   Neuro reeducation: Dead lift from 4 inch lift 26 pounds 3 x 12 RPE of 6 Deep squat 3 x 10 using TRX for control  6/9: IASTM Rt HS Qped- knee flexion, hip ext with knee flexed, knee ext, lower straight leg  Done bilaterally, demo in primal push up Wall bridges  +heel lift, + heel lift with single leg lower Straight leg lift/lower from wall Primal push up-down dog    6/5 Manual: Trigger point release to lower lumbar spine, gluteals, and IT band. LAD with grade 2 and 3 oscillations to right lower extremity  Neuro reeducation: Dead lift from 4 inch lift 26 pounds 3 x 12 RPE of 6 Deep squat 3 x 10 using TRX for control   6/2 Elliptical lvl 0 x 5 mins Lateral band walks with GTB around ankles 3x10 Retro Walks/Monster Walks with RTB 3x10 Squats with 5# 2x12  Step onto air-ex fwd and lateral approx 2x10  Prone planks 3x30 sec Side planks 2 x 30 sec      PATIENT EDUCATION:  Education details:  POC, rationale of exercises and exercise volume/intensity,  rationale of interventions, relevant anatomy, and sx response. Person educated: Patient Education method: Explanation, Demonstration, Tactile cues, Verbal cues, Education comprehension: verbalized understanding, returned demonstration, verbal cues required, tactile cues required, and needs further education  HOME EXERCISE PROGRAM: Access Code: URL: [email or web address]   3-layer heel lift PRI Rt sidelying knee over knee (handout provided)   ASSESSMENT:  CLINICAL IMPRESSION: Pt presents to Rx c/o'ing of pain in multiple areas including some nerve pain.  Pt states her pain has been pretty consistent for the past 3 weeks.  PT focused on soft tissue work today instead of exercises to improve soft tissue tightness and pain.  PT focused soft tissue work on lumbar paraspinals, glute, HS, and anterior  hip/proximal anterior thigh.  She reports no change in pain after treatment still having 6/10 after Rx.   OBJECTIVE IMPAIRMENTS: Abnormal gait, decreased activity tolerance, decreased endurance, decreased mobility, difficulty walking, decreased ROM, decreased strength, hypomobility, and pain.   ACTIVITY LIMITATIONS: lifting, bending, sitting, standing, squatting, stairs, transfers, and locomotion level  PARTICIPATION LIMITATIONS: cleaning, laundry, driving, shopping, and community activity  PERSONAL FACTORS: Time since onset of injury/illness/exacerbation and 3+ comorbidities: R hip labral repair, L ACL repair x 2, cervical fusion x 2 are also affecting patient's functional outcome.   REHAB POTENTIAL: Good  CLINICAL DECISION MAKING: Evolving/moderate complexity  EVALUATION COMPLEXITY: Moderate   GOALS:  SHORT TERM GOALS: Target date:  05/28/2023   Pt will be independent with HEP for improved pain, strength, tolerance to activity, and function.  Baseline: Goal status: met for short term  2.  Pt will tolerate aquatic therapy without adverse effects in order to perform exercises in a reduced stress environment and improve functional mobility, tolerance to activity, strength, and pain.  Baseline:  Goal status: MET 6/26 Target date:  05/21/2023   3.  Pt's worst pain will be no > than 8/10 for improved tolerance to activity and mobility.  Baseline: 5/10 Goal status: high levels of pain today 6/26  4.  Pt will report at least a 25% improvement in her daily mobility.   Baseline:  Goal status: MET is doing more overall 6/26  5.  Pt will demonstrate improved quality of gait including improved Wb'ing thru R LE, reduced hip drop, and reduced limp.  Baseline:  Goal status: MET  6.  Pt will perform a 6 inch step up with good form and stability without significant pain Baseline:  Goal status: MET -05/24/23 Target date:  06/04/2023  7.  Pt will ambulate with a normalized heel to toe gait  pattern without limping.   Goal status:  MET    LONG TERM GOALS: Target date: 08/20/2023   Pt will ambulate extended community distance without significant pain and difficulty.  Baseline:  Goal status: ONGOING 6/26  2.  Pt will ascend and descend stairs with a reciprocal gait with a rail.  Baseline:  Goal status: working on stairs 6/25 3.  Pt will be able to walk her neighborhood without significant pain. Baseline:  Goal status: In progress has started walking but still painful 6/25  4.  Pt will be able to perform her ADLs and IADLs including household chores without significant pain and difficulty.  Baseline:  Goal status: continued pain 6/25  5.  Pt will squat with symmetrical Wb'ing in bilat LE's and good form without increased pain for improved functional strength.  Baseline:  Goal status: INITIAL  6.  Pt will demo 4 to 4+/5 strength in R hip abd, 4/5 in  R hip flex, and 5/5 in R knee ext for improved performance of and tolerance with functional mobility.  Baseline:  Goal status: INITIAL   PLAN:  PT FREQUENCY:   2-3x/wk  PT DURATION: 8 weeks  PLANNED INTERVENTIONS: 97164- PT Re-evaluation, 97110-Therapeutic exercises, 97530- Therapeutic activity, 97112- Neuromuscular re-education, 97535- Self Care, [id number]- Manual therapy, [phone]- Gait training, [phone]- Aquatic Therapy, [phone]- Electrical stimulation (unattended), Patient/Family education, Balance training, Stair training, Taping, Dry Needling, Joint mobilization, Spinal mobilization, Scar mobilization, Cryotherapy, and Moist heat  PLAN FOR NEXT SESSION:  Cont with land based therapy.  Cont with DN and exercises per protocol per pt tolerance.  Progress gym exercises as tolerated.   Leigh Minerva III PT, DPT 08/27/23 1:18 PM

## 2023-08-27 ENCOUNTER — Encounter (HOSPITAL_BASED_OUTPATIENT_CLINIC_OR_DEPARTMENT_OTHER): Payer: Self-pay | Admitting: Physical Therapy

## 2023-08-28 ENCOUNTER — Encounter (HOSPITAL_BASED_OUTPATIENT_CLINIC_OR_DEPARTMENT_OTHER): Admitting: Physical Therapy

## 2023-08-29 ENCOUNTER — Ambulatory Visit (HOSPITAL_BASED_OUTPATIENT_CLINIC_OR_DEPARTMENT_OTHER): Attending: Orthopedic Surgery | Admitting: Physical Therapy

## 2023-08-29 DIAGNOSIS — M25551 Pain in right hip: Secondary | ICD-10-CM | POA: Diagnosis present

## 2023-08-29 DIAGNOSIS — R262 Difficulty in walking, not elsewhere classified: Secondary | ICD-10-CM | POA: Diagnosis present

## 2023-08-29 DIAGNOSIS — M6281 Muscle weakness (generalized): Secondary | ICD-10-CM | POA: Insufficient documentation

## 2023-08-29 DIAGNOSIS — M25651 Stiffness of right hip, not elsewhere classified: Secondary | ICD-10-CM | POA: Diagnosis present

## 2023-08-29 NOTE — Therapy (Signed)
 OUTPATIENT PHYSICAL THERAPY LOWER EXTREMITY TREATMENT     Patient Name: [name] MRN: [id number] DOB:1986-01-30, 38 y.o., female Today's Date: 08/30/2023  END OF SESSION:  PT End of Session - 08/29/23 1453     Visit Number 37    Number of Visits 51    Date for PT Re-Evaluation 10/17/23    Authorization Type [employer]    PT Start Time 1413    PT Stop Time 1456    PT Time Calculation (min) 43 min    Activity Tolerance Patient tolerated treatment well    Behavior During Therapy WFL for tasks assessed/performed                              Past Medical History:  Diagnosis Date   ACL graft tear (HCC)    left knee   Allergy     Angio-edema    Anxiety    Complication of anesthesia    woke up during surgery   Depression    Family history of adverse reaction to anesthesia     mom is difficult to put to sleep   IUD (intrauterine device) in place    Knee pain, right    Migraine headache    Sleep paralysis    Ulcerative proctitis (HCC)    Past Surgical History:  Procedure Laterality Date   ANTERIOR CRUCIATE LIGAMENT REPAIR Left 08/10/2014   Procedure: ANTERIOR CRUCIATE LIGAMENT (ACL) REPAIR;  Surgeon: Lamar Collet, MD;  Location: [facility] [place];  Service: Orthopedics;  Laterality: Left;   HIP SURGERY Right 08/25/2019   PAO surgery [facility] Redell kerns   HIP SURGERY Bilateral 01/05/2021   [facility], had hardware taken out   KNEE ARTHROSCOPY Left 08/10/2014   Procedure: ARTHROSCOPY KNEE DEBRIDEMENT ALLOGRAFT ACL REVISION RECONSTRUCTION;  Surgeon: Lamar Collet, MD;  Location: [facility];  Service: Orthopedics;  Laterality: Left;   KNEE ARTHROSCOPY W/ ACL RECONSTRUCTION Left 2006   SPINAL FUSION     widom tooth extraction     only had 1 wisdom tooth    Patient Active Problem List   Diagnosis Date Noted   Ulcerative proctitis with rectal bleeding (HCC) 06/16/2020   Bloating 06/16/2020   Chronic migraine without aura without status  migrainosus, not intractable 08/19/2018   Migraine with aura and with status migrainosus, not intractable 08/19/2018   Pain in left shoulder 08/19/2018   Pain in left ankle and joints of left foot 05/09/2018   Pain in left hip 05/09/2018   Cervicalgia 05/09/2018   Chronic rhinitis 11/12/2017   Angioedema 11/12/2017   Allergic reaction 11/12/2017   Chronic sinusitis 11/12/2017   Cervical intraepithelial neoplasia grade 1 10/01/2017   GAD (generalized anxiety disorder) 09/17/2014   ACL graft tear (HCC) 08/10/2014   S/P ACL reconstruction 08/10/2014   Migraine headache 05/15/2010     REFERRING PROVIDER: kerns Redell Lenis, MD  REFERRING DIAG: [phone] (ICD-10-CM) - Status post hip surgery / s/p arthroscopy of hip  S32.691K (ICD-10-CM) - Other specified fracture of right ischium, subsequent encounter for fracture with nonunion  THERAPY DIAG:  Pain in right hip  Muscle weakness (generalized)  Difficulty in walking, not elsewhere classified  Stiffness of right hip, not elsewhere classified  Rationale for Evaluation and Treatment: Rehabilitation  ONSET DATE: DOS  12/27/2022   SUBJECTIVE:   SUBJECTIVE STATEMENT: Patient states she did not feel any better after prior Rx.  She still is having a lot of pain  and feels pulling in her anterior hip.  Pt feels pulling in anterior hip with hip extension during gait.  Pt walked at least 1.5 miles yesterday and had no increased pain.  She still has some nerve pain in her mid shin.       PERTINENT HISTORY: Hx of hip surgeries: -Revision R hip femoroplasty, arthroscopy with labral repair, open internal fixation post pelv ring Fx on 12/27/2022 -R hip arthroscopy with femoroplasty, labral repair, and pelvis osteotomy on 08/24/2019 (PAO)  -L hip labral repair and hip bone osteotomy, femoroplasty on 03/21/20 -hardware removal on 01/05/21 in bilat hips     L ACL repair x 2 in 06 and 2016 GAD Migraines Ulcerative colitis and celiac Mast  cell activation syndrome Cervical fusion in 2020 and 2021   PAIN: Are you having any pain?  Yes  NPRS: 5/10 current, 8/10 worst, 4/10 best  Location:  R anterior hip, R glute/piriformis, HS, PSIS area Type:  constant  PRECAUTIONS: Other: per surgical protocol, R hip labral repair, L ACL repair x 2, cervical fusion x 2   WEIGHT BEARING RESTRICTIONS: none indicated on orders  FALLS:  Has patient fallen in last 6 months? No  LIVING ENVIRONMENT: Lives with: lives with their spouse Lives in:  2 story home but moving into a 1 story Stairs: yes   PLOF: Independent  PATIENT GOALS: to be pain-free, improve ROM, improve quality of life    OBJECTIVE:  Note: Objective measures were completed at Evaluation unless otherwise noted.  DIAGNOSTIC FINDINGS: Pelvis x ray on 04/05/23:   Findings/Impression:  Hardware: Right acetabular plate and screw fixation without complication. Alignment: No dislocation. Bones: No acute fracture. Healing osteotomy of the right superior pubic ramus root. Joints: Mild degenerative changes of the left hip. Soft Tissues: Unremarkable.    PATIENT SURVEYS:  LEFS Initial/current:  41/80 / 35/80 LEFS 6/9: 63/80  COGNITION: Overall cognitive status: Within functional limits for tasks assessed       LOWER EXTREMITY ROM:  Active ROM Right eval Left eval Right 4/22  Hip flexion 105 118 104  Hip extension     Hip abduction 22 30 28   Hip adduction     Hip internal rotation 42 24   Hip external rotation 24 40 32  Knee flexion     Knee extension     Ankle dorsiflexion     Ankle plantarflexion     Ankle inversion     Ankle eversion      (Blank rows = not tested)  LOWER EXTREMITY HHD:  Hip abd (seated):  R:  11.3, L:  16.9 LOWER EXTREMITY MMT:    MMT Right 5/21 Left 5/21 Right  left  Hip flexion 11.6 18.6 22.3 29.6  Hip extension      Hip abduction 18.9 21.4 20.5 26.6  Hip adduction      Hip internal rotation      Hip external rotation       Knee flexion      Knee extension 23.8 32.6 36.3 44.3  Ankle dorsiflexion      Ankle plantarflexion      Ankle inversion      Ankle eversion       (Blank rows = not tested)   GAIT: Assistive device utilized: None Level of assistance: Complete Independence Comments:  Improved quality of gait with reduced limp though still favors R LE with gait.  Appears to have a R hip drop.  Decreased hip extension.  7/10 pain with ambulation.  TREATMENT DATE:   7/3 Elliptical lvl 1-2 x 5 mins Split squat with UE support 2 x 10 reps Retro Walks/Monster Walks with RTB 3x10 Lateral band walks with RTB/GTB at ankles x1/2 reps Squats 2x10 with 8# Birddogs on p-ball  Dead lift from 6 inch lift 26 pounds 2 x 12  Prone planks 2x30 sec    6/30  Manual Therapy: Reviewed pt presentation and pain level.   STM to R sided lumbar paraspinals, R glute, and R HS in prone Rolling with the roller to R glute, HS, and anterior hip/proximal ant thigh in prone  6/25 Manual: Trigger point release to lower lumbar spine, gluteals, and IT band. LAD with grade 2 and 3 oscillations to right lower extremity Grade III Posterior glides   TherEX: Assess strength Assess range of motion Reviewed goals pertaining to strength and range of motion.   6/18   Manual: Trigger point release to lower lumbar spine, gluteals, and IT band. LAD with grade 2 and 3 oscillations to right lower extremity  Neuro reeducation: Dead lift from 4 inch lift 26 pounds 3 x 12 RPE of 6 Deep squat 3 x 10 using TRX for control Goblet squat in mirror 3x10 15 lbs     6/16 Elliptical lvl 0-1 x 6 mins Qped birddogs x 10 Birddogs on p-ball x10 Retro Walks/Monster Walks with RTB 3x10 Squats with 8# 2x10 Split squat with UE support x 10 reps Dead lift from 6 inch lift 26 pounds 2 x 12  Prone planks 3x30  sec Side planks x 30sec each    6/12 Manual: Trigger point release to lower lumbar spine, gluteals, and IT band. LAD with grade 2 and 3 oscillations to right lower extremity  Neuro-re-ed:  Cable walks fwd and back 15 lbs x10 each  Air-ex hop and hold x20 fwd and lateral  Heel/toe rocks with hold at end 2x15   Neuro reeducation: Dead lift from 4 inch lift 26 pounds 3 x 12 RPE of 6 Deep squat 3 x 10 using TRX for control  6/9: IASTM Rt HS Qped- knee flexion, hip ext with knee flexed, knee ext, lower straight leg  Done bilaterally, demo in primal push up Wall bridges  +heel lift, + heel lift with single leg lower Straight leg lift/lower from wall Primal push up-down dog    6/5 Manual: Trigger point release to lower lumbar spine, gluteals, and IT band. LAD with grade 2 and 3 oscillations to right lower extremity  Neuro reeducation: Dead lift from 4 inch lift 26 pounds 3 x 12 RPE of 6 Deep squat 3 x 10 using TRX for control   6/2 Elliptical lvl 0 x 5 mins Lateral band walks with GTB around ankles 3x10 Retro Walks/Monster Walks with RTB 3x10 Squats with 5# 2x12  Step onto air-ex fwd and lateral approx 2x10  Prone planks 3x30 sec Side planks 2 x 30 sec      PATIENT EDUCATION:  Education details:  POC, rationale of exercises and exercise volume/intensity, rationale of interventions, relevant anatomy, and sx response. Person educated: Patient Education method: Explanation, Demonstration, Tactile cues, Verbal cues, Education comprehension: verbalized understanding, returned demonstration, verbal cues required, tactile cues required, and needs further education  HOME EXERCISE PROGRAM: Access Code: URL: [email or web address]   3-layer heel lift PRI Rt sidelying knee over knee (handout provided)   ASSESSMENT:  CLINICAL IMPRESSION: Pt continues to report pain in the same areas.  She is feeling more of a pulling in her anterior  hip and  some nerve pain in her anterior shin.  Pt reports no improvement in pain after prior Rx focused on soft tissue work.  PT returned to performing hip, LE, and core strengthening today.  Pt gives great effort with all exercises and performed exercises well.  Pt tolerated treatment well reporting improved pain from 5/10 before Rx to 4/10 after Rx.     OBJECTIVE IMPAIRMENTS: Abnormal gait, decreased activity tolerance, decreased endurance, decreased mobility, difficulty walking, decreased ROM, decreased strength, hypomobility, and pain.   ACTIVITY LIMITATIONS: lifting, bending, sitting, standing, squatting, stairs, transfers, and locomotion level  PARTICIPATION LIMITATIONS: cleaning, laundry, driving, shopping, and community activity  PERSONAL FACTORS: Time since onset of injury/illness/exacerbation and 3+ comorbidities: R hip labral repair, L ACL repair x 2, cervical fusion x 2 are also affecting patient's functional outcome.   REHAB POTENTIAL: Good  CLINICAL DECISION MAKING: Evolving/moderate complexity  EVALUATION COMPLEXITY: Moderate   GOALS:  SHORT TERM GOALS: Target date:  05/28/2023   Pt will be independent with HEP for improved pain, strength, tolerance to activity, and function.  Baseline: Goal status: met for short term  2.  Pt will tolerate aquatic therapy without adverse effects in order to perform exercises in a reduced stress environment and improve functional mobility, tolerance to activity, strength, and pain.  Baseline:  Goal status: MET 6/26 Target date:  05/21/2023   3.  Pt's worst pain will be no > than 8/10 for improved tolerance to activity and mobility.  Baseline: 5/10 Goal status: high levels of pain today 6/26  4.  Pt will report at least a 25% improvement in her daily mobility.   Baseline:  Goal status: MET is doing more overall 6/26  5.  Pt will demonstrate improved quality of gait including improved Wb'ing thru R LE, reduced hip drop, and reduced limp.   Baseline:  Goal status: MET  6.  Pt will perform a 6 inch step up with good form and stability without significant pain Baseline:  Goal status: MET -05/24/23 Target date:  06/04/2023  7.  Pt will ambulate with a normalized heel to toe gait pattern without limping.   Goal status:  MET    LONG TERM GOALS: Target date: 08/20/2023   Pt will ambulate extended community distance without significant pain and difficulty.  Baseline:  Goal status: ONGOING 6/26  2.  Pt will ascend and descend stairs with a reciprocal gait with a rail.  Baseline:  Goal status: working on stairs 6/25 3.  Pt will be able to walk her neighborhood without significant pain. Baseline:  Goal status: In progress has started walking but still painful 6/25  4.  Pt will be able to perform her ADLs and IADLs including household chores without significant pain and difficulty.  Baseline:  Goal status: continued pain 6/25  5.  Pt will squat with symmetrical Wb'ing in bilat LE's and good form without increased pain for improved functional strength.  Baseline:  Goal status: INITIAL  6.  Pt will demo 4 to 4+/5 strength in R hip abd, 4/5 in R hip flex, and 5/5 in R knee ext for improved performance of and tolerance with functional mobility.  Baseline:  Goal status: INITIAL   PLAN:  PT FREQUENCY:   2-3x/wk  PT DURATION: 8 weeks  PLANNED INTERVENTIONS: 97164- PT Re-evaluation, 97110-Therapeutic exercises, 97530- Therapeutic activity, 97112- Neuromuscular re-education, 97535- Self Care, [id number]- Manual therapy, [phone]- Gait training, [phone]- Aquatic Therapy, [phone]- Electrical stimulation (unattended), Patient/Family education, Balance training, Stair training,  Taping, Dry Needling, Joint mobilization, Spinal mobilization, Scar mobilization, Cryotherapy, and Moist heat  PLAN FOR NEXT SESSION:  Cont with land based therapy focusing on LE and core strengthening.  Cont with DN and exercises per protocol per pt tolerance.      Leigh Minerva III PT, DPT 08/30/23 6:18 AM

## 2023-08-30 ENCOUNTER — Encounter (HOSPITAL_BASED_OUTPATIENT_CLINIC_OR_DEPARTMENT_OTHER): Payer: Self-pay | Admitting: Physical Therapy

## 2023-09-02 ENCOUNTER — Encounter (HOSPITAL_BASED_OUTPATIENT_CLINIC_OR_DEPARTMENT_OTHER): Payer: Self-pay | Admitting: Physical Therapy

## 2023-09-02 ENCOUNTER — Ambulatory Visit (HOSPITAL_BASED_OUTPATIENT_CLINIC_OR_DEPARTMENT_OTHER): Admitting: Physical Therapy

## 2023-09-02 DIAGNOSIS — M25551 Pain in right hip: Secondary | ICD-10-CM | POA: Diagnosis not present

## 2023-09-02 DIAGNOSIS — M25651 Stiffness of right hip, not elsewhere classified: Secondary | ICD-10-CM

## 2023-09-02 DIAGNOSIS — M6281 Muscle weakness (generalized): Secondary | ICD-10-CM

## 2023-09-02 DIAGNOSIS — R262 Difficulty in walking, not elsewhere classified: Secondary | ICD-10-CM

## 2023-09-02 NOTE — Therapy (Signed)
 OUTPATIENT PHYSICAL THERAPY LOWER EXTREMITY TREATMENT     Patient Name: [name] MRN: [id number] DOB:20-Dec-1985, 38 y.o., female Today's Date: 09/02/2023  END OF SESSION:  PT End of Session - 09/02/23 1322     Visit Number 38    Number of Visits 51    Date for PT Re-Evaluation 10/17/23    Authorization Type [employer]    PT Start Time 1320    PT Stop Time 1405    PT Time Calculation (min) 45 min    Activity Tolerance Patient tolerated treatment well    Behavior During Therapy WFL for tasks assessed/performed                              Past Medical History:  Diagnosis Date   ACL graft tear (HCC)    left knee   Allergy     Angio-edema    Anxiety    Complication of anesthesia    woke up during surgery   Depression    Family history of adverse reaction to anesthesia     mom is difficult to put to sleep   IUD (intrauterine device) in place    Knee pain, right    Migraine headache    Sleep paralysis    Ulcerative proctitis (HCC)    Past Surgical History:  Procedure Laterality Date   ANTERIOR CRUCIATE LIGAMENT REPAIR Left 08/10/2014   Procedure: ANTERIOR CRUCIATE LIGAMENT (ACL) REPAIR;  Surgeon: Lamar Collet, MD;  Location: [facility] [place];  Service: Orthopedics;  Laterality: Left;   HIP SURGERY Right 08/25/2019   PAO surgery [facility] Redell kerns   HIP SURGERY Bilateral 01/05/2021   [facility], had hardware taken out   KNEE ARTHROSCOPY Left 08/10/2014   Procedure: ARTHROSCOPY KNEE DEBRIDEMENT ALLOGRAFT ACL REVISION RECONSTRUCTION;  Surgeon: Lamar Collet, MD;  Location: [facility];  Service: Orthopedics;  Laterality: Left;   KNEE ARTHROSCOPY W/ ACL RECONSTRUCTION Left 2006   SPINAL FUSION     widom tooth extraction     only had 1 wisdom tooth    Patient Active Problem List   Diagnosis Date Noted   Ulcerative proctitis with rectal bleeding (HCC) 06/16/2020   Bloating 06/16/2020   Chronic migraine without aura without  status migrainosus, not intractable 08/19/2018   Migraine with aura and with status migrainosus, not intractable 08/19/2018   Pain in left shoulder 08/19/2018   Pain in left ankle and joints of left foot 05/09/2018   Pain in left hip 05/09/2018   Cervicalgia 05/09/2018   Chronic rhinitis 11/12/2017   Angioedema 11/12/2017   Allergic reaction 11/12/2017   Chronic sinusitis 11/12/2017   Cervical intraepithelial neoplasia grade 1 10/01/2017   GAD (generalized anxiety disorder) 09/17/2014   ACL graft tear (HCC) 08/10/2014   S/P ACL reconstruction 08/10/2014   Migraine headache 05/15/2010     REFERRING PROVIDER: kerns Redell Lenis, MD  REFERRING DIAG: [phone] (ICD-10-CM) - Status post hip surgery / s/p arthroscopy of hip  S32.691K (ICD-10-CM) - Other specified fracture of right ischium, subsequent encounter for fracture with nonunion  THERAPY DIAG:  Pain in right hip  Muscle weakness (generalized)  Difficulty in walking, not elsewhere classified  Stiffness of right hip, not elsewhere classified  Rationale for Evaluation and Treatment: Rehabilitation  ONSET DATE: DOS  12/27/2022   SUBJECTIVE:   SUBJECTIVE STATEMENT: Today and yesterday I was better tahn I have been in the last 3 weeks. Hard to differentiate between  endometriosis and hip pain- I am now back to just hip. The right SIJ dimple hurts- I feel these little knots that go up side of spine and down to buttock. Rt sit bone- but just a little above, is painful. I don't have to grab from medial to pull lateral to reach pain but now I can go straight in. If I sit over on right sit bone it huts down to hamstrings, it will go to back of knee. Still super tight in Rt hip flexors- just medial to incision- it starts there and skips to mid distal shin and foot.  I do feel like I am getting stronger around the hip.      PERTINENT HISTORY: Hx of hip surgeries: -Revision R hip femoroplasty, arthroscopy with labral repair, open  internal fixation post pelv ring Fx on 12/27/2022 -R hip arthroscopy with femoroplasty, labral repair, and pelvis osteotomy on 08/24/2019 (PAO)  -L hip labral repair and hip bone osteotomy, femoroplasty on 03/21/20 -hardware removal on 01/05/21 in bilat hips     L ACL repair x 2 in 06 and 2016 GAD Migraines Ulcerative colitis and celiac Mast cell activation syndrome Cervical fusion in 2020 and 2021   PAIN: Are you having any pain?  Yes  NPRS: 5/10 current, 8/10 worst, 4/10 best  Location:  R anterior hip, R glute/piriformis, HS, PSIS area Type:  constant  PRECAUTIONS: Other: per surgical protocol, R hip labral repair, L ACL repair x 2, cervical fusion x 2   WEIGHT BEARING RESTRICTIONS: none indicated on orders  FALLS:  Has patient fallen in last 6 months? No  LIVING ENVIRONMENT: Lives with: lives with their spouse Lives in:  2 story home but moving into a 1 story Stairs: yes   PLOF: Independent  PATIENT GOALS: to be pain-free, improve ROM, improve quality of life    OBJECTIVE:  Note: Objective measures were completed at Evaluation unless otherwise noted.  DIAGNOSTIC FINDINGS: Pelvis x ray on 04/05/23:   Findings/Impression:  Hardware: Right acetabular plate and screw fixation without complication. Alignment: No dislocation. Bones: No acute fracture. Healing osteotomy of the right superior pubic ramus root. Joints: Mild degenerative changes of the left hip. Soft Tissues: Unremarkable.    PATIENT SURVEYS:  LEFS Initial/current:  41/80 / 35/80 LEFS 6/9: 63/80  COGNITION: Overall cognitive status: Within functional limits for tasks assessed       LOWER EXTREMITY ROM:  Active ROM Right eval Left eval Right 4/22  Hip flexion 105 118 104  Hip extension     Hip abduction 22 30 28   Hip adduction     Hip internal rotation 42 24   Hip external rotation 24 40 32  Knee flexion     Knee extension     Ankle dorsiflexion     Ankle plantarflexion     Ankle  inversion     Ankle eversion      (Blank rows = not tested)  LOWER EXTREMITY HHD:  Hip abd (seated):  R:  11.3, L:  16.9 LOWER EXTREMITY MMT:    MMT Right 5/21 Left 5/21 Right  left  Hip flexion 11.6 18.6 22.3 29.6  Hip extension      Hip abduction 18.9 21.4 20.5 26.6  Hip adduction      Hip internal rotation      Hip external rotation      Knee flexion      Knee extension 23.8 32.6 36.3 44.3  Ankle dorsiflexion      Ankle  plantarflexion      Ankle inversion      Ankle eversion       (Blank rows = not tested)   GAIT: Assistive device utilized: None Level of assistance: Complete Independence Comments:  Improved quality of gait with reduced limp though still favors R LE with gait.  Appears to have a R hip drop.  Decreased hip extension.  7/10 pain with ambulation.                                                                                                                                TREATMENT DATE:   Treatment                            7/7: Blank lines following charge title = not provided on this treatment date.   Manual:  TPDN No IASTM & rolling to anterior and lateral incisions MET- Rt flexors/Lt extensors followed by abd/add Spring mob to encourage Lt post inom rotation There-ex:  There-Act:  Self Care: Anatomy of condition, recap of symptoms & changes,  looked at MRI from April Nuro-Re-ed:  Gait Training:    7/3 Elliptical lvl 1-2 x 5 mins Split squat with UE support 2 x 10 reps Retro Walks/Monster Walks with RTB 3x10 Lateral band walks with RTB/GTB at ankles x1/2 reps Squats 2x10 with 8# Birddogs on p-ball  Dead lift from 6 inch lift 26 pounds 2 x 12  Prone planks 2x30 sec    6/30  Manual Therapy: Reviewed pt presentation and pain level.   STM to R sided lumbar paraspinals, R glute, and R HS in prone Rolling with the roller to R glute, HS, and anterior hip/proximal ant thigh in prone  6/25 Manual: Trigger point release to lower  lumbar spine, gluteals, and IT band. LAD with grade 2 and 3 oscillations to right lower extremity Grade III Posterior glides   TherEX: Assess strength Assess range of motion Reviewed goals pertaining to strength and range of motion.   6/18   Manual: Trigger point release to lower lumbar spine, gluteals, and IT band. LAD with grade 2 and 3 oscillations to right lower extremity  Neuro reeducation: Dead lift from 4 inch lift 26 pounds 3 x 12 RPE of 6 Deep squat 3 x 10 using TRX for control Goblet squat in mirror 3x10 15 lbs     6/16 Elliptical lvl 0-1 x 6 mins Qped birddogs x 10 Birddogs on p-ball x10 Retro Walks/Monster Walks with RTB 3x10 Squats with 8# 2x10 Split squat with UE support x 10 reps Dead lift from 6 inch lift 26 pounds 2 x 12  Prone planks 3x30 sec Side planks x 30sec each    6/12 Manual: Trigger point release to lower lumbar spine, gluteals, and IT band. LAD with grade 2 and 3 oscillations to right lower extremity  Neuro-re-ed:  Cable walks fwd and back 15  lbs x10 each  Air-ex hop and hold x20 fwd and lateral  Heel/toe rocks with hold at end 2x15   Neuro reeducation: Dead lift from 4 inch lift 26 pounds 3 x 12 RPE of 6 Deep squat 3 x 10 using TRX for control  6/9: IASTM Rt HS Qped- knee flexion, hip ext with knee flexed, knee ext, lower straight leg  Done bilaterally, demo in primal push up Wall bridges  +heel lift, + heel lift with single leg lower Straight leg lift/lower from wall Primal push up-down dog    6/5 Manual: Trigger point release to lower lumbar spine, gluteals, and IT band. LAD with grade 2 and 3 oscillations to right lower extremity  Neuro reeducation: Dead lift from 4 inch lift 26 pounds 3 x 12 RPE of 6 Deep squat 3 x 10 using TRX for control   6/2 Elliptical lvl 0 x 5 mins Lateral band walks with GTB around ankles 3x10 Retro Walks/Monster Walks with RTB 3x10 Squats with 5# 2x12  Step onto air-ex fwd and  lateral approx 2x10  Prone planks 3x30 sec Side planks 2 x 30 sec      PATIENT EDUCATION:  Education details:  POC, rationale of exercises and exercise volume/intensity, rationale of interventions, relevant anatomy, and sx response. Person educated: Patient Education method: Explanation, Demonstration, Tactile cues, Verbal cues, Education comprehension: verbalized understanding, returned demonstration, verbal cues required, tactile cues required, and needs further education  HOME EXERCISE PROGRAM: Access Code: URL: [email or web address]   3-layer heel lift PRI Rt sidelying knee over knee (handout provided)   ASSESSMENT:  CLINICAL IMPRESSION: Able to improve scar mobility with manual therapy and encourage proper alignment at pelvis. Pain when sitting directly on Rt ischial tuberosity creating compensation in cross leg position- leaning to the left creating overuse of hip flexors. Pt reports surgeon told her the bright white around the femoral neck is bone graft healing- PT planning to reach out to surgeon for questions.   OBJECTIVE IMPAIRMENTS: Abnormal gait, decreased activity tolerance, decreased endurance, decreased mobility, difficulty walking, decreased ROM, decreased strength, hypomobility, and pain.   ACTIVITY LIMITATIONS: lifting, bending, sitting, standing, squatting, stairs, transfers, and locomotion level  PARTICIPATION LIMITATIONS: cleaning, laundry, driving, shopping, and community activity  PERSONAL FACTORS: Time since onset of injury/illness/exacerbation and 3+ comorbidities: R hip labral repair, L ACL repair x 2, cervical fusion x 2 are also affecting patient's functional outcome.   REHAB POTENTIAL: Good  CLINICAL DECISION MAKING: Evolving/moderate complexity  EVALUATION COMPLEXITY: Moderate   GOALS:  SHORT TERM GOALS: Target date:  05/28/2023   Pt will be independent with HEP for improved pain, strength, tolerance to activity, and  function.  Baseline: Goal status: met for short term  2.  Pt will tolerate aquatic therapy without adverse effects in order to perform exercises in a reduced stress environment and improve functional mobility, tolerance to activity, strength, and pain.  Baseline:  Goal status: MET 6/26 Target date:  05/21/2023   3.  Pt's worst pain will be no > than 8/10 for improved tolerance to activity and mobility.  Baseline: 5/10 Goal status: high levels of pain today 6/26  4.  Pt will report at least a 25% improvement in her daily mobility.   Baseline:  Goal status: MET is doing more overall 6/26  5.  Pt will demonstrate improved quality of gait including improved Wb'ing thru R LE, reduced hip drop, and reduced limp.  Baseline:  Goal status: MET  6.  Pt will perform  a 6 inch step up with good form and stability without significant pain Baseline:  Goal status: MET -05/24/23 Target date:  06/04/2023  7.  Pt will ambulate with a normalized heel to toe gait pattern without limping.   Goal status:  MET    LONG TERM GOALS: Target date: 08/20/2023   Pt will ambulate extended community distance without significant pain and difficulty.  Baseline:  Goal status: ONGOING 6/26  2.  Pt will ascend and descend stairs with a reciprocal gait with a rail.  Baseline:  Goal status: working on stairs 6/25 3.  Pt will be able to walk her neighborhood without significant pain. Baseline:  Goal status: In progress has started walking but still painful 6/25  4.  Pt will be able to perform her ADLs and IADLs including household chores without significant pain and difficulty.  Baseline:  Goal status: continued pain 6/25  5.  Pt will squat with symmetrical Wb'ing in bilat LE's and good form without increased pain for improved functional strength.  Baseline:  Goal status: INITIAL  6.  Pt will demo 4 to 4+/5 strength in R hip abd, 4/5 in R hip flex, and 5/5 in R knee ext for improved performance of and  tolerance with functional mobility.  Baseline:  Goal status: INITIAL   PLAN:  PT FREQUENCY:   2-3x/wk  PT DURATION: 8 weeks  PLANNED INTERVENTIONS: 97164- PT Re-evaluation, 97110-Therapeutic exercises, 97530- Therapeutic activity, 97112- Neuromuscular re-education, 97535- Self Care, [id number]- Manual therapy, [phone]- Gait training, [phone]- Aquatic Therapy, [phone]- Electrical stimulation (unattended), Patient/Family education, Balance training, Stair training, Taping, Dry Needling, Joint mobilization, Spinal mobilization, Scar mobilization, Cryotherapy, and Moist heat  PLAN FOR NEXT SESSION:  Cont with land based therapy focusing on LE and core strengthening.  Cont with DN and exercises per protocol per pt tolerance.     Sheneika Walstad C. Georgianna Band PT, DPT 09/02/23 4:53 PM

## 2023-09-04 ENCOUNTER — Ambulatory Visit (HOSPITAL_BASED_OUTPATIENT_CLINIC_OR_DEPARTMENT_OTHER): Admitting: Physical Therapy

## 2023-09-04 DIAGNOSIS — M25551 Pain in right hip: Secondary | ICD-10-CM | POA: Diagnosis not present

## 2023-09-04 DIAGNOSIS — R262 Difficulty in walking, not elsewhere classified: Secondary | ICD-10-CM

## 2023-09-04 DIAGNOSIS — M6281 Muscle weakness (generalized): Secondary | ICD-10-CM

## 2023-09-04 DIAGNOSIS — M25651 Stiffness of right hip, not elsewhere classified: Secondary | ICD-10-CM

## 2023-09-05 ENCOUNTER — Encounter (HOSPITAL_BASED_OUTPATIENT_CLINIC_OR_DEPARTMENT_OTHER): Payer: Self-pay | Admitting: Physical Therapy

## 2023-09-05 NOTE — Therapy (Signed)
 OUTPATIENT PHYSICAL THERAPY LOWER EXTREMITY TREATMENT     Patient Name: [name] MRN: [id number] DOB:03-15-1985, 38 y.o., female Today's Date: 09/05/2023  END OF SESSION:                        Past Medical History:  Diagnosis Date   ACL graft tear (HCC)    left knee   Allergy     Angio-edema    Anxiety    Complication of anesthesia    woke up during surgery   Depression    Family history of adverse reaction to anesthesia     mom is difficult to put to sleep   IUD (intrauterine device) in place    Knee pain, right    Migraine headache    Sleep paralysis    Ulcerative proctitis [facility])    Past Surgical History:  Procedure Laterality Date   ANTERIOR CRUCIATE LIGAMENT REPAIR Left 08/10/2014   Procedure: ANTERIOR CRUCIATE LIGAMENT (ACL) REPAIR;  Surgeon: Lamar Collet, MD;  Location: [facility];  Service: Orthopedics;  Laterality: Left;   HIP SURGERY Right 08/25/2019   PAO surgery [facility] Redell kerns   HIP SURGERY Bilateral 01/05/2021   [facility], had hardware taken out   KNEE ARTHROSCOPY Left 08/10/2014   Procedure: ARTHROSCOPY KNEE DEBRIDEMENT ALLOGRAFT ACL REVISION RECONSTRUCTION;  Surgeon: Lamar Collet, MD;  Location: [facility];  Service: Orthopedics;  Laterality: Left;   KNEE ARTHROSCOPY W/ ACL RECONSTRUCTION Left 2006   SPINAL FUSION     widom tooth extraction     only had 1 wisdom tooth    Patient Active Problem List   Diagnosis Date Noted   Ulcerative proctitis with rectal bleeding (HCC) 06/16/2020   Bloating 06/16/2020   Chronic migraine without aura without status migrainosus, not intractable 08/19/2018   Migraine with aura and with status migrainosus, not intractable 08/19/2018   Pain in left shoulder 08/19/2018   Pain in left ankle and joints of left foot 05/09/2018   Pain in left hip 05/09/2018   Cervicalgia 05/09/2018   Chronic rhinitis 11/12/2017   Angioedema 11/12/2017   Allergic reaction  11/12/2017   Chronic sinusitis 11/12/2017   Cervical intraepithelial neoplasia grade 1 10/01/2017   GAD (generalized anxiety disorder) 09/17/2014   ACL graft tear (HCC) 08/10/2014   S/P ACL reconstruction 08/10/2014   Migraine headache 05/15/2010     REFERRING PROVIDER: kerns Redell Lenis, MD  REFERRING DIAG: [phone] (ICD-10-CM) - Status post hip surgery / s/p arthroscopy of hip  S32.691K (ICD-10-CM) - Other specified fracture of right ischium, subsequent encounter for fracture with nonunion  THERAPY DIAG:  No diagnosis found.  Rationale for Evaluation and Treatment: Rehabilitation  ONSET DATE: DOS  12/27/2022   SUBJECTIVE:   SUBJECTIVE STATEMENT: Patient reports that the significant pain in her back has subsided somewhat.  She continues to have anterior hip pain and hamstring pain.  She continues to have difficulty sitting crosslegged.   PERTINENT HISTORY: Hx of hip surgeries: -Revision R hip femoroplasty, arthroscopy with labral repair, open internal fixation post pelv ring Fx on 12/27/2022 -R hip arthroscopy with femoroplasty, labral repair, and pelvis osteotomy on 08/24/2019 (PAO)  -L hip labral repair and hip bone osteotomy, femoroplasty on 03/21/20 -hardware removal on 01/05/21 in bilat hips     L ACL repair x 2 in 06 and 2016 GAD Migraines Ulcerative colitis and celiac Mast cell activation syndrome Cervical fusion in 2020 and 2021   PAIN: Are you having  any pain?  Yes  NPRS: 5/10 current, 8/10 worst, 4/10 best  Location:  R anterior hip, R glute/piriformis, HS, PSIS area Type:  constant  PRECAUTIONS: Other: per surgical protocol, R hip labral repair, L ACL repair x 2, cervical fusion x 2   WEIGHT BEARING RESTRICTIONS: none indicated on orders  FALLS:  Has patient fallen in last 6 months? No  LIVING ENVIRONMENT: Lives with: lives with their spouse Lives in:  2 story home but moving into a 1 story Stairs: yes   PLOF: Independent  PATIENT GOALS: to  be pain-free, improve ROM, improve quality of life    OBJECTIVE:  Note: Objective measures were completed at Evaluation unless otherwise noted.  DIAGNOSTIC FINDINGS: Pelvis x ray on 04/05/23:   Findings/Impression:  Hardware: Right acetabular plate and screw fixation without complication. Alignment: No dislocation. Bones: No acute fracture. Healing osteotomy of the right superior pubic ramus root. Joints: Mild degenerative changes of the left hip. Soft Tissues: Unremarkable.    PATIENT SURVEYS:  LEFS Initial/current:  41/80 / 35/80 LEFS 6/9: 63/80  COGNITION: Overall cognitive status: Within functional limits for tasks assessed       LOWER EXTREMITY ROM:  Active ROM Right eval Left eval Right 4/22  Hip flexion 105 118 104  Hip extension     Hip abduction 22 30 28   Hip adduction     Hip internal rotation 42 24   Hip external rotation 24 40 32  Knee flexion     Knee extension     Ankle dorsiflexion     Ankle plantarflexion     Ankle inversion     Ankle eversion      (Blank rows = not tested)  LOWER EXTREMITY HHD:  Hip abd (seated):  R:  11.3, L:  16.9 LOWER EXTREMITY MMT:    MMT Right 5/21 Left 5/21 Right  left  Hip flexion 11.6 18.6 22.3 29.6  Hip extension      Hip abduction 18.9 21.4 20.5 26.6  Hip adduction      Hip internal rotation      Hip external rotation      Knee flexion      Knee extension 23.8 32.6 36.3 44.3  Ankle dorsiflexion      Ankle plantarflexion      Ankle inversion      Ankle eversion       (Blank rows = not tested)   GAIT: Assistive device utilized: None Level of assistance: Complete Independence Comments:  Improved quality of gait with reduced limp though still favors R LE with gait.  Appears to have a R hip drop.  Decreased hip extension.  7/10 pain with ambulation.                                                                                                                                TREATMENT DATE:    7/10  Neuromuscular reeducation: Step on the Airex  forward and lateral 2 x 20 each Heel/toe rock on Airex 3 X15  Exercise ball 90/90 position Hamstring iso  3x10  Double knee to chest 3x10   There-act:  Step down eccentric 3x10 4 inch  Step and drive 6k89 6 inch  Lateral step and drive 6k89 6 inch   Treatment                            7/7: Blank lines following charge title = not provided on this treatment date.   Manual:  TPDN No IASTM & rolling to anterior and lateral incisions MET- Rt flexors/Lt extensors followed by abd/add Spring mob to encourage Lt post inom rotation There-ex:  There-Act:  Self Care: Anatomy of condition, recap of symptoms & changes,  looked at MRI from April Nuro-Re-ed:  Gait Training:    7/3 Elliptical lvl 1-2 x 5 mins Split squat with UE support 2 x 10 reps Retro Walks/Monster Walks with RTB 3x10 Lateral band walks with RTB/GTB at ankles x1/2 reps Squats 2x10 with 8# Birddogs on p-ball  Dead lift from 6 inch lift 26 pounds 2 x 12  Prone planks 2x30 sec    6/30  Manual Therapy: Reviewed pt presentation and pain level.   STM to R sided lumbar paraspinals, R glute, and R HS in prone Rolling with the roller to R glute, HS, and anterior hip/proximal ant thigh in prone  6/25 Manual: Trigger point release to lower lumbar spine, gluteals, and IT band. LAD with grade 2 and 3 oscillations to right lower extremity Grade III Posterior glides   TherEX: Assess strength Assess range of motion Reviewed goals pertaining to strength and range of motion.   6/18   Manual: Trigger point release to lower lumbar spine, gluteals, and IT band. LAD with grade 2 and 3 oscillations to right lower extremity  Neuro reeducation: Dead lift from 4 inch lift 26 pounds 3 x 12 RPE of 6 Deep squat 3 x 10 using TRX for control Goblet squat in mirror 3x10 15 lbs     6/16 Elliptical lvl 0-1 x 6 mins Qped birddogs x 10 Birddogs on p-ball  x10 Retro Walks/Monster Walks with RTB 3x10 Squats with 8# 2x10 Split squat with UE support x 10 reps Dead lift from 6 inch lift 26 pounds 2 x 12  Prone planks 3x30 sec Side planks x 30sec each    6/12 Manual: Trigger point release to lower lumbar spine, gluteals, and IT band. LAD with grade 2 and 3 oscillations to right lower extremity  Neuro-re-ed:  Cable walks fwd and back 15 lbs x10 each  Air-ex hop and hold x20 fwd and lateral  Heel/toe rocks with hold at end 2x15   Neuro reeducation: Dead lift from 4 inch lift 26 pounds 3 x 12 RPE of 6 Deep squat 3 x 10 using TRX for control  6/9: IASTM Rt HS Qped- knee flexion, hip ext with knee flexed, knee ext, lower straight leg  Done bilaterally, demo in primal push up Wall bridges  +heel lift, + heel lift with single leg lower Straight leg lift/lower from wall Primal push up-down dog    6/5 Manual: Trigger point release to lower lumbar spine, gluteals, and IT band. LAD with grade 2 and 3 oscillations to right lower extremity  Neuro reeducation: Dead lift from 4 inch lift 26 pounds 3 x 12 RPE of 6 Deep squat 3 x 10 using TRX for control  6/2 Elliptical lvl 0 x 5 mins Lateral band walks with GTB around ankles 3x10 Retro Walks/Monster Walks with RTB 3x10 Squats with 5# 2x12  Step onto air-ex fwd and lateral approx 2x10  Prone planks 3x30 sec Side planks 2 x 30 sec      PATIENT EDUCATION:  Education details:  POC, rationale of exercises and exercise volume/intensity, rationale of interventions, relevant anatomy, and sx response. Person educated: Patient Education method: Explanation, Demonstration, Tactile cues, Verbal cues, Education comprehension: verbalized understanding, returned demonstration, verbal cues required, tactile cues required, and needs further education  HOME EXERCISE PROGRAM: Access Code: URL: [email or web address]   3-layer heel lift PRI Rt sidelying knee over  knee (handout provided)   ASSESSMENT:  CLINICAL IMPRESSION: Therapy focused more on exercises today. She continues to have pain but it was not significantly worse. The manual therapy appears to provide transient   OBJECTIVE IMPAIRMENTS: Abnormal gait, decreased activity tolerance, decreased endurance, decreased mobility, difficulty walking, decreased ROM, decreased strength, hypomobility, and pain.   ACTIVITY LIMITATIONS: lifting, bending, sitting, standing, squatting, stairs, transfers, and locomotion level  PARTICIPATION LIMITATIONS: cleaning, laundry, driving, shopping, and community activity  PERSONAL FACTORS: Time since onset of injury/illness/exacerbation and 3+ comorbidities: R hip labral repair, L ACL repair x 2, cervical fusion x 2 are also affecting patient's functional outcome.   REHAB POTENTIAL: Good  CLINICAL DECISION MAKING: Evolving/moderate complexity  EVALUATION COMPLEXITY: Moderate   GOALS:  SHORT TERM GOALS: Target date:  05/28/2023   Pt will be independent with HEP for improved pain, strength, tolerance to activity, and function.  Baseline: Goal status: met for short term  2.  Pt will tolerate aquatic therapy without adverse effects in order to perform exercises in a reduced stress environment and improve functional mobility, tolerance to activity, strength, and pain.  Baseline:  Goal status: MET 6/26 Target date:  05/21/2023   3.  Pt's worst pain will be no > than 8/10 for improved tolerance to activity and mobility.  Baseline: 5/10 Goal status: high levels of pain today 6/26  4.  Pt will report at least a 25% improvement in her daily mobility.   Baseline:  Goal status: MET is doing more overall 6/26  5.  Pt will demonstrate improved quality of gait including improved Wb'ing thru R LE, reduced hip drop, and reduced limp.  Baseline:  Goal status: MET  6.  Pt will perform a 6 inch step up with good form and stability without significant pain Baseline:   Goal status: MET -05/24/23 Target date:  06/04/2023  7.  Pt will ambulate with a normalized heel to toe gait pattern without limping.   Goal status:  MET    LONG TERM GOALS: Target date: 08/20/2023   Pt will ambulate extended community distance without significant pain and difficulty.  Baseline:  Goal status: ONGOING 6/26  2.  Pt will ascend and descend stairs with a reciprocal gait with a rail.  Baseline:  Goal status: working on stairs 6/25 3.  Pt will be able to walk her neighborhood without significant pain. Baseline:  Goal status: In progress has started walking but still painful 6/25  4.  Pt will be able to perform her ADLs and IADLs including household chores without significant pain and difficulty.  Baseline:  Goal status: continued pain 6/25  5.  Pt will squat with symmetrical Wb'ing in bilat LE's and good form without increased pain for improved functional strength.  Baseline:  Goal status: INITIAL  6.  Pt will  demo 4 to 4+/5 strength in R hip abd, 4/5 in R hip flex, and 5/5 in R knee ext for improved performance of and tolerance with functional mobility.  Baseline:  Goal status: INITIAL   PLAN:  PT FREQUENCY:   2-3x/wk  PT DURATION: 8 weeks  PLANNED INTERVENTIONS: 97164- PT Re-evaluation, 97110-Therapeutic exercises, 97530- Therapeutic activity, 97112- Neuromuscular re-education, 97535- Self Care, [id number]- Manual therapy, [phone]- Gait training, [phone]- Aquatic Therapy, [phone]- Electrical stimulation (unattended), Patient/Family education, Balance training, Stair training, Taping, Dry Needling, Joint mobilization, Spinal mobilization, Scar mobilization, Cryotherapy, and Moist heat  PLAN FOR NEXT SESSION:  Cont with land based therapy focusing on LE and core strengthening.  Cont with DN and exercises per protocol per pt tolerance.     Serinity C. Hightower PT, DPT 09/05/23 1:16 PM

## 2023-09-06 ENCOUNTER — Encounter (HOSPITAL_BASED_OUTPATIENT_CLINIC_OR_DEPARTMENT_OTHER): Payer: Self-pay | Admitting: Physical Therapy

## 2023-09-06 ENCOUNTER — Ambulatory Visit (HOSPITAL_BASED_OUTPATIENT_CLINIC_OR_DEPARTMENT_OTHER): Admitting: Physical Therapy

## 2023-09-06 DIAGNOSIS — M25651 Stiffness of right hip, not elsewhere classified: Secondary | ICD-10-CM

## 2023-09-06 DIAGNOSIS — M6281 Muscle weakness (generalized): Secondary | ICD-10-CM

## 2023-09-06 DIAGNOSIS — R262 Difficulty in walking, not elsewhere classified: Secondary | ICD-10-CM

## 2023-09-06 DIAGNOSIS — M25551 Pain in right hip: Secondary | ICD-10-CM

## 2023-09-06 NOTE — Therapy (Signed)
 OUTPATIENT PHYSICAL THERAPY LOWER EXTREMITY TREATMENT     Patient Name: [name] MRN: [id number] DOB:08-Apr-1985, 38 y.o., female Today's Date: 09/06/2023  END OF SESSION:  PT End of Session - 09/06/23 1501     Visit Number 40    Number of Visits 51    Date for PT Re-Evaluation 10/17/23    Authorization Type [employer]    PT Start Time 1430    PT Stop Time 1512    PT Time Calculation (min) 42 min    Activity Tolerance Patient tolerated treatment well    Behavior During Therapy WFL for tasks assessed/performed                               Past Medical History:  Diagnosis Date   ACL graft tear (HCC)    left knee   Allergy     Angio-edema    Anxiety    Complication of anesthesia    woke up during surgery   Depression    Family history of adverse reaction to anesthesia     mom is difficult to put to sleep   IUD (intrauterine device) in place    Knee pain, right    Migraine headache    Sleep paralysis    Ulcerative proctitis (HCC)    Past Surgical History:  Procedure Laterality Date   ANTERIOR CRUCIATE LIGAMENT REPAIR Left 08/10/2014   Procedure: ANTERIOR CRUCIATE LIGAMENT (ACL) REPAIR;  Surgeon: Lamar Collet, MD;  Location: [facility];  Service: Orthopedics;  Laterality: Left;   HIP SURGERY Right 08/25/2019   PAO surgery [facility] Redell kerns   HIP SURGERY Bilateral 01/05/2021   [facility], had hardware taken out   KNEE ARTHROSCOPY Left 08/10/2014   Procedure: ARTHROSCOPY KNEE DEBRIDEMENT ALLOGRAFT ACL REVISION RECONSTRUCTION;  Surgeon: Lamar Collet, MD;  Location: [facility];  Service: Orthopedics;  Laterality: Left;   KNEE ARTHROSCOPY W/ ACL RECONSTRUCTION Left 2006   SPINAL FUSION     widom tooth extraction     only had 1 wisdom tooth    Patient Active Problem List   Diagnosis Date Noted   Ulcerative proctitis with rectal bleeding (HCC) 06/16/2020   Bloating 06/16/2020   Chronic migraine without aura without  status migrainosus, not intractable 08/19/2018   Migraine with aura and with status migrainosus, not intractable 08/19/2018   Pain in left shoulder 08/19/2018   Pain in left ankle and joints of left foot 05/09/2018   Pain in left hip 05/09/2018   Cervicalgia 05/09/2018   Chronic rhinitis 11/12/2017   Angioedema 11/12/2017   Allergic reaction 11/12/2017   Chronic sinusitis 11/12/2017   Cervical intraepithelial neoplasia grade 1 10/01/2017   GAD (generalized anxiety disorder) 09/17/2014   ACL graft tear (HCC) 08/10/2014   S/P ACL reconstruction 08/10/2014   Migraine headache 05/15/2010     REFERRING PROVIDER: kerns Redell Lenis, MD  REFERRING DIAG: [phone] (ICD-10-CM) - Status post hip surgery / s/p arthroscopy of hip  S32.691K (ICD-10-CM) - Other specified fracture of right ischium, subsequent encounter for fracture with nonunion  THERAPY DIAG:  Pain in right hip  Muscle weakness (generalized)  Difficulty in walking, not elsewhere classified  Stiffness of right hip, not elsewhere classified  Rationale for Evaluation and Treatment: Rehabilitation  ONSET DATE: DOS  12/27/2022   SUBJECTIVE:   SUBJECTIVE STATEMENT: The patient reports no increase in pain but no decrease either.  PERTINENT HISTORY: Hx of hip surgeries: -  Revision R hip femoroplasty, arthroscopy with labral repair, open internal fixation post pelv ring Fx on 12/27/2022 -R hip arthroscopy with femoroplasty, labral repair, and pelvis osteotomy on 08/24/2019 (PAO)  -L hip labral repair and hip bone osteotomy, femoroplasty on 03/21/20 -hardware removal on 01/05/21 in bilat hips     L ACL repair x 2 in 06 and 2016 GAD Migraines Ulcerative colitis and celiac Mast cell activation syndrome Cervical fusion in 2020 and 2021   PAIN: Are you having any pain?  Yes  NPRS: 5/10 current, 8/10 worst, 4/10 best  Location:  R anterior hip, R glute/piriformis, HS, PSIS area Type:  constant  PRECAUTIONS: Other: per  surgical protocol, R hip labral repair, L ACL repair x 2, cervical fusion x 2   WEIGHT BEARING RESTRICTIONS: none indicated on orders  FALLS:  Has patient fallen in last 6 months? No  LIVING ENVIRONMENT: Lives with: lives with their spouse Lives in:  2 story home but moving into a 1 story Stairs: yes   PLOF: Independent  PATIENT GOALS: to be pain-free, improve ROM, improve quality of life    OBJECTIVE:  Note: Objective measures were completed at Evaluation unless otherwise noted.  DIAGNOSTIC FINDINGS: Pelvis x ray on 04/05/23:   Findings/Impression:  Hardware: Right acetabular plate and screw fixation without complication. Alignment: No dislocation. Bones: No acute fracture. Healing osteotomy of the right superior pubic ramus root. Joints: Mild degenerative changes of the left hip. Soft Tissues: Unremarkable.    PATIENT SURVEYS:  LEFS Initial/current:  41/80 / 35/80 LEFS 6/9: 63/80  COGNITION: Overall cognitive status: Within functional limits for tasks assessed       LOWER EXTREMITY ROM:  Active ROM Right eval Left eval Right 4/22  Hip flexion 105 118 104  Hip extension     Hip abduction 22 30 28   Hip adduction     Hip internal rotation 42 24   Hip external rotation 24 40 32  Knee flexion     Knee extension     Ankle dorsiflexion     Ankle plantarflexion     Ankle inversion     Ankle eversion      (Blank rows = not tested)  LOWER EXTREMITY HHD:  Hip abd (seated):  R:  11.3, L:  16.9 LOWER EXTREMITY MMT:    MMT Right 5/21 Left 5/21 Right  left  Hip flexion 11.6 18.6 22.3 29.6  Hip extension      Hip abduction 18.9 21.4 20.5 26.6  Hip adduction      Hip internal rotation      Hip external rotation      Knee flexion      Knee extension 23.8 32.6 36.3 44.3  Ankle dorsiflexion      Ankle plantarflexion      Ankle inversion      Ankle eversion       (Blank rows = not tested)   GAIT: Assistive device utilized: None Level of assistance:  Complete Independence Comments:  Improved quality of gait with reduced limp though still favors R LE with gait.  Appears to have a R hip drop.  Decreased hip extension.  7/10 pain with ambulation.  TREATMENT DATE:   7/11 Neuromuscular reeducation: Step on the Airex forward and lateral 2 x 20 each Heel/toe rock on Airex 3 X15 Cable walk 2x10 25 lbs   There-ex: Knee extension machine 15 lbs 3x15  Leg press 3x15   Reviewed RPE and how to set up      7/9  Neuromuscular reeducation: Step on the Airex forward and lateral 2 x 20 each Heel/toe rock on Airex 3 X15  Exercise ball 90/90 position Hamstring iso  3x10  Double knee to chest 3x10   There-act:  Step down eccentric 3x10 4 inch  Step and drive 6k89 6 inch  Lateral step and drive 6k89 6 inch   Treatment                            7/7: Blank lines following charge title = not provided on this treatment date.   Manual:  TPDN No IASTM & rolling to anterior and lateral incisions MET- Rt flexors/Lt extensors followed by abd/add Spring mob to encourage Lt post inom rotation There-ex:  There-Act:  Self Care: Anatomy of condition, recap of symptoms & changes,  looked at MRI from April Nuro-Re-ed:  Gait Training:    7/3 Elliptical lvl 1-2 x 5 mins Split squat with UE support 2 x 10 reps Retro Walks/Monster Walks with RTB 3x10 Lateral band walks with RTB/GTB at ankles x1/2 reps Squats 2x10 with 8# Birddogs on p-ball  Dead lift from 6 inch lift 26 pounds 2 x 12  Prone planks 2x30 sec    6/30  Manual Therapy: Reviewed pt presentation and pain level.   STM to R sided lumbar paraspinals, R glute, and R HS in prone Rolling with the roller to R glute, HS, and anterior hip/proximal ant thigh in prone  6/25 Manual: Trigger point release to lower lumbar spine, gluteals, and IT band. LAD  with grade 2 and 3 oscillations to right lower extremity Grade III Posterior glides   TherEX: Assess strength Assess range of motion Reviewed goals pertaining to strength and range of motion.   6/18   Manual: Trigger point release to lower lumbar spine, gluteals, and IT band. LAD with grade 2 and 3 oscillations to right lower extremity  Neuro reeducation: Dead lift from 4 inch lift 26 pounds 3 x 12 RPE of 6 Deep squat 3 x 10 using TRX for control Goblet squat in mirror 3x10 15 lbs     6/16 Elliptical lvl 0-1 x 6 mins Qped birddogs x 10 Birddogs on p-ball x10 Retro Walks/Monster Walks with RTB 3x10 Squats with 8# 2x10 Split squat with UE support x 10 reps Dead lift from 6 inch lift 26 pounds 2 x 12  Prone planks 3x30 sec Side planks x 30sec each    6/12 Manual: Trigger point release to lower lumbar spine, gluteals, and IT band. LAD with grade 2 and 3 oscillations to right lower extremity  Neuro-re-ed:  Cable walks fwd and back 15 lbs x10 each  Air-ex hop and hold x20 fwd and lateral  Heel/toe rocks with hold at end 2x15   Neuro reeducation: Dead lift from 4 inch lift 26 pounds 3 x 12 RPE of 6 Deep squat 3 x 10 using TRX for control  6/9: IASTM Rt HS Qped- knee flexion, hip ext with knee flexed, knee ext, lower straight leg  Done bilaterally, demo in primal push up Wall bridges  +heel lift, + heel lift with single  leg lower Straight leg lift/lower from wall Primal push up-down dog    6/5 Manual: Trigger point release to lower lumbar spine, gluteals, and IT band. LAD with grade 2 and 3 oscillations to right lower extremity  Neuro reeducation: Dead lift from 4 inch lift 26 pounds 3 x 12 RPE of 6 Deep squat 3 x 10 using TRX for control   6/2 Elliptical lvl 0 x 5 mins Lateral band walks with GTB around ankles 3x10 Retro Walks/Monster Walks with RTB 3x10 Squats with 5# 2x12  Step onto air-ex fwd and lateral approx 2x10  Prone planks 3x30  sec Side planks 2 x 30 sec      PATIENT EDUCATION:  Education details:  POC, rationale of exercises and exercise volume/intensity, rationale of interventions, relevant anatomy, and sx response. Person educated: Patient Education method: Explanation, Demonstration, Tactile cues, Verbal cues, Education comprehension: verbalized understanding, returned demonstration, verbal cues required, tactile cues required, and needs further education  HOME EXERCISE PROGRAM: Access Code: URL: [email or web address]   3-layer heel lift PRI Rt sidelying knee over knee (handout provided)   ASSESSMENT:  CLINICAL IMPRESSION: Therapy continues to progress her strength and stability exercises. She tolerated well. We reviewed use of gym equipment and how to gade. She tolerated well. She reports the pain is about the same. It hurts but doesn't get significantly worse.  OBJECTIVE IMPAIRMENTS: Abnormal gait, decreased activity tolerance, decreased endurance, decreased mobility, difficulty walking, decreased ROM, decreased strength, hypomobility, and pain.   ACTIVITY LIMITATIONS: lifting, bending, sitting, standing, squatting, stairs, transfers, and locomotion level  PARTICIPATION LIMITATIONS: cleaning, laundry, driving, shopping, and community activity  PERSONAL FACTORS: Time since onset of injury/illness/exacerbation and 3+ comorbidities: R hip labral repair, L ACL repair x 2, cervical fusion x 2 are also affecting patient's functional outcome.   REHAB POTENTIAL: Good  CLINICAL DECISION MAKING: Evolving/moderate complexity  EVALUATION COMPLEXITY: Moderate   GOALS:  SHORT TERM GOALS: Target date:  05/28/2023   Pt will be independent with HEP for improved pain, strength, tolerance to activity, and function.  Baseline: Goal status: met for short term  2.  Pt will tolerate aquatic therapy without adverse effects in order to perform exercises in a reduced stress environment and  improve functional mobility, tolerance to activity, strength, and pain.  Baseline:  Goal status: MET 6/26 Target date:  05/21/2023   3.  Pt's worst pain will be no > than 8/10 for improved tolerance to activity and mobility.  Baseline: 5/10 Goal status: high levels of pain today 6/26  4.  Pt will report at least a 25% improvement in her daily mobility.   Baseline:  Goal status: MET is doing more overall 6/26  5.  Pt will demonstrate improved quality of gait including improved Wb'ing thru R LE, reduced hip drop, and reduced limp.  Baseline:  Goal status: MET  6.  Pt will perform a 6 inch step up with good form and stability without significant pain Baseline:  Goal status: MET -05/24/23 Target date:  06/04/2023  7.  Pt will ambulate with a normalized heel to toe gait pattern without limping.   Goal status:  MET    LONG TERM GOALS: Target date: 08/20/2023   Pt will ambulate extended community distance without significant pain and difficulty.  Baseline:  Goal status: ONGOING 6/26  2.  Pt will ascend and descend stairs with a reciprocal gait with a rail.  Baseline:  Goal status: working on stairs 6/25 3.  Pt will be able to  walk her neighborhood without significant pain. Baseline:  Goal status: In progress has started walking but still painful 6/25  4.  Pt will be able to perform her ADLs and IADLs including household chores without significant pain and difficulty.  Baseline:  Goal status: continued pain 6/25  5.  Pt will squat with symmetrical Wb'ing in bilat LE's and good form without increased pain for improved functional strength.  Baseline:  Goal status: INITIAL  6.  Pt will demo 4 to 4+/5 strength in R hip abd, 4/5 in R hip flex, and 5/5 in R knee ext for improved performance of and tolerance with functional mobility.  Baseline:  Goal status: INITIAL   PLAN:  PT FREQUENCY:   2-3x/wk  PT DURATION: 8 weeks  PLANNED INTERVENTIONS: 97164- PT Re-evaluation,  97110-Therapeutic exercises, 97530- Therapeutic activity, 97112- Neuromuscular re-education, 97535- Self Care, [id number]- Manual therapy, [phone]- Gait training, [phone]- Aquatic Therapy, [phone]- Electrical stimulation (unattended), Patient/Family education, Balance training, Stair training, Taping, Dry Needling, Joint mobilization, Spinal mobilization, Scar mobilization, Cryotherapy, and Moist heat  PLAN FOR NEXT SESSION:  Cont with land based therapy focusing on LE and core strengthening.  Cont with DN and exercises per protocol per pt tolerance.     Alm Don PT DPT 09/06/23 3:10 PM

## 2023-09-08 NOTE — Therapy (Incomplete)
 OUTPATIENT PHYSICAL THERAPY LOWER EXTREMITY TREATMENT     Patient Name: [name] MRN: [id number] DOB:[birth year], 38 y.o., female Today's Date: 09/08/2023  END OF SESSION:                         Past Medical History:  Diagnosis Date   ACL graft tear (HCC)    left knee   Allergy     Angio-edema    Anxiety    Complication of anesthesia    woke up during surgery   Depression    Family history of adverse reaction to anesthesia     mom is difficult to put to sleep   IUD (intrauterine device) in place    Knee pain, right    Migraine headache    Sleep paralysis    Ulcerative proctitis [facility])    Past Surgical History:  Procedure Laterality Date   ANTERIOR CRUCIATE LIGAMENT REPAIR Left 08/10/2014   Procedure: ANTERIOR CRUCIATE LIGAMENT (ACL) REPAIR;  Surgeon: Lamar Collet, MD;  Location: [facility] ;  Service: Orthopedics;  Laterality: Left;   HIP SURGERY Right 08/25/2019   PAO surgery [facility] Redell kerns   HIP SURGERY Bilateral 01/05/2021   [facility], had hardware taken out   KNEE ARTHROSCOPY Left 08/10/2014   Procedure: ARTHROSCOPY KNEE DEBRIDEMENT ALLOGRAFT ACL REVISION RECONSTRUCTION;  Surgeon: Lamar Collet, MD;  Location: [facility];  Service: Orthopedics;  Laterality: Left;   KNEE ARTHROSCOPY W/ ACL RECONSTRUCTION Left 2006   SPINAL FUSION     widom tooth extraction     only had 1 wisdom tooth    Patient Active Problem List   Diagnosis Date Noted   Ulcerative proctitis with rectal bleeding (HCC) 06/16/2020   Bloating 06/16/2020   Chronic migraine without aura without status migrainosus, not intractable 08/19/2018   Migraine with aura and with status migrainosus, not intractable 08/19/2018   Pain in left shoulder 08/19/2018   Pain in left ankle and joints of left foot 05/09/2018   Pain in left hip 05/09/2018   Cervicalgia 05/09/2018   Chronic rhinitis 11/12/2017   Angioedema 11/12/2017   Allergic reaction  11/12/2017   Chronic sinusitis 11/12/2017   Cervical intraepithelial neoplasia grade 1 10/01/2017   GAD (generalized anxiety disorder) 09/17/2014   ACL graft tear (HCC) 08/10/2014   S/P ACL reconstruction 08/10/2014   Migraine headache 05/15/2010     REFERRING PROVIDER: kerns Redell Lenis, MD  REFERRING DIAG: [phone] (ICD-10-CM) - Status post hip surgery / s/p arthroscopy of hip  S32.691K (ICD-10-CM) - Other specified fracture of right ischium, subsequent encounter for fracture with nonunion  THERAPY DIAG:  No diagnosis found.  Rationale for Evaluation and Treatment: Rehabilitation  ONSET DATE: DOS  12/27/2022   SUBJECTIVE:   SUBJECTIVE STATEMENT: The patient reports no increase in pain but no decrease either.  PERTINENT HISTORY: Hx of hip surgeries: -Revision R hip femoroplasty, arthroscopy with labral repair, open internal fixation post pelv ring Fx on 12/27/2022 -R hip arthroscopy with femoroplasty, labral repair, and pelvis osteotomy on 08/24/2019 (PAO)  -L hip labral repair and hip bone osteotomy, femoroplasty on 03/21/20 -hardware removal on 01/05/21 in bilat hips     L ACL repair x 2 in 06 and 2016 GAD Migraines Ulcerative colitis and celiac Mast cell activation syndrome Cervical fusion in 2020 and 2021   PAIN: Are you having any pain?  Yes  NPRS: 5/10 current, 8/10 worst, 4/10 best  Location:  R anterior hip, R glute/piriformis,  HS, PSIS area Type:  constant  PRECAUTIONS: Other: per surgical protocol, R hip labral repair, L ACL repair x 2, cervical fusion x 2   WEIGHT BEARING RESTRICTIONS: none indicated on orders  FALLS:  Has patient fallen in last 6 months? No  LIVING ENVIRONMENT: Lives with: lives with their spouse Lives in:  2 story home but moving into a 1 story Stairs: yes   PLOF: Independent  PATIENT GOALS: to be pain-free, improve ROM, improve quality of life    OBJECTIVE:  Note: Objective measures were completed at Evaluation unless  otherwise noted.  DIAGNOSTIC FINDINGS: Pelvis x ray on 04/05/23:   Findings/Impression:  Hardware: Right acetabular plate and screw fixation without complication. Alignment: No dislocation. Bones: No acute fracture. Healing osteotomy of the right superior pubic ramus root. Joints: Mild degenerative changes of the left hip. Soft Tissues: Unremarkable.    PATIENT SURVEYS:  LEFS Initial/current:  41/80 / 35/80 LEFS 6/9: 63/80  COGNITION: Overall cognitive status: Within functional limits for tasks assessed       LOWER EXTREMITY ROM:  Active ROM Right eval Left eval Right 4/22  Hip flexion 105 118 104  Hip extension     Hip abduction 22 30 28   Hip adduction     Hip internal rotation 42 24   Hip external rotation 24 40 32  Knee flexion     Knee extension     Ankle dorsiflexion     Ankle plantarflexion     Ankle inversion     Ankle eversion      (Blank rows = not tested)  LOWER EXTREMITY HHD:  Hip abd (seated):  R:  11.3, L:  16.9 LOWER EXTREMITY MMT:    MMT Right 5/21 Left 5/21 Right  6/25 Left 6/25  Hip flexion 11.6 18.6 22.3 29.6  Hip extension      Hip abduction 18.9 21.4 20.5 26.6  Hip adduction      Hip internal rotation      Hip external rotation      Knee flexion      Knee extension 23.8 32.6 36.3 44.3  Ankle dorsiflexion      Ankle plantarflexion      Ankle inversion      Ankle eversion       (Blank rows = not tested)   GAIT: Assistive device utilized: None Level of assistance: Complete Independence Comments:  Improved quality of gait with reduced limp though still favors R LE with gait.  Appears to have a R hip drop.  Decreased hip extension.  7/10 pain with ambulation.                                                                                                                                TREATMENT DATE:   7/14    7/11 Neuromuscular reeducation: Step on the Airex forward and lateral 2 x 20 each Heel/toe rock on Airex 3  X15 Cable walk  2x10 25 lbs   There-ex: Knee extension machine 15 lbs 3x15  Leg press 3x15   Reviewed RPE and how to set up      7/9  Neuromuscular reeducation: Step on the Airex forward and lateral 2 x 20 each Heel/toe rock on Airex 3 X15  Exercise ball 90/90 position Hamstring iso  3x10  Double knee to chest 3x10   There-act:  Step down eccentric 3x10 4 inch  Step and drive 6k89 6 inch  Lateral step and drive 6k89 6 inch   Treatment                            7/7: Blank lines following charge title = not provided on this treatment date.   Manual:  TPDN No IASTM & rolling to anterior and lateral incisions MET- Rt flexors/Lt extensors followed by abd/add Spring mob to encourage Lt post inom rotation There-ex:  There-Act:  Self Care: Anatomy of condition, recap of symptoms & changes,  looked at MRI from April Nuro-Re-ed:  Gait Training:    7/3 Elliptical lvl 1-2 x 5 mins Split squat with UE support 2 x 10 reps Retro Walks/Monster Walks with RTB 3x10 Lateral band walks with RTB/GTB at ankles x1/2 reps Squats 2x10 with 8# Birddogs on p-ball  Dead lift from 6 inch lift 26 pounds 2 x 12  Prone planks 2x30 sec    6/30  Manual Therapy: Reviewed pt presentation and pain level.   STM to R sided lumbar paraspinals, R glute, and R HS in prone Rolling with the roller to R glute, HS, and anterior hip/proximal ant thigh in prone  6/25 Manual: Trigger point release to lower lumbar spine, gluteals, and IT band. LAD with grade 2 and 3 oscillations to right lower extremity Grade III Posterior glides   TherEX: Assess strength Assess range of motion Reviewed goals pertaining to strength and range of motion.   6/18   Manual: Trigger point release to lower lumbar spine, gluteals, and IT band. LAD with grade 2 and 3 oscillations to right lower extremity  Neuro reeducation: Dead lift from 4 inch lift 26 pounds 3 x 12 RPE of 6 Deep squat 3 x 10 using TRX  for control Goblet squat in mirror 3x10 15 lbs     6/16 Elliptical lvl 0-1 x 6 mins Qped birddogs x 10 Birddogs on p-ball x10 Retro Walks/Monster Walks with RTB 3x10 Squats with 8# 2x10 Split squat with UE support x 10 reps Dead lift from 6 inch lift 26 pounds 2 x 12  Prone planks 3x30 sec Side planks x 30sec each    6/12 Manual: Trigger point release to lower lumbar spine, gluteals, and IT band. LAD with grade 2 and 3 oscillations to right lower extremity  Neuro-re-ed:  Cable walks fwd and back 15 lbs x10 each  Air-ex hop and hold x20 fwd and lateral  Heel/toe rocks with hold at end 2x15   Neuro reeducation: Dead lift from 4 inch lift 26 pounds 3 x 12 RPE of 6 Deep squat 3 x 10 using TRX for control  6/9: IASTM Rt HS Qped- knee flexion, hip ext with knee flexed, knee ext, lower straight leg  Done bilaterally, demo in primal push up Wall bridges  +heel lift, + heel lift with single leg lower Straight leg lift/lower from wall Primal push up-down dog    6/5 Manual: Trigger point release to lower lumbar spine, gluteals, and IT  band. LAD with grade 2 and 3 oscillations to right lower extremity  Neuro reeducation: Dead lift from 4 inch lift 26 pounds 3 x 12 RPE of 6 Deep squat 3 x 10 using TRX for control   6/2 Elliptical lvl 0 x 5 mins Lateral band walks with GTB around ankles 3x10 Retro Walks/Monster Walks with RTB 3x10 Squats with 5# 2x12  Step onto air-ex fwd and lateral approx 2x10  Prone planks 3x30 sec Side planks 2 x 30 sec      PATIENT EDUCATION:  Education details:  POC, rationale of exercises and exercise volume/intensity, rationale of interventions, relevant anatomy, and sx response. Person educated: Patient Education method: Explanation, Demonstration, Tactile cues, Verbal cues, Education comprehension: verbalized understanding, returned demonstration, verbal cues required, tactile cues required, and needs further education  HOME  EXERCISE PROGRAM: Access Code: URL: [email or web address]   3-layer heel lift PRI Rt sidelying knee over knee (handout provided)   ASSESSMENT:  CLINICAL IMPRESSION: Therapy continues to progress her strength and stability exercises. She tolerated well. We reviewed use of gym equipment and how to gade. She tolerated well. She reports the pain is about the same. It hurts but doesn't get significantly worse.  OBJECTIVE IMPAIRMENTS: Abnormal gait, decreased activity tolerance, decreased endurance, decreased mobility, difficulty walking, decreased ROM, decreased strength, hypomobility, and pain.   ACTIVITY LIMITATIONS: lifting, bending, sitting, standing, squatting, stairs, transfers, and locomotion level  PARTICIPATION LIMITATIONS: cleaning, laundry, driving, shopping, and community activity  PERSONAL FACTORS: Time since onset of injury/illness/exacerbation and 3+ comorbidities: R hip labral repair, L ACL repair x 2, cervical fusion x 2 are also affecting patient's functional outcome.   REHAB POTENTIAL: Good  CLINICAL DECISION MAKING: Evolving/moderate complexity  EVALUATION COMPLEXITY: Moderate   GOALS:  SHORT TERM GOALS: Target date:  05/28/2023   Pt will be independent with HEP for improved pain, strength, tolerance to activity, and function.  Baseline: Goal status: met for short term  2.  Pt will tolerate aquatic therapy without adverse effects in order to perform exercises in a reduced stress environment and improve functional mobility, tolerance to activity, strength, and pain.  Baseline:  Goal status: MET 6/26 Target date:  05/21/2023   3.  Pt's worst pain will be no > than 8/10 for improved tolerance to activity and mobility.  Baseline: 5/10 Goal status: high levels of pain today 6/26  4.  Pt will report at least a 25% improvement in her daily mobility.   Baseline:  Goal status: MET is doing more overall 6/26  5.  Pt will demonstrate improved  quality of gait including improved Wb'ing thru R LE, reduced hip drop, and reduced limp.  Baseline:  Goal status: MET  6.  Pt will perform a 6 inch step up with good form and stability without significant pain Baseline:  Goal status: MET -05/24/23 Target date:  06/04/2023  7.  Pt will ambulate with a normalized heel to toe gait pattern without limping.   Goal status:  MET    LONG TERM GOALS: Target date: 08/20/2023   Pt will ambulate extended community distance without significant pain and difficulty.  Baseline:  Goal status: ONGOING 6/26  2.  Pt will ascend and descend stairs with a reciprocal gait with a rail.  Baseline:  Goal status: working on stairs 6/25 3.  Pt will be able to walk her neighborhood without significant pain. Baseline:  Goal status: In progress has started walking but still painful 6/25  4.  Pt will be able  to perform her ADLs and IADLs including household chores without significant pain and difficulty.  Baseline:  Goal status: continued pain 6/25  5.  Pt will squat with symmetrical Wb'ing in bilat LE's and good form without increased pain for improved functional strength.  Baseline:  Goal status: INITIAL  6.  Pt will demo 4 to 4+/5 strength in R hip abd, 4/5 in R hip flex, and 5/5 in R knee ext for improved performance of and tolerance with functional mobility.  Baseline:  Goal status: INITIAL   PLAN:  PT FREQUENCY:   2-3x/wk  PT DURATION: 8 weeks  PLANNED INTERVENTIONS: 97164- PT Re-evaluation, 97110-Therapeutic exercises, 97530- Therapeutic activity, 97112- Neuromuscular re-education, 97535- Self Care, 02859- Manual therapy, [phone]- Gait training, [phone]- Aquatic Therapy, [phone]- Electrical stimulation (unattended), Patient/Family education, Balance training, Stair training, Taping, Dry Needling, Joint mobilization, Spinal mobilization, Scar mobilization, Cryotherapy, and Moist heat  PLAN FOR NEXT SESSION:  Cont with land based therapy focusing on LE  and core strengthening.  Cont with DN and exercises per protocol per pt tolerance.     Alm Don PT DPT 09/08/23 2:00 PM

## 2023-09-09 ENCOUNTER — Ambulatory Visit (HOSPITAL_BASED_OUTPATIENT_CLINIC_OR_DEPARTMENT_OTHER): Admitting: Physical Therapy

## 2023-09-10 ENCOUNTER — Telehealth (HOSPITAL_BASED_OUTPATIENT_CLINIC_OR_DEPARTMENT_OTHER): Admitting: Psychiatry

## 2023-09-10 ENCOUNTER — Encounter (HOSPITAL_COMMUNITY): Payer: Self-pay | Admitting: Psychiatry

## 2023-09-10 VITALS — Wt 136.0 lb

## 2023-09-10 DIAGNOSIS — F411 Generalized anxiety disorder: Secondary | ICD-10-CM

## 2023-09-10 DIAGNOSIS — Z62819 Personal history of unspecified abuse in childhood: Secondary | ICD-10-CM

## 2023-09-10 DIAGNOSIS — F4001 Agoraphobia with panic disorder: Secondary | ICD-10-CM | POA: Diagnosis not present

## 2023-09-10 DIAGNOSIS — F330 Major depressive disorder, recurrent, mild: Secondary | ICD-10-CM

## 2023-09-10 MED ORDER — DULOXETINE HCL 40 MG PO CPEP: 40.0000 mg | ORAL_CAPSULE | Freq: Every day | ORAL | 1 refills | Status: AC

## 2023-09-10 MED ORDER — CLONAZEPAM 0.5 MG PO TABS
0.5000 mg | ORAL_TABLET | Freq: Every day | ORAL | 0 refills | Status: AC | PRN
Start: 2023-09-10 — End: ?

## 2023-09-10 NOTE — Progress Notes (Signed)
 [facility] MD Virtual Progress Note   Patient Location: Home Provider Location: Home Office  I connect with patient by video and verified that I am speaking with correct person by using two identifiers. I discussed the limitations of evaluation and management by telemedicine and the availability of in person appointments. I also discussed with the patient that there may be a patient responsible charge related to this service. The patient expressed understanding and agreed to proceed.  [name] [id number] 38 y.o.  09/10/2023 8:30 AM  History of Present Illness:  Patient is 38 year old Caucasian, married, self-employed female with a history of panic attacks, depression, trauma and agoraphobia.  We started her on amitriptyline  and she went up to 20 mg but has not seen significant improvement and actually caused worsening of symptoms.  She feels exhausted, tired with excessive sleep.  She stopped the medicine.  She noticed her anxiety remains a challenge and lately need to take more Klonopin  which is prescribed by her previous PCP.  We have recommended EMDR but due to family death she has not able to contact these places.  She is on Cymbalta  30 mg daily.  When asked to increase the Cymbalta  she replied that she does not feel depressed or hopeless and her biggest concern is mostly anxiety.  Patient was explained that Cymbalta  also helps with the anxiety.  She also reported not able to get refill of acid reflux medicine which may have triggered her her anxiety as started to have more GI issues.  Patient is here with her husband.  They have no children.  She is independent Producer, television/film/video.  Patient is doing physical therapy and husband works for [employer].  She denies any suicidal thoughts or homicidal thoughts.  Energy level is low.  Past Psychiatric History: History of anxiety and depression in the past due to financial stress and not have a job.  Took Cymbalta  up to 60 mg and  Wellbutrin  but stopped after feeling better.  Lexapro  did not work, gabapentin caused visual impairment.  No history of suicidal attempt, mania, psychosis.  History of childhood trauma.  History of cannabis use but stopped after panic attack.    Outpatient Encounter Medications as of 09/10/2023  Medication Sig   amitriptyline  (ELAVIL ) 10 MG tablet Take 1-2 tablets (10-20 mg total) by mouth at bedtime.   clonazePAM  (KLONOPIN ) 0.5 MG tablet Take 1 tablet (0.5 mg total) by mouth daily as needed for anxiety. TAKE 1 TABLET(0.5 MG) BY MOUTH DAILY AS NEEDED FOR ANXIETY   cyclobenzaprine  (FLEXERIL ) 10 MG tablet Take 1 tablet (10 mg total) by mouth 2 (two) times daily as needed for muscle spasms. TAKE 1 TABLET(10 MG) BY MOUTH AT BEDTIME   dicyclomine  (BENTYL ) 10 MG capsule Take 1 capsule (10 mg total) by mouth every 6 (six) hours.   DULoxetine  (CYMBALTA ) 30 MG capsule Take 1 capsule (30 mg total) by mouth daily.   mesalamine  (LIALDA ) 1.2 g EC tablet TAKE 4 TABLETS(4.8 GRAMS) BY MOUTH DAILY WITH BREAKFAST   mesalamine  (ROWASA ) 4 g enema Place 60 mLs (4 g total) rectally 2 (two) times daily.   pantoprazole  (PROTONIX ) 40 MG tablet Take 1 tablet (40 mg total) by mouth 2 (two) times daily for 42 days, THEN 1 tablet (40 mg total) daily. Use Protonix  40 mg PO BID for 6 weeks then reduce to 40mg  daily.SABRA   sulfaSALAzine (AZULFIDINE) 500 MG tablet TAKE 1 TABLET BY MOUTH THREE TIMES DAILY BEFORE MEALS (Patient not taking: Reported on  05/02/2023)   SUMAtriptan  (IMITREX ) 100 MG tablet TAKE 1 TABLET IMMEDIATELY. MAY REPEAT IN 2 HRS AS NEEDED FOR MIGRAINE   No facility-administered encounter medications on file as of 09/10/2023.    No results found for this or any previous visit (from the past 2160 hours).   Psychiatric Specialty Exam: Physical Exam  Review of Systems  Gastrointestinal:        Chronic GI issues  Neurological:  Positive for numbness.    Weight 136 lb (61.7 kg).There is no height or weight on file to  calculate BMI.  General Appearance: Casual  Eye Contact:  Good  Speech:  Clear and Coherent  Volume:  Normal  Mood:  Anxious  Affect:  Congruent  Thought Process:  Goal Directed  Orientation:  Full (Time, Place, and Person)  Thought Content:  Rumination  Suicidal Thoughts:  No  Homicidal Thoughts:  No  Memory:  Immediate;   Good Recent;   Good Remote;   Good  Judgement:  Intact  Insight:  Present  Psychomotor Activity:  Normal  Concentration:  Concentration: Good and Attention Span: Good  Recall:  Good  Fund of Knowledge:  Good  Language:  Good  Akathisia:  No  Handed:  Right  AIMS (if indicated):     Assets:  Communication Skills Desire for Improvement Financial Resources/Insurance Housing Resilience Social Support Talents/Skills Transportation  ADL's:  Intact  Cognition:  WNL  Sleep:  still sleep paralysis and vivid dreams       01/20/2020    2:47 PM 08/19/2019    1:39 PM 07/17/2018    4:05 PM 06/05/2018    3:40 PM 05/05/2018    9:01 AM  Depression screen PHQ 2/9  Decreased Interest 3 0 0 3 1  Down, Depressed, Hopeless 3 0 0 3 2  PHQ - 2 Score 6 0 0 6 3  Altered sleeping 3   3 3   Tired, decreased energy 3   3 3   Change in appetite 0   0 1  Feeling bad or failure about yourself  3   0 1  Trouble concentrating 3   1 2   Moving slowly or fidgety/restless 0   0 0  Suicidal thoughts 0   0 0  PHQ-9 Score 18   13 13   Difficult doing work/chores Very difficult   Very difficult Extremely dIfficult    Assessment/Plan: Agoraphobia with panic attacks - Plan: DULoxetine  40 MG CPEP  GAD (generalized anxiety disorder) - Plan: DULoxetine  40 MG CPEP, clonazePAM  (KLONOPIN ) 0.5 MG tablet  Trauma in childhood - Plan: DULoxetine  40 MG CPEP  MDD (major depressive disorder), recurrent episode, mild (HCC) - Plan: DULoxetine  40 MG CPEP  Patient is a 38 year old female with multiple health issues most noticeably ulcerative colitis, GI issues, migraine headaches, chronic pain with  neuropathy.  Discontinue amitriptyline  since it did not help and caused excessive sedation and tiredness.  Discussed to consider Cymbalta  40 mg as patient currently taking 30 mg to help with anxiety.  I also discussed about getting GeneSight testing and other possibilities like BuSpar.  Patient agreed to get the GeneSight testing.  I also encouraged contact her GI to get the refill of acid reflux medicine as one of the contributing factor is not taking the medicine for acid reflux.  She also like to get the Klonopin  from our office as she is no longer seeing her PCP.  She takes the Klonopin  2-3 times a week and she feels very anxious.  Patient is  not drinking or using any illegal substances.  Will provide Klonopin  0.5 mg as needed for severe anxiety/panic attack number 20 pills.  Increase Cymbalta  40 mg daily.  Patient will contact therapy for EMDR to help her trauma.  She still have vivid dreams and sleep paralysis.  Recommend to call us  back if she has any question or any concern.  Follow-up in 6 weeks.   Follow Up Instructions:     I discussed the assessment and treatment plan with the patient. The patient was provided an opportunity to ask questions and all were answered. The patient agreed with the plan and demonstrated an understanding of the instructions.   The patient was advised to call back or seek an in-person evaluation if the symptoms worsen or if the condition fails to improve as anticipated.    Collaboration of Care: Other provider involved in patient's care AEB notes are available in epic to review  Patient/Guardian was advised Release of Information must be obtained prior to any record release in order to collaborate their care with an outside provider. Patient/Guardian was advised if they have not already done so to contact the registration department to sign all necessary forms in order for us  to release information regarding their care.   Consent: Patient/Guardian gives verbal  consent for treatment and assignment of benefits for services provided during this visit. Patient/Guardian expressed understanding and agreed to proceed.     Total encounter time 29 minutes which includes face-to-face time, chart reviewed, care coordination, order entry and documentation during this encounter.   Note: This document was prepared by [employer] voice dictation technology and any errors that results from this process are unintentional.    Leni ONEIDA Client, MD 09/10/2023

## 2023-09-11 ENCOUNTER — Ambulatory Visit (HOSPITAL_BASED_OUTPATIENT_CLINIC_OR_DEPARTMENT_OTHER): Admitting: Physical Therapy

## 2023-09-12 ENCOUNTER — Ambulatory Visit (HOSPITAL_BASED_OUTPATIENT_CLINIC_OR_DEPARTMENT_OTHER): Admitting: Physical Therapy

## 2023-09-12 ENCOUNTER — Encounter (HOSPITAL_BASED_OUTPATIENT_CLINIC_OR_DEPARTMENT_OTHER): Payer: Self-pay | Admitting: Physical Therapy

## 2023-09-12 DIAGNOSIS — R262 Difficulty in walking, not elsewhere classified: Secondary | ICD-10-CM

## 2023-09-12 DIAGNOSIS — M25551 Pain in right hip: Secondary | ICD-10-CM

## 2023-09-12 DIAGNOSIS — M6281 Muscle weakness (generalized): Secondary | ICD-10-CM

## 2023-09-12 DIAGNOSIS — M25651 Stiffness of right hip, not elsewhere classified: Secondary | ICD-10-CM

## 2023-09-12 NOTE — Therapy (Signed)
 OUTPATIENT PHYSICAL THERAPY LOWER EXTREMITY TREATMENT     Patient Name: [name] MRN: [id number] DOB:Dec 22, 1985, 38 y.o., female Today's Date: 09/13/2023  END OF SESSION:  PT End of Session - 09/12/23 1549     Visit Number 41    Number of Visits 51    Date for PT Re-Evaluation 10/17/23    Authorization Type [employer]    PT Start Time 1545    PT Stop Time 1622    PT Time Calculation (min) 37 min    Activity Tolerance Patient tolerated treatment well    Behavior During Therapy WFL for tasks assessed/performed                                Past Medical History:  Diagnosis Date   ACL graft tear (HCC)    left knee   Allergy     Angio-edema    Anxiety    Complication of anesthesia    woke up during surgery   Depression    Family history of adverse reaction to anesthesia     mom is difficult to put to sleep   IUD (intrauterine device) in place    Knee pain, right    Migraine headache    Sleep paralysis    Ulcerative proctitis (HCC)    Past Surgical History:  Procedure Laterality Date   ANTERIOR CRUCIATE LIGAMENT REPAIR Left 08/10/2014   Procedure: ANTERIOR CRUCIATE LIGAMENT (ACL) REPAIR;  Surgeon: Lamar Collet, MD;  Location: [facility];  Service: Orthopedics;  Laterality: Left;   HIP SURGERY Right 08/25/2019   PAO surgery [facility] Redell kerns   HIP SURGERY Bilateral 01/05/2021   [facility], had hardware taken out   KNEE ARTHROSCOPY Left 08/10/2014   Procedure: ARTHROSCOPY KNEE DEBRIDEMENT ALLOGRAFT ACL REVISION RECONSTRUCTION;  Surgeon: Lamar Collet, MD;  Location: [facility];  Service: Orthopedics;  Laterality: Left;   KNEE ARTHROSCOPY W/ ACL RECONSTRUCTION Left 2006   SPINAL FUSION     widom tooth extraction     only had 1 wisdom tooth    Patient Active Problem List   Diagnosis Date Noted   Ulcerative proctitis with rectal bleeding (HCC) 06/16/2020   Bloating 06/16/2020   Chronic migraine without aura without  status migrainosus, not intractable 08/19/2018   Migraine with aura and with status migrainosus, not intractable 08/19/2018   Pain in left shoulder 08/19/2018   Pain in left ankle and joints of left foot 05/09/2018   Pain in left hip 05/09/2018   Cervicalgia 05/09/2018   Chronic rhinitis 11/12/2017   Angioedema 11/12/2017   Allergic reaction 11/12/2017   Chronic sinusitis 11/12/2017   Cervical intraepithelial neoplasia grade 1 10/01/2017   GAD (generalized anxiety disorder) 09/17/2014   ACL graft tear (HCC) 08/10/2014   S/P ACL reconstruction 08/10/2014   Migraine headache 05/15/2010     REFERRING PROVIDER: kerns Redell Lenis, MD  REFERRING DIAG: [phone] (ICD-10-CM) - Status post hip surgery / s/p arthroscopy of hip  S32.691K (ICD-10-CM) - Other specified fracture of right ischium, subsequent encounter for fracture with nonunion  THERAPY DIAG:  Pain in right hip  Muscle weakness (generalized)  Difficulty in walking, not elsewhere classified  Stiffness of right hip, not elsewhere classified  Rationale for Evaluation and Treatment: Rehabilitation  ONSET DATE: DOS  12/27/2022   SUBJECTIVE:   SUBJECTIVE STATEMENT: Pt is 8.5 months post op.  Pt has been dealing with migraines and is still them.  Pt had a massage 2 days ago.  Pt denies any adverse effects after prior Rx.  PERTINENT HISTORY: Hx of hip surgeries: -Revision R hip femoroplasty, arthroscopy with labral repair, open internal fixation post pelv ring Fx on 12/27/2022 -R hip arthroscopy with femoroplasty, labral repair, and pelvis osteotomy on 08/24/2019 (PAO)  -L hip labral repair and hip bone osteotomy, femoroplasty on 03/21/20 -hardware removal on 01/05/21 in bilat hips     L ACL repair x 2 in 06 and 2016 GAD Migraines Ulcerative colitis and celiac Mast cell activation syndrome Cervical fusion in 2020 and 2021   PAIN: Are you having any pain?  Yes  NPRS: 4/10 current, 8/10 worst, 4/10 best  Location:  R  anterior hip, R glute/piriformis, HS, PSIS area Type:  constant  PRECAUTIONS: Other: per surgical protocol, R hip labral repair, L ACL repair x 2, cervical fusion x 2   WEIGHT BEARING RESTRICTIONS: none indicated on orders  FALLS:  Has patient fallen in last 6 months? No  LIVING ENVIRONMENT: Lives with: lives with their spouse Lives in:  2 story home but moving into a 1 story Stairs: yes   PLOF: Independent  PATIENT GOALS: to be pain-free, improve ROM, improve quality of life    OBJECTIVE:  Note: Objective measures were completed at Evaluation unless otherwise noted.  DIAGNOSTIC FINDINGS: Pelvis x ray on 04/05/23:   Findings/Impression:  Hardware: Right acetabular plate and screw fixation without complication. Alignment: No dislocation. Bones: No acute fracture. Healing osteotomy of the right superior pubic ramus root. Joints: Mild degenerative changes of the left hip. Soft Tissues: Unremarkable.    PATIENT SURVEYS:  LEFS Initial/current:  41/80 / 35/80 LEFS 6/9: 63/80  COGNITION: Overall cognitive status: Within functional limits for tasks assessed       LOWER EXTREMITY ROM:  Active ROM Right eval Left eval Right 4/22  Hip flexion 105 118 104  Hip extension     Hip abduction 22 30 28   Hip adduction     Hip internal rotation 42 24   Hip external rotation 24 40 32  Knee flexion     Knee extension     Ankle dorsiflexion     Ankle plantarflexion     Ankle inversion     Ankle eversion      (Blank rows = not tested)  LOWER EXTREMITY HHD:  Hip abd (seated):  R:  11.3, L:  16.9 LOWER EXTREMITY MMT:    MMT Right 5/21 Left 5/21 Right  6/25 Left 6/25  Hip flexion 11.6 18.6 22.3 29.6  Hip extension      Hip abduction 18.9 21.4 20.5 26.6  Hip adduction      Hip internal rotation      Hip external rotation      Knee flexion      Knee extension 23.8 32.6 36.3 44.3  Ankle dorsiflexion      Ankle plantarflexion      Ankle inversion      Ankle  eversion       (Blank rows = not tested)   GAIT: Assistive device utilized: None Level of assistance: Complete Independence Comments:  Improved quality of gait with reduced limp though still favors R LE with gait.  Appears to have a R hip drop.  Decreased hip extension.  7/10 pain with ambulation.  TREATMENT DATE:   7/14 Elliptical L2-4 x 6 mins Squats with 10# x 10 reps Split squats 2x10-12 Cybex leg press 60# 2x10, 70# approx 10-15 Knee extension machine 15 lbs 2x12-15, 20# x 12 Cable walks 20# x10   7/11 Neuromuscular reeducation: Step on the Airex forward and lateral 2 x 20 each Heel/toe rock on Airex 3 X15 Cable walk 2x10 25 lbs   There-ex: Knee extension machine 15 lbs 3x15  Leg press 3x15   Reviewed RPE and how to set up      7/9  Neuromuscular reeducation: Step on the Airex forward and lateral 2 x 20 each Heel/toe rock on Airex 3 X15  Exercise ball 90/90 position Hamstring iso  3x10  Double knee to chest 3x10   There-act:  Step down eccentric 3x10 4 inch  Step and drive 6k89 6 inch  Lateral step and drive 6k89 6 inch   Treatment                            7/7: Blank lines following charge title = not provided on this treatment date.   Manual:  TPDN No IASTM & rolling to anterior and lateral incisions MET- Rt flexors/Lt extensors followed by abd/add Spring mob to encourage Lt post inom rotation There-ex:  There-Act:  Self Care: Anatomy of condition, recap of symptoms & changes,  looked at MRI from April Nuro-Re-ed:  Gait Training:    7/3 Elliptical lvl 1-2 x 5 mins Split squat with UE support 2 x 10 reps Retro Walks/Monster Walks with RTB 3x10 Lateral band walks with RTB/GTB at ankles x1/2 reps Squats 2x10 with 8# Birddogs on p-ball  Dead lift from 6 inch lift 26 pounds 2 x 12  Prone planks 2x30  sec    6/30  Manual Therapy: Reviewed pt presentation and pain level.   STM to R sided lumbar paraspinals, R glute, and R HS in prone Rolling with the roller to R glute, HS, and anterior hip/proximal ant thigh in prone  6/25 Manual: Trigger point release to lower lumbar spine, gluteals, and IT band. LAD with grade 2 and 3 oscillations to right lower extremity Grade III Posterior glides   TherEX: Assess strength Assess range of motion Reviewed goals pertaining to strength and range of motion.   6/18   Manual: Trigger point release to lower lumbar spine, gluteals, and IT band. LAD with grade 2 and 3 oscillations to right lower extremity  Neuro reeducation: Dead lift from 4 inch lift 26 pounds 3 x 12 RPE of 6 Deep squat 3 x 10 using TRX for control Goblet squat in mirror 3x10 15 lbs     6/16 Elliptical lvl 0-1 x 6 mins Qped birddogs x 10 Birddogs on p-ball x10 Retro Walks/Monster Walks with RTB 3x10 Squats with 8# 2x10 Split squat with UE support x 10 reps Dead lift from 6 inch lift 26 pounds 2 x 12  Prone planks 3x30 sec Side planks x 30sec each        PATIENT EDUCATION:  Education details:  POC, rationale of exercises and exercise volume/intensity, rationale of interventions, relevant anatomy, and sx response.  PT had questions about returning to activities such as hopping.  PT instructed pt to return to see MD and she would need clearance from MD to progress to those activities.  PT also informed her she would have to have acceptable strength.  Person educated: Patient Education method: Explanation, Demonstration, Tactile  cues, Verbal cues, Education comprehension: verbalized understanding, returned demonstration, verbal cues required, tactile cues required, and needs further education  HOME EXERCISE PROGRAM: Access Code: URL: [email or web address]   3-layer heel lift PRI Rt sidelying knee over knee (handout  provided)   ASSESSMENT:  CLINICAL IMPRESSION: Pt presents to Rx with reduced pain today.  PT continues to progress exercises to improve R LE strength and stability, pain, and mobility.  Pt performed gym exercises and had good tolerance with gym exercises.  She responded well to Rx reporting no increased pain after Rx.   OBJECTIVE IMPAIRMENTS: Abnormal gait, decreased activity tolerance, decreased endurance, decreased mobility, difficulty walking, decreased ROM, decreased strength, hypomobility, and pain.   ACTIVITY LIMITATIONS: lifting, bending, sitting, standing, squatting, stairs, transfers, and locomotion level  PARTICIPATION LIMITATIONS: cleaning, laundry, driving, shopping, and community activity  PERSONAL FACTORS: Time since onset of injury/illness/exacerbation and 3+ comorbidities: R hip labral repair, L ACL repair x 2, cervical fusion x 2 are also affecting patient's functional outcome.   REHAB POTENTIAL: Good  CLINICAL DECISION MAKING: Evolving/moderate complexity  EVALUATION COMPLEXITY: Moderate   GOALS:  SHORT TERM GOALS: Target date:  05/28/2023   Pt will be independent with HEP for improved pain, strength, tolerance to activity, and function.  Baseline: Goal status: met for short term  2.  Pt will tolerate aquatic therapy without adverse effects in order to perform exercises in a reduced stress environment and improve functional mobility, tolerance to activity, strength, and pain.  Baseline:  Goal status: MET 6/26 Target date:  05/21/2023   3.  Pt's worst pain will be no > than 8/10 for improved tolerance to activity and mobility.  Baseline: 5/10 Goal status: high levels of pain today 6/26  4.  Pt will report at least a 25% improvement in her daily mobility.   Baseline:  Goal status: MET is doing more overall 6/26  5.  Pt will demonstrate improved quality of gait including improved Wb'ing thru R LE, reduced hip drop, and reduced limp.  Baseline:  Goal status:  MET  6.  Pt will perform a 6 inch step up with good form and stability without significant pain Baseline:  Goal status: MET -05/24/23 Target date:  06/04/2023  7.  Pt will ambulate with a normalized heel to toe gait pattern without limping.   Goal status:  MET    LONG TERM GOALS: Target date: 08/20/2023   Pt will ambulate extended community distance without significant pain and difficulty.  Baseline:  Goal status: ONGOING 6/26  2.  Pt will ascend and descend stairs with a reciprocal gait with a rail.  Baseline:  Goal status: working on stairs 6/25 3.  Pt will be able to walk her neighborhood without significant pain. Baseline:  Goal status: In progress has started walking but still painful 6/25  4.  Pt will be able to perform her ADLs and IADLs including household chores without significant pain and difficulty.  Baseline:  Goal status: continued pain 6/25  5.  Pt will squat with symmetrical Wb'ing in bilat LE's and good form without increased pain for improved functional strength.  Baseline:  Goal status: INITIAL  6.  Pt will demo 4 to 4+/5 strength in R hip abd, 4/5 in R hip flex, and 5/5 in R knee ext for improved performance of and tolerance with functional mobility.  Baseline:  Goal status: INITIAL   PLAN:  PT FREQUENCY:   2-3x/wk  PT DURATION: 8 weeks  PLANNED INTERVENTIONS: 02835- PT Re-evaluation,  97110-Therapeutic exercises, 97530- Therapeutic activity, [id number]- Neuromuscular re-education, [phone]- Self Care, 02859- Manual therapy, [phone]- Gait training, [phone]- Aquatic Therapy, [phone]- Electrical stimulation (unattended), Patient/Family education, Balance training, Stair training, Taping, Dry Needling, Joint mobilization, Spinal mobilization, Scar mobilization, Cryotherapy, and Moist heat  PLAN FOR NEXT SESSION:  Cont with land based therapy focusing on LE and core strengthening.  Cont with gym exercises as appropriate if pt tolerates it well.  Cont with DN and exercises  per protocol per pt tolerance.     Leigh Minerva III PT, DPT 09/13/23 2:16 PM

## 2023-09-16 ENCOUNTER — Encounter (HOSPITAL_BASED_OUTPATIENT_CLINIC_OR_DEPARTMENT_OTHER): Payer: Self-pay | Admitting: Physical Therapy

## 2023-09-16 ENCOUNTER — Ambulatory Visit (HOSPITAL_BASED_OUTPATIENT_CLINIC_OR_DEPARTMENT_OTHER): Admitting: Physical Therapy

## 2023-09-16 DIAGNOSIS — M25551 Pain in right hip: Secondary | ICD-10-CM

## 2023-09-16 DIAGNOSIS — M25651 Stiffness of right hip, not elsewhere classified: Secondary | ICD-10-CM

## 2023-09-16 DIAGNOSIS — R262 Difficulty in walking, not elsewhere classified: Secondary | ICD-10-CM

## 2023-09-16 DIAGNOSIS — M6281 Muscle weakness (generalized): Secondary | ICD-10-CM

## 2023-09-16 NOTE — Therapy (Signed)
 OUTPATIENT PHYSICAL THERAPY LOWER EXTREMITY TREATMENT     Patient Name: [name] MRN: [id number] DOB:08-06-1985, 38 y.o., female Today's Date: 09/16/2023  END OF SESSION:  PT End of Session - 09/16/23 1544     Visit Number 42    Number of Visits 51    Date for PT Re-Evaluation 10/17/23    Authorization Type [employer]    PT Start Time 1540    PT Stop Time 1622    PT Time Calculation (min) 42 min    Activity Tolerance Patient tolerated treatment well    Behavior During Therapy WFL for tasks assessed/performed                                Past Medical History:  Diagnosis Date   ACL graft tear (HCC)    left knee   Allergy     Angio-edema    Anxiety    Complication of anesthesia    woke up during surgery   Depression    Family history of adverse reaction to anesthesia     mom is difficult to put to sleep   IUD (intrauterine device) in place    Knee pain, right    Migraine headache    Sleep paralysis    Ulcerative proctitis (HCC)    Past Surgical History:  Procedure Laterality Date   ANTERIOR CRUCIATE LIGAMENT REPAIR Left 08/10/2014   Procedure: ANTERIOR CRUCIATE LIGAMENT (ACL) REPAIR;  Surgeon: Lamar Collet, MD;  Location: [facility];  Service: Orthopedics;  Laterality: Left;   HIP SURGERY Right 08/25/2019   PAO surgery [facility] Redell kerns   HIP SURGERY Bilateral 01/05/2021   [facility], had hardware taken out   KNEE ARTHROSCOPY Left 08/10/2014   Procedure: ARTHROSCOPY KNEE DEBRIDEMENT ALLOGRAFT ACL REVISION RECONSTRUCTION;  Surgeon: Lamar Collet, MD;  Location: [facility];  Service: Orthopedics;  Laterality: Left;   KNEE ARTHROSCOPY W/ ACL RECONSTRUCTION Left 2006   SPINAL FUSION     widom tooth extraction     only had 1 wisdom tooth    Patient Active Problem List   Diagnosis Date Noted   Ulcerative proctitis with rectal bleeding (HCC) 06/16/2020   Bloating 06/16/2020   Chronic migraine without aura without  status migrainosus, not intractable 08/19/2018   Migraine with aura and with status migrainosus, not intractable 08/19/2018   Pain in left shoulder 08/19/2018   Pain in left ankle and joints of left foot 05/09/2018   Pain in left hip 05/09/2018   Cervicalgia 05/09/2018   Chronic rhinitis 11/12/2017   Angioedema 11/12/2017   Allergic reaction 11/12/2017   Chronic sinusitis 11/12/2017   Cervical intraepithelial neoplasia grade 1 10/01/2017   GAD (generalized anxiety disorder) 09/17/2014   ACL graft tear (HCC) 08/10/2014   S/P ACL reconstruction 08/10/2014   Migraine headache 05/15/2010     REFERRING PROVIDER: kerns Redell Lenis, MD  REFERRING DIAG: [phone] (ICD-10-CM) - Status post hip surgery / s/p arthroscopy of hip  S32.691K (ICD-10-CM) - Other specified fracture of right ischium, subsequent encounter for fracture with nonunion  THERAPY DIAG:  Pain in right hip  Muscle weakness (generalized)  Difficulty in walking, not elsewhere classified  Stiffness of right hip, not elsewhere classified  Rationale for Evaluation and Treatment: Rehabilitation  ONSET DATE: DOS  12/27/2022   SUBJECTIVE:   SUBJECTIVE STATEMENT: Pt is 8.5 months post op.  Pt denies any adverse effects after prior Rx.  Pt  states she is trying to make a follow up with surgeon. Pt was walking in her neighborhood and tripped on a raised sidewalk.  She felt like it jarred her hip; she could feel it in her anterior hip straight through to posterior hip.  Pt walked 2.5 miles yesterday and was exhausted afterward though had no increased pain.   PERTINENT HISTORY: Hx of hip surgeries: -Revision R hip femoroplasty, arthroscopy with labral repair, open internal fixation post pelv ring Fx on 12/27/2022 -R hip arthroscopy with femoroplasty, labral repair, and pelvis osteotomy on 08/24/2019 (PAO)  -L hip labral repair and hip bone osteotomy, femoroplasty on 03/21/20 -hardware removal on 01/05/21 in bilat hips     L  ACL repair x 2 in 06 and 2016 GAD Migraines Ulcerative colitis and celiac Mast cell activation syndrome Cervical fusion in 2020 and 2021   PAIN: Are you having any pain?  Yes  NPRS: 5/10 current, 8/10 worst, 4/10 best  Location:  R anterior hip, R glute/piriformis, HS, PSIS area Type:  constant  PRECAUTIONS: Other: per surgical protocol, R hip labral repair, L ACL repair x 2, cervical fusion x 2   WEIGHT BEARING RESTRICTIONS: none indicated on orders  FALLS:  Has patient fallen in last 6 months? No  LIVING ENVIRONMENT: Lives with: lives with their spouse Lives in:  2 story home but moving into a 1 story Stairs: yes   PLOF: Independent  PATIENT GOALS: to be pain-free, improve ROM, improve quality of life    OBJECTIVE:  Note: Objective measures were completed at Evaluation unless otherwise noted.  DIAGNOSTIC FINDINGS: Pelvis x ray on 04/05/23:   Findings/Impression:  Hardware: Right acetabular plate and screw fixation without complication. Alignment: No dislocation. Bones: No acute fracture. Healing osteotomy of the right superior pubic ramus root. Joints: Mild degenerative changes of the left hip. Soft Tissues: Unremarkable.    PATIENT SURVEYS:  LEFS Initial/current:  41/80 / 35/80 LEFS 6/9: 63/80  COGNITION: Overall cognitive status: Within functional limits for tasks assessed       LOWER EXTREMITY ROM:  Active ROM Right eval Left eval Right 4/22  Hip flexion 105 118 104  Hip extension     Hip abduction 22 30 28   Hip adduction     Hip internal rotation 42 24   Hip external rotation 24 40 32  Knee flexion     Knee extension     Ankle dorsiflexion     Ankle plantarflexion     Ankle inversion     Ankle eversion      (Blank rows = not tested)  LOWER EXTREMITY HHD:  Hip abd (seated):  R:  11.3, L:  16.9 LOWER EXTREMITY MMT:    MMT Right 5/21 Left 5/21 Right  6/25 Left 6/25  Hip flexion 11.6 18.6 22.3 29.6  Hip extension      Hip  abduction 18.9 21.4 20.5 26.6  Hip adduction      Hip internal rotation      Hip external rotation      Knee flexion      Knee extension 23.8 32.6 36.3 44.3  Ankle dorsiflexion      Ankle plantarflexion      Ankle inversion      Ankle eversion       (Blank rows = not tested)   GAIT: Assistive device utilized: None Level of assistance: Complete Independence Comments:  Improved quality of gait with reduced limp though still favors R LE with gait.  Appears to  have a R hip drop.  Decreased hip extension.  7/10 pain with ambulation.                                                                                                                                TREATMENT DATE:   7/21 Elliptical L2-4 x 7 mins Knee extension machine 15 lbs x20, 20# x15,11 Cybex leg press 70# approx 15, 80# 2 sets of approx 10-12 Cable walks 20# 2x10  Walking lunges  2 sets Birddogs on p-ball 2x10   7/14 Elliptical L2-4 x 6 mins Squats with 10# x 10 reps Split squats 2x10-12 Cybex leg press 60# 2x10, 70# approx 10-15 Knee extension machine 15 lbs 2x12-15, 20# x 12 Cable walks 20# x10   7/11 Neuromuscular reeducation: Step on the Airex forward and lateral 2 x 20 each Heel/toe rock on Airex 3 X15 Cable walk 2x10 25 lbs   There-ex: Knee extension machine 15 lbs 3x15  Leg press 3x15   Reviewed RPE and how to set up      7/9  Neuromuscular reeducation: Step on the Airex forward and lateral 2 x 20 each Heel/toe rock on Airex 3 X15  Exercise ball 90/90 position Hamstring iso  3x10  Double knee to chest 3x10   There-act:  Step down eccentric 3x10 4 inch  Step and drive 6k89 6 inch  Lateral step and drive 6k89 6 inch   Treatment                            7/7: Blank lines following charge title = not provided on this treatment date.   Manual:  TPDN No IASTM & rolling to anterior and lateral incisions MET- Rt flexors/Lt extensors followed by abd/add Spring mob to encourage Lt post  inom rotation There-ex:  There-Act:  Self Care: Anatomy of condition, recap of symptoms & changes,  looked at MRI from April Nuro-Re-ed:  Gait Training:    7/3 Elliptical lvl 1-2 x 5 mins Split squat with UE support 2 x 10 reps Retro Walks/Monster Walks with RTB 3x10 Lateral band walks with RTB/GTB at ankles x1/2 reps Squats 2x10 with 8# Birddogs on p-ball  Dead lift from 6 inch lift 26 pounds 2 x 12  Prone planks 2x30 sec    6/30  Manual Therapy: Reviewed pt presentation and pain level.   STM to R sided lumbar paraspinals, R glute, and R HS in prone Rolling with the roller to R glute, HS, and anterior hip/proximal ant thigh in prone  6/25 Manual: Trigger point release to lower lumbar spine, gluteals, and IT band. LAD with grade 2 and 3 oscillations to right lower extremity Grade III Posterior glides   TherEX: Assess strength Assess range of motion Reviewed goals pertaining to strength and range of motion.      PATIENT EDUCATION:  Education details:  POC, rationale of interventions, relevant anatomy,  exercise form, and sx response.   Person educated: Patient Education method: Explanation, Demonstration, Tactile cues, Verbal cues, Education comprehension: verbalized understanding, returned demonstration, verbal cues required, tactile cues required, and needs further education  HOME EXERCISE PROGRAM: Access Code: URL: [email or web address]   3-layer heel lift PRI Rt sidelying knee over knee (handout provided)   ASSESSMENT:  CLINICAL IMPRESSION: Pt is performing gym exercises and has good tolerance with exercises.  She had no c/o's with exercises.  Pt demonstrates good form with walking lunges.  Pt was challenged with control and balance occasionally with birddogs on p-ball, but was able to correct.  She responded well to Rx having no increased pain after Rx.       OBJECTIVE IMPAIRMENTS: Abnormal gait, decreased activity  tolerance, decreased endurance, decreased mobility, difficulty walking, decreased ROM, decreased strength, hypomobility, and pain.   ACTIVITY LIMITATIONS: lifting, bending, sitting, standing, squatting, stairs, transfers, and locomotion level  PARTICIPATION LIMITATIONS: cleaning, laundry, driving, shopping, and community activity  PERSONAL FACTORS: Time since onset of injury/illness/exacerbation and 3+ comorbidities: R hip labral repair, L ACL repair x 2, cervical fusion x 2 are also affecting patient's functional outcome.   REHAB POTENTIAL: Good  CLINICAL DECISION MAKING: Evolving/moderate complexity  EVALUATION COMPLEXITY: Moderate   GOALS:  SHORT TERM GOALS: Target date:  05/28/2023   Pt will be independent with HEP for improved pain, strength, tolerance to activity, and function.  Baseline: Goal status: met for short term  2.  Pt will tolerate aquatic therapy without adverse effects in order to perform exercises in a reduced stress environment and improve functional mobility, tolerance to activity, strength, and pain.  Baseline:  Goal status: MET 6/26 Target date:  05/21/2023   3.  Pt's worst pain will be no > than 8/10 for improved tolerance to activity and mobility.  Baseline: 5/10 Goal status: high levels of pain today 6/26  4.  Pt will report at least a 25% improvement in her daily mobility.   Baseline:  Goal status: MET is doing more overall 6/26  5.  Pt will demonstrate improved quality of gait including improved Wb'ing thru R LE, reduced hip drop, and reduced limp.  Baseline:  Goal status: MET  6.  Pt will perform a 6 inch step up with good form and stability without significant pain Baseline:  Goal status: MET -05/24/23 Target date:  06/04/2023  7.  Pt will ambulate with a normalized heel to toe gait pattern without limping.   Goal status:  MET    LONG TERM GOALS: Target date: 08/20/2023   Pt will ambulate extended community distance without significant pain  and difficulty.  Baseline:  Goal status: ONGOING 6/26  2.  Pt will ascend and descend stairs with a reciprocal gait with a rail.  Baseline:  Goal status: working on stairs 6/25 3.  Pt will be able to walk her neighborhood without significant pain. Baseline:  Goal status: In progress has started walking but still painful 6/25  4.  Pt will be able to perform her ADLs and IADLs including household chores without significant pain and difficulty.  Baseline:  Goal status: continued pain 6/25  5.  Pt will squat with symmetrical Wb'ing in bilat LE's and good form without increased pain for improved functional strength.  Baseline:  Goal status: INITIAL  6.  Pt will demo 4 to 4+/5 strength in R hip abd, 4/5 in R hip flex, and 5/5 in R knee ext for improved performance of and tolerance with functional  mobility.  Baseline:  Goal status: INITIAL   PLAN:  PT FREQUENCY:   2-3x/wk  PT DURATION: 8 weeks  PLANNED INTERVENTIONS: 97164- PT Re-evaluation, 97110-Therapeutic exercises, 97530- Therapeutic activity, 97112- Neuromuscular re-education, 97535- Self Care, [id number]- Manual therapy, [phone]- Gait training, [phone]- Aquatic Therapy, [phone]- Electrical stimulation (unattended), Patient/Family education, Balance training, Stair training, Taping, Dry Needling, Joint mobilization, Spinal mobilization, Scar mobilization, Cryotherapy, and Moist heat  PLAN FOR NEXT SESSION:  Cont with land based therapy focusing on LE and core strengthening.  Cont with gym exercises as appropriate if pt tolerates it well.  Cont with DN and exercises per protocol per pt tolerance.     Leigh Minerva III PT, DPT 09/16/23 10:49 PM

## 2023-09-18 ENCOUNTER — Ambulatory Visit (HOSPITAL_BASED_OUTPATIENT_CLINIC_OR_DEPARTMENT_OTHER): Admitting: Physical Therapy

## 2023-09-19 ENCOUNTER — Encounter (HOSPITAL_BASED_OUTPATIENT_CLINIC_OR_DEPARTMENT_OTHER): Payer: Self-pay | Admitting: Physical Therapy

## 2023-09-19 ENCOUNTER — Ambulatory Visit (HOSPITAL_BASED_OUTPATIENT_CLINIC_OR_DEPARTMENT_OTHER): Admitting: Physical Therapy

## 2023-09-19 DIAGNOSIS — R262 Difficulty in walking, not elsewhere classified: Secondary | ICD-10-CM

## 2023-09-19 DIAGNOSIS — M6281 Muscle weakness (generalized): Secondary | ICD-10-CM

## 2023-09-19 DIAGNOSIS — M25651 Stiffness of right hip, not elsewhere classified: Secondary | ICD-10-CM

## 2023-09-19 DIAGNOSIS — M25551 Pain in right hip: Secondary | ICD-10-CM

## 2023-09-19 NOTE — Therapy (Signed)
 OUTPATIENT PHYSICAL THERAPY LOWER EXTREMITY TREATMENT     Patient Name: [name] MRN: [id number] DOB:[birth year], 38 y.o., female Today's Date: 09/20/2023  END OF SESSION:  PT End of Session - 09/19/23 1620     Visit Number 43    Number of Visits 51    Date for PT Re-Evaluation 10/17/23    Authorization Type [employer]    PT Start Time 1535    PT Stop Time 1618    PT Time Calculation (min) 43 min    Activity Tolerance Patient tolerated treatment well    Behavior During Therapy WFL for tasks assessed/performed                                 Past Medical History:  Diagnosis Date   ACL graft tear (HCC)    left knee   Allergy     Angio-edema    Anxiety    Complication of anesthesia    woke up during surgery   Depression    Family history of adverse reaction to anesthesia     mom is difficult to put to sleep   IUD (intrauterine device) in place    Knee pain, right    Migraine headache    Sleep paralysis    Ulcerative proctitis (HCC)    Past Surgical History:  Procedure Laterality Date   ANTERIOR CRUCIATE LIGAMENT REPAIR Left 08/10/2014   Procedure: ANTERIOR CRUCIATE LIGAMENT (ACL) REPAIR;  Surgeon: Lamar Collet, MD;  Location: [facility] [place];  Service: Orthopedics;  Laterality: Left;   HIP SURGERY Right 08/25/2019   PAO surgery [facility] Redell kerns   HIP SURGERY Bilateral 01/05/2021   [facility], had hardware taken out   KNEE ARTHROSCOPY Left 08/10/2014   Procedure: ARTHROSCOPY KNEE DEBRIDEMENT ALLOGRAFT ACL REVISION RECONSTRUCTION;  Surgeon: Lamar Collet, MD;  Location: [facility];  Service: Orthopedics;  Laterality: Left;   KNEE ARTHROSCOPY W/ ACL RECONSTRUCTION Left 2006   SPINAL FUSION     widom tooth extraction     only had 1 wisdom tooth    Patient Active Problem List   Diagnosis Date Noted   Ulcerative proctitis with rectal bleeding (HCC) 06/16/2020   Bloating 06/16/2020   Chronic migraine without aura  without status migrainosus, not intractable 08/19/2018   Migraine with aura and with status migrainosus, not intractable 08/19/2018   Pain in left shoulder 08/19/2018   Pain in left ankle and joints of left foot 05/09/2018   Pain in left hip 05/09/2018   Cervicalgia 05/09/2018   Chronic rhinitis 11/12/2017   Angioedema 11/12/2017   Allergic reaction 11/12/2017   Chronic sinusitis 11/12/2017   Cervical intraepithelial neoplasia grade 1 10/01/2017   GAD (generalized anxiety disorder) 09/17/2014   ACL graft tear (HCC) 08/10/2014   S/P ACL reconstruction 08/10/2014   Migraine headache 05/15/2010     REFERRING PROVIDER: kerns Redell Lenis, MD  REFERRING DIAG: [phone] (ICD-10-CM) - Status post hip surgery / s/p arthroscopy of hip  S32.691K (ICD-10-CM) - Other specified fracture of right ischium, subsequent encounter for fracture with nonunion  THERAPY DIAG:  Pain in right hip  Muscle weakness (generalized)  Difficulty in walking, not elsewhere classified  Stiffness of right hip, not elsewhere classified  Rationale for Evaluation and Treatment: Rehabilitation  ONSET DATE: DOS  12/27/2022   SUBJECTIVE:   SUBJECTIVE STATEMENT: Pt is 8.5 months post op.  Pt denies any adverse effects after prior Rx.  Pt states she is hurting at her normal place, the dimple, but also below the dimple.  Pt states the HS feels really tight.  Pt states stretching does not help. Pt reports her pain has not improved.  She states her walking is better.  Pt reports increased activity and mobility.  Pt has been walking 3x/wk from 1.8 - 2.8 miles. Pt has a follow up with MD on 8/20.    PERTINENT HISTORY: Hx of hip surgeries: -Revision R hip femoroplasty, arthroscopy with labral repair, open internal fixation post pelv ring Fx on 12/27/2022 -R hip arthroscopy with femoroplasty, labral repair, and pelvis osteotomy on 08/24/2019 (PAO)  -L hip labral repair and hip bone osteotomy, femoroplasty on  03/21/20 -hardware removal on 01/05/21 in bilat hips     L ACL repair x 2 in 06 and 2016 GAD Migraines Ulcerative colitis and celiac Mast cell activation syndrome Cervical fusion in 2020 and 2021   PAIN: Are you having any pain?  Yes  NPRS: 4/10   /  5/10 Location:  R anterior hip / R superior and central glute, piriformis, HS, PSIS area Type:  constant. Pt feels pulling and heaviness in anterior hip.  PRECAUTIONS: Other: per surgical protocol, R hip labral repair, L ACL repair x 2, cervical fusion x 2   WEIGHT BEARING RESTRICTIONS: none indicated on orders  FALLS:  Has patient fallen in last 6 months? No  LIVING ENVIRONMENT: Lives with: lives with their spouse Lives in:  2 story home but moving into a 1 story Stairs: yes   PLOF: Independent  PATIENT GOALS: to be pain-free, improve ROM, improve quality of life    OBJECTIVE:  Note: Objective measures were completed at Evaluation unless otherwise noted.  DIAGNOSTIC FINDINGS: Pelvis x ray on 04/05/23:   Findings/Impression:  Hardware: Right acetabular plate and screw fixation without complication. Alignment: No dislocation. Bones: No acute fracture. Healing osteotomy of the right superior pubic ramus root. Joints: Mild degenerative changes of the left hip. Soft Tissues: Unremarkable.    PATIENT SURVEYS:  LEFS Initial/current:  41/80 / 35/80 LEFS 6/9: 63/80  COGNITION: Overall cognitive status: Within functional limits for tasks assessed       LOWER EXTREMITY ROM:  Active ROM Right eval Left eval Right 4/22  Hip flexion 105 118 104  Hip extension     Hip abduction 22 30 28   Hip adduction     Hip internal rotation 42 24   Hip external rotation 24 40 32  Knee flexion     Knee extension     Ankle dorsiflexion     Ankle plantarflexion     Ankle inversion     Ankle eversion      (Blank rows = not tested)  LOWER EXTREMITY HHD:  Hip abd (seated):  R:  11.3, L:  16.9 LOWER EXTREMITY MMT:    MMT  Right 5/21 Left 5/21 Right  6/25 Left 6/25  Hip flexion 11.6 18.6 22.3 29.6  Hip extension      Hip abduction 18.9 21.4 20.5 26.6  Hip adduction      Hip internal rotation      Hip external rotation      Knee flexion      Knee extension 23.8 32.6 36.3 44.3  Ankle dorsiflexion      Ankle plantarflexion      Ankle inversion      Ankle eversion       (Blank rows = not tested)   GAIT: Assistive device  utilized: None Level of assistance: Complete Independence Comments:  Improved quality of gait with reduced limp though still favors R LE with gait.  Appears to have a R hip drop.  Decreased hip extension.  7/10 pain with ambulation.                                                                                                                                TREATMENT DATE:   7/24 Elliptical L2-3 x 7 mins Knee extension machine  20# x15,11 Cybex leg press 80# 2x15, 85# x 10-12 Cable walks 20# x10, 25# x10  Walking lunges  2 sets Star reaches with taps 2x5 reps Birddogs on p-ball 2x10  7/21 Elliptical L2-4 x 7 mins Knee extension machine 15 lbs x20, 20# x15,11 Cybex leg press 70# approx 15, 80# 2 sets of approx 10-12 Cable walks 20# 2x10  Walking lunges  2 sets Birddogs on p-ball 2x10   7/14 Elliptical L2-4 x 6 mins Squats with 10# x 10 reps Split squats 2x10-12 Cybex leg press 60# 2x10, 70# approx 10-15 Knee extension machine 15 lbs 2x12-15, 20# x 12 Cable walks 20# x10   7/11 Neuromuscular reeducation: Step on the Airex forward and lateral 2 x 20 each Heel/toe rock on Airex 3 X15 Cable walk 2x10 25 lbs   There-ex: Knee extension machine 15 lbs 3x15  Leg press 3x15   Reviewed RPE and how to set up      7/9  Neuromuscular reeducation: Step on the Airex forward and lateral 2 x 20 each Heel/toe rock on Airex 3 X15  Exercise ball 90/90 position Hamstring iso  3x10  Double knee to chest 3x10   There-act:  Step down eccentric 3x10 4 inch  Step and  drive 6k89 6 inch  Lateral step and drive 6k89 6 inch   Treatment                            7/7: Blank lines following charge title = not provided on this treatment date.   Manual:  TPDN No IASTM & rolling to anterior and lateral incisions MET- Rt flexors/Lt extensors followed by abd/add Spring mob to encourage Lt post inom rotation There-ex:  There-Act:  Self Care: Anatomy of condition, recap of symptoms & changes,  looked at MRI from April Nuro-Re-ed:  Gait Training:       PATIENT EDUCATION:  Education details:  POC, rationale of interventions, relevant anatomy, exercise form, and sx response.   Person educated: Patient Education method: Explanation, Demonstration, Tactile cues, Verbal cues, Education comprehension: verbalized understanding, returned demonstration, verbal cues required, tactile cues required, and needs further education  HOME EXERCISE PROGRAM: Access Code: URL: [email or web address]   3-layer heel lift PRI Rt sidelying knee over knee (handout provided)   ASSESSMENT:  CLINICAL IMPRESSION: Pt's pain is not improving per subjective info.  She states her walking is better and  is increasing her walking program.  Pt reports increased activity and mobility.  Pt performed ther-ex and neuro re-ed activities well and had good tolerance with exercises.  She gives great effort with all exercises and activities.  Pt has good control  and good form with walking lunges.  She was challenged with control and balance occasionally with birddogs on p-ball, but was able to correct.  She responded well to treatment reporting no change in pain after treatment.    OBJECTIVE IMPAIRMENTS: Abnormal gait, decreased activity tolerance, decreased endurance, decreased mobility, difficulty walking, decreased ROM, decreased strength, hypomobility, and pain.   ACTIVITY LIMITATIONS: lifting, bending, sitting, standing, squatting, stairs, transfers, and  locomotion level  PARTICIPATION LIMITATIONS: cleaning, laundry, driving, shopping, and community activity  PERSONAL FACTORS: Time since onset of injury/illness/exacerbation and 3+ comorbidities: R hip labral repair, L ACL repair x 2, cervical fusion x 2 are also affecting patient's functional outcome.   REHAB POTENTIAL: Good  CLINICAL DECISION MAKING: Evolving/moderate complexity  EVALUATION COMPLEXITY: Moderate   GOALS:  SHORT TERM GOALS: Target date:  05/28/2023   Pt will be independent with HEP for improved pain, strength, tolerance to activity, and function.  Baseline: Goal status: met for short term  2.  Pt will tolerate aquatic therapy without adverse effects in order to perform exercises in a reduced stress environment and improve functional mobility, tolerance to activity, strength, and pain.  Baseline:  Goal status: MET 6/26 Target date:  05/21/2023   3.  Pt's worst pain will be no > than 8/10 for improved tolerance to activity and mobility.  Baseline: 5/10 Goal status: high levels of pain today 6/26  4.  Pt will report at least a 25% improvement in her daily mobility.   Baseline:  Goal status: MET is doing more overall 6/26  5.  Pt will demonstrate improved quality of gait including improved Wb'ing thru R LE, reduced hip drop, and reduced limp.  Baseline:  Goal status: MET  6.  Pt will perform a 6 inch step up with good form and stability without significant pain Baseline:  Goal status: MET -05/24/23 Target date:  06/04/2023  7.  Pt will ambulate with a normalized heel to toe gait pattern without limping.   Goal status:  MET    LONG TERM GOALS: Target date: 08/20/2023   Pt will ambulate extended community distance without significant pain and difficulty.  Baseline:  Goal status: ONGOING 6/26  2.  Pt will ascend and descend stairs with a reciprocal gait with a rail.  Baseline:  Goal status: working on stairs 6/25 3.  Pt will be able to walk her  neighborhood without significant pain. Baseline:  Goal status: In progress has started walking but still painful 6/25  4.  Pt will be able to perform her ADLs and IADLs including household chores without significant pain and difficulty.  Baseline:  Goal status: continued pain 6/25  5.  Pt will squat with symmetrical Wb'ing in bilat LE's and good form without increased pain for improved functional strength.  Baseline:  Goal status: INITIAL  6.  Pt will demo 4 to 4+/5 strength in R hip abd, 4/5 in R hip flex, and 5/5 in R knee ext for improved performance of and tolerance with functional mobility.  Baseline:  Goal status: INITIAL   PLAN:  PT FREQUENCY:   2-3x/wk  PT DURATION: 8 weeks  PLANNED INTERVENTIONS: 97164- PT Re-evaluation, 97110-Therapeutic exercises, 97530- Therapeutic activity, 97112- Neuromuscular re-education, 97535- Self Care, 02859- Manual therapy,  02883- Gait training, 02886- Aquatic Therapy, [phone]- Electrical stimulation (unattended), Patient/Family education, Balance training, Stair training, Taping, Dry Needling, Joint mobilization, Spinal mobilization, Scar mobilization, Cryotherapy, and Moist heat  PLAN FOR NEXT SESSION:  Cont with land based therapy focusing on LE and core strengthening.  Cont with gym exercises as appropriate if pt tolerates it well.  Cont with DN and exercises per protocol per pt tolerance.     Leigh Minerva III PT, DPT 09/20/23 3:31 PM

## 2023-09-23 ENCOUNTER — Encounter (HOSPITAL_BASED_OUTPATIENT_CLINIC_OR_DEPARTMENT_OTHER): Payer: Self-pay | Admitting: Physical Therapy

## 2023-09-23 ENCOUNTER — Ambulatory Visit (HOSPITAL_BASED_OUTPATIENT_CLINIC_OR_DEPARTMENT_OTHER): Admitting: Physical Therapy

## 2023-09-23 DIAGNOSIS — M25551 Pain in right hip: Secondary | ICD-10-CM | POA: Diagnosis not present

## 2023-09-23 DIAGNOSIS — M6281 Muscle weakness (generalized): Secondary | ICD-10-CM

## 2023-09-23 DIAGNOSIS — R262 Difficulty in walking, not elsewhere classified: Secondary | ICD-10-CM

## 2023-09-23 DIAGNOSIS — M25651 Stiffness of right hip, not elsewhere classified: Secondary | ICD-10-CM

## 2023-09-23 NOTE — Therapy (Signed)
 OUTPATIENT PHYSICAL THERAPY LOWER EXTREMITY TREATMENT     Patient Name: [name] MRN: [id number] DOB:Jan 29, 1986, 38 y.o., female Today's Date: 09/24/2023  END OF SESSION:  PT End of Session - 09/23/23 1543     Visit Number 44    Number of Visits 51    Date for PT Re-Evaluation 10/17/23    Authorization Type [employer]    PT Start Time 1540    PT Stop Time 1625    PT Time Calculation (min) 45 min    Activity Tolerance Patient tolerated treatment well    Behavior During Therapy WFL for tasks assessed/performed                                 Past Medical History:  Diagnosis Date   ACL graft tear (HCC)    left knee   Allergy     Angio-edema    Anxiety    Complication of anesthesia    woke up during surgery   Depression    Family history of adverse reaction to anesthesia     mom is difficult to put to sleep   IUD (intrauterine device) in place    Knee pain, right    Migraine headache    Sleep paralysis    Ulcerative proctitis (HCC)    Past Surgical History:  Procedure Laterality Date   ANTERIOR CRUCIATE LIGAMENT REPAIR Left 08/10/2014   Procedure: ANTERIOR CRUCIATE LIGAMENT (ACL) REPAIR;  Surgeon: Lamar Collet, MD;  Location: [facility];  Service: Orthopedics;  Laterality: Left;   HIP SURGERY Right 08/25/2019   PAO surgery [facility] Redell kerns   HIP SURGERY Bilateral 01/05/2021   [facility], had hardware taken out   KNEE ARTHROSCOPY Left 08/10/2014   Procedure: ARTHROSCOPY KNEE DEBRIDEMENT ALLOGRAFT ACL REVISION RECONSTRUCTION;  Surgeon: Lamar Collet, MD;  Location: [facility];  Service: Orthopedics;  Laterality: Left;   KNEE ARTHROSCOPY W/ ACL RECONSTRUCTION Left 2006   SPINAL FUSION     widom tooth extraction     only had 1 wisdom tooth    Patient Active Problem List   Diagnosis Date Noted   Ulcerative proctitis with rectal bleeding (HCC) 06/16/2020   Bloating 06/16/2020   Chronic migraine without aura  without status migrainosus, not intractable 08/19/2018   Migraine with aura and with status migrainosus, not intractable 08/19/2018   Pain in left shoulder 08/19/2018   Pain in left ankle and joints of left foot 05/09/2018   Pain in left hip 05/09/2018   Cervicalgia 05/09/2018   Chronic rhinitis 11/12/2017   Angioedema 11/12/2017   Allergic reaction 11/12/2017   Chronic sinusitis 11/12/2017   Cervical intraepithelial neoplasia grade 1 10/01/2017   GAD (generalized anxiety disorder) 09/17/2014   ACL graft tear (HCC) 08/10/2014   S/P ACL reconstruction 08/10/2014   Migraine headache 05/15/2010     REFERRING PROVIDER: kerns Redell Lenis, MD  REFERRING DIAG: [phone] (ICD-10-CM) - Status post hip surgery / s/p arthroscopy of hip  S32.691K (ICD-10-CM) - Other specified fracture of right ischium, subsequent encounter for fracture with nonunion  THERAPY DIAG:  Pain in right hip  Muscle weakness (generalized)  Difficulty in walking, not elsewhere classified  Stiffness of right hip, not elsewhere classified  Rationale for Evaluation and Treatment: Rehabilitation  ONSET DATE: DOS  12/27/2022   SUBJECTIVE:   SUBJECTIVE STATEMENT: Pt is over 8.5 months post op.  Pt denies any adverse effects after prior Rx.  Pt states My dimple hurts.  Pt reports her pain has not improved.  She states her walking is better.  Pt states people have notices she is walking better.  Pt concentrates on how she walks. Pt has a follow up with MD on 8/20.    PERTINENT HISTORY: Hx of hip surgeries: -Revision R hip femoroplasty, arthroscopy with labral repair, open internal fixation post pelv ring Fx on 12/27/2022 -R hip arthroscopy with femoroplasty, labral repair, and pelvis osteotomy on 08/24/2019 (PAO)  -L hip labral repair and hip bone osteotomy, femoroplasty on 03/21/20 -hardware removal on 01/05/21 in bilat hips     L ACL repair x 2 in 06 and 2016 GAD Migraines Ulcerative colitis and  celiac Mast cell activation syndrome Cervical fusion in 2020 and 2021   PAIN: Are you having any pain?  Yes  NPRS: 4/10   /  6/10 Location:  R anterior hip, R superior and central glute, piriformis, HS, /  PSIS area Type:  constant. Pt feels pulling and heaviness in anterior hip.  PRECAUTIONS: Other: per surgical protocol, R hip labral repair, L ACL repair x 2, cervical fusion x 2   WEIGHT BEARING RESTRICTIONS: none indicated on orders  FALLS:  Has patient fallen in last 6 months? No  LIVING ENVIRONMENT: Lives with: lives with their spouse Lives in:  2 story home but moving into a 1 story Stairs: yes   PLOF: Independent  PATIENT GOALS: to be pain-free, improve ROM, improve quality of life    OBJECTIVE:  Note: Objective measures were completed at Evaluation unless otherwise noted.  DIAGNOSTIC FINDINGS: Pelvis x ray on 04/05/23:   Findings/Impression:  Hardware: Right acetabular plate and screw fixation without complication. Alignment: No dislocation. Bones: No acute fracture. Healing osteotomy of the right superior pubic ramus root. Joints: Mild degenerative changes of the left hip. Soft Tissues: Unremarkable.    PATIENT SURVEYS:  LEFS Initial/current:  41/80 / 35/80 LEFS 6/9: 63/80  COGNITION: Overall cognitive status: Within functional limits for tasks assessed       LOWER EXTREMITY ROM:  Active ROM Right eval Left eval Right 4/22  Hip flexion 105 118 104  Hip extension     Hip abduction 22 30 28   Hip adduction     Hip internal rotation 42 24   Hip external rotation 24 40 32  Knee flexion     Knee extension     Ankle dorsiflexion     Ankle plantarflexion     Ankle inversion     Ankle eversion      (Blank rows = not tested)  LOWER EXTREMITY HHD:  Hip abd (seated):  R:  11.3, L:  16.9  LOWER EXTREMITY MMT:    MMT Right 5/21 Left 5/21 Right  6/25 Left 6/25  Hip flexion 11.6 18.6 22.3 29.6  Hip extension      Hip abduction 18.9 21.4  20.5 26.6  Hip adduction      Hip internal rotation      Hip external rotation      Knee flexion      Knee extension 23.8 32.6 36.3 44.3  Ankle dorsiflexion      Ankle plantarflexion      Ankle inversion      Ankle eversion       (Blank rows = not tested)   GAIT: Assistive device utilized: None Level of assistance: Complete Independence Comments:  Improved quality of gait with reduced limp though still favors R LE with gait.  Appears to have a R hip drop.  Decreased hip extension.  7/10 pain with ambulation.                                                                                                                                TREATMENT DATE:   7/28 Elliptical L3 x 5 mins Cybex leg press 80# 2x15, 85# 2x 10-12 Walking lunges  2 sets Squats 2x10 with 10# KB Star reaches with taps 2x5 reps 1D plyoback ball toss 3x12 with 1.1# ball Birddogs on p-ball 2x10 Cable walks 25# x10     7/24 Elliptical L2-3 x 7 mins Knee extension machine  20# x15,11 Cybex leg press 80# 2x15, 85# x 10-12 Cable walks 20# x10, 25# x10  Walking lunges  2 sets Star reaches with taps 2x5 reps Birddogs on p-ball 2x10  7/21 Elliptical L2-4 x 7 mins Knee extension machine 15 lbs x20, 20# x15,11 Cybex leg press 70# approx 15, 80# 2 sets of approx 10-12 Cable walks 20# 2x10  Walking lunges  2 sets Birddogs on p-ball 2x10   7/14 Elliptical L2-4 x 6 mins Squats with 10# x 10 reps Split squats 2x10-12 Cybex leg press 60# 2x10, 70# approx 10-15 Knee extension machine 15 lbs 2x12-15, 20# x 12 Cable walks 20# x10   7/11 Neuromuscular reeducation: Step on the Airex forward and lateral 2 x 20 each Heel/toe rock on Airex 3 X15 Cable walk 2x10 25 lbs   There-ex: Knee extension machine 15 lbs 3x15  Leg press 3x15   Reviewed RPE and how to set up      7/9  Neuromuscular reeducation: Step on the Airex forward and lateral 2 x 20 each Heel/toe rock on Airex 3 X15  Exercise ball 90/90  position Hamstring iso  3x10  Double knee to chest 3x10   There-act:  Step down eccentric 3x10 4 inch  Step and drive 6k89 6 inch  Lateral step and drive 6k89 6 inch   Treatment                            7/7: Blank lines following charge title = not provided on this treatment date.   Manual:  TPDN No IASTM & rolling to anterior and lateral incisions MET- Rt flexors/Lt extensors followed by abd/add Spring mob to encourage Lt post inom rotation There-ex:  There-Act:  Self Care: Anatomy of condition, recap of symptoms & changes,  looked at MRI from April Nuro-Re-ed:  Gait Training:       PATIENT EDUCATION:  Education details:  POC, rationale of interventions, relevant anatomy, exercise form, and sx response.   Person educated: Patient Education method: Explanation, Demonstration, Tactile cues, Verbal cues, Education comprehension: verbalized understanding, returned demonstration, verbal cues required, tactile cues required, and needs further education  HOME EXERCISE PROGRAM: Access Code: URL: [email or web address]   3-layer heel lift PRI Rt sidelying knee  over knee (handout provided)   ASSESSMENT:  CLINICAL IMPRESSION: Pt reports her pain has not improved.  Pt has been performing a walking program and she states her walking is better.  Pt is progressing with gym exercises.  Pt performed ther-ex and neuro re-ed activities well and had good tolerance with exercises.  Pt demonstrates good stability with proprioception activities.  She is also improving with control and performance of birddogs on p-ball.  She responded well to treatment reporting no change in pain after treatment.    OBJECTIVE IMPAIRMENTS: Abnormal gait, decreased activity tolerance, decreased endurance, decreased mobility, difficulty walking, decreased ROM, decreased strength, hypomobility, and pain.   ACTIVITY LIMITATIONS: lifting, bending, sitting, standing, squatting,  stairs, transfers, and locomotion level  PARTICIPATION LIMITATIONS: cleaning, laundry, driving, shopping, and community activity  PERSONAL FACTORS: Time since onset of injury/illness/exacerbation and 3+ comorbidities: R hip labral repair, L ACL repair x 2, cervical fusion x 2 are also affecting patient's functional outcome.   REHAB POTENTIAL: Good  CLINICAL DECISION MAKING: Evolving/moderate complexity  EVALUATION COMPLEXITY: Moderate   GOALS:  SHORT TERM GOALS: Target date:  05/28/2023   Pt will be independent with HEP for improved pain, strength, tolerance to activity, and function.  Baseline: Goal status: met for short term  2.  Pt will tolerate aquatic therapy without adverse effects in order to perform exercises in a reduced stress environment and improve functional mobility, tolerance to activity, strength, and pain.  Baseline:  Goal status: MET 6/26 Target date:  05/21/2023   3.  Pt's worst pain will be no > than 8/10 for improved tolerance to activity and mobility.  Baseline: 5/10 Goal status: high levels of pain today 6/26  4.  Pt will report at least a 25% improvement in her daily mobility.   Baseline:  Goal status: MET is doing more overall 6/26  5.  Pt will demonstrate improved quality of gait including improved Wb'ing thru R LE, reduced hip drop, and reduced limp.  Baseline:  Goal status: MET  6.  Pt will perform a 6 inch step up with good form and stability without significant pain Baseline:  Goal status: MET -05/24/23 Target date:  06/04/2023  7.  Pt will ambulate with a normalized heel to toe gait pattern without limping.   Goal status:  MET    LONG TERM GOALS: Target date: 08/20/2023   Pt will ambulate extended community distance without significant pain and difficulty.  Baseline:  Goal status: ONGOING 6/26  2.  Pt will ascend and descend stairs with a reciprocal gait with a rail.  Baseline:  Goal status: working on stairs 6/25 3.  Pt will be able  to walk her neighborhood without significant pain. Baseline:  Goal status: In progress has started walking but still painful 6/25  4.  Pt will be able to perform her ADLs and IADLs including household chores without significant pain and difficulty.  Baseline:  Goal status: continued pain 6/25  5.  Pt will squat with symmetrical Wb'ing in bilat LE's and good form without increased pain for improved functional strength.  Baseline:  Goal status: INITIAL  6.  Pt will demo 4 to 4+/5 strength in R hip abd, 4/5 in R hip flex, and 5/5 in R knee ext for improved performance of and tolerance with functional mobility.  Baseline:  Goal status: INITIAL   PLAN:  PT FREQUENCY:   2-3x/wk  PT DURATION: 8 weeks  PLANNED INTERVENTIONS: 97164- PT Re-evaluation, 97110-Therapeutic exercises, 97530- Therapeutic activity, 97112-  Neuromuscular re-education, [phone]- Self Care, [id number]- Manual therapy, [phone]- Gait training, [phone]- Aquatic Therapy, [phone]- Electrical stimulation (unattended), Patient/Family education, Balance training, Stair training, Taping, Dry Needling, Joint mobilization, Spinal mobilization, Scar mobilization, Cryotherapy, and Moist heat  PLAN FOR NEXT SESSION:  Cont with land based therapy focusing on LE and core strengthening.  Cont with gym exercises as appropriate if pt tolerates it well.  Cont with DN and exercises per protocol per pt tolerance.     Leigh Minerva III PT, DPT 09/24/23 11:07 PM

## 2023-09-25 ENCOUNTER — Ambulatory Visit (HOSPITAL_BASED_OUTPATIENT_CLINIC_OR_DEPARTMENT_OTHER): Admitting: Physical Therapy

## 2023-09-25 DIAGNOSIS — M6281 Muscle weakness (generalized): Secondary | ICD-10-CM

## 2023-09-25 DIAGNOSIS — M25551 Pain in right hip: Secondary | ICD-10-CM

## 2023-09-25 DIAGNOSIS — M25651 Stiffness of right hip, not elsewhere classified: Secondary | ICD-10-CM

## 2023-09-25 DIAGNOSIS — R262 Difficulty in walking, not elsewhere classified: Secondary | ICD-10-CM

## 2023-09-25 NOTE — Therapy (Signed)
 OUTPATIENT PHYSICAL THERAPY LOWER EXTREMITY TREATMENT     Patient Name: [name] MRN: [id number] DOB:06/04/1985, 38 y.o., female Today's Date: 09/26/2023  END OF SESSION:  PT End of Session - 09/26/23 0849     Visit Number 45    Number of Visits 51    Date for PT Re-Evaluation 10/17/23    PT Start Time 1515    PT Stop Time 1558    PT Time Calculation (min) 43 min    Activity Tolerance Patient tolerated treatment well    Behavior During Therapy WFL for tasks assessed/performed                                  Past Medical History:  Diagnosis Date   ACL graft tear (HCC)    left knee   Allergy     Angio-edema    Anxiety    Complication of anesthesia    woke up during surgery   Depression    Family history of adverse reaction to anesthesia     mom is difficult to put to sleep   IUD (intrauterine device) in place    Knee pain, right    Migraine headache    Sleep paralysis    Ulcerative proctitis (HCC)    Past Surgical History:  Procedure Laterality Date   ANTERIOR CRUCIATE LIGAMENT REPAIR Left 08/10/2014   Procedure: ANTERIOR CRUCIATE LIGAMENT (ACL) REPAIR;  Surgeon: Lamar Collet, MD;  Location: [facility];  Service: Orthopedics;  Laterality: Left;   HIP SURGERY Right 08/25/2019   PAO surgery [facility] Redell kerns   HIP SURGERY Bilateral 01/05/2021   [facility], had hardware taken out   KNEE ARTHROSCOPY Left 08/10/2014   Procedure: ARTHROSCOPY KNEE DEBRIDEMENT ALLOGRAFT ACL REVISION RECONSTRUCTION;  Surgeon: Lamar Collet, MD;  Location: [facility];  Service: Orthopedics;  Laterality: Left;   KNEE ARTHROSCOPY W/ ACL RECONSTRUCTION Left 2006   SPINAL FUSION     widom tooth extraction     only had 1 wisdom tooth    Patient Active Problem List   Diagnosis Date Noted   Ulcerative proctitis with rectal bleeding (HCC) 06/16/2020   Bloating 06/16/2020   Chronic migraine without aura without status migrainosus,  not intractable 08/19/2018   Migraine with aura and with status migrainosus, not intractable 08/19/2018   Pain in left shoulder 08/19/2018   Pain in left ankle and joints of left foot 05/09/2018   Pain in left hip 05/09/2018   Cervicalgia 05/09/2018   Chronic rhinitis 11/12/2017   Angioedema 11/12/2017   Allergic reaction 11/12/2017   Chronic sinusitis 11/12/2017   Cervical intraepithelial neoplasia grade 1 10/01/2017   GAD (generalized anxiety disorder) 09/17/2014   ACL graft tear (HCC) 08/10/2014   S/P ACL reconstruction 08/10/2014   Migraine headache 05/15/2010     REFERRING PROVIDER: kerns Redell Lenis, MD  REFERRING DIAG: [id number] (ICD-10-CM) - Status post hip surgery / s/p arthroscopy of hip  S32.691K (ICD-10-CM) - Other specified fracture of right ischium, subsequent encounter for fracture with nonunion  THERAPY DIAG:  Pain in right hip  Muscle weakness (generalized)  Difficulty in walking, not elsewhere classified  Stiffness of right hip, not elsewhere classified  Rationale for Evaluation and Treatment: Rehabilitation  ONSET DATE: DOS  12/27/2022   SUBJECTIVE:   SUBJECTIVE STATEMENT: Reports pain is about the same. She feels lie she is functionally moving better.    Pt states My dimple  hurts.  Pt reports her pain has not improved.  She states her walking is better.  Pt states people have notices she is walking better.  Pt concentrates on how she walks. Pt has a follow up with MD on 8/20.    PERTINENT HISTORY: Hx of hip surgeries: -Revision R hip femoroplasty, arthroscopy with labral repair, open internal fixation post pelv ring Fx on 12/27/2022 -R hip arthroscopy with femoroplasty, labral repair, and pelvis osteotomy on 08/24/2019 (PAO)  -L hip labral repair and hip bone osteotomy, femoroplasty on 03/21/20 -hardware removal on 01/05/21 in bilat hips     L ACL repair x 2 in 06 and 2016 GAD Migraines Ulcerative colitis and celiac Mast cell activation  syndrome Cervical fusion in 2020 and 2021   PAIN: Are you having any pain?  Yes  NPRS: 4/10   /  6/10 Location:  R anterior hip, R superior and central glute, piriformis, HS, /  PSIS area Type:  constant. Pt feels pulling and heaviness in anterior hip.  PRECAUTIONS: Other: per surgical protocol, R hip labral repair, L ACL repair x 2, cervical fusion x 2   WEIGHT BEARING RESTRICTIONS: none indicated on orders  FALLS:  Has patient fallen in last 6 months? No  LIVING ENVIRONMENT: Lives with: lives with their spouse Lives in:  2 story home but moving into a 1 story Stairs: yes   PLOF: Independent  PATIENT GOALS: to be pain-free, improve ROM, improve quality of life    OBJECTIVE:  Note: Objective measures were completed at Evaluation unless otherwise noted.  DIAGNOSTIC FINDINGS: Pelvis x ray on 04/05/23:   Findings/Impression:  Hardware: Right acetabular plate and screw fixation without complication. Alignment: No dislocation. Bones: No acute fracture. Healing osteotomy of the right superior pubic ramus root. Joints: Mild degenerative changes of the left hip. Soft Tissues: Unremarkable.    PATIENT SURVEYS:  LEFS Initial/current:  41/80 / 35/80 LEFS 6/9: 63/80  COGNITION: Overall cognitive status: Within functional limits for tasks assessed       LOWER EXTREMITY ROM:  Active ROM Right eval Left eval Right 4/22  Hip flexion 105 118 104  Hip extension     Hip abduction 22 30 28   Hip adduction     Hip internal rotation 42 24   Hip external rotation 24 40 32  Knee flexion     Knee extension     Ankle dorsiflexion     Ankle plantarflexion     Ankle inversion     Ankle eversion      (Blank rows = not tested)  LOWER EXTREMITY HHD:  Hip abd (seated):  R:  11.3, L:  16.9  LOWER EXTREMITY MMT:    MMT Right 5/21 Left 5/21 Right  6/25 Left 6/25  Hip flexion 11.6 18.6 22.3 29.6  Hip extension      Hip abduction 18.9 21.4 20.5 26.6  Hip adduction       Hip internal rotation      Hip external rotation      Knee flexion      Knee extension 23.8 32.6 36.3 44.3  Ankle dorsiflexion      Ankle plantarflexion      Ankle inversion      Ankle eversion       (Blank rows = not tested)   GAIT: Assistive device utilized: None Level of assistance: Complete Independence Comments:  Improved quality of gait with reduced limp though still favors R LE with gait.  Appears to have a  R hip drop.  Decreased hip extension.  7/10 pain with ambulation.                                                                                                                                TREATMENT DATE:   7/30  There-ex: Cybex leg press 3x12 85 lbs   Neuro re-ed: Hop on and off air-ex 2x15 fwd and back  Step and drive 6 inch [id number]  Step down 4 inch 3x10 Cable walk x10 30 #        7/28 Elliptical L3 x 5 mins Cybex leg press 80# 2x15, 85# 2x 10-12 Walking lunges  2 sets Squats 2x10 with 10# KB Star reaches with taps 2x5 reps 1D plyoback ball toss 3x12 with 1.1# ball Birddogs on p-ball 2x10 Cable walks 25# x10     7/24 Elliptical L2-3 x 7 mins Knee extension machine  20# x15,11 Cybex leg press 80# 2x15, 85# x 10-12 Cable walks 20# x10, 25# x10  Walking lunges  2 sets Star reaches with taps 2x5 reps Birddogs on p-ball 2x10  7/21 Elliptical L2-4 x 7 mins Knee extension machine 15 lbs x20, 20# x15,11 Cybex leg press 70# approx 15, 80# 2 sets of approx 10-12 Cable walks 20# 2x10  Walking lunges  2 sets Birddogs on p-ball 2x10   7/14 Elliptical L2-4 x 6 mins Squats with 10# x 10 reps Split squats 2x10-12 Cybex leg press 60# 2x10, 70# approx 10-15 Knee extension machine 15 lbs 2x12-15, 20# x 12 Cable walks 20# x10   7/11 Neuromuscular reeducation: Step on the Airex forward and lateral 2 x 20 each Heel/toe rock on Airex 3 X15 Cable walk 2x10 25 lbs   There-ex: Knee extension machine 15 lbs 3x15  Leg press 3x15   Reviewed RPE  and how to set up          PATIENT EDUCATION:  Education details:  POC, rationale of interventions, relevant anatomy, exercise form, and sx response.   Person educated: Patient Education method: Explanation, Demonstration, Tactile cues, Verbal cues, Education comprehension: verbalized understanding, returned demonstration, verbal cues required, tactile cues required, and needs further education  HOME EXERCISE PROGRAM: Access Code: URL: [email or web address]   3-layer heel lift PRI Rt sidelying knee over knee (handout provided)   ASSESSMENT:  CLINICAL IMPRESSION: The patient's stability with exercises looks like it is improving. She was able to do eccentric step downs with good control. She had a mild pain increase but nothing significant. Therapy will continue to advance exercises and stability exercises.    OBJECTIVE IMPAIRMENTS: Abnormal gait, decreased activity tolerance, decreased endurance, decreased mobility, difficulty walking, decreased ROM, decreased strength, hypomobility, and pain.   ACTIVITY LIMITATIONS: lifting, bending, sitting, standing, squatting, stairs, transfers, and locomotion level  PARTICIPATION LIMITATIONS: cleaning, laundry, driving, shopping, and community activity  PERSONAL FACTORS: Time since onset of injury/illness/exacerbation and 3+ comorbidities: R hip labral repair, L ACL repair x 2,  cervical fusion x 2 are also affecting patient's functional outcome.   REHAB POTENTIAL: Good  CLINICAL DECISION MAKING: Evolving/moderate complexity  EVALUATION COMPLEXITY: Moderate   GOALS:  SHORT TERM GOALS: Target date:  05/28/2023   Pt will be independent with HEP for improved pain, strength, tolerance to activity, and function.  Baseline: Goal status: met for short term  2.  Pt will tolerate aquatic therapy without adverse effects in order to perform exercises in a reduced stress environment and improve functional mobility,  tolerance to activity, strength, and pain.  Baseline:  Goal status: MET 6/26 Target date:  05/21/2023   3.  Pt's worst pain will be no > than 8/10 for improved tolerance to activity and mobility.  Baseline: 5/10 Goal status: high levels of pain today 6/26  4.  Pt will report at least a 25% improvement in her daily mobility.   Baseline:  Goal status: MET is doing more overall 6/26  5.  Pt will demonstrate improved quality of gait including improved Wb'ing thru R LE, reduced hip drop, and reduced limp.  Baseline:  Goal status: MET  6.  Pt will perform a 6 inch step up with good form and stability without significant pain Baseline:  Goal status: MET -05/24/23 Target date:  06/04/2023  7.  Pt will ambulate with a normalized heel to toe gait pattern without limping.   Goal status:  MET    LONG TERM GOALS: Target date: 08/20/2023   Pt will ambulate extended community distance without significant pain and difficulty.  Baseline:  Goal status: ONGOING 6/26  2.  Pt will ascend and descend stairs with a reciprocal gait with a rail.  Baseline:  Goal status: working on stairs 6/25 3.  Pt will be able to walk her neighborhood without significant pain. Baseline:  Goal status: In progress has started walking but still painful 6/25  4.  Pt will be able to perform her ADLs and IADLs including household chores without significant pain and difficulty.  Baseline:  Goal status: continued pain 6/25  5.  Pt will squat with symmetrical Wb'ing in bilat LE's and good form without increased pain for improved functional strength.  Baseline:  Goal status: INITIAL  6.  Pt will demo 4 to 4+/5 strength in R hip abd, 4/5 in R hip flex, and 5/5 in R knee ext for improved performance of and tolerance with functional mobility.  Baseline:  Goal status: improving 7/31   PLAN:  PT FREQUENCY:   2-3x/wk  PT DURATION: 8 weeks  PLANNED INTERVENTIONS: 97164- PT Re-evaluation, 97110-Therapeutic exercises,  97530- Therapeutic activity, 97112- Neuromuscular re-education, 97535- Self Care, [id number]- Manual therapy, [phone]- Gait training, [phone]- Aquatic Therapy, [phone]- Electrical stimulation (unattended), Patient/Family education, Balance training, Stair training, Taping, Dry Needling, Joint mobilization, Spinal mobilization, Scar mobilization, Cryotherapy, and Moist heat  PLAN FOR NEXT SESSION:  Cont with land based therapy focusing on LE and core strengthening.  Cont with gym exercises as appropriate if pt tolerates it well.  Cont with DN and exercises per protocol per pt tolerance.     Leigh Minerva III PT, DPT 09/26/23 8:53 AM

## 2023-09-26 ENCOUNTER — Encounter (HOSPITAL_BASED_OUTPATIENT_CLINIC_OR_DEPARTMENT_OTHER): Admitting: Physical Therapy

## 2023-09-26 ENCOUNTER — Encounter (HOSPITAL_BASED_OUTPATIENT_CLINIC_OR_DEPARTMENT_OTHER): Payer: Self-pay | Admitting: Physical Therapy

## 2023-09-29 NOTE — Therapy (Signed)
 OUTPATIENT PHYSICAL THERAPY LOWER EXTREMITY TREATMENT     Patient Name: Paullette Mckain MRN: [id number] DOB:1985-11-27, 38 y.o., female Today's Date: 10/01/2023  END OF SESSION:  PT End of Session - 09/30/23 0807     Visit Number 46    Number of Visits 51    Date for PT Re-Evaluation 10/17/23    Authorization Type UHC    PT Start Time 0807    PT Stop Time 0850    PT Time Calculation (min) 43 min    Activity Tolerance Patient tolerated treatment well    Behavior During Therapy WFL for tasks assessed/performed                                   Past Medical History:  Diagnosis Date   ACL graft tear (HCC)    left knee   Allergy     Angio-edema    Anxiety    Complication of anesthesia    woke up during surgery   Depression    Family history of adverse reaction to anesthesia     mom is difficult to put to sleep   IUD (intrauterine device) in place    Knee pain, right    Migraine headache    Sleep paralysis    Ulcerative proctitis (HCC)    Past Surgical History:  Procedure Laterality Date   ANTERIOR CRUCIATE LIGAMENT REPAIR Left 08/10/2014   Procedure: ANTERIOR CRUCIATE LIGAMENT (ACL) REPAIR;  Surgeon: Lamar Collet, MD;  Location: [facility];  Service: Orthopedics;  Laterality: Left;   HIP SURGERY Right 08/25/2019   PAO surgery [facility] Redell kerns   HIP SURGERY Bilateral 01/05/2021   [facility], had hardware taken out   KNEE ARTHROSCOPY Left 08/10/2014   Procedure: ARTHROSCOPY KNEE DEBRIDEMENT ALLOGRAFT ACL REVISION RECONSTRUCTION;  Surgeon: Lamar Collet, MD;  Location: [facility];  Service: Orthopedics;  Laterality: Left;   KNEE ARTHROSCOPY W/ ACL RECONSTRUCTION Left 2006   SPINAL FUSION     widom tooth extraction     only had 1 wisdom tooth    Patient Active Problem List   Diagnosis Date Noted   Ulcerative proctitis with rectal bleeding (HCC) 06/16/2020   Bloating 06/16/2020   Chronic migraine without aura  without status migrainosus, not intractable 08/19/2018   Migraine with aura and with status migrainosus, not intractable 08/19/2018   Pain in left shoulder 08/19/2018   Pain in left ankle and joints of left foot 05/09/2018   Pain in left hip 05/09/2018   Cervicalgia 05/09/2018   Chronic rhinitis 11/12/2017   Angioedema 11/12/2017   Allergic reaction 11/12/2017   Chronic sinusitis 11/12/2017   Cervical intraepithelial neoplasia grade 1 10/01/2017   GAD (generalized anxiety disorder) 09/17/2014   ACL graft tear (HCC) 08/10/2014   S/P ACL reconstruction 08/10/2014   Migraine headache 05/15/2010     REFERRING PROVIDER: kerns Redell Lenis, MD  REFERRING DIAG: [phone] (ICD-10-CM) - Status post hip surgery / s/p arthroscopy of hip  S32.691K (ICD-10-CM) - Other specified fracture of right ischium, subsequent encounter for fracture with nonunion  THERAPY DIAG:  Pain in right hip  Muscle weakness (generalized)  Difficulty in walking, not elsewhere classified  Stiffness of right hip, not elsewhere classified  Rationale for Evaluation and Treatment: Rehabilitation  ONSET DATE: DOS  12/27/2022   SUBJECTIVE:   SUBJECTIVE STATEMENT: Pt denies any adverse effects after prior Rx.  Pt continues to perform her  walking program though didn't do it as much this weekend.  Pt states her H/A's limited her walking this weekend.  Pt has a follow up with MD on 8/20.    PERTINENT HISTORY: Hx of hip surgeries: -Revision R hip femoroplasty, arthroscopy with labral repair, open internal fixation post pelv ring Fx on 12/27/2022 -R hip arthroscopy with femoroplasty, labral repair, and pelvis osteotomy on 08/24/2019 (PAO)  -L hip labral repair and hip bone osteotomy, femoroplasty on 03/21/20 -hardware removal on 01/05/21 in bilat hips     L ACL repair x 2 in 06 and 2016 GAD Migraines Ulcerative colitis and celiac Mast cell activation syndrome Cervical fusion in 2020 and 2021   PAIN: Are you  having any pain?  Yes  NPRS: 4/10    Location:  R anterior hip, R superior and central glute, piriformis, HS, /  PSIS area Type:  constant. Pt feels pulling and heaviness in anterior hip.  PRECAUTIONS: Other: per surgical protocol, R hip labral repair, L ACL repair x 2, cervical fusion x 2   WEIGHT BEARING RESTRICTIONS: none indicated on orders  FALLS:  Has patient fallen in last 6 months? No  LIVING ENVIRONMENT: Lives with: lives with their spouse Lives in:  2 story home but moving into a 1 story Stairs: yes   PLOF: Independent  PATIENT GOALS: to be pain-free, improve ROM, improve quality of life    OBJECTIVE:  Note: Objective measures were completed at Evaluation unless otherwise noted.  DIAGNOSTIC FINDINGS: Pelvis x ray on 04/05/23:   Findings/Impression:  Hardware: Right acetabular plate and screw fixation without complication. Alignment: No dislocation. Bones: No acute fracture. Healing osteotomy of the right superior pubic ramus root. Joints: Mild degenerative changes of the left hip. Soft Tissues: Unremarkable.    PATIENT SURVEYS:  LEFS Initial/current:  41/80 / 35/80 LEFS 6/9: 63/80  COGNITION: Overall cognitive status: Within functional limits for tasks assessed       LOWER EXTREMITY ROM:  Active ROM Right eval Left eval Right 4/22  Hip flexion 105 118 104  Hip extension     Hip abduction 22 30 28   Hip adduction     Hip internal rotation 42 24   Hip external rotation 24 40 32  Knee flexion     Knee extension     Ankle dorsiflexion     Ankle plantarflexion     Ankle inversion     Ankle eversion      (Blank rows = not tested)  LOWER EXTREMITY HHD:  Hip abd (seated):  R:  11.3, L:  16.9  LOWER EXTREMITY MMT:    MMT Right 5/21 Left 5/21 Right  6/25 Left 6/25  Hip flexion 11.6 18.6 22.3 29.6  Hip extension      Hip abduction 18.9 21.4 20.5 26.6  Hip adduction      Hip internal rotation      Hip external rotation      Knee flexion       Knee extension 23.8 32.6 36.3 44.3  Ankle dorsiflexion      Ankle plantarflexion      Ankle inversion      Ankle eversion       (Blank rows = not tested)   GAIT: Assistive device utilized: None Level of assistance: Complete Independence Comments:  Improved quality of gait with reduced limp though still favors R LE with gait.  Appears to have a R hip drop.  Decreased hip extension.  7/10 pain with ambulation.  TREATMENT DATE:   8/4 Elliptical L2-3 x 6 mins Cybex Leg press  80# x12, 85# 2x12 Step up and drive 6 inch 7k84 Step downs 4 inch 2x10 Walking lunges x 2 laps 1D plyoback ball toss with 1.1# ball 3x15 Star reaches 2x5 reps Cable walks 25# x5, 30# x5 reps    7/30  There-ex: Cybex leg press 3x12 85 lbs   Neuro re-ed: Hop on and off air-ex 2x15 fwd and back  Step and drive 6 inch 7k84  Step down 4 inch 3x10 Cable walk x10 30 #       7/28 Elliptical L3 x 5 mins Cybex leg press 80# 2x15, 85# 2x 10-12 Walking lunges  2 sets Squats 2x10 with 10# KB Star reaches with taps 2x5 reps 1D plyoback ball toss 3x12 with 1.1# ball Birddogs on p-ball 2x10 Cable walks 25# x10     7/24 Elliptical L2-3 x 7 mins Knee extension machine  20# x15,11 Cybex leg press 80# 2x15, 85# x 10-12 Cable walks 20# x10, 25# x10  Walking lunges  2 sets Star reaches with taps 2x5 reps Birddogs on p-ball 2x10  7/21 Elliptical L2-4 x 7 mins Knee extension machine 15 lbs x20, 20# x15,11 Cybex leg press 70# approx 15, 80# 2 sets of approx 10-12 Cable walks 20# 2x10  Walking lunges  2 sets Birddogs on p-ball 2x10   7/14 Elliptical L2-4 x 6 mins Squats with 10# x 10 reps Split squats 2x10-12 Cybex leg press 60# 2x10, 70# approx 10-15 Knee extension machine 15 lbs 2x12-15, 20# x 12 Cable walks 20# x10   7/11 Neuromuscular reeducation: Step on the  Airex forward and lateral 2 x 20 each Heel/toe rock on Airex 3 X15 Cable walk 2x10 25 lbs   There-ex: Knee extension machine 15 lbs 3x15  Leg press 3x15   Reviewed RPE and how to set up       PATIENT EDUCATION:  Education details:  POC, rationale of interventions, relevant anatomy, exercise form, and sx response.   Person educated: Patient Education method: Explanation, Demonstration, Tactile cues, Verbal cues, Education comprehension: verbalized understanding, returned demonstration, verbal cues required, tactile cues required, and needs further education  HOME EXERCISE PROGRAM: Access Code: URL: [email or web address]   3-layer heel lift PRI Rt sidelying knee over knee (handout provided)   ASSESSMENT:  CLINICAL IMPRESSION: Pt continues to improve with exercise tolerance and R LE strength and stability.  She performed exercises well without c/o's.  Pt required cuing for a slight bend in knee and to not use L LE on R LE for balance with 1D plyoback ball toss.  She responded well to Rx having no c/o's after Rx.  Therapy will continue to work on LE strength, proprioception, and stability.      OBJECTIVE IMPAIRMENTS: Abnormal gait, decreased activity tolerance, decreased endurance, decreased mobility, difficulty walking, decreased ROM, decreased strength, hypomobility, and pain.   ACTIVITY LIMITATIONS: lifting, bending, sitting, standing, squatting, stairs, transfers, and locomotion level  PARTICIPATION LIMITATIONS: cleaning, laundry, driving, shopping, and community activity  PERSONAL FACTORS: Time since onset of injury/illness/exacerbation and 3+ comorbidities: R hip labral repair, L ACL repair x 2, cervical fusion x 2 are also affecting patient's functional outcome.   REHAB POTENTIAL: Good  CLINICAL DECISION MAKING: Evolving/moderate complexity  EVALUATION COMPLEXITY: Moderate   GOALS:  SHORT TERM GOALS: Target date:  05/28/2023   Pt will be  independent with HEP for improved pain, strength, tolerance to activity, and function.  Baseline: Goal status:  met for short term  2.  Pt will tolerate aquatic therapy without adverse effects in order to perform exercises in a reduced stress environment and improve functional mobility, tolerance to activity, strength, and pain.  Baseline:  Goal status: MET 6/26 Target date:  05/21/2023   3.  Pt's worst pain will be no > than 8/10 for improved tolerance to activity and mobility.  Baseline: 5/10 Goal status: high levels of pain today 6/26  4.  Pt will report at least a 25% improvement in her daily mobility.   Baseline:  Goal status: MET is doing more overall 6/26  5.  Pt will demonstrate improved quality of gait including improved Wb'ing thru R LE, reduced hip drop, and reduced limp.  Baseline:  Goal status: MET  6.  Pt will perform a 6 inch step up with good form and stability without significant pain Baseline:  Goal status: MET -05/24/23 Target date:  06/04/2023  7.  Pt will ambulate with a normalized heel to toe gait pattern without limping.   Goal status:  MET    LONG TERM GOALS: Target date: 08/20/2023   Pt will ambulate extended community distance without significant pain and difficulty.  Baseline:  Goal status: ONGOING 6/26  2.  Pt will ascend and descend stairs with a reciprocal gait with a rail.  Baseline:  Goal status: working on stairs 6/25 3.  Pt will be able to walk her neighborhood without significant pain. Baseline:  Goal status: In progress has started walking but still painful 6/25  4.  Pt will be able to perform her ADLs and IADLs including household chores without significant pain and difficulty.  Baseline:  Goal status: continued pain 6/25  5.  Pt will squat with symmetrical Wb'ing in bilat LE's and good form without increased pain for improved functional strength.  Baseline:  Goal status: INITIAL  6.  Pt will demo 4 to 4+/5 strength in R hip abd,  4/5 in R hip flex, and 5/5 in R knee ext for improved performance of and tolerance with functional mobility.  Baseline:  Goal status: improving 7/31   PLAN:  PT FREQUENCY:   2-3x/wk  PT DURATION: 8 weeks  PLANNED INTERVENTIONS: 97164- PT Re-evaluation, 97110-Therapeutic exercises, 97530- Therapeutic activity, 97112- Neuromuscular re-education, 97535- Self Care, 02859- Manual therapy, [phone]- Gait training, [phone]- Aquatic Therapy, [phone]- Electrical stimulation (unattended), Patient/Family education, Balance training, Stair training, Taping, Dry Needling, Joint mobilization, Spinal mobilization, Scar mobilization, Cryotherapy, and Moist heat  PLAN FOR NEXT SESSION:  Cont with land based therapy focusing on LE and core strengthening.  Cont with gym exercises as appropriate if pt tolerates it well.  Cont with DN and exercises per protocol per pt tolerance.     Leigh Minerva III PT, DPT 10/01/23 6:37 AM

## 2023-09-30 ENCOUNTER — Ambulatory Visit (HOSPITAL_BASED_OUTPATIENT_CLINIC_OR_DEPARTMENT_OTHER): Attending: Orthopedic Surgery | Admitting: Physical Therapy

## 2023-09-30 ENCOUNTER — Encounter (HOSPITAL_BASED_OUTPATIENT_CLINIC_OR_DEPARTMENT_OTHER): Payer: Self-pay | Admitting: Physical Therapy

## 2023-09-30 DIAGNOSIS — M25551 Pain in right hip: Secondary | ICD-10-CM | POA: Insufficient documentation

## 2023-09-30 DIAGNOSIS — M25651 Stiffness of right hip, not elsewhere classified: Secondary | ICD-10-CM | POA: Diagnosis present

## 2023-09-30 DIAGNOSIS — R262 Difficulty in walking, not elsewhere classified: Secondary | ICD-10-CM | POA: Diagnosis present

## 2023-09-30 DIAGNOSIS — M6281 Muscle weakness (generalized): Secondary | ICD-10-CM | POA: Diagnosis present

## 2023-10-08 ENCOUNTER — Encounter (HOSPITAL_BASED_OUTPATIENT_CLINIC_OR_DEPARTMENT_OTHER): Payer: Self-pay | Admitting: Physical Therapy

## 2023-10-08 ENCOUNTER — Ambulatory Visit (HOSPITAL_BASED_OUTPATIENT_CLINIC_OR_DEPARTMENT_OTHER): Admitting: Physical Therapy

## 2023-10-08 DIAGNOSIS — R262 Difficulty in walking, not elsewhere classified: Secondary | ICD-10-CM

## 2023-10-08 DIAGNOSIS — M25651 Stiffness of right hip, not elsewhere classified: Secondary | ICD-10-CM

## 2023-10-08 DIAGNOSIS — M25551 Pain in right hip: Secondary | ICD-10-CM | POA: Diagnosis not present

## 2023-10-08 DIAGNOSIS — M6281 Muscle weakness (generalized): Secondary | ICD-10-CM

## 2023-10-08 NOTE — Therapy (Signed)
 OUTPATIENT PHYSICAL THERAPY LOWER EXTREMITY TREATMENT     Patient Name: [name] MRN: [id number] DOB:December 28, 1985, 38 y.o., female Today's Date: 10/08/2023  END OF SESSION:  PT End of Session - 10/08/23 1025     Visit Number 47    Number of Visits 51    Date for PT Re-Evaluation 10/17/23    Authorization Type UHC    PT Start Time 1022    PT Stop Time 1101    PT Time Calculation (min) 39 min    Activity Tolerance Patient tolerated treatment well    Behavior During Therapy WFL for tasks assessed/performed                                   Past Medical History:  Diagnosis Date   ACL graft tear (HCC)    left knee   Allergy     Angio-edema    Anxiety    Complication of anesthesia    woke up during surgery   Depression    Family history of adverse reaction to anesthesia     mom is difficult to put to sleep   IUD (intrauterine device) in place    Knee pain, right    Migraine headache    Sleep paralysis    Ulcerative proctitis (HCC)    Past Surgical History:  Procedure Laterality Date   ANTERIOR CRUCIATE LIGAMENT REPAIR Left 08/10/2014   Procedure: ANTERIOR CRUCIATE LIGAMENT (ACL) REPAIR;  Surgeon: Lamar Collet, MD;  Location: [facility] [place];  Service: Orthopedics;  Laterality: Left;   HIP SURGERY Right 08/25/2019   PAO surgery [facility] [address] Redell kerns   HIP SURGERY Bilateral 01/05/2021   [facility], had hardware taken out   KNEE ARTHROSCOPY Left 08/10/2014   Procedure: ARTHROSCOPY KNEE DEBRIDEMENT ALLOGRAFT ACL REVISION RECONSTRUCTION;  Surgeon: Lamar Collet, MD;  Location: [facility];  Service: Orthopedics;  Laterality: Left;   KNEE ARTHROSCOPY W/ ACL RECONSTRUCTION Left 2006   SPINAL FUSION     widom tooth extraction     only had 1 wisdom tooth    Patient Active Problem List   Diagnosis Date Noted   Ulcerative proctitis with rectal bleeding (HCC) 06/16/2020   Bloating 06/16/2020   Chronic migraine without aura  without status migrainosus, not intractable 08/19/2018   Migraine with aura and with status migrainosus, not intractable 08/19/2018   Pain in left shoulder 08/19/2018   Pain in left ankle and joints of left foot 05/09/2018   Pain in left hip 05/09/2018   Cervicalgia 05/09/2018   Chronic rhinitis 11/12/2017   Angioedema 11/12/2017   Allergic reaction 11/12/2017   Chronic sinusitis 11/12/2017   Cervical intraepithelial neoplasia grade 1 10/01/2017   GAD (generalized anxiety disorder) 09/17/2014   ACL graft tear (HCC) 08/10/2014   S/P ACL reconstruction 08/10/2014   Migraine headache 05/15/2010     REFERRING PROVIDER: kerns Redell Lenis, MD  REFERRING DIAG: [phone] (ICD-10-CM) - Status post hip surgery / s/p arthroscopy of hip  S32.691K (ICD-10-CM) - Other specified fracture of right ischium, subsequent encounter for fracture with nonunion  THERAPY DIAG:  Pain in right hip  Muscle weakness (generalized)  Difficulty in walking, not elsewhere classified  Stiffness of right hip, not elsewhere classified  Rationale for Evaluation and Treatment: Rehabilitation  ONSET DATE: DOS  12/27/2022   SUBJECTIVE:   SUBJECTIVE STATEMENT: Hip pain about 5/10 but I don't think it's the hip, I think its  endometriosis.  Pt has a follow up with MD on 8/20.    PERTINENT HISTORY: Hx of hip surgeries: -Revision R hip femoroplasty, arthroscopy with labral repair, open internal fixation post pelv ring Fx on 12/27/2022 -R hip arthroscopy with femoroplasty, labral repair, and pelvis osteotomy on 08/24/2019 (PAO)  -L hip labral repair and hip bone osteotomy, femoroplasty on 03/21/20 -hardware removal on 01/05/21 in bilat hips     L ACL repair x 2 in 06 and 2016 GAD Migraines Ulcerative colitis and celiac Mast cell activation syndrome Cervical fusion in 2020 and 2021   PAIN: Are you having any pain?  Yes  NPRS: 4/10    Location:  R anterior hip, R superior and central glute, piriformis, HS, /   PSIS area Type:  constant. Pt feels pulling and heaviness in anterior hip.  PRECAUTIONS: Other: per surgical protocol, R hip labral repair, L ACL repair x 2, cervical fusion x 2   WEIGHT BEARING RESTRICTIONS: none indicated on orders  FALLS:  Has patient fallen in last 6 months? No  LIVING ENVIRONMENT: Lives with: lives with their spouse Lives in:  2 story home but moving into a 1 story Stairs: yes   PLOF: Independent  PATIENT GOALS: to be pain-free, improve ROM, improve quality of life    OBJECTIVE:  Note: Objective measures were completed at Evaluation unless otherwise noted.  DIAGNOSTIC FINDINGS: Pelvis x ray on 04/05/23:   Findings/Impression:  Hardware: Right acetabular plate and screw fixation without complication. Alignment: No dislocation. Bones: No acute fracture. Healing osteotomy of the right superior pubic ramus root. Joints: Mild degenerative changes of the left hip. Soft Tissues: Unremarkable.    PATIENT SURVEYS:  LEFS Initial/current:  41/80 / 35/80 LEFS 6/9: 63/80   COGNITION: Overall cognitive status: Within functional limits for tasks assessed       LOWER EXTREMITY ROM:  Active ROM Right eval Left eval Right 4/22  Hip flexion 105 118 104  Hip extension     Hip abduction 22 30 28   Hip adduction     Hip internal rotation 42 24   Hip external rotation 24 40 32  Knee flexion     Knee extension     Ankle dorsiflexion     Ankle plantarflexion     Ankle inversion     Ankle eversion      (Blank rows = not tested)  LOWER EXTREMITY HHD:  Hip abd (seated):  R:  11.3, L:  16.9  LOWER EXTREMITY MMT:    MMT Right 5/21 Left 5/21 Right  6/25 Left 6/25  Hip flexion 11.6 18.6 22.3 29.6  Hip extension      Hip abduction 18.9 21.4 20.5 26.6  Hip adduction      Hip internal rotation      Hip external rotation      Knee flexion      Knee extension 23.8 32.6 36.3 44.3  Ankle dorsiflexion      Ankle plantarflexion      Ankle inversion       Ankle eversion       (Blank rows = not tested)   GAIT: Assistive device utilized: None Level of assistance: Complete Independence Comments:  Improved quality of gait with reduced limp though still favors R LE with gait.  Appears to have a R hip drop.  Decreased hip extension.  7/10 pain with ambulation.  TREATMENT DATE:   8/12 Roller bilat hamstrings and glutes Supine ab set for rib flare reduction Lt ASIS post rot spring mob in supine Prone Lt upper quadrant sacral PA sping mobs LT sideyling Rt LE LAD  8/4 Elliptical L2-3 x 6 mins Cybex Leg press  80# x12, 85# 2x12 Step up and drive 6 inch 7k84 Step downs 4 inch 2x10 Walking lunges x 2 laps 1D plyoback ball toss with 1.1# ball 3x15 Star reaches 2x5 reps Cable walks 25# x5, 30# x5 reps    7/30  There-ex: Cybex leg press 3x12 85 lbs   Neuro re-ed: Hop on and off air-ex 2x15 fwd and back  Step and drive 6 inch 7k84  Step down 4 inch 3x10 Cable walk x10 30 #       7/28 Elliptical L3 x 5 mins Cybex leg press 80# 2x15, 85# 2x 10-12 Walking lunges  2 sets Squats 2x10 with 10# KB Star reaches with taps 2x5 reps 1D plyoback ball toss 3x12 with 1.1# ball Birddogs on p-ball 2x10 Cable walks 25# x10     7/24 Elliptical L2-3 x 7 mins Knee extension machine  20# x15,11 Cybex leg press 80# 2x15, 85# x 10-12 Cable walks 20# x10, 25# x10  Walking lunges  2 sets Star reaches with taps 2x5 reps Birddogs on p-ball 2x10    PATIENT EDUCATION:  Education details:  POC, rationale of interventions, relevant anatomy, exercise form, and sx response.   Person educated: Patient Education method: Explanation, Demonstration, Tactile cues, Verbal cues, Education comprehension: verbalized understanding, returned demonstration, verbal cues required, tactile cues required, and needs further  education  HOME EXERCISE PROGRAM: Access Code: URL: [email or web address]   3-layer heel lift PRI Rt sidelying knee over knee (handout provided)   ASSESSMENT:  CLINICAL IMPRESSION: Pt reports that her general strength and activity tolerance has improved since beginning PT but continues to have high levels of pain. Plans to discuss hardware removal with MD on 8/20 at her F/u due to continued pain sitting directly on Rt ischial tuberosity. Generalized abdominal and hip pain from internal organs and looking for specialist.     OBJECTIVE IMPAIRMENTS: Abnormal gait, decreased activity tolerance, decreased endurance, decreased mobility, difficulty walking, decreased ROM, decreased strength, hypomobility, and pain.   ACTIVITY LIMITATIONS: lifting, bending, sitting, standing, squatting, stairs, transfers, and locomotion level  PARTICIPATION LIMITATIONS: cleaning, laundry, driving, shopping, and community activity  PERSONAL FACTORS: Time since onset of injury/illness/exacerbation and 3+ comorbidities: R hip labral repair, L ACL repair x 2, cervical fusion x 2 are also affecting patient's functional outcome.   REHAB POTENTIAL: Good  CLINICAL DECISION MAKING: Evolving/moderate complexity  EVALUATION COMPLEXITY: Moderate   GOALS:  SHORT TERM GOALS: Target date:  05/28/2023   Pt will be independent with HEP for improved pain, strength, tolerance to activity, and function.  Baseline: Goal status: met for short term  2.  Pt will tolerate aquatic therapy without adverse effects in order to perform exercises in a reduced stress environment and improve functional mobility, tolerance to activity, strength, and pain.  Baseline:  Goal status: MET 6/26 Target date:  05/21/2023   3.  Pt's worst pain will be no > than 8/10 for improved tolerance to activity and mobility.  Baseline: 5/10 Goal status: high levels of pain today 6/26  4.  Pt will report at least a 25%  improvement in her daily mobility.   Baseline:  Goal status: MET is doing more overall 6/26  5.  Pt will demonstrate  improved quality of gait including improved Wb'ing thru R LE, reduced hip drop, and reduced limp.  Baseline:  Goal status: MET  6.  Pt will perform a 6 inch step up with good form and stability without significant pain Baseline:  Goal status: MET -05/24/23 Target date:  06/04/2023  7.  Pt will ambulate with a normalized heel to toe gait pattern without limping.   Goal status:  MET    LONG TERM GOALS: Target date: 08/20/2023   Pt will ambulate extended community distance without significant pain and difficulty.  Baseline:  Goal status: ONGOING 6/26  2.  Pt will ascend and descend stairs with a reciprocal gait with a rail.  Baseline:  Goal status: working on stairs 6/25 3.  Pt will be able to walk her neighborhood without significant pain. Baseline:  Goal status: In progress has started walking but still painful 6/25  4.  Pt will be able to perform her ADLs and IADLs including household chores without significant pain and difficulty.  Baseline:  Goal status: continued pain 6/25  5.  Pt will squat with symmetrical Wb'ing in bilat LE's and good form without increased pain for improved functional strength.  Baseline:  Goal status: INITIAL  6.  Pt will demo 4 to 4+/5 strength in R hip abd, 4/5 in R hip flex, and 5/5 in R knee ext for improved performance of and tolerance with functional mobility.  Baseline:  Goal status: improving 7/31   PLAN:  PT FREQUENCY:   2-3x/wk  PT DURATION: 8 weeks  PLANNED INTERVENTIONS: 97164- PT Re-evaluation, 97110-Therapeutic exercises, 97530- Therapeutic activity, 97112- Neuromuscular re-education, 97535- Self Care, 02859- Manual therapy, [phone]- Gait training, [phone]- Aquatic Therapy, [phone]- Electrical stimulation (unattended), Patient/Family education, Balance training, Stair training, Taping, Dry Needling, Joint mobilization,  Spinal mobilization, Scar mobilization, Cryotherapy, and Moist heat  PLAN FOR NEXT SESSION:  Cont with land based therapy focusing on LE and core strengthening.  Cont with gym exercises as appropriate if pt tolerates it well.  Cont with DN and exercises per protocol per pt tolerance.     Leigh Minerva III PT, DPT 10/08/23 11:49 AM

## 2023-10-09 ENCOUNTER — Encounter (HOSPITAL_BASED_OUTPATIENT_CLINIC_OR_DEPARTMENT_OTHER): Payer: Self-pay | Admitting: Physical Therapy

## 2023-10-10 ENCOUNTER — Ambulatory Visit (HOSPITAL_BASED_OUTPATIENT_CLINIC_OR_DEPARTMENT_OTHER): Payer: Self-pay | Admitting: Physical Therapy

## 2023-10-10 ENCOUNTER — Encounter (HOSPITAL_BASED_OUTPATIENT_CLINIC_OR_DEPARTMENT_OTHER): Payer: Self-pay | Admitting: Physical Therapy

## 2023-10-10 DIAGNOSIS — M6281 Muscle weakness (generalized): Secondary | ICD-10-CM

## 2023-10-10 DIAGNOSIS — R262 Difficulty in walking, not elsewhere classified: Secondary | ICD-10-CM

## 2023-10-10 DIAGNOSIS — M25651 Stiffness of right hip, not elsewhere classified: Secondary | ICD-10-CM

## 2023-10-10 DIAGNOSIS — M25551 Pain in right hip: Secondary | ICD-10-CM | POA: Diagnosis not present

## 2023-10-10 NOTE — Therapy (Signed)
 OUTPATIENT PHYSICAL THERAPY LOWER EXTREMITY TREATMENT     Patient Name: [name] MRN: [id number] DOB:1985/11/29, 38 y.o., female Today's Date: 10/11/2023  END OF SESSION:  PT End of Session - 10/10/23 1206     Visit Number 48    Number of Visits 51    Date for PT Re-Evaluation 10/17/23    Authorization Type [employer]    PT Start Time 1202    PT Stop Time 1238    PT Time Calculation (min) 36 min    Activity Tolerance Patient tolerated treatment well    Behavior During Therapy WFL for tasks assessed/performed                                   Past Medical History:  Diagnosis Date   ACL graft tear (HCC)    left knee   Allergy     Angio-edema    Anxiety    Complication of anesthesia    woke up during surgery   Depression    Family history of adverse reaction to anesthesia     mom is difficult to put to sleep   IUD (intrauterine device) in place    Knee pain, right    Migraine headache    Sleep paralysis    Ulcerative proctitis (HCC)    Past Surgical History:  Procedure Laterality Date   ANTERIOR CRUCIATE LIGAMENT REPAIR Left 08/10/2014   Procedure: ANTERIOR CRUCIATE LIGAMENT (ACL) REPAIR;  Surgeon: Lamar Collet, MD;  Location: [facility] [place];  Service: Orthopedics;  Laterality: Left;   HIP SURGERY Right 08/25/2019   PAO surgery [facility] Redell kerns   HIP SURGERY Bilateral 01/05/2021   [facility], had hardware taken out   KNEE ARTHROSCOPY Left 08/10/2014   Procedure: ARTHROSCOPY KNEE DEBRIDEMENT ALLOGRAFT ACL REVISION RECONSTRUCTION;  Surgeon: Lamar Collet, MD;  Location: [facility];  Service: Orthopedics;  Laterality: Left;   KNEE ARTHROSCOPY W/ ACL RECONSTRUCTION Left 2006   SPINAL FUSION     widom tooth extraction     only had 1 wisdom tooth    Patient Active Problem List   Diagnosis Date Noted   Ulcerative proctitis with rectal bleeding (HCC) 06/16/2020   Bloating 06/16/2020   Chronic migraine without aura  without status migrainosus, not intractable 08/19/2018   Migraine with aura and with status migrainosus, not intractable 08/19/2018   Pain in left shoulder 08/19/2018   Pain in left ankle and joints of left foot 05/09/2018   Pain in left hip 05/09/2018   Cervicalgia 05/09/2018   Chronic rhinitis 11/12/2017   Angioedema 11/12/2017   Allergic reaction 11/12/2017   Chronic sinusitis 11/12/2017   Cervical intraepithelial neoplasia grade 1 10/01/2017   GAD (generalized anxiety disorder) 09/17/2014   ACL graft tear (HCC) 08/10/2014   S/P ACL reconstruction 08/10/2014   Migraine headache 05/15/2010     REFERRING PROVIDER: kerns Redell Lenis, MD  REFERRING DIAG: [phone] (ICD-10-CM) - Status post hip surgery / s/p arthroscopy of hip  S32.691K (ICD-10-CM) - Other specified fracture of right ischium, subsequent encounter for fracture with nonunion  THERAPY DIAG:  Pain in right hip  Muscle weakness (generalized)  Difficulty in walking, not elsewhere classified  Stiffness of right hip, not elsewhere classified  Rationale for Evaluation and Treatment: Rehabilitation  ONSET DATE: DOS  12/27/2022   SUBJECTIVE:   SUBJECTIVE STATEMENT: Pt denies any adverse effects prior Rx.  Pt states she felt the same  after prior Rx. Hip pain about 5/10 but I don't think it's the hip, I think its endometriosis.  Pt has a follow up with MD on 8/20.    PERTINENT HISTORY: Hx of hip surgeries: -Revision R hip femoroplasty, arthroscopy with labral repair, open internal fixation post pelv ring Fx on 12/27/2022 -R hip arthroscopy with femoroplasty, labral repair, and pelvis osteotomy on 08/24/2019 (PAO)  -L hip labral repair and hip bone osteotomy, femoroplasty on 03/21/20 -hardware removal on 01/05/21 in bilat hips     L ACL repair x 2 in 06 and 2016 GAD Migraines Ulcerative colitis and celiac Mast cell activation syndrome Cervical fusion in 2020 and 2021   PAIN: Are you having any pain?  Yes   NPRS: 5/10    Location:  R anterior hip, R superior and central glute, piriformis, HS, /  PSIS area Type:  constant. Pt feels pulling and heaviness in anterior hip.  PRECAUTIONS: Other: per surgical protocol, R hip labral repair, L ACL repair x 2, cervical fusion x 2   WEIGHT BEARING RESTRICTIONS: none indicated on orders  FALLS:  Has patient fallen in last 6 months? No  LIVING ENVIRONMENT: Lives with: lives with their spouse Lives in:  2 story home but moving into a 1 story Stairs: yes   PLOF: Independent  PATIENT GOALS: to be pain-free, improve ROM, improve quality of life    OBJECTIVE:  Note: Objective measures were completed at Evaluation unless otherwise noted.  DIAGNOSTIC FINDINGS: Pelvis x ray on 04/05/23:   Findings/Impression:  Hardware: Right acetabular plate and screw fixation without complication. Alignment: No dislocation. Bones: No acute fracture. Healing osteotomy of the right superior pubic ramus root. Joints: Mild degenerative changes of the left hip. Soft Tissues: Unremarkable.    PATIENT SURVEYS:  LEFS Initial/current:  41/80 / 35/80 LEFS 6/9: 63/80   COGNITION: Overall cognitive status: Within functional limits for tasks assessed       LOWER EXTREMITY ROM:  Active ROM Right eval Left eval Right 4/22  Hip flexion 105 118 104  Hip extension     Hip abduction 22 30 28   Hip adduction     Hip internal rotation 42 24   Hip external rotation 24 40 32  Knee flexion     Knee extension     Ankle dorsiflexion     Ankle plantarflexion     Ankle inversion     Ankle eversion      (Blank rows = not tested)  LOWER EXTREMITY HHD:  Hip abd (seated):  R:  11.3, L:  16.9  LOWER EXTREMITY MMT:    MMT Right 5/21 Left 5/21 Right  6/25 Left 6/25  Hip flexion 11.6 18.6 22.3 29.6  Hip extension      Hip abduction 18.9 21.4 20.5 26.6  Hip adduction      Hip internal rotation      Hip external rotation      Knee flexion      Knee extension  23.8 32.6 36.3 44.3  Ankle dorsiflexion      Ankle plantarflexion      Ankle inversion      Ankle eversion       (Blank rows = not tested)   GAIT: Assistive device utilized: None Level of assistance: Complete Independence Comments:  Improved quality of gait with reduced limp though still favors R LE with gait.  Appears to have a R hip drop.  Decreased hip extension.  7/10 pain with ambulation.  TREATMENT DATE:   8/14 Elliptical L2-3 x 6 mins Cable walks 25# x10, 30# x5 reps  Cybex Leg press  85# 2x12 Walking lunges x 3 laps 3D plyoback ball toss with 2.2# ball x10-15 Runner's step up on 6 inch step    8/12 Roller bilat hamstrings and glutes Supine ab set for rib flare reduction Lt ASIS post rot spring mob in supine Prone Lt upper quadrant sacral PA sping mobs LT sideyling Rt LE LAD  8/4 Elliptical L2-3 x 6 mins Cybex Leg press  80# x12, 85# 2x12 Step up and drive 6 inch [id number] Step downs 4 inch 2x10 Walking lunges x 2 laps 1D plyoback ball toss with 1.1# ball 3x15 Star reaches 2x5 reps Cable walks 25# x5, 30# x5 reps    7/30  There-ex: Cybex leg press 3x12 85 lbs   Neuro re-ed: Hop on and off air-ex 2x15 fwd and back  Step and drive 6 inch 7k84  Step down 4 inch 3x10 Cable walk x10 30 #       7/28 Elliptical L3 x 5 mins Cybex leg press 80# 2x15, 85# 2x 10-12 Walking lunges  2 sets Squats 2x10 with 10# KB Star reaches with taps 2x5 reps 1D plyoback ball toss 3x12 with 1.1# ball Birddogs on p-ball 2x10 Cable walks 25# x10     7/24 Elliptical L2-3 x 7 mins Knee extension machine  20# x15,11 Cybex leg press 80# 2x15, 85# x 10-12 Cable walks 20# x10, 25# x10  Walking lunges  2 sets Star reaches with taps 2x5 reps Birddogs on p-ball 2x10    PATIENT EDUCATION:  Education details:  POC, rationale of interventions,  relevant anatomy, exercise form, and sx response.   Person educated: Patient Education method: Explanation, Demonstration, Tactile cues, Verbal cues, Education comprehension: verbalized understanding, returned demonstration, verbal cues required, tactile cues required, and needs further education  HOME EXERCISE PROGRAM: Access Code: URL: [email or web address]   3-layer heel lift PRI Rt sidelying knee over knee (handout provided)   ASSESSMENT:  CLINICAL IMPRESSION: Pt continues to have pain with no significant change.  Pt had no c/o's of increased pain during exercises.  She has improved with tolerance to activity.  Pt performed exercises well without c/o's.  Pt responded well to Rx reporting no increased pain after Rx.  Pt has a follow up with MD on 8/20     OBJECTIVE IMPAIRMENTS: Abnormal gait, decreased activity tolerance, decreased endurance, decreased mobility, difficulty walking, decreased ROM, decreased strength, hypomobility, and pain.   ACTIVITY LIMITATIONS: lifting, bending, sitting, standing, squatting, stairs, transfers, and locomotion level  PARTICIPATION LIMITATIONS: cleaning, laundry, driving, shopping, and community activity  PERSONAL FACTORS: Time since onset of injury/illness/exacerbation and 3+ comorbidities: R hip labral repair, L ACL repair x 2, cervical fusion x 2 are also affecting patient's functional outcome.   REHAB POTENTIAL: Good  CLINICAL DECISION MAKING: Evolving/moderate complexity  EVALUATION COMPLEXITY: Moderate   GOALS:  SHORT TERM GOALS: Target date:  05/28/2023   Pt will be independent with HEP for improved pain, strength, tolerance to activity, and function.  Baseline: Goal status: met for short term  2.  Pt will tolerate aquatic therapy without adverse effects in order to perform exercises in a reduced stress environment and improve functional mobility, tolerance to activity, strength, and pain.  Baseline:  Goal  status: MET 6/26 Target date:  05/21/2023   3.  Pt's worst pain will be no > than 8/10 for improved tolerance to activity and mobility.  Baseline: 5/10 Goal status: high levels of pain today 6/26  4.  Pt will report at least a 25% improvement in her daily mobility.   Baseline:  Goal status: MET is doing more overall 6/26  5.  Pt will demonstrate improved quality of gait including improved Wb'ing thru R LE, reduced hip drop, and reduced limp.  Baseline:  Goal status: MET  6.  Pt will perform a 6 inch step up with good form and stability without significant pain Baseline:  Goal status: MET -05/24/23 Target date:  06/04/2023  7.  Pt will ambulate with a normalized heel to toe gait pattern without limping.   Goal status:  MET    LONG TERM GOALS: Target date: 08/20/2023   Pt will ambulate extended community distance without significant pain and difficulty.  Baseline:  Goal status: ONGOING 6/26  2.  Pt will ascend and descend stairs with a reciprocal gait with a rail.  Baseline:  Goal status: working on stairs 6/25 3.  Pt will be able to walk her neighborhood without significant pain. Baseline:  Goal status: In progress has started walking but still painful 6/25  4.  Pt will be able to perform her ADLs and IADLs including household chores without significant pain and difficulty.  Baseline:  Goal status: continued pain 6/25  5.  Pt will squat with symmetrical Wb'ing in bilat LE's and good form without increased pain for improved functional strength.  Baseline:  Goal status: INITIAL  6.  Pt will demo 4 to 4+/5 strength in R hip abd, 4/5 in R hip flex, and 5/5 in R knee ext for improved performance of and tolerance with functional mobility.  Baseline:  Goal status: improving 7/31   PLAN:  PT FREQUENCY:   2-3x/wk  PT DURATION: 8 weeks  PLANNED INTERVENTIONS: 97164- PT Re-evaluation, 97110-Therapeutic exercises, 97530- Therapeutic activity, 97112- Neuromuscular  re-education, 97535- Self Care, [id number]- Manual therapy, [phone]- Gait training, [phone]- Aquatic Therapy, [phone]- Electrical stimulation (unattended), Patient/Family education, Balance training, Stair training, Taping, Dry Needling, Joint mobilization, Spinal mobilization, Scar mobilization, Cryotherapy, and Moist heat  PLAN FOR NEXT SESSION:  Cont with land based therapy focusing on LE and core strengthening.  Cont with gym exercises as appropriate if pt tolerates it well.  Cont with DN and exercises per protocol per pt tolerance.     Leigh Minerva III PT, DPT 10/11/23 1:08 PM

## 2023-10-14 ENCOUNTER — Ambulatory Visit (HOSPITAL_BASED_OUTPATIENT_CLINIC_OR_DEPARTMENT_OTHER): Admitting: Physical Therapy

## 2023-10-16 ENCOUNTER — Encounter (HOSPITAL_BASED_OUTPATIENT_CLINIC_OR_DEPARTMENT_OTHER): Admitting: Physical Therapy

## 2023-10-19 NOTE — Therapy (Signed)
 OUTPATIENT PHYSICAL THERAPY LOWER EXTREMITY TREATMENT  / PROGRESS NOTE   Patient Name: [name] MRN: [id number] DOB:06/03/1985, 38 y.o., female Today's Date: 10/22/2023  END OF SESSION:  PT End of Session - 10/21/23 1503     Visit Number 49    Number of Visits 50    Date for PT Re-Evaluation 11/03/23    Authorization Type [employer]    PT Start Time 1413    PT Stop Time 1503    PT Time Calculation (min) 50 min    Activity Tolerance Patient tolerated treatment well    Behavior During Therapy WFL for tasks assessed/performed                                    Past Medical History:  Diagnosis Date   ACL graft tear (HCC)    left knee   Allergy     Angio-edema    Anxiety    Complication of anesthesia    woke up during surgery   Depression    Family history of adverse reaction to anesthesia     mom is difficult to put to sleep   IUD (intrauterine device) in place    Knee pain, right    Migraine headache    Sleep paralysis    Ulcerative proctitis (HCC)    Past Surgical History:  Procedure Laterality Date   ANTERIOR CRUCIATE LIGAMENT REPAIR Left 08/10/2014   Procedure: ANTERIOR CRUCIATE LIGAMENT (ACL) REPAIR;  Surgeon: Lamar Collet, MD;  Location: [facility];  Service: Orthopedics;  Laterality: Left;   HIP SURGERY Right 08/25/2019   PAO surgery [facility] Redell kerns   HIP SURGERY Bilateral 01/05/2021   [facility], had hardware taken out   KNEE ARTHROSCOPY Left 08/10/2014   Procedure: ARTHROSCOPY KNEE DEBRIDEMENT ALLOGRAFT ACL REVISION RECONSTRUCTION;  Surgeon: Lamar Collet, MD;  Location: [facility];  Service: Orthopedics;  Laterality: Left;   KNEE ARTHROSCOPY W/ ACL RECONSTRUCTION Left 2006   SPINAL FUSION     widom tooth extraction     only had 1 wisdom tooth    Patient Active Problem List   Diagnosis Date Noted   Ulcerative proctitis with rectal bleeding (HCC) 06/16/2020   Bloating 06/16/2020   Chronic  migraine without aura without status migrainosus, not intractable 08/19/2018   Migraine with aura and with status migrainosus, not intractable 08/19/2018   Pain in left shoulder 08/19/2018   Pain in left ankle and joints of left foot 05/09/2018   Pain in left hip 05/09/2018   Cervicalgia 05/09/2018   Chronic rhinitis 11/12/2017   Angioedema 11/12/2017   Allergic reaction 11/12/2017   Chronic sinusitis 11/12/2017   Cervical intraepithelial neoplasia grade 1 10/01/2017   GAD (generalized anxiety disorder) 09/17/2014   ACL graft tear (HCC) 08/10/2014   S/P ACL reconstruction 08/10/2014   Migraine headache 05/15/2010     REFERRING PROVIDER: kerns Redell Lenis, MD  REFERRING DIAG: [phone] (ICD-10-CM) - Status post hip surgery / s/p arthroscopy of hip  S32.691K (ICD-10-CM) - Other specified fracture of right ischium, subsequent encounter for fracture with nonunion  THERAPY DIAG:  Pain in right hip  Muscle weakness (generalized)  Difficulty in walking, not elsewhere classified  Stiffness of right hip, not elsewhere classified  Rationale for Evaluation and Treatment: Rehabilitation  ONSET DATE: DOS  12/27/2022   SUBJECTIVE:   SUBJECTIVE STATEMENT: I'm hurting.  Pt states she's been hurting the same for  the past 2 months.  Pt continues to have anterior hip pain and constant HS pain.  Pt saw MD last Friday.  He ordered a CAT scan to determine if the bone is healed.  If the bone is healed, he plans to remove her hardware.      Pt states she felt the same after prior Rx.  Pt states her pain stayed the same.  Pt states her strength has improved.  Pt states her walking and mobility are the same, no improvement.  Pt reports no improvement in pain.     PERTINENT HISTORY: Hx of hip surgeries: -Revision R hip femoroplasty, arthroscopy with labral repair, open internal fixation post pelv ring Fx on 12/27/2022 -R hip arthroscopy with femoroplasty, labral repair, and pelvis osteotomy on  08/24/2019 (PAO)  -L hip labral repair and hip bone osteotomy, femoroplasty on 03/21/20 -hardware removal on 01/05/21 in bilat hips     L ACL repair x 2 in 06 and 2016 GAD Migraines Ulcerative colitis and celiac Mast cell activation syndrome Cervical fusion in 2020 and 2021   PAIN: Are you having any pain?  Yes  NPRS: 6/10 current, 8/10 worst, 4/10 best Location:  R anterior hip, R superior and central glute, piriformis, HS, /  PSIS area Type:  constant. Pt feels pulling and heaviness in anterior hip.  PRECAUTIONS: Other: per surgical protocol, R hip labral repair, L ACL repair x 2, cervical fusion x 2   WEIGHT BEARING RESTRICTIONS: none indicated on orders  FALLS:  Has patient fallen in last 6 months? No  LIVING ENVIRONMENT: Lives with: lives with their spouse Lives in:  2 story home but moving into a 1 story Stairs: yes   PLOF: Independent  PATIENT GOALS: to be pain-free, improve ROM, improve quality of life    OBJECTIVE:  Note: Objective measures were completed at Evaluation unless otherwise noted.  DIAGNOSTIC FINDINGS: Pelvis x ray on 04/05/23:   Findings/Impression:  Hardware: Right acetabular plate and screw fixation without complication. Alignment: No dislocation. Bones: No acute fracture. Healing osteotomy of the right superior pubic ramus root. Joints: Mild degenerative changes of the left hip. Soft Tissues: Unremarkable.    PATIENT SURVEYS:  LEFS Initial/current:  41/80 / 35/80 LEFS 6/9: 63/80 LEFS:  10/22/23:  56/80   COGNITION: Overall cognitive status: Within functional limits for tasks assessed       LOWER EXTREMITY ROM:  Active ROM Right eval Left eval Right 4/22 Right 8/25  Hip flexion 105 118 104 104  Hip extension      Hip abduction 22 30 28    Hip adduction      Hip internal rotation 42 24  41  Hip external rotation 24 40 32 40  Knee flexion      Knee extension      Ankle dorsiflexion      Ankle plantarflexion      Ankle  inversion      Ankle eversion       (Blank rows = not tested)  LOWER EXTREMITY HHD:  Hip abd (seated):  R:  11.3, L:  16.9  LOWER EXTREMITY MMT:    MMT and HHD Right 5/21 Left 5/21 Right  6/25 Left 6/25 Right 8/25 Left 8/25  Hip flexion 11.6 18.6 22.3 29.6 33.3 ; 4/5 30.4  Hip extension        Hip abduction 18.9 21.4 20.5 26.6 20.4 ;4/5  34 / 5/5  Hip adduction        Hip internal rotation  Hip external rotation        Knee flexion        Knee extension 23.8 32.6 36.3 44.3 46.4  /  5/5 57.2  Ankle dorsiflexion        Ankle plantarflexion        Ankle inversion        Ankle eversion         (Blank rows = not tested)   GAIT: Assistive device utilized: None Level of assistance: Complete Independence Comments:  Pt demonstrates improved quality of gait.  She had no hip drop and good hip extension.  Pt has no limp with gait.  5/10 pain with ambulation.  Pt feels a pull in her anterior hip.  Stairs:  Pt ascended and descended stairs with a reciprocal gait without the rail.                                                                                                                                 TREATMENT DATE:   8/25 Reviewed current function, pain levels, response to prior Rx.  PT assessed gait, stairs, and strength.  Elliptical L3 x 3 mins Cable walks 25# x5, 30# x10 reps  Walking lunges x 3 laps Star reaches on floor x 5 reps and on airex 2x5 reps Runner's step up on 8 inch step  PT completed LEFS. See below for pt education.   8/14 Elliptical L2-3 x 6 mins Cable walks 25# x10, 30# x5 reps  Cybex Leg press  85# 2x12 Walking lunges x 3 laps 3D plyoback ball toss with 2.2# ball x10-15 Runner's step up on 6 inch step     PATIENT EDUCATION:  Education details:  POC, rationale of interventions, relevant anatomy, exercise form, objective findings and progress, goal progress and sx response.   Person educated: Patient Education method: Explanation,  Demonstration, Tactile cues, Verbal cues, Education comprehension: verbalized understanding, returned demonstration, verbal cues required, tactile cues required, and needs further education  HOME EXERCISE PROGRAM: Access Code: URL: [email or web address]   3-layer heel lift PRI Rt sidelying knee over knee (handout provided)   ASSESSMENT:  CLINICAL IMPRESSION: Pt continues to have the same pain and has no improvement with pain.  She reports improved strength though reports no recent functional improvement.  Pt continues to have pain with ambulation though demonstrates much improved gait overall.  She ambulates with a normalized heel to toe gait pattern without limping.  She continues to reports feeling a pull in her anterior hip with gait.  Pt ascended and descended stairs with a reciprocal gait with good control without the rail.  Pt demonstrates improved R hip flexion and R knee extension strength and no significant change in hip abduction strength as evidenced by HHD testing.  Pt continues to have pain with household chores.  Pt's LEFS has worsened by 7 points since prior testing.  Pt met LTG #2 and 5.  MD had ordered  a CT scan to determine the pt's next steps including possible removal of the hardware.  Pt will be seen for 1 more visit and then put on hold pending CT scan results and MD orders.        OBJECTIVE IMPAIRMENTS: Abnormal gait, decreased activity tolerance, decreased endurance, decreased mobility, difficulty walking, decreased ROM, decreased strength, hypomobility, and pain.   ACTIVITY LIMITATIONS: lifting, bending, sitting, standing, squatting, stairs, transfers, and locomotion level  PARTICIPATION LIMITATIONS: cleaning, laundry, driving, shopping, and community activity  PERSONAL FACTORS: Time since onset of injury/illness/exacerbation and 3+ comorbidities: R hip labral repair, L ACL repair x 2, cervical fusion x 2 are also affecting patient's functional  outcome.   REHAB POTENTIAL: Good  CLINICAL DECISION MAKING: Evolving/moderate complexity  EVALUATION COMPLEXITY: Moderate   GOALS:  SHORT TERM GOALS: Target date:  05/28/2023   Pt will be independent with HEP for improved pain, strength, tolerance to activity, and function.  Baseline: Goal status: met for short term  2.  Pt will tolerate aquatic therapy without adverse effects in order to perform exercises in a reduced stress environment and improve functional mobility, tolerance to activity, strength, and pain.  Baseline:  Goal status: MET 6/26 Target date:  05/21/2023   3.  Pt's worst pain will be no > than 8/10 for improved tolerance to activity and mobility.  Baseline:  Goal status: NOT MET  4.  Pt will report at least a 25% improvement in her daily mobility.   Baseline:  Goal status: MET is doing more overall 6/26  5.  Pt will demonstrate improved quality of gait including improved Wb'ing thru R LE, reduced hip drop, and reduced limp.  Baseline:  Goal status: MET  6.  Pt will perform a 6 inch step up with good form and stability without significant pain Baseline:  Goal status: MET -05/24/23 Target date:  06/04/2023  7.  Pt will ambulate with a normalized heel to toe gait pattern without limping.   Goal status:  MET    LONG TERM GOALS: Target date: 11/03/2023   Pt will ambulate extended community distance without significant pain and difficulty.  Baseline:  Goal status: ONGOING 6/26  2.  Pt will ascend and descend stairs with a reciprocal gait with a rail.  Baseline:  Goal status:  GOAL MET  8/25  3.  Pt will be able to walk her neighborhood without significant pain. Baseline:  Goal status: ONGOING  4.  Pt will be able to perform her ADLs and IADLs including household chores without significant pain and difficulty.  Baseline:  Goal status: NOT MET  5.  Pt will squat with symmetrical Wb'ing in bilat LE's and good form without increased pain for improved  functional strength.  Baseline:  Goal status: INITIAL  6.  Pt will demo 4 to 4+/5 strength in R hip abd, 4/5 in R hip flex, and 5/5 in R knee ext for improved performance of and tolerance with functional mobility.  Baseline:  Goal status: GOAL MET  8/25   PLAN:  PT FREQUENCY: 1 more visit  PT DURATION: 2 weeks  PLANNED INTERVENTIONS: 02835- PT Re-evaluation, 97110-Therapeutic exercises, 97530- Therapeutic activity, 97112- Neuromuscular re-education, 97535- Self Care, 02859- Manual therapy, [phone]- Gait training, [phone]- Aquatic Therapy, [phone]- Electrical stimulation (unattended), Patient/Family education, Balance training, Stair training, Taping, Dry Needling, Joint mobilization, Spinal mobilization, Scar mobilization, Cryotherapy, and Moist heat  PLAN FOR NEXT SESSION:  MD has ordered a CT scan.  Pt will be seen for 1  more visit and then put on hold pending CT scan results and MD orders.    Leigh Minerva III PT, DPT 10/22/23 8:09 PM

## 2023-10-21 ENCOUNTER — Ambulatory Visit (HOSPITAL_BASED_OUTPATIENT_CLINIC_OR_DEPARTMENT_OTHER): Admitting: Physical Therapy

## 2023-10-21 DIAGNOSIS — M25651 Stiffness of right hip, not elsewhere classified: Secondary | ICD-10-CM

## 2023-10-21 DIAGNOSIS — M25551 Pain in right hip: Secondary | ICD-10-CM | POA: Diagnosis not present

## 2023-10-21 DIAGNOSIS — R262 Difficulty in walking, not elsewhere classified: Secondary | ICD-10-CM

## 2023-10-21 DIAGNOSIS — M6281 Muscle weakness (generalized): Secondary | ICD-10-CM

## 2023-10-22 ENCOUNTER — Telehealth (HOSPITAL_BASED_OUTPATIENT_CLINIC_OR_DEPARTMENT_OTHER): Payer: Self-pay | Admitting: Psychiatry

## 2023-10-22 ENCOUNTER — Encounter (HOSPITAL_BASED_OUTPATIENT_CLINIC_OR_DEPARTMENT_OTHER): Payer: Self-pay | Admitting: Physical Therapy

## 2023-10-22 ENCOUNTER — Encounter (HOSPITAL_COMMUNITY): Payer: Self-pay

## 2023-10-22 DIAGNOSIS — Z91199 Patient's noncompliance with other medical treatment and regimen due to unspecified reason: Secondary | ICD-10-CM

## 2023-10-22 NOTE — Progress Notes (Signed)
 Patient is no-show today.  Tried to call her cell but voicemail is full and cannot leave message.

## 2023-10-24 ENCOUNTER — Other Ambulatory Visit: Payer: Self-pay | Admitting: Orthopedic Surgery

## 2023-10-24 ENCOUNTER — Ambulatory Visit (HOSPITAL_BASED_OUTPATIENT_CLINIC_OR_DEPARTMENT_OTHER): Admitting: Physical Therapy

## 2023-10-24 ENCOUNTER — Encounter (HOSPITAL_BASED_OUTPATIENT_CLINIC_OR_DEPARTMENT_OTHER): Payer: Self-pay | Admitting: Physical Therapy

## 2023-10-24 DIAGNOSIS — M25551 Pain in right hip: Secondary | ICD-10-CM | POA: Diagnosis not present

## 2023-10-24 DIAGNOSIS — M6281 Muscle weakness (generalized): Secondary | ICD-10-CM

## 2023-10-24 DIAGNOSIS — M25651 Stiffness of right hip, not elsewhere classified: Secondary | ICD-10-CM

## 2023-10-24 DIAGNOSIS — R262 Difficulty in walking, not elsewhere classified: Secondary | ICD-10-CM

## 2023-10-24 NOTE — Therapy (Signed)
 OUTPATIENT PHYSICAL THERAPY LOWER EXTREMITY TREATMENT  / PROGRESS NOTE   Patient Name: [name] MRN: [id number] DOB:[birth year], 38 y.o., female Today's Date: 10/24/2023  END OF SESSION:  PT End of Session - 10/24/23 1310     Visit Number 50    Number of Visits 50    Date for PT Re-Evaluation 11/03/23    Authorization Type [employer]    PT Start Time 1308    PT Stop Time 1342    PT Time Calculation (min) 34 min    Activity Tolerance Patient tolerated treatment well    Behavior During Therapy WFL for tasks assessed/performed                                    Past Medical History:  Diagnosis Date   ACL graft tear (HCC)    left knee   Allergy     Angio-edema    Anxiety    Complication of anesthesia    woke up during surgery   Depression    Family history of adverse reaction to anesthesia     mom is difficult to put to sleep   IUD (intrauterine device) in place    Knee pain, right    Migraine headache    Sleep paralysis    Ulcerative proctitis (HCC)    Past Surgical History:  Procedure Laterality Date   ANTERIOR CRUCIATE LIGAMENT REPAIR Left 08/10/2014   Procedure: ANTERIOR CRUCIATE LIGAMENT (ACL) REPAIR;  Surgeon: Lamar Collet, MD;  Location: [facility];  Service: Orthopedics;  Laterality: Left;   HIP SURGERY Right 08/25/2019   PAO surgery [facility] Redell kerns   HIP SURGERY Bilateral 01/05/2021   [facility], had hardware taken out   KNEE ARTHROSCOPY Left 08/10/2014   Procedure: ARTHROSCOPY KNEE DEBRIDEMENT ALLOGRAFT ACL REVISION RECONSTRUCTION;  Surgeon: Lamar Collet, MD;  Location[facility];  Service: Orthopedics;  Laterality: Left;   KNEE ARTHROSCOPY W/ ACL RECONSTRUCTION Left 2006   SPINAL FUSION     widom tooth extraction     only had 1 wisdom tooth    Patient Active Problem List   Diagnosis Date Noted   Ulcerative proctitis with rectal bleeding (HCC) 06/16/2020   Bloating 06/16/2020   Chronic  migraine without aura without status migrainosus, not intractable 08/19/2018   Migraine with aura and with status migrainosus, not intractable 08/19/2018   Pain in left shoulder 08/19/2018   Pain in left ankle and joints of left foot 05/09/2018   Pain in left hip 05/09/2018   Cervicalgia 05/09/2018   Chronic rhinitis 11/12/2017   Angioedema 11/12/2017   Allergic reaction 11/12/2017   Chronic sinusitis 11/12/2017   Cervical intraepithelial neoplasia grade 1 10/01/2017   GAD (generalized anxiety disorder) 09/17/2014   ACL graft tear (HCC) 08/10/2014   S/P ACL reconstruction 08/10/2014   Migraine headache 05/15/2010     REFERRING PROVIDER: kerns Redell Lenis, MD  REFERRING DIAG: [phone] (ICD-10-CM) - Status post hip surgery / s/p arthroscopy of hip  S32.691K (ICD-10-CM) - Other specified fracture of right ischium, subsequent encounter for fracture with nonunion  THERAPY DIAG:  Pain in right hip  Muscle weakness (generalized)  Difficulty in walking, not elsewhere classified  Stiffness of right hip, not elsewhere classified  Rationale for Evaluation and Treatment: Rehabilitation  ONSET DATE: DOS  12/27/2022   SUBJECTIVE:   SUBJECTIVE STATEMENT: Maybe not so bad today, about a 5, maybe even a  4. Hamstrings are super tight  Pt states she felt the same after prior Rx.  Pt states her pain stayed the same.  Pt states her strength has improved.  Pt states her walking and mobility are the same, no improvement.  Pt reports no improvement in pain.     PERTINENT HISTORY: Hx of hip surgeries: -Revision R hip femoroplasty, arthroscopy with labral repair, open internal fixation post pelv ring Fx on 12/27/2022 -R hip arthroscopy with femoroplasty, labral repair, and pelvis osteotomy on 08/24/2019 (PAO)  -L hip labral repair and hip bone osteotomy, femoroplasty on 03/21/20 -hardware removal on 01/05/21 in bilat hips     L ACL repair x 2 in 06 and 2016 GAD Migraines Ulcerative colitis  and celiac Mast cell activation syndrome Cervical fusion in 2020 and 2021   PAIN: Are you having any pain?  Yes  NPRS: 6/10 current, 8/10 worst, 4/10 best Location:  R anterior hip, R superior and central glute, piriformis, HS, /  PSIS area Type:  constant. Pt feels pulling and heaviness in anterior hip.  PRECAUTIONS: Other: per surgical protocol, R hip labral repair, L ACL repair x 2, cervical fusion x 2   WEIGHT BEARING RESTRICTIONS: none indicated on orders  FALLS:  Has patient fallen in last 6 months? No  LIVING ENVIRONMENT: Lives with: lives with their spouse Lives in:  2 story home but moving into a 1 story Stairs: yes   PLOF: Independent  PATIENT GOALS: to be pain-free, improve ROM, improve quality of life    OBJECTIVE:  Note: Objective measures were completed at Evaluation unless otherwise noted.  DIAGNOSTIC FINDINGS: Pelvis x ray on 04/05/23:   Findings/Impression:  Hardware: Right acetabular plate and screw fixation without complication. Alignment: No dislocation. Bones: No acute fracture. Healing osteotomy of the right superior pubic ramus root. Joints: Mild degenerative changes of the left hip. Soft Tissues: Unremarkable.    PATIENT SURVEYS:  LEFS Initial/current:  41/80 / 35/80 LEFS 6/9: 63/80 LEFS:  10/22/23:  56/80   COGNITION: Overall cognitive status: Within functional limits for tasks assessed       LOWER EXTREMITY ROM:  Active ROM Right eval Left eval Right 4/22 Right 8/25  Hip flexion 105 118 104 104  Hip extension      Hip abduction 22 30 28    Hip adduction      Hip internal rotation 42 24  41  Hip external rotation 24 40 32 40  Knee flexion      Knee extension      Ankle dorsiflexion      Ankle plantarflexion      Ankle inversion      Ankle eversion       (Blank rows = not tested)  LOWER EXTREMITY HHD:  Hip abd (seated):  R:  11.3, L:  16.9  LOWER EXTREMITY MMT:    MMT and HHD Right 5/21 Left 5/21 Right  6/25  Left 6/25 Right 8/25 Left 8/25  Hip flexion 11.6 18.6 22.3 29.6 33.3 ; 4/5 30.4  Hip extension        Hip abduction 18.9 21.4 20.5 26.6 20.4 ;4/5  34 / 5/5  Hip adduction        Hip internal rotation        Hip external rotation        Knee flexion        Knee extension 23.8 32.6 36.3 44.3 46.4  /  5/5 57.2  Ankle dorsiflexion  Ankle plantarflexion        Ankle inversion        Ankle eversion         (Blank rows = not tested)   GAIT: Assistive device utilized: None Level of assistance: Complete Independence Comments:  Pt demonstrates improved quality of gait.  She had no hip drop and good hip extension.  Pt has no limp with gait.  5/10 pain with ambulation.  Pt feels a pull in her anterior hip.  Stairs:  Pt ascended and descended stairs with a reciprocal gait without the rail.                                                                                                                                 TREATMENT DATE:   8/28 Prone IASTM hamstrings, STM glues & piriformis Supine lateral FA mobs, AP mobs in flexion +adduction STM hip flexors Post mobs at Lt ASIS & discussed self correction using Lt HS & Rt hip flexors Passive adductor and hamstring stretching  8/25 Reviewed current function, pain levels, response to prior Rx.  PT assessed gait, stairs, and strength.  Elliptical L3 x 3 mins Cable walks 25# x5, 30# x10 reps  Walking lunges x 3 laps Star reaches on floor x 5 reps and on airex 2x5 reps Runner's step up on 8 inch step  PT completed LEFS. See below for pt education.   8/14 Elliptical L2-3 x 6 mins Cable walks 25# x10, 30# x5 reps  Cybex Leg press  85# 2x12 Walking lunges x 3 laps 3D plyoback ball toss with 2.2# ball x10-15 Runner's step up on 6 inch step     PATIENT EDUCATION:  Education details:  POC, rationale of interventions, relevant anatomy, exercise form, objective findings and progress, goal progress and sx response.   Person  educated: Patient Education method: Explanation, Demonstration, Tactile cues, Verbal cues, Education comprehension: verbalized understanding, returned demonstration, verbal cues required, tactile cues required, and needs further education  HOME EXERCISE PROGRAM: Access Code: URL: [email or web address]   3-layer heel lift PRI Rt sidelying knee over knee (handout provided)   ASSESSMENT:  CLINICAL IMPRESSION: Pt placed on medical hold following appt today. Appts cancelled through 9/11- she will let usknow when CT is scheduled and, if we have to cancel more appts, we will start adding appts PRN.    OBJECTIVE IMPAIRMENTS: Abnormal gait, decreased activity tolerance, decreased endurance, decreased mobility, difficulty walking, decreased ROM, decreased strength, hypomobility, and pain.   ACTIVITY LIMITATIONS: lifting, bending, sitting, standing, squatting, stairs, transfers, and locomotion level  PARTICIPATION LIMITATIONS: cleaning, laundry, driving, shopping, and community activity  PERSONAL FACTORS: Time since onset of injury/illness/exacerbation and 3+ comorbidities: R hip labral repair, L ACL repair x 2, cervical fusion x 2 are also affecting patient's functional outcome.   REHAB POTENTIAL: Good  CLINICAL DECISION MAKING: Evolving/moderate complexity  EVALUATION COMPLEXITY: Moderate   GOALS:  SHORT TERM GOALS: Target date: POC  date  Pt will be independent with HEP for improved pain, strength, tolerance to activity, and function.  Baseline: Goal status: met for short term  2.  Pt will tolerate aquatic therapy without adverse effects in order to perform exercises in a reduced stress environment and improve functional mobility, tolerance to activity, strength, and pain.  Baseline:  Goal status: MET 6/26 Target date:  05/21/2023   3.  Pt's worst pain will be no > than 8/10 for improved tolerance to activity and mobility.  Baseline:  Goal status: NOT  MET  4.  Pt will report at least a 25% improvement in her daily mobility.   Baseline:  Goal status: MET is doing more overall 6/26  5.  Pt will demonstrate improved quality of gait including improved Wb'ing thru R LE, reduced hip drop, and reduced limp.  Baseline:  Goal status: MET  6.  Pt will perform a 6 inch step up with good form and stability without significant pain Baseline:  Goal status: MET -05/24/23 Target date:  06/04/2023  7.  Pt will ambulate with a normalized heel to toe gait pattern without limping.   Goal status:  MET    LONG TERM GOALS: Target date: 11/03/2023   Pt will ambulate extended community distance without significant pain and difficulty.  Baseline:  Goal status: ONGOING 6/26  2.  Pt will ascend and descend stairs with a reciprocal gait with a rail.  Baseline:  Goal status:  GOAL MET  8/25  3.  Pt will be able to walk her neighborhood without significant pain. Baseline:  Goal status: ONGOING  4.  Pt will be able to perform her ADLs and IADLs including household chores without significant pain and difficulty.  Baseline:  Goal status: NOT MET  5.  Pt will squat with symmetrical Wb'ing in bilat LE's and good form without increased pain for improved functional strength.  Baseline:  Goal status: INITIAL  6.  Pt will demo 4 to 4+/5 strength in R hip abd, 4/5 in R hip flex, and 5/5 in R knee ext for improved performance of and tolerance with functional mobility.  Baseline:  Goal status: GOAL MET  8/25   PLAN:  PT FREQUENCY: 1 more visit  PT DURATION: 2 weeks  PLANNED INTERVENTIONS: 02835- PT Re-evaluation, 97110-Therapeutic exercises, 97530- Therapeutic activity, 97112- Neuromuscular re-education, 97535- Self Care, 02859- Manual therapy, [phone]- Gait training, [phone]- Aquatic Therapy, [phone]- Electrical stimulation (unattended), Patient/Family education, Balance training, Stair training, Taping, Dry Needling, Joint mobilization, Spinal mobilization,  Scar mobilization, Cryotherapy, and Moist heat  PLAN FOR NEXT SESSION:  MD has ordered a CT scan.  Pt will be seen for 1 more visit and then put on hold pending CT scan results and MD orders.    Conchetta Lamia C. Chesni Vos PT, DPT 10/24/23 1:45 PM

## 2023-10-30 ENCOUNTER — Encounter (HOSPITAL_BASED_OUTPATIENT_CLINIC_OR_DEPARTMENT_OTHER): Admitting: Physical Therapy

## 2023-10-30 ENCOUNTER — Ambulatory Visit
Admission: RE | Admit: 2023-10-30 | Discharge: 2023-10-30 | Disposition: A | Discharge status: Home / Self Care | Source: Ambulatory Visit | Attending: Orthopedic Surgery | Admitting: Orthopedic Surgery

## 2023-10-30 DIAGNOSIS — M25551 Pain in right hip: Secondary | ICD-10-CM

## 2023-11-01 ENCOUNTER — Encounter (HOSPITAL_BASED_OUTPATIENT_CLINIC_OR_DEPARTMENT_OTHER): Admitting: Physical Therapy

## 2023-11-04 ENCOUNTER — Encounter (HOSPITAL_BASED_OUTPATIENT_CLINIC_OR_DEPARTMENT_OTHER): Admitting: Physical Therapy

## 2023-11-07 ENCOUNTER — Encounter (HOSPITAL_BASED_OUTPATIENT_CLINIC_OR_DEPARTMENT_OTHER): Admitting: Physical Therapy

## 2023-11-11 ENCOUNTER — Ambulatory Visit (HOSPITAL_BASED_OUTPATIENT_CLINIC_OR_DEPARTMENT_OTHER): Admitting: Physical Therapy

## 2023-11-13 ENCOUNTER — Encounter (HOSPITAL_BASED_OUTPATIENT_CLINIC_OR_DEPARTMENT_OTHER): Payer: Self-pay | Admitting: Physical Therapy

## 2023-11-14 ENCOUNTER — Encounter (HOSPITAL_BASED_OUTPATIENT_CLINIC_OR_DEPARTMENT_OTHER): Admitting: Physical Therapy

## 2023-11-18 ENCOUNTER — Encounter (HOSPITAL_BASED_OUTPATIENT_CLINIC_OR_DEPARTMENT_OTHER): Admitting: Physical Therapy

## 2023-11-21 ENCOUNTER — Encounter (HOSPITAL_BASED_OUTPATIENT_CLINIC_OR_DEPARTMENT_OTHER): Admitting: Physical Therapy

## 2023-12-22 ENCOUNTER — Other Ambulatory Visit: Payer: Self-pay | Admitting: Gastroenterology

## 2023-12-22 DIAGNOSIS — R140 Abdominal distension (gaseous): Secondary | ICD-10-CM

## 2023-12-22 DIAGNOSIS — K2970 Gastritis, unspecified, without bleeding: Secondary | ICD-10-CM

## 2024-01-26 ENCOUNTER — Other Ambulatory Visit: Payer: Self-pay | Admitting: Gastroenterology

## 2024-04-03 ENCOUNTER — Other Ambulatory Visit (HOSPITAL_COMMUNITY): Payer: Self-pay | Admitting: Psychiatry

## 2024-04-03 DIAGNOSIS — F411 Generalized anxiety disorder: Secondary | ICD-10-CM
